# Patient Record
Sex: Male | Born: 1995
Health system: Southern US, Community
[De-identification: ages and names within clinical notes are randomized; demographics above are authoritative.]

## PROBLEM LIST (undated history)

## (undated) DIAGNOSIS — N049 Nephrotic syndrome with unspecified morphologic changes: Secondary | ICD-10-CM

## (undated) DIAGNOSIS — N189 Chronic kidney disease, unspecified: Secondary | ICD-10-CM

## (undated) HISTORY — PX: TONSILLECTOMY: SUR1361

---
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
## 2016-11-19 ENCOUNTER — Emergency Department (HOSPITAL_COMMUNITY): Payer: Self-pay

## 2016-11-19 ENCOUNTER — Emergency Department (HOSPITAL_COMMUNITY)
Admission: EM | Admit: 2016-11-19 | Discharge: 2016-11-19 | Disposition: A | Discharge status: Home / Self Care | Payer: Self-pay | Attending: Physician Assistant | Admitting: Physician Assistant

## 2016-11-19 DIAGNOSIS — S62337A Displaced fracture of neck of fifth metacarpal bone, left hand, initial encounter for closed fracture: Secondary | ICD-10-CM | POA: Insufficient documentation

## 2016-11-19 DIAGNOSIS — Y999 Unspecified external cause status: Secondary | ICD-10-CM | POA: Insufficient documentation

## 2016-11-19 DIAGNOSIS — F17200 Nicotine dependence, unspecified, uncomplicated: Secondary | ICD-10-CM | POA: Insufficient documentation

## 2016-11-19 DIAGNOSIS — W2201XA Walked into wall, initial encounter: Secondary | ICD-10-CM | POA: Insufficient documentation

## 2016-11-19 DIAGNOSIS — Y929 Unspecified place or not applicable: Secondary | ICD-10-CM | POA: Insufficient documentation

## 2016-11-19 DIAGNOSIS — Y939 Activity, unspecified: Secondary | ICD-10-CM | POA: Insufficient documentation

## 2016-11-19 MED ORDER — OXYCODONE-ACETAMINOPHEN 5-325 MG PO TABS
1.0000 | ORAL_TABLET | Freq: Once | ORAL | Status: AC
  Administered 2016-11-19: 1 via ORAL
  Filled 2016-11-19: qty 1

## 2016-11-19 MED ORDER — TETANUS-DIPHTH-ACELL PERTUSSIS 5-2.5-18.5 LF-MCG/0.5 IM SUSP
0.5000 mL | Freq: Once | INTRAMUSCULAR | Status: DC
  Filled 2016-11-19: qty 0.5

## 2016-11-19 NOTE — ED Triage Notes (Signed)
Pt states he punched a wall a few days ago and now has swelling to his right hand. Pulse and sensation intact.

## 2016-11-19 NOTE — ED Notes (Signed)
Patient returned from xray.

## 2016-11-19 NOTE — Discharge Instructions (Signed)
Call for hand surgery follow up. Return with any concerns.

## 2016-11-19 NOTE — Progress Notes (Signed)
Orthopedic Tech Progress Note Patient Details:  Philip Trevino 16-Oct-1995 [id number]  Ortho Devices Type of Ortho Device: Ace wrap, Ulna gutter splint Ortho Device/Splint Location: RUE Ortho Device/Splint Interventions: Ordered, Application   Braulio Bosch 11/19/2016, 3:22 PM

## 2016-11-19 NOTE — ED Provider Notes (Signed)
[facility] DEPT Provider Note   CSN: [id number] Arrival date & time: 11/19/16  1310  By signing my name below, I, Levester Fresh, attest that this documentation has been prepared under the direction and in the presence of No att. providers found.  Electronically Signed: Levester Fresh, Scribe. 11/19/2016. 4:59 PM.  History   Chief Complaint Chief Complaint  Patient presents with  . Hand Pain   Philip Trevino is a 21 y.o. male who presents to the Emergency Department with complaints of right hand pain after punching a wall x3 days ago. Pt denies experiencing any other acute sx, including numbness or tingling. Pt is able to move fingers.   The history is provided by the patient. No language interpreter was used.   No past medical history on file.  There are no active problems to display for this patient.  No past surgical history on file.   Home Medications    Prior to Admission medications   Medication Sig Start Date End Date Taking? Authorizing Provider  ibuprofen (ADVIL,MOTRIN) 600 MG tablet Take 1 tab PO Q6h x 1-2 days then Q6h prn 10/16/13   Kristen Cardinal, NP    Family History No family history on file.  Social History Social History  Substance Use Topics  . Smoking status: Light Tobacco Smoker  . Smokeless tobacco: Not on file  . Alcohol use No     Allergies   Patient has no known allergies.   Review of Systems Review of Systems  Musculoskeletal: Positive for arthralgias and myalgias.  Neurological: Negative for numbness.     Physical Exam Updated Vital Signs BP (!) 106/58 (BP Location: Left Arm)   Pulse 62   Temp 98.1 F (36.7 C) (Oral)   Resp 16   SpO2 100%   Physical Exam  Constitutional: He is oriented to person, place, and time. He appears well-developed and well-nourished. No distress.  HENT:  Head: Normocephalic and atraumatic.  Cardiovascular: Normal rate.   Pulmonary/Chest: Effort normal.  Musculoskeletal:  Swelling to the right  4th and 5th digits. Fingers are neurovascularly intact.  Neurological: He is alert and oriented to person, place, and time.  Skin: Skin is warm and dry.  Psychiatric: He has a normal mood and affect.  Nursing note and vitals reviewed.  ED Treatments / Results  DIAGNOSTIC STUDIES: Oxygen Saturation is 100% on RA, nl by my interpretation.    COORDINATION OF CARE: 2:12 PM Discussed treatment plan with pt at bedside and pt agreed to plan. 2:15 PM Called for hand consult. 3:01 PM Called to follow-up with hand consult.  Labs (all labs ordered are listed, but only abnormal results are displayed) Labs Reviewed - No data to display  EKG  EKG Interpretation None      Radiology Dg Hand Complete Right  Result Date: 11/19/2016 CLINICAL DATA:  Pain after trauma. EXAM: RIGHT HAND - COMPLETE 3+ VIEW COMPARISON:  None. FINDINGS: There is an angulated mildly displaced fracture of the distal fifth metacarpal. IMPRESSION: Angulated mildly displaced fracture of the distal fifth metacarpal. Electronically Signed   By: Dorise Bullion III M.D   On: 11/19/2016 14:08    Procedures Procedures (including critical care time)  Medications Ordered in ED Medications  Tdap (BOOSTRIX) injection 0.5 mL (0.5 mLs Intramuscular Refused 11/19/16 1514)  oxyCODONE-acetaminophen (PERCOCET/ROXICET) 5-325 MG per tablet 1 tablet (1 tablet Oral Given 11/19/16 1514)    Initial Impression / Assessment and Plan / ED Course  I have reviewed the triage vital  signs and the nursing notes.  Pertinent labs & imaging results that were available during my care of the patient were reviewed by me and considered in my medical decision making (see chart for details).    I personally performed the services described in this documentation, which was scribed in my presence. The recorded information has been reviewed and is accurate.   Patient is well-appearing 21 year old male who patent punched a wall recently. Patient has swelling to  the fourth and fifth MCP. Suspect fracture. We'll imagine. Denies punching mouth.  Patient shows right fifth metacarpal fracture. Discussed with Dr. Lenon Curt was on hand. He recommends splint and follow-up. Patient has no abrasions or skin tears and denies fight bite,.      Final Clinical Impressions(s) / ED Diagnoses   Final diagnoses:  Closed displaced fracture of neck of fifth metacarpal bone of left hand, initial encounter    New Prescriptions Discharge Medication List as of 11/19/2016  3:56 PM       Jeffie Widdowson Julio Alm, MD 11/19/16 1659

## 2016-11-19 NOTE — ED Notes (Signed)
Patient states he punched a wall Sat . c/o pain and swelling in right hand. States he had been using ice without relief.

## 2016-11-19 NOTE — ED Notes (Signed)
Ortho paged. 

## 2017-05-01 ENCOUNTER — Encounter (HOSPITAL_COMMUNITY): Payer: Self-pay

## 2017-05-01 ENCOUNTER — Emergency Department (HOSPITAL_COMMUNITY)
Admission: EM | Admit: 2017-05-01 | Discharge: 2017-05-01 | Disposition: A | Discharge status: Home / Self Care | Payer: Self-pay | Attending: Emergency Medicine | Admitting: Emergency Medicine

## 2017-05-01 DIAGNOSIS — F17200 Nicotine dependence, unspecified, uncomplicated: Secondary | ICD-10-CM | POA: Insufficient documentation

## 2017-05-01 DIAGNOSIS — J069 Acute upper respiratory infection, unspecified: Secondary | ICD-10-CM | POA: Insufficient documentation

## 2017-05-01 NOTE — ED Provider Notes (Signed)
[facility] DEPT Provider Note   CSN: [id number] Arrival date & time: 05/01/17  1523     History   Chief Complaint Chief Complaint  Patient presents with  . URI    HPI Philip Trevino is a 21 y.o. male.  HPI   21 year old male presents today with complaints of nasal congestion.  Patient reports that Tuesday he developed nasal congestion, headache described as posterior that has remained persistent.  Patient notes fevers Tuesday, now having fevers in the evenings, none presently.  He denies any cough, sore throat, chest pain or shortness of breath.  Denies any abdominal pain nausea or vomiting.  Denies any rash.  No neck stiffness.  No other concerning signs or symptoms reported today.  She denies a history of asthma or use.  History reviewed. No pertinent past medical history.  There are no active problems to display for this patient.   History reviewed. No pertinent surgical history.     Home Medications    Prior to Admission medications   Medication Sig Start Date End Date Taking? Authorizing Provider  ibuprofen (ADVIL,MOTRIN) 600 MG tablet Take 1 tab PO Q6h x 1-2 days then Q6h prn 10/16/13   Kristen Cardinal, NP    Family History History reviewed. No pertinent family history.  Social History Social History  Substance Use Topics  . Smoking status: Light Tobacco Smoker  . Smokeless tobacco: Never Used  . Alcohol use No     Allergies   Patient has no known allergies.   Review of Systems Review of Systems  All other systems reviewed and are negative.    Physical Exam Updated Vital Signs BP 120/81 (BP Location: Left Arm)   Pulse 74   Temp 98.1 F (36.7 C) (Oral)   Resp 17   SpO2 100%   Physical Exam  Constitutional: He is oriented to person, place, and time. He appears well-developed and well-nourished.  HENT:  Head: Normocephalic and atraumatic.  Right Ear: Hearing, tympanic membrane, external ear and ear canal normal.  Left Ear: Hearing,  tympanic membrane, external ear and ear canal normal.  Mouth/Throat: Uvula is midline and oropharynx is clear and moist. No oral lesions. No trismus in the jaw. Normal dentition. No uvula swelling. No oropharyngeal exudate, posterior oropharyngeal edema, posterior oropharyngeal erythema or tonsillar abscesses.  Eyes: Pupils are equal, round, and reactive to light. Conjunctivae are normal. Right eye exhibits no discharge. Left eye exhibits no discharge. No scleral icterus.  Neck: Normal range of motion. No JVD present. No tracheal deviation present.  Cardiovascular: Normal rate, normal heart sounds and intact distal pulses.  Exam reveals no gallop and no friction rub.   No murmur heard. Pulmonary/Chest: Effort normal and breath sounds normal. No stridor. No respiratory distress. He has no wheezes. He has no rales. He exhibits no tenderness.  Neurological: He is alert and oriented to person, place, and time. Coordination normal.  Psychiatric: He has a normal mood and affect. His behavior is normal. Judgment and thought content normal.  Nursing note and vitals reviewed.    ED Treatments / Results  Labs (all labs ordered are listed, but only abnormal results are displayed) Labs Reviewed - No data to display  EKG  EKG Interpretation None       Radiology No results found.  Procedures Procedures (including critical care time)  Medications Ordered in ED Medications - No data to display   Initial Impression / Assessment and Plan / ED Course  I have reviewed the triage vital  signs and the nursing notes.  Pertinent labs & imaging results that were available during my care of the patient were reviewed by me and considered in my medical decision making (see chart for details).      Final Clinical Impressions(s) / ED Diagnoses   Final diagnoses:  Viral upper respiratory tract infection    Labs:   Imaging:  Consults:  Therapeutics:  Discharge Meds:   Assessment/Plan:    21 year old male presents today with likely viral URI.  He is well-appearing in no acute distress, afebrile.  Symptomatic care instructions given strict return precautions given.  He verbalized understanding and agreement to today's plan had no further questions or concerns at time discharge.      New Prescriptions New Prescriptions   No medications on file     Okey Regal, Hershal Coria 05/01/17 1810    Okey Regal, PA-C 05/01/17 1811    Sherwood Gambler, MD 05/02/17 [phone]

## 2017-05-01 NOTE — ED Triage Notes (Signed)
Pt reports URI symptoms, generalized body aches, headache, chills and possible fever X2 days. Denies sore throat. Reports some nasal congestion. Resp e/u, no distress.

## 2017-05-01 NOTE — Discharge Instructions (Signed)
Please read attached information. If you experience any new or worsening signs or symptoms please return to the emergency room for evaluation. Please follow-up with your primary care provider or specialist as discussed.  Please use the Nettie pot as needed for nasal congestion.

## 2017-05-01 NOTE — ED Notes (Signed)
Patient complaining of nasal congestion, headache, chills, and body aches.  Denies cough, sore throat.  No respiratory distress. Afebrile at the time.

## 2017-05-19 ENCOUNTER — Emergency Department (HOSPITAL_COMMUNITY): Payer: Self-pay

## 2017-05-19 ENCOUNTER — Emergency Department (HOSPITAL_COMMUNITY)
Admission: EM | Admit: 2017-05-19 | Discharge: 2017-05-19 | Disposition: A | Discharge status: Home / Self Care | Payer: Self-pay | Attending: Emergency Medicine | Admitting: Emergency Medicine

## 2017-05-19 ENCOUNTER — Encounter (HOSPITAL_COMMUNITY): Payer: Self-pay | Admitting: Emergency Medicine

## 2017-05-19 DIAGNOSIS — Z79899 Other long term (current) drug therapy: Secondary | ICD-10-CM | POA: Insufficient documentation

## 2017-05-19 DIAGNOSIS — F17210 Nicotine dependence, cigarettes, uncomplicated: Secondary | ICD-10-CM | POA: Insufficient documentation

## 2017-05-19 DIAGNOSIS — R222 Localized swelling, mass and lump, trunk: Secondary | ICD-10-CM | POA: Insufficient documentation

## 2017-05-19 NOTE — ED Triage Notes (Signed)
Pt to ER for evaluation of right chest pain, states "there is a lump in my muscle," states has been bothering him for some time but has gotten worse. States pain with palpation and movement. All VSS. A/o x4. NAD.

## 2017-05-19 NOTE — Discharge Instructions (Signed)
An order has been placed in the computer for an ultrasound of the right side of the chest.  This will be done at [facility].  Call the number provided to set up an appointment. Please go to the primary care appointment set up for you at the [facility] family medicine center on November 2 at 10 AM. You will not get your results from the ultrasound until at least this visit. Please call the family medicine center after you get the ultrasound to make sure they know to look for that imaging result.   May take ibuprofen, naproxen, or Tylenol for pain.

## 2017-05-19 NOTE — Discharge Planning (Signed)
Murrell Dome J. Clydene Laming, RN, BSN, [place]  [facility] set up appointment with Philip Fail, PA-C at [facility] on 11/2 @10 :00.  Spoke with pt at bedside and advised to please arrive 15 min early and take a picture ID and your current medications.  Pt verbalizes understanding of keeping appointment.

## 2017-05-19 NOTE — ED Notes (Signed)
Case mx  Finding pt a PCP so  They can f/u with the [place]

## 2017-05-19 NOTE — ED Provider Notes (Signed)
[facility] EMERGENCY DEPARTMENT Provider Note   CSN: [id number] Arrival date & time: 05/19/17  7628     History   Chief Complaint Chief Complaint  Patient presents with  . Chest Pain    Pectoral lump?    HPI Philip Trevino is a 21 y.o. male.  HPI   Philip Trevino is a 21 y.o. male, patient with no pertinent past medical history, presenting to the ED with a painful area in the right pectoral region. States he first noticed it around January 2018. States it has been slowly growing since then. Became tender to the touch last night.  Denies fever/chills, N/V, night sweats, shortness of breath, cough, nipple discharge, changes in skin texture, or any other complaints. Denies anabolic steroid use, hormonal therapy use, IV drug use, or HIV.  No personal history of cancer.  History reviewed. No pertinent past medical history.  There are no active problems to display for this patient.   History reviewed. No pertinent surgical history.     Home Medications    Prior to Admission medications   Medication Sig Start Date End Date Taking? Authorizing Provider  ibuprofen (ADVIL,MOTRIN) 600 MG tablet Take 1 tab PO Q6h x 1-2 days then Q6h prn 10/16/13   Kristen Cardinal, NP    Family History History reviewed. No pertinent family history.  Social History Social History  Substance Use Topics  . Smoking status: Light Tobacco Smoker  . Smokeless tobacco: Never Used  . Alcohol use No     Allergies   Patient has no known allergies.   Review of Systems Review of Systems  Constitutional: Negative for chills, diaphoresis and fever.  Respiratory: Negative for cough and shortness of breath.   Gastrointestinal: Negative for nausea and vomiting.  Skin:       Right pectoral mass     Physical Exam Updated Vital Signs BP 107/70 (BP Location: Right Arm)   Pulse (!) 50   Temp 98.3 F (36.8 C) (Oral)   Resp 18   SpO2 100%   Physical Exam  Constitutional: He  appears well-developed and well-nourished. No distress.  HENT:  Head: Normocephalic and atraumatic.  Eyes: Conjunctivae are normal.  Neck: Neck supple.  Cardiovascular: Normal rate and regular rhythm.   Pulmonary/Chest: Effort normal and breath sounds normal. No respiratory distress.    Musculoskeletal: He exhibits tenderness.  Full range of motion through the cardinal directions of the right shoulder.  Lymphadenopathy:    He has no axillary adenopathy.       Right axillary: No pectoral and no lateral adenopathy present.  Neurological: He is alert.  Skin: Skin is warm and dry. He is not diaphoretic. No pallor.  Psychiatric: He has a normal mood and affect. His behavior is normal.  Nursing note and vitals reviewed.    ED Treatments / Results  Labs (all labs ordered are listed, but only abnormal results are displayed) Labs Reviewed - No data to display  EKG  EKG Interpretation None       Radiology Dg Chest 2 View  Result Date: 05/19/2017 CLINICAL DATA:  Chest pain EXAM: CHEST  2 VIEW COMPARISON:  None FINDINGS: The heart size and mediastinal contours are within normal limits. Both lungs are clear. The visualized skeletal structures are unremarkable. IMPRESSION: No active cardiopulmonary disease. Electronically Signed   By: Kerby Moors M.D.   On: 05/19/2017 10:45    Procedures [place] bedside Date/Time: 05/19/2017 11:02 AM Performed by: Lorayne Bender Authorized by: Caryl Asp  Brysyn Brandenberger C  Consent: Verbal consent obtained. Risks and benefits: risks, benefits and alternatives were discussed Consent given by: patient Patient identity confirmed: verbally with patient and provided demographic data Local anesthesia used: no  Anesthesia: Local anesthesia used: no  Sedation: Patient sedated: no Patient tolerance: Patient tolerated the procedure well with no immediate complications Comments: Superficial ultrasound of the right chest.  No fluid collection or evidence of cellulitis  noted.  There is an abnormal, flat mass noted on the ultrasound measuring approximately 2.5 cm x 2.5 cm.    (including critical care time)  Medications Ordered in ED Medications - No data to display   Initial Impression / Assessment and Plan / ED Course  I have reviewed the triage vital signs and the nursing notes.  Pertinent labs & imaging results that were available during my care of the patient were reviewed by me and considered in my medical decision making (see chart for details).     Patient presents with a painful right superficial chest mass.  Doubt abscess or cellulitis.  Mass is suspicious enough to warrant further imaging.  Order placed for superficial chest ultrasound.  Case management was utilized to set up an appointment for the patient with primary care for further management of this issue and review of imaging.  Instructions for imaging as well as primary care follow-up were explicitly discussed with the patient by myself as well as the case manager.  Patient voiced understanding.  Final Clinical Impressions(s) / ED Diagnoses   Final diagnoses:  Mass of chest wall, right    New Prescriptions New Prescriptions   No medications on file     Layla Maw 05/19/17 1134    Mesner, Corene Cornea, MD 05/20/17 [phone]

## 2017-05-19 NOTE — ED Notes (Signed)
Pt states he has rt breast pain and swelling, , states lifts heavy things at work, states swelling has been going on for a while/ months

## 2017-05-23 ENCOUNTER — Encounter (INDEPENDENT_AMBULATORY_CARE_PROVIDER_SITE_OTHER): Payer: Self-pay | Admitting: Physician Assistant

## 2017-05-23 ENCOUNTER — Ambulatory Visit (INDEPENDENT_AMBULATORY_CARE_PROVIDER_SITE_OTHER): Payer: Self-pay | Admitting: Physician Assistant

## 2017-05-23 VITALS — BP 104/67 | HR 65 | Temp 98.0°F | Resp 18 | Ht 69.0 in | Wt 145.0 lb

## 2017-05-23 DIAGNOSIS — N62 Hypertrophy of breast: Secondary | ICD-10-CM

## 2017-05-23 DIAGNOSIS — Z23 Encounter for immunization: Secondary | ICD-10-CM

## 2017-05-23 NOTE — Progress Notes (Signed)
   Subjective:  Patient ID: Philip Trevino, male    DOB: 1995/09/18  Age: 21 y.o. MRN: [id number]  CC: hospital f/u  HPI Philip Trevino is a 21 y.o. male with a medical history of breast mass presents on ED visit follow up. Went to ED on 04/2917 with complaint of tender breast mass on right nipple. Had been to the ED previously in 2016 with the same complaint. Believes the mass may be growing. Mass is tender to palpation. Does not endorse nipple discharge, malaise, fever, weight loss, or night sweats. Had an [place] ordered but has not been able to have it done due to financial issues.     Outpatient Medications Prior to Visit  Medication Sig Dispense Refill  . ibuprofen (ADVIL,MOTRIN) 600 MG tablet Take 1 tab PO Q6h x 1-2 days then Q6h prn 30 tablet 0   No facility-administered medications prior to visit.      ROS Review of Systems  Constitutional: Negative for chills, fever and malaise/fatigue.  Eyes: Negative for blurred vision.  Respiratory: Negative for shortness of breath.   Cardiovascular: Negative for chest pain and palpitations.  Gastrointestinal: Negative for abdominal pain and nausea.  Genitourinary: Negative for dysuria and hematuria.  Musculoskeletal: Negative for joint pain and myalgias.  Skin: Negative for rash.  Neurological: Negative for tingling and headaches.  Psychiatric/Behavioral: Negative for depression. The patient is not nervous/anxious.     Objective:  BP 104/67 (BP Location: Right Arm, Patient Position: Sitting, Cuff Size: Normal)   Pulse 65   Temp 98 F (36.7 C) (Oral)   Resp 18   Ht 5\' 9"  (1.753 m)   Wt 145 lb (65.8 kg)   SpO2 97%   BMI 21.41 kg/m   BP/Weight 05/23/2017 05/19/2017 25/63/8937  Systolic BP 342 876 811  Diastolic BP 67 80 80  Wt. (Lbs) 145 - -  BMI 21.41 - -      Physical Exam  Constitutional: He is oriented to person, place, and time.  Well developed, well nourished, NAD, polite  HENT:  Head: Normocephalic and atraumatic.   Eyes: No scleral icterus.  Neck: Normal range of motion. Neck supple. No thyromegaly present.  Pulmonary/Chest: Effort normal.  Musculoskeletal: He exhibits no edema.  Neurological: He is alert and oriented to person, place, and time.  Skin: Skin is warm and dry. No rash noted. No erythema. No pallor.  Firm, rubbery, freely movable nodule under right nipple, approximately 2 cm in size  Psychiatric: He has a normal mood and affect. His behavior is normal. Thought content normal.  Vitals reviewed.    Assessment & Plan:     1. Gynecomastia - Testosterone; Future - FSH/LH; Future - HCG, Tumor Marker; Future - [place] CHEST SOFT TISSUE; Future  2. Need for prophylactic vaccination and inoculation against influenza - Flu Vaccine QUAD 6+ mos PF IM (Fluarix Quad PF)  3. Need for Tdap vaccination - Tdap vaccine greater than or equal to 7yo IM    Follow-up: Return if symptoms worsen or fail to improve.   Clent Demark PA

## 2017-05-23 NOTE — Patient Instructions (Signed)
Gynecomastia, Adult Gynecomastia is an overgrowth of gland tissue in a man's breasts. This may cause one or both breasts to become enlarged. This often develops in men who have an imbalance of the male sex hormone (testosterone) and the male sex hormone (estrogen). This means that a man may have too much estrogen, too little testosterone, or both. Gynecomastia may be a normal part of aging for some men. It can also happen to adolescent boys during puberty. What are the causes? Gynecomastia may be caused by:  Certain medicines, such as: ? Estrogen supplements and medicines that act like estrogen in the body. ? Medicines that keep testosterone from functioning normally in the body (testosterone-inhibiting drugs). ? Anabolic steroids. ? Medicines to treat heartburn, cancer, heart disease, mental health problems, HIV (human immunodeficiency virus) or AIDS (acquired immunodeficiency syndrome). ? Antibiotic medicine. ? Chemotherapy medicine.  Recreational drugs, including alcohol, marijuana, and opioids.  Herbal products, including lavender and tea tree oil.  A gene that is passed along from parent to child (inherited).  Tumors in the pituitary or adrenal gland.  An overactive thyroid gland.  Certain inherited disorders, including a genetic disease that causes low testosterone in males (Klinefelter syndrome).  Cancer of the lung, kidney, liver, testicle, or gastrointestinal tract.  Conditions that cause liver or kidney failure.  Poor nutrition and starvation.  Testicle shrinking or failure (testicularatrophy).  In some cases, the cause may not be known. What increases the risk? You may have a higher risk for gynecomastia if you:  Are 26 years old or older.  Are overweight.  Abuse alcohol or other drugs.  Have a family history of gynecomastia.  What are the signs or symptoms?  Most of the time, breast enlargement is the only symptom. The enlargement may start near the  nipple, and the breast tissue may feel firm and rubbery. The breast may feel itchy, painful or tender. How is this diagnosed? This condition may be diagnosed based on:  Your symptoms.  Your medical history.  A physical exam.  Imaging tests, such as: ? An ultrasound. ? A mammogram. ? An MRI.  Blood tests.  Removal of a sample of breast tissue to be tested in a lab (biopsy).  How is this treated? Gynecomastia may go away on its own, without treatment. If gynecomastia is caused by a medical problem or drug abuse, treatment may include:  Getting treatment for the underlying medical problem or for drug abuse.  Changing or stopping medicines.  Medicines to block the effects of estrogen.  Taking a testosterone replacement.  Surgery to remove breast tissue or any lumps in your breasts.  Breast reduction surgery. This may be a possibility if you have severe or painful gynecomastia.  Follow these instructions at home:  Take over-the-counter and prescription medicines only as told by your health care provider.  Talk to your health care provider before taking any herbal medicines or diet supplements.  Do not abuse drugs or alcohol.  Keep all follow-up visits as told by your health care provider. This is important. Contact a health care provider if:  Your breast tissue grows larger or gets more swollen or painful.  You have a lump in your testicle.  You have blood or discharge coming from your nipples.  Your nipple changes shape.  You develop a hard or painful lump in your breast. This information is not intended to replace advice given to you by your health care provider. Make sure you discuss any questions you have with your  health care provider. Document Released: 09/01/2015 Document Revised: 12/15/2015 Document Reviewed: 09/01/2015 Elsevier Interactive Patient Education  [employer].

## 2017-05-28 ENCOUNTER — Ambulatory Visit (HOSPITAL_COMMUNITY): Payer: Self-pay

## 2017-08-06 ENCOUNTER — Emergency Department (HOSPITAL_COMMUNITY)
Admission: EM | Admit: 2017-08-06 | Discharge: 2017-08-06 | Disposition: A | Discharge status: Home / Self Care | Payer: Self-pay | Attending: Emergency Medicine | Admitting: Emergency Medicine

## 2017-08-06 ENCOUNTER — Encounter (HOSPITAL_COMMUNITY): Payer: Self-pay | Admitting: Emergency Medicine

## 2017-08-06 DIAGNOSIS — Z79899 Other long term (current) drug therapy: Secondary | ICD-10-CM | POA: Insufficient documentation

## 2017-08-06 DIAGNOSIS — K30 Functional dyspepsia: Secondary | ICD-10-CM

## 2017-08-06 DIAGNOSIS — F17200 Nicotine dependence, unspecified, uncomplicated: Secondary | ICD-10-CM | POA: Insufficient documentation

## 2017-08-06 DIAGNOSIS — R1013 Epigastric pain: Secondary | ICD-10-CM | POA: Insufficient documentation

## 2017-08-06 LAB — COMPREHENSIVE METABOLIC PANEL
ALK PHOS: 55 U/L (ref 38–126)
ALT: 21 U/L (ref 17–63)
ANION GAP: 8 (ref 5–15)
AST: 24 U/L (ref 15–41)
Albumin: 3.9 g/dL (ref 3.5–5.0)
BUN: 8 mg/dL (ref 6–20)
CALCIUM: 8.9 mg/dL (ref 8.9–10.3)
CHLORIDE: 107 mmol/L (ref 101–111)
CO2: 25 mmol/L (ref 22–32)
Creatinine, Ser: 1.06 mg/dL (ref 0.61–1.24)
GFR calc Af Amer: >60 mL/min (ref >60)
GFR calc non Af Amer: >60 mL/min (ref >60)
Glucose, Bld: 88 mg/dL (ref 65–99)
Potassium: 4.3 mmol/L (ref 3.5–5.1)
SODIUM: 140 mmol/L (ref 135–145)
Total Bilirubin: 0.6 mg/dL (ref 0.3–1.2)
Total Protein: 6.1 g/dL — ABNORMAL LOW (ref 6.5–8.1)

## 2017-08-06 LAB — CBC
HCT: 43.3 % (ref 39.0–52.0)
Hemoglobin: 15.5 g/dL (ref 13.0–17.0)
MCH: 30.6 pg (ref 26.0–34.0)
MCHC: 35.8 g/dL (ref 30.0–36.0)
MCV: 85.6 fL (ref 78.0–100.0)
Platelets: 290 10*3/uL (ref 150–400)
RBC: 5.06 MIL/uL (ref 4.22–5.81)
RDW: 12.1 % (ref 11.5–15.5)
WBC: 8.5 10*3/uL (ref 4.0–10.5)

## 2017-08-06 LAB — LIPASE, BLOOD: LIPASE: 26 U/L (ref 11–51)

## 2017-08-06 MED ORDER — GI COCKTAIL ~~LOC~~
30.0000 mL | Freq: Once | ORAL | Status: AC
  Administered 2017-08-06: 30 mL via ORAL
  Filled 2017-08-06: qty 30

## 2017-08-06 MED ORDER — FAMOTIDINE 20 MG PO TABS: 20.0000 mg | ORAL_TABLET | Freq: Two times a day (BID) | ORAL | 0 refills | Status: DC

## 2017-08-06 NOTE — ED Provider Notes (Signed)
[facility] EMERGENCY DEPARTMENT Provider Note   CSN: [id number] Arrival date & time: 08/06/17  2103     History   Chief Complaint Chief Complaint  Patient presents with  . Abdominal Pain    HPI Philip Trevino is a 22 y.o. male without significant past medical history, presenting to the ED with acute onset of intermittent epigastric abdominal pain, described as a bloating and gas feeling that began this morning.  Patient reports associated belching, decreased appetite, and an episode of nonbloody, nonbilious emesis.  Patient states he has been eating more spicy and greasy/fatty foods lately.  No medications tried for symptoms.  No history of PUD.  No history of abdominal surgeries.  Reports only occasional alcohol use, but does not take NSAIDs, and smokes black and milds daily.   The history is provided by the patient.    History reviewed. No pertinent past medical history.  There are no active problems to display for this patient.   History reviewed. No pertinent surgical history.     Home Medications    Prior to Admission medications   Medication Sig Start Date End Date Taking? Authorizing Provider  famotidine (PEPCID) 20 MG tablet Take 1 tablet (20 mg total) by mouth 2 (two) times daily. 08/06/17   Uchechukwu Dhawan, [place] N, PA-C  ibuprofen (ADVIL,MOTRIN) 600 MG tablet Take 1 tab PO Q6h x 1-2 days then Q6h prn 10/16/13   Kristen Cardinal, NP    Family History No family history on file.  Social History Social History   Tobacco Use  . Smoking status: Light Tobacco Smoker  . Smokeless tobacco: Never Used  Substance Use Topics  . Alcohol use: No  . Drug use: No     Allergies   Patient has no known allergies.   Review of Systems Review of Systems  Constitutional: Positive for appetite change. Negative for fever.  Gastrointestinal: Positive for abdominal pain and vomiting. Negative for blood in stool, constipation and nausea.  Genitourinary: Negative  for dysuria.  All other systems reviewed and are negative.    Physical Exam Updated Vital Signs BP 108/72 (BP Location: Right Arm)   Pulse 60   Temp 97.7 F (36.5 C) (Oral)   Resp 16   SpO2 98%   Physical Exam  Constitutional: He appears well-developed and well-nourished.  Non-toxic appearance. He does not appear ill. No distress.  HENT:  Head: Normocephalic and atraumatic.  Mouth/Throat: Oropharynx is clear and moist.  Eyes: Conjunctivae are normal.  Cardiovascular: Normal rate, regular rhythm, normal heart sounds and intact distal pulses.  Pulmonary/Chest: Effort normal and breath sounds normal.  Abdominal: Soft. Normal appearance and bowel sounds are normal. He exhibits no distension and no mass. There is tenderness (mild) in the epigastric area. There is no rigidity, no rebound, no guarding and negative Murphy's sign.  Neurological: He is alert.  Skin: Skin is warm.  Psychiatric: He has a normal mood and affect. His behavior is normal.  Nursing note and vitals reviewed.    ED Treatments / Results  Labs (all labs ordered are listed, but only abnormal results are displayed) Labs Reviewed  COMPREHENSIVE METABOLIC PANEL - Abnormal; Notable for the following components:      Result Value   Total Protein 6.1 (*)    All other components within normal limits  LIPASE, BLOOD  CBC  URINALYSIS, ROUTINE W REFLEX MICROSCOPIC    EKG  EKG Interpretation None       Radiology No results found.  Procedures  Procedures (including critical care time)  Medications Ordered in ED Medications  gi cocktail (Maalox,Lidocaine,Donnatal) (30 mLs Oral Given 08/06/17 2333)     Initial Impression / Assessment and Plan / ED Course  I have reviewed the triage vital signs and the nursing notes.  Pertinent labs & imaging results that were available during my care of the patient were reviewed by me and considered in my medical decision making (see chart for details).    Patient with  symptoms consistent with GERD vs gastritis.  Likely related to diet.  Vitals are stable, no fever or tachycardia.  Patient is nontoxic, nonseptic appearing, in no apparent distress.  Patient does not meet the SIRS or Sepsis criteria.  Pt's symptoms have been managed in the department..  No signs of dehydration, tolerating PO fluids > 6 oz.  Lungs are clear.  No focal abdominal pain, no peritoneal signs, no concern for appendicitis, cholecystitis, pancreatitis, ruptured viscus, UTI, kidney stone. Labs wnl. GI cocktail given in ED. Supportive therapy indicated, rx for pepcid given.  Patient counseled, expresses understanding and agrees with plan.  Discussed results, findings, treatment and follow up. Patient advised of return precautions. Patient verbalized understanding and agreed with plan.   Final Clinical Impressions(s) / ED Diagnoses   Final diagnoses:  Stomach upset    ED Discharge Orders        Ordered    famotidine (PEPCID) 20 MG tablet  2 times daily     08/06/17 2334       Anuar Walgren, [place] N, PA-C 08/06/17 2338    Margette Fast, MD 08/07/17 [phone]

## 2017-08-06 NOTE — Discharge Instructions (Signed)
Please read instructions below. Avoid acidic foods, spicy foods, greasy foods, and medicines such as Advil, ibuprofen, Aleve, aspirin, Goody's Powder.  All these things can irritate your stomach lining. Begin taking Pepcid every 12 hours, for stomach upset. Follow-up with your primary care provider symptoms persist. Return to the ER for severely worsening abdominal pain, fever, if you are unable to keep liquids down, or new or concerning symptoms.

## 2017-08-06 NOTE — ED Triage Notes (Signed)
Patient reports intermittent mid abdominal pain with emesis x1 this morning , denies fever or diarrhea .

## 2017-08-26 ENCOUNTER — Other Ambulatory Visit: Payer: Self-pay

## 2017-08-26 DIAGNOSIS — F17210 Nicotine dependence, cigarettes, uncomplicated: Secondary | ICD-10-CM | POA: Insufficient documentation

## 2017-08-26 DIAGNOSIS — R1013 Epigastric pain: Secondary | ICD-10-CM | POA: Insufficient documentation

## 2017-08-26 DIAGNOSIS — Z79899 Other long term (current) drug therapy: Secondary | ICD-10-CM | POA: Insufficient documentation

## 2017-08-26 NOTE — ED Triage Notes (Signed)
Pt reports upper abdominal pain for about 12 hours, states diarrhea earlier but is feeling better. Denies nausea/vomiting and urinary s/s.

## 2017-08-27 ENCOUNTER — Emergency Department (HOSPITAL_COMMUNITY): Payer: Self-pay

## 2017-08-27 ENCOUNTER — Encounter (HOSPITAL_COMMUNITY): Payer: Self-pay | Admitting: *Deleted

## 2017-08-27 ENCOUNTER — Emergency Department (HOSPITAL_COMMUNITY)
Admission: EM | Admit: 2017-08-27 | Discharge: 2017-08-27 | Disposition: A | Discharge status: Home / Self Care | Payer: Self-pay | Attending: Emergency Medicine | Admitting: Emergency Medicine

## 2017-08-27 DIAGNOSIS — R1013 Epigastric pain: Secondary | ICD-10-CM

## 2017-08-27 DIAGNOSIS — R109 Unspecified abdominal pain: Secondary | ICD-10-CM

## 2017-08-27 LAB — URINALYSIS, ROUTINE W REFLEX MICROSCOPIC
BILIRUBIN URINE: NEGATIVE
GLUCOSE, UA: NEGATIVE mg/dL
Hgb urine dipstick: NEGATIVE
KETONES UR: NEGATIVE mg/dL
LEUKOCYTES UA: NEGATIVE
NITRITE: NEGATIVE
PROTEIN: NEGATIVE mg/dL
Specific Gravity, Urine: 1.021 (ref 1.005–1.030)
pH: 6 (ref 5.0–8.0)

## 2017-08-27 LAB — COMPREHENSIVE METABOLIC PANEL
ALT: 11 U/L — AB (ref 17–63)
AST: 19 U/L (ref 15–41)
Albumin: 3.8 g/dL (ref 3.5–5.0)
Alkaline Phosphatase: 60 U/L (ref 38–126)
Anion gap: 9 (ref 5–15)
BILIRUBIN TOTAL: 0.5 mg/dL (ref 0.3–1.2)
BUN: 10 mg/dL (ref 6–20)
CO2: 25 mmol/L (ref 22–32)
CREATININE: 1.03 mg/dL (ref 0.61–1.24)
Calcium: 9 mg/dL (ref 8.9–10.3)
Chloride: 105 mmol/L (ref 101–111)
GFR calc Af Amer: >60 mL/min (ref >60)
GLUCOSE: 95 mg/dL (ref 65–99)
Potassium: 3.8 mmol/L (ref 3.5–5.1)
Sodium: 139 mmol/L (ref 135–145)
TOTAL PROTEIN: 6.1 g/dL — AB (ref 6.5–8.1)

## 2017-08-27 LAB — CBC
HCT: 39.1 % (ref 39.0–52.0)
Hemoglobin: 13.8 g/dL (ref 13.0–17.0)
MCH: 30.4 pg (ref 26.0–34.0)
MCHC: 35.3 g/dL (ref 30.0–36.0)
MCV: 86.1 fL (ref 78.0–100.0)
PLATELETS: 248 10*3/uL (ref 150–400)
RBC: 4.54 MIL/uL (ref 4.22–5.81)
RDW: 12.3 % (ref 11.5–15.5)
WBC: 7.4 10*3/uL (ref 4.0–10.5)

## 2017-08-27 LAB — LIPASE, BLOOD: Lipase: 26 U/L (ref 11–51)

## 2017-08-27 MED ORDER — GI COCKTAIL ~~LOC~~
30.0000 mL | Freq: Once | ORAL | Status: AC
  Administered 2017-08-27: 30 mL via ORAL
  Filled 2017-08-27: qty 30

## 2017-08-27 MED ORDER — SUCRALFATE 1 G PO TABS: 1.0000 g | ORAL_TABLET | Freq: Three times a day (TID) | ORAL | 0 refills | Status: DC

## 2017-08-27 MED ORDER — IOPAMIDOL (ISOVUE-300) INJECTION 61%
INTRAVENOUS | Status: AC
  Filled 2017-08-27: qty 100

## 2017-08-27 MED ORDER — DICYCLOMINE HCL 10 MG PO CAPS
10.0000 mg | ORAL_CAPSULE | Freq: Once | ORAL | Status: AC
  Administered 2017-08-27: 10 mg via ORAL
  Filled 2017-08-27: qty 1

## 2017-08-27 MED ORDER — FAMOTIDINE 20 MG PO TABS: 20.0000 mg | ORAL_TABLET | Freq: Two times a day (BID) | ORAL | 0 refills | Status: DC

## 2017-08-27 NOTE — ED Provider Notes (Signed)
[facility] EMERGENCY DEPARTMENT Provider Note   CSN: [id number] Arrival date & time: 08/26/17  2345     History   Chief Complaint Chief Complaint  Patient presents with  . Abdominal Pain    HPI Philip Trevino is a 22 y.o. male.  HPI Philip Trevino is a 22 y.o. male with no medical problems, presents to emergency department complaining of abdominal pain diarrhea.  Patient states that he developed epigastric abdominal pain radiating all over yesterday afternoon.  He states he thought maybe he was hungry and ate a hamburger and a hot dog.  He states symptoms got worse so he came to emergency department.  He reports associated nausea, no vomiting.  He states he had a few episodes of diarrhea while in the waiting room.  Denies any blood in his stool.  Reports history of similar pain in the past, with no diagnosis.  Denies any urinary symptoms.  Did not take any medications prior to coming in.  Denies alcohol consumption.  Denies excessive use of NSAIDs.  No prior abdominal issues or surgeries.  He states nothing is making his symptoms better or worse.  Pain is sharp, constant.  No past medical history on file.  There are no active problems to display for this patient.   No past surgical history on file.     Home Medications    Prior to Admission medications   Medication Sig Start Date End Date Taking? Authorizing Provider  famotidine (PEPCID) 20 MG tablet Take 1 tablet (20 mg total) by mouth 2 (two) times daily. 08/06/17   Robinson, [place] N, PA-C  ibuprofen (ADVIL,MOTRIN) 600 MG tablet Take 1 tab PO Q6h x 1-2 days then Q6h prn 10/16/13   Kristen Cardinal, NP    Family History No family history on file.  Social History Social History   Tobacco Use  . Smoking status: Light Tobacco Smoker  . Smokeless tobacco: Never Used  Substance Use Topics  . Alcohol use: No  . Drug use: No     Allergies   Patient has no known allergies.   Review of Systems Review of  Systems  Constitutional: Negative for chills and fever.  Respiratory: Negative for cough, chest tightness and shortness of breath.   Cardiovascular: Negative for chest pain, palpitations and leg swelling.  Gastrointestinal: Positive for abdominal pain, diarrhea and nausea. Negative for abdominal distention, blood in stool and vomiting.  Genitourinary: Negative for dysuria, frequency, hematuria and urgency.  Musculoskeletal: Negative for arthralgias, myalgias, neck pain and neck stiffness.  Skin: Negative for rash.  Allergic/Immunologic: Negative for immunocompromised state.  Neurological: Negative for dizziness, weakness, light-headedness, numbness and headaches.  All other systems reviewed and are negative.    Physical Exam Updated Vital Signs BP 125/87 (BP Location: Right Arm)   Pulse 64   Temp 98.4 F (36.9 C) (Oral)   Resp 14   Ht 5\' 9"  (1.753 m)   Wt 65.8 kg (145 lb)   SpO2 100%   BMI 21.41 kg/m   Physical Exam  Constitutional: He appears well-developed and well-nourished. No distress.  HENT:  Head: Normocephalic and atraumatic.  Eyes: Conjunctivae are normal.  Neck: Neck supple.  Cardiovascular: Normal rate, regular rhythm and normal heart sounds.  Pulmonary/Chest: Effort normal. No respiratory distress. He has no wheezes. He has no rales.  Abdominal: Soft. Bowel sounds are normal. He exhibits no distension. There is tenderness. There is no rebound.  ttp over epigastric area mainly with mild tenderness diffusely  Musculoskeletal: He exhibits no edema.  Neurological: He is alert.  Skin: Skin is warm and dry.  Nursing note and vitals reviewed.    ED Treatments / Results  Labs (all labs ordered are listed, but only abnormal results are displayed) Labs Reviewed  COMPREHENSIVE METABOLIC PANEL - Abnormal; Notable for the following components:      Result Value   Total Protein 6.1 (*)    ALT 11 (*)    All other components within normal limits  LIPASE, BLOOD  CBC    URINALYSIS, ROUTINE W REFLEX MICROSCOPIC    EKG  EKG Interpretation None       Radiology Ct Abdomen Pelvis W Contrast  Result Date: 08/27/2017 CLINICAL DATA:  Abdominal pain EXAM: CT ABDOMEN AND PELVIS WITH CONTRAST TECHNIQUE: Multidetector CT imaging of the abdomen and pelvis was performed using the standard protocol following bolus administration of intravenous contrast. CONTRAST:  100 mL Isovue 370 nonionic COMPARISON:  None. FINDINGS: Lower chest: Lung bases are clear. Hepatobiliary: No focal liver lesions are apparent. Gallbladder wall is not appreciably thickened. There is no biliary duct dilatation. Pancreas: No pancreatic mass or inflammatory focus. Spleen: No splenic lesions are evident. There is a small accessory spleen anterior to the spleen. Adrenals/Urinary Tract: Adrenals bilaterally appear normal. Kidneys bilaterally show no evident mass or hydronephrosis on either side. There is no renal or ureteral calculus on either side. Urinary bladder is midline with wall thickness within normal limits. Stomach/Bowel: There is no appreciable bowel wall or mesenteric thickening. No evident bowel obstruction. No free air or portal venous air. Vascular/Lymphatic: No abdominal aortic aneurysm. No vascular lesions are evident. No adenopathy is appreciable in the abdomen or pelvis. Reproductive: Prostate and seminal vesicles are normal in size and contour. No pelvic mass evident. Other: There is a small amount of free fluid in the dependent portion of the pelvis. No abscess evident in the abdomen or pelvis. Appendix appears unremarkable. Musculoskeletal: There are no blastic or lytic bone lesions. There is no intramuscular or abdominal wall lesion. IMPRESSION: 1. Small amount of free fluid in the dependent portion of the pelvis posteriorly of uncertain etiology. No ascites elsewhere. 2. No bowel obstruction. Appendix appears normal. No abscess evident in the abdomen or pelvis. 3.  No renal or ureteral  calculus.  No hydronephrosis. Electronically Signed   By: Lowella Grip III M.D.   On: 08/27/2017 13:19   Dg Abd 2 Views  Result Date: 08/27/2017 CLINICAL DATA:  Epigastric pain for the last 12 hours, some diarrhea EXAM: ABDOMEN - 2 VIEW COMPARISON:  None. FINDINGS: Supine and erect views the abdomen show no bowel obstruction. No free air is seen. There is some small bowel gas in the right lower quadrant which could reflect a local inflammatory process. Clinical correlation is recommended. No opaque calculi are noted. Bones are unremarkable. IMPRESSION: 1. No bowel obstruction.  No free air. 2. Small bowel gas in the right lower quadrant could indicate a local inflammatory process. Correlate clinically. Consider CT the abdomen pelvis if warranted. Electronically Signed   By: Ivar Drape M.D.   On: 08/27/2017 09:45    Procedures Procedures (including critical care time)  Medications Ordered in ED Medications  gi cocktail (Maalox,Lidocaine,Donnatal) (not administered)  dicyclomine (BENTYL) capsule 10 mg (not administered)     Initial Impression / Assessment and Plan / ED Course  I have reviewed the triage vital signs and the nursing notes.  Pertinent labs & imaging results that were available during my care  of the patient were reviewed by me and considered in my medical decision making (see chart for details).     Patient in emergency department with epigastric abdominal pain and tenderness all over the abdomen.  There is no guarding or rebound tenderness.  I will try GI cocktail, Bentyl, will get plain x-ray of the abdomen to evaluate bowel gas patterns.  Urinalysis, labs all unremarkable.  No acute abdomen.  X-ray showing some patient CT scan is negative bowel gas in the right upper quadrant which could indicate local inflammatory process.  CT scan recommended, will obtain.  Other than some nonspecific small amount of free fluid.  Patient's exam repeated, no acute abdomen, diffusely  tender but mainly in epigastric area.  Will treat for gastritis.  Will start on Pepcid and Carafate.  Discussed bland diet.  Follow-up with family doctor as needed.  Stable for discharge home.  Vitals:   08/26/17 2351 08/26/17 2355 08/27/17 0957 08/27/17 1347  BP: 125/87  118/64 122/74  Pulse: 64  (!) 52 60  Resp: 14  16 16   Temp: 98.4 F (36.9 C)     TempSrc: Oral     SpO2: 100%  100% 100%  Weight:  65.8 kg (145 lb)    Height:  5\' 9"  (1.753 m)      Final Clinical Impressions(s) / ED Diagnoses   Final diagnoses:  Abdominal pain  Epigastric pain    ED Discharge Orders        Ordered    famotidine (PEPCID) 20 MG tablet  2 times daily     08/27/17 1335    sucralfate (CARAFATE) 1 g tablet  3 times daily with meals & bedtime     08/27/17 1335       Jeannett Senior, PA-C 08/27/17 1529    Blanchie Dessert, MD 08/27/17 1600

## 2017-08-27 NOTE — Discharge Instructions (Signed)
Take Pepcid and Carafate as prescribed.  Avoid any spicy or tomato based foods. Follow up with primary care doctor as needed. Return if worsening. Your lab work and CT normal today

## 2017-08-27 NOTE — ED Notes (Signed)
Pt taken to xray 

## 2017-08-27 NOTE — ED Notes (Signed)
Pt verbalized understanding of discharge instructions and prescriptions

## 2017-09-15 ENCOUNTER — Encounter (HOSPITAL_COMMUNITY): Payer: Self-pay | Admitting: Emergency Medicine

## 2017-09-15 ENCOUNTER — Ambulatory Visit (HOSPITAL_COMMUNITY)
Admission: EM | Admit: 2017-09-15 | Discharge: 2017-09-15 | Disposition: A | Discharge status: Home / Self Care | Payer: Self-pay | Attending: Urgent Care | Admitting: Urgent Care

## 2017-09-15 DIAGNOSIS — R1013 Epigastric pain: Secondary | ICD-10-CM

## 2017-09-15 DIAGNOSIS — K219 Gastro-esophageal reflux disease without esophagitis: Secondary | ICD-10-CM

## 2017-09-15 DIAGNOSIS — R112 Nausea with vomiting, unspecified: Secondary | ICD-10-CM

## 2017-09-15 LAB — POCT H PYLORI SCREEN: H. PYLORI SCREEN, POC: NEGATIVE

## 2017-09-15 MED ORDER — OMEPRAZOLE 20 MG PO CPDR: 20.0000 mg | DELAYED_RELEASE_CAPSULE | Freq: Every day | ORAL | 1 refills | Status: DC

## 2017-09-15 MED ORDER — FAMOTIDINE 20 MG PO TABS: 20.0000 mg | ORAL_TABLET | Freq: Two times a day (BID) | ORAL | 0 refills | Status: DC

## 2017-09-15 MED ORDER — ONDANSETRON 8 MG PO TBDP: 8.0000 mg | ORAL_TABLET | Freq: Three times a day (TID) | ORAL | 0 refills | Status: DC | PRN

## 2017-09-15 NOTE — ED Triage Notes (Signed)
PT reports abdominal pain, nausea that started yesterday.

## 2017-09-15 NOTE — Discharge Instructions (Signed)
Hydrate well with at least 2 liters (1 gallon) of water daily. Avoid eating acidic foods as described in the notes. If you get fever, bloody vomiting, bloody diarrhea, come back to our clinic for a recheck.

## 2017-09-15 NOTE — ED Provider Notes (Signed)
  MRN: [id number] DOB: 07-Dec-1995  Subjective:   Philip Trevino is a 22 y.o. male presenting for 1 day history of nausea with vomiting (2 episodes), epigastric pain. Has not had diarrhea, bowel movement (since yesterday). Denies fever, constipation. Has been eating and drinking normal foods. Has been told that he may have reflux in the past, is trying to avoid acidic foods but was eating heavy pizza this past episode of epigastric pain. Drinks alcohol occasionally. Smokes Black & Milds. Smokes marijuana once weekly.  Zelma is allergic to other.  Mang denies past medical and surgical history.  Objective:   Vitals: BP 128/77   Pulse 72   Temp 98.6 F (37 C) (Oral)   Resp 16   Wt 150 lb (68 kg)   SpO2 96%   BMI 22.15 kg/m   Physical Exam  Constitutional: He is oriented to person, place, and time. He appears well-developed and well-nourished.  HENT:  Mouth/Throat: Oropharynx is clear and moist.  Cardiovascular: Normal rate, regular rhythm and intact distal pulses. Exam reveals no gallop and no friction rub.  No murmur heard. Pulmonary/Chest: No respiratory distress. He has no wheezes. He has no rales.  Abdominal: There is no hepatosplenomegaly. There is tenderness in the epigastric area. There is no rebound and no guarding. No hernia.  Neurological: He is alert and oriented to person, place, and time.  Skin: Skin is warm and dry.  Psychiatric: He has a normal mood and affect.   Results for orders placed or performed during the hospital encounter of 09/15/17 (from the past 24 hour(s))  H.pylori screen, POC     Status: None   Collection Time: 09/15/17  8:07 PM  Result Value Ref Range   H. PYLORI SCREEN, POC NEGATIVE NEGATIVE   Assessment and Plan :   Epigastric pain  Gastroesophageal reflux disease, esophagitis presence not specified  Will manage as GERD. Start Prilosec, use famotidine for short term relief. Dietary modifications recommended. Return-to-clinic precautions  discussed, patient verbalized understanding.    Jaynee Eagles, [place] 09/16/17 [phone]

## 2019-03-02 ENCOUNTER — Ambulatory Visit (HOSPITAL_COMMUNITY)
Admission: EM | Admit: 2019-03-02 | Discharge: 2019-03-02 | Disposition: A | Discharge status: Home / Self Care | Payer: Self-pay | Attending: Family Medicine | Admitting: Family Medicine

## 2019-03-02 ENCOUNTER — Other Ambulatory Visit: Payer: Self-pay

## 2019-03-02 ENCOUNTER — Encounter (HOSPITAL_COMMUNITY): Payer: Self-pay

## 2019-03-02 DIAGNOSIS — R609 Edema, unspecified: Secondary | ICD-10-CM | POA: Insufficient documentation

## 2019-03-02 DIAGNOSIS — R809 Proteinuria, unspecified: Secondary | ICD-10-CM | POA: Insufficient documentation

## 2019-03-02 LAB — COMPREHENSIVE METABOLIC PANEL
ALT: 16 U/L (ref 0–44)
AST: 20 U/L (ref 15–41)
Albumin: 1.8 g/dL — ABNORMAL LOW (ref 3.5–5.0)
Alkaline Phosphatase: 76 U/L (ref 38–126)
Anion gap: 5 (ref 5–15)
BUN: 9 mg/dL (ref 6–20)
CO2: 27 mmol/L (ref 22–32)
Calcium: 7.9 mg/dL — ABNORMAL LOW (ref 8.9–10.3)
Chloride: 109 mmol/L (ref 98–111)
Creatinine, Ser: 0.94 mg/dL (ref 0.61–1.24)
GFR calc Af Amer: >60 mL/min (ref >60)
GFR calc non Af Amer: >60 mL/min (ref >60)
Glucose, Bld: 101 mg/dL — ABNORMAL HIGH (ref 70–99)
Potassium: 3.4 mmol/L — ABNORMAL LOW (ref 3.5–5.1)
Sodium: 141 mmol/L (ref 135–145)
Total Bilirubin: 0.2 mg/dL — ABNORMAL LOW (ref 0.3–1.2)
Total Protein: 4.2 g/dL — ABNORMAL LOW (ref 6.5–8.1)

## 2019-03-02 LAB — POCT URINALYSIS DIP (DEVICE)
Bilirubin Urine: NEGATIVE
Glucose, UA: NEGATIVE mg/dL
Leukocytes,Ua: NEGATIVE
Nitrite: NEGATIVE
Protein, ur: >=300 mg/dL — AB
Specific Gravity, Urine: 1.025 (ref 1.005–1.030)
Urobilinogen, UA: 1 mg/dL (ref 0.0–1.0)
pH: 7 (ref 5.0–8.0)

## 2019-03-02 NOTE — ED Notes (Signed)
Patient discharged home by provider with instructions to go to the ED after the patient takes care of his children at home. Patient verbalizes understanding and agrees to go to the hospital tonight.

## 2019-03-02 NOTE — ED Triage Notes (Signed)
Patient presents to Urgent Care with complaints of bilateral lower leg and feet swelling since yesterday. Patient reports this has not happened in the past, pt denies alcohol or drug use. Edema is pitting +4, skin is shiny.

## 2019-03-03 ENCOUNTER — Telehealth (HOSPITAL_COMMUNITY): Payer: Self-pay | Admitting: Emergency Medicine

## 2019-03-03 NOTE — ED Provider Notes (Signed)
[name]   LT:[id number] 03/02/19 Arrival Time: 1910  ASSESSMENT & PLAN:  1. Peripheral edema   2. Proteinuria, unspecified type     Concern for acute nephrotic syndrome. CMP pending. After discussion of possible f/u and with no insurance, recommend:  Follow-up Information    Go to  [facility].   Specialty: Emergency Medicine Contact information: [address] [id number] [name] [phone]         Will need to r/o AKI. He does think he has family member with kidney problems. He agrees.  Reviewed expectations re: course of current medical issues. Questions answered. Outlined signs and symptoms indicating need for more acute intervention. Patient verbalized understanding. After Visit Summary given.   SUBJECTIVE: History from: patient. Philip Trevino is a 23 y.o. male who presents with complaint of persistent bilateral LE edema. First noticed approx 3 days ago. Would decrease overnight "but now just kind of staying there". Is painful when LE are swollen. No specific aggravating or alleviating factors reported. No skin changes/wounds/erythema. Afebrile. Occasional swelling of scrotum reported but none currently. No abdominal discomfort or swelling. No new medications. Normal PO intake without n/v. No recent illnesses. No dysuria or urinary frequency. No penile discharge. No associated CP or SOB. Normal sleep habits. Normal bowel habits. No h/o similar.  History reviewed. No pertinent surgical history.  ROS: As per HPI. All other systems negative.  OBJECTIVE:  Vitals:   03/02/19 1925  BP: 129/85  Pulse: 84  Resp: 17  Temp: 98.8 F (37.1 C)  TempSrc: Oral  SpO2: 100%    General appearance: alert, oriented, no acute distress  HEENT: [place]; AT Neck: supple without LAD; no JVD Lungs: clear to auscultation bilaterally; unlabored respirations Heart: regular rate and rhythm Abdomen:  soft; without distention; no tenderness; normal bowel sounds; without masses or organomegaly; without guarding or rebound tenderness Back: without CVA tenderness; FROM at waist Extremities: with 2+ bilateral LE edema; symmetrical; no calf pain or swelling Skin: warm and dry Neurologic: normal gait Psychological: alert and cooperative; normal mood and affect  Labs: Labs Reviewed  COMPREHENSIVE METABOLIC PANEL - Pending  POCT URINALYSIS DIP (DEVICE) - Abnormal; Notable for the following components:   Ketones, ur TRACE (*)    Hgb urine dipstick TRACE (*)    Protein, ur >=300 (*)    All other components within normal limits    Allergies  Allergen Reactions  . Other Hives    broccoli   PMH: Occasional GERD.                   Social History   Socioeconomic History  . Marital status: Significant Other    Spouse name: Not on file  . Number of children: Not on file  . Years of education: Not on file  . Highest education level: Not on file  Occupational History  . Not on file  Social Needs  . Financial resource strain: Not on file  . Food insecurity    Worry: Not on file    Inability: Not on file  . Transportation needs    Medical: Not on file    Non-medical: Not on file  Tobacco Use  . Smoking status: Light Tobacco Smoker  . Smokeless tobacco: Never Used  Substance and Sexual Activity  . Alcohol use: No  . Drug use: No  . Sexual activity: Not on file  Lifestyle  . Physical activity    Days per week:  Not on file    Minutes per session: Not on file  . Stress: Not on file  Relationships  . Social Herbalist on phone: Not on file    Gets together: Not on file    Attends religious service: Not on file    Active member of club or organization: Not on file    Attends meetings of clubs or organizations: Not on file    Relationship status: Not on file  . Intimate partner violence    Fear of current or ex partner: Not on file    Emotionally abused: Not on file     Physically abused: Not on file    Forced sexual activity: Not on file  Other Topics Concern  . Not on file  Social History Narrative  . Not on file   Family History  Problem Relation Age of Onset  . Healthy Mother   . Healthy Father      Vanessa Kick, MD 03/03/19 [phone]

## 2019-03-03 NOTE — Telephone Encounter (Signed)
Per doctor hagler, pt needs follow up with PCP due to significant symptoms, lab results. Possible kidney disease. Attempted to reach patient. No answer at this time. Voicemail left.

## 2019-03-04 ENCOUNTER — Other Ambulatory Visit: Payer: Self-pay

## 2019-03-04 ENCOUNTER — Encounter (HOSPITAL_COMMUNITY): Payer: Self-pay | Admitting: Emergency Medicine

## 2019-03-04 ENCOUNTER — Emergency Department (HOSPITAL_COMMUNITY): Payer: Self-pay

## 2019-03-04 ENCOUNTER — Emergency Department (HOSPITAL_COMMUNITY)
Admission: EM | Admit: 2019-03-04 | Discharge: 2019-03-04 | Disposition: A | Discharge status: Home / Self Care | Payer: Self-pay | Attending: Emergency Medicine | Admitting: Emergency Medicine

## 2019-03-04 DIAGNOSIS — F17210 Nicotine dependence, cigarettes, uncomplicated: Secondary | ICD-10-CM | POA: Insufficient documentation

## 2019-03-04 DIAGNOSIS — N049 Nephrotic syndrome with unspecified morphologic changes: Secondary | ICD-10-CM | POA: Insufficient documentation

## 2019-03-04 DIAGNOSIS — R600 Localized edema: Secondary | ICD-10-CM | POA: Insufficient documentation

## 2019-03-04 LAB — CBC
HCT: 42.9 % (ref 39.0–52.0)
Hemoglobin: 15.1 g/dL (ref 13.0–17.0)
MCH: 30.5 pg (ref 26.0–34.0)
MCHC: 35.2 g/dL (ref 30.0–36.0)
MCV: 86.7 fL (ref 80.0–100.0)
Platelets: 311 10*3/uL (ref 150–400)
RBC: 4.95 MIL/uL (ref 4.22–5.81)
RDW: 11.7 % (ref 11.5–15.5)
WBC: 7.3 10*3/uL (ref 4.0–10.5)
nRBC: 0 % (ref 0.0–0.2)

## 2019-03-04 LAB — URINALYSIS, ROUTINE W REFLEX MICROSCOPIC
Bilirubin Urine: NEGATIVE
Glucose, UA: NEGATIVE mg/dL
Ketones, ur: 20 mg/dL — AB
Leukocytes,Ua: NEGATIVE
Nitrite: NEGATIVE
Protein, ur: >=300 mg/dL — AB
Specific Gravity, Urine: 1.033 — ABNORMAL HIGH (ref 1.005–1.030)
pH: 6 (ref 5.0–8.0)

## 2019-03-04 LAB — BASIC METABOLIC PANEL
Anion gap: 6 (ref 5–15)
BUN: 11 mg/dL (ref 6–20)
CO2: 26 mmol/L (ref 22–32)
Calcium: 8 mg/dL — ABNORMAL LOW (ref 8.9–10.3)
Chloride: 109 mmol/L (ref 98–111)
Creatinine, Ser: 0.95 mg/dL (ref 0.61–1.24)
GFR calc Af Amer: >60 mL/min (ref >60)
GFR calc non Af Amer: >60 mL/min (ref >60)
Glucose, Bld: 85 mg/dL (ref 70–99)
Potassium: 3.6 mmol/L (ref 3.5–5.1)
Sodium: 141 mmol/L (ref 135–145)

## 2019-03-04 LAB — PROTEIN / CREATININE RATIO, URINE
Creatinine, Urine: 375.65 mg/dL
Protein Creatinine Ratio: 4.39 mg/mg{Cre} — ABNORMAL HIGH (ref 0.00–0.15)
Total Protein, Urine: 1648 mg/dL

## 2019-03-04 MED ORDER — FUROSEMIDE 40 MG PO TABS: 40.0000 mg | ORAL_TABLET | Freq: Every day | ORAL | 0 refills | Status: DC

## 2019-03-04 MED ORDER — POTASSIUM CHLORIDE CRYS ER 20 MEQ PO TBCR: 40.0000 meq | EXTENDED_RELEASE_TABLET | Freq: Once | ORAL | Status: DC

## 2019-03-04 MED ORDER — FUROSEMIDE 20 MG PO TABS
40.0000 mg | ORAL_TABLET | Freq: Every day | ORAL | Status: DC
  Administered 2019-03-04: 23:00:00 40 mg via ORAL
  Filled 2019-03-04: qty 2

## 2019-03-04 MED ORDER — POTASSIUM CHLORIDE CRYS ER 20 MEQ PO TBCR
20.0000 meq | EXTENDED_RELEASE_TABLET | Freq: Once | ORAL | Status: AC
  Administered 2019-03-04: 20 meq via ORAL
  Filled 2019-03-04: qty 1

## 2019-03-04 MED ORDER — POTASSIUM CHLORIDE ER 10 MEQ PO TBCR: 20.0000 meq | EXTENDED_RELEASE_TABLET | Freq: Every day | ORAL | 0 refills | Status: DC

## 2019-03-04 MED ORDER — SODIUM CHLORIDE 0.9% FLUSH: 3.0000 mL | Freq: Once | INTRAVENOUS | Status: DC

## 2019-03-04 NOTE — ED Provider Notes (Signed)
[facility] EMERGENCY DEPARTMENT Provider Note   CSN: [id number] Arrival date & time: 03/04/19  1642    History   Chief Complaint Chief Complaint  Patient presents with  . Acute Renal Failure    HPI Philip Trevino is a 23 y.o. male.  History provided by patient.  Patient reports that on 8/8, he had sudden onset of swelling in his bilateral lower extremities.  He stated that the following morning, he had improvement in the swelling, but then on the night of 8/9 he had worsening and swelling, and states "it is been there since then."  He reports that swelling has gotten progressively worse, and notes tightness in his bilateral lower extremities, but no other pain or discomfort.  States that he has been eating and drinking normally, urinating normally.  He denies any dizziness or weakness.  He states that aside from the swelling in his legs, he has otherwise been feeling fine.  Did have swelling in his scrotum a few days ago that last a few hours, went away, and never came back.  No recent illness, no fevers, chills, shortness of breath, chest pain.  Patient seen by urgent care on 8/11 for BLE edema.  Patient had proteinuria, hypoalbuminemia, and edema, therefore was advised to come to ED for possible nephrotic syndrome.  Reports occasional minimal alcohol use.  Smokes "2 or 3 times a week" black and milds, and occasionally smokes marijuana.  History reviewed. No pertinent past medical history.  There are no active problems to display for this patient.   Past Surgical History:  Procedure Laterality Date  . TONSILLECTOMY          Home Medications    Prior to Admission medications   Medication Sig Start Date End Date Taking? Authorizing Provider  ibuprofen (ADVIL) 200 MG tablet Take 200-400 mg by mouth every 8 (eight) hours as needed for headache or mild pain.   Yes [provider]  famotidine (PEPCID) 20 MG tablet Take 1 tablet (20 mg total) by mouth 2  (two) times daily. Patient not taking: Reported on 03/04/2019 09/15/17   Jaynee Eagles, PA-C  ibuprofen (ADVIL,MOTRIN) 600 MG tablet Take 1 tab PO Q6h x 1-2 days then Q6h prn Patient not taking: Reported on 03/04/2019 10/16/13   Kristen Cardinal, NP  omeprazole (PRILOSEC) 20 MG capsule Take 1 capsule (20 mg total) by mouth daily. Patient not taking: Reported on 03/04/2019 09/15/17   Jaynee Eagles, PA-C  ondansetron (ZOFRAN-ODT) 8 MG disintegrating tablet Take 1 tablet (8 mg total) by mouth every 8 (eight) hours as needed for nausea. Patient not taking: Reported on 03/04/2019 09/15/17   Jaynee Eagles, PA-C  sucralfate (CARAFATE) 1 g tablet Take 1 tablet (1 g total) by mouth 4 (four) times daily -  with meals and at bedtime. Patient not taking: Reported on 03/04/2019 08/27/17   Jeannett Senior, PA-C    Family History Family History  Problem Relation Age of Onset  . Healthy Mother   . Healthy Father     Social History Social History   Tobacco Use  . Smoking status: Light Tobacco Smoker  . Smokeless tobacco: Never Used  Substance Use Topics  . Alcohol use: No  . Drug use: No     Allergies   Broccoli [brassica oleracea italica]   Review of Systems Review of Systems  Constitutional: Negative for activity change, appetite change, chills, fatigue and fever.  HENT: Negative for congestion and sore throat.   Eyes: Negative for visual  disturbance.  Respiratory: Negative for cough and shortness of breath.   Cardiovascular: Positive for leg swelling. Negative for chest pain and palpitations.  Gastrointestinal: Negative for abdominal pain, blood in stool, constipation, diarrhea, nausea and vomiting.  Endocrine: Negative for polydipsia and polyuria.  Genitourinary: Positive for scrotal swelling (transient). Negative for difficulty urinating, dysuria, frequency and hematuria.  Musculoskeletal: Negative for back pain, gait problem and myalgias.  Skin: Negative for rash.  Neurological: Positive for  headaches (chronic, intermittent migraines). Negative for dizziness, syncope and weakness.     Physical Exam Updated Vital Signs BP 111/71   Pulse 60   Temp 98.9 F (37.2 C) (Oral)   Resp 15   SpO2 100%   Physical Exam Constitutional:      General: He is not in acute distress.    Appearance: Normal appearance. He is not ill-appearing.  HENT:     Head: Normocephalic and atraumatic.     Mouth/Throat:     Mouth: Mucous membranes are moist.     Pharynx: No oropharyngeal exudate or posterior oropharyngeal erythema.  Eyes:     Extraocular Movements: Extraocular movements intact.     Pupils: Pupils are equal, round, and reactive to light.  Neck:     Musculoskeletal: Normal range of motion and neck supple.  Cardiovascular:     Rate and Rhythm: Normal rate and regular rhythm.     Heart sounds: No murmur. No friction rub. No gallop.   Pulmonary:     Effort: Pulmonary effort is normal.     Breath sounds: Normal breath sounds. No wheezing, rhonchi or rales.  Abdominal:     General: Abdomen is flat. Bowel sounds are normal.     Palpations: Abdomen is soft.     Tenderness: There is no abdominal tenderness.  Musculoskeletal:     Right lower leg: Edema (2+ pitting) present.     Left lower leg: Edema (2+ pitting) present.  Skin:    General: Skin is warm and dry.  Neurological:     General: No focal deficit present.     Mental Status: He is alert and oriented to person, place, and time. Mental status is at baseline.     Sensory: No sensory deficit.     Motor: No weakness.  Psychiatric:        Mood and Affect: Mood normal.        Behavior: Behavior normal.      ED Treatments / Results  Labs (all labs ordered are listed, but only abnormal results are displayed) Labs Reviewed  BASIC METABOLIC PANEL - Abnormal; Notable for the following components:      Result Value   Calcium 8.0 (*)    All other components within normal limits  URINALYSIS, ROUTINE W REFLEX MICROSCOPIC -  Abnormal; Notable for the following components:   APPearance HAZY (*)    Specific Gravity, Urine 1.033 (*)    Hgb urine dipstick SMALL (*)    Ketones, ur 20 (*)    Protein, ur >=300 (*)    Bacteria, UA FEW (*)    All other components within normal limits  CBC  PROTEIN / CREATININE RATIO, URINE    EKG EKG Interpretation  Date/Time:  Thursday March 04 2019 16:54:03 EDT Ventricular Rate:  81 PR Interval:  132 QRS Duration: 74 QT Interval:  354 QTC Calculation: 411 R Axis:   32 Text Interpretation:  Normal sinus rhythm Normal ECG No significant change since last tracing Confirmed by Wandra Arthurs [phone]) on  03/04/2019 9:47:59 PM   Radiology Dg Chest 2 View  Result Date: 03/04/2019 CLINICAL DATA:  4 day history of bilateral lower extremity swelling. EXAM: CHEST - 2 VIEW COMPARISON:  05/19/2017 FINDINGS: The heart size and mediastinal contours are within normal limits. Both lungs are clear. The visualized skeletal structures are unremarkable. IMPRESSION: No active cardiopulmonary disease. Electronically Signed   By: Misty Stanley M.D.   On: 03/04/2019 17:39    Procedures Procedures (including critical care time)  Medications Ordered in ED Medications  sodium chloride flush (NS) 0.9 % injection 3 mL (has no administration in time range)  furosemide (LASIX) tablet 40 mg (40 mg Oral Given 03/04/19 2243)  potassium chloride SA (K-DUR) CR tablet 20 mEq (20 mEq Oral Given 03/04/19 2246)     Initial Impression / Assessment and Plan / ED Course  I have reviewed the triage vital signs and the nursing notes.  Pertinent labs & imaging results that were available during my care of the patient were reviewed by me and considered in my medical decision making (see chart for details).  Philip Trevino is a 23 yo male with no significant PMH, who presents with BLE edema, proteinuria, and hypoalbuminemia at urgent care.  Given patient's significant edema, and these known labs, high suspicion for  nephrotic syndrome.  Cause of this is unclear, as patient does not have diabetes, liver dysfunction, or strong family history of this.  Will order urine protein/creatinie ratio and call nephrology to discuss case.  Reassured that patient is well-appearing, without complaint, and with VSS.  Spoke with Nephrologist on call, Dr. Hollie Salk, who noted that patient should be given Lasix 40mg  PO QD and potassium supplementation over the weekend and she will set him up from an appointment in her office on Monday, 8/17.  Given patient's stable vital signs, and appropriate follow up, he is stable for discharge home.  Discussed results and plan with patient, who agreed.  Return precautions discussed with patient, he voiced understanding.  Patient will be discharged.   Final Clinical Impressions(s) / ED Diagnoses   Final diagnoses:  Nephrotic syndrome    ED Discharge Orders    None       Cleophas Dunker, DO 03/04/19 2250    Drenda Freeze, MD 03/06/19 1743

## 2019-03-04 NOTE — Discharge Instructions (Addendum)
You were seen in the ED for possible nephrotic syndrome.  You should take Lasix 40mg  once a day with Potassium chloride 20 mEq once a day until you are seen by the kidney doctor on Monday.  If you do not receive a call from their office tomorrow, please call them at the number provided.  If you have worsening of your symptoms, increased pain, difficulty breathing, dizziness, walking, or chest pain, you should be see right away.

## 2019-03-04 NOTE — ED Notes (Signed)
Pt only c/o being hungry.  Pt alert and oriented x's 3.  Pt ambulated to bathroom without any problems

## 2019-03-04 NOTE — ED Triage Notes (Signed)
The patient comes to our ED after being called from UC due to his lab results and to rule out AKI. Sudden onset of bilateral pitting edema +4 since 03/01/19. Denies shortness or breath or chest pain. Pt is a/o x4

## 2019-03-11 ENCOUNTER — Other Ambulatory Visit (HOSPITAL_COMMUNITY): Payer: Self-pay | Admitting: Nephrology

## 2019-03-11 DIAGNOSIS — N049 Nephrotic syndrome with unspecified morphologic changes: Secondary | ICD-10-CM

## 2019-03-12 ENCOUNTER — Other Ambulatory Visit: Payer: Self-pay | Admitting: Nephrology

## 2019-03-12 DIAGNOSIS — R600 Localized edema: Secondary | ICD-10-CM

## 2019-03-22 ENCOUNTER — Other Ambulatory Visit: Payer: Self-pay | Admitting: Physician Assistant

## 2019-03-22 ENCOUNTER — Other Ambulatory Visit: Payer: Self-pay | Admitting: Radiology

## 2019-03-23 ENCOUNTER — Ambulatory Visit (HOSPITAL_COMMUNITY)
Admission: RE | Admit: 2019-03-23 | Discharge: 2019-03-23 | Disposition: A | Discharge status: Home / Self Care | Payer: Self-pay | Source: Ambulatory Visit | Attending: Nephrology | Admitting: Nephrology

## 2019-03-23 ENCOUNTER — Other Ambulatory Visit: Payer: Self-pay

## 2019-03-23 ENCOUNTER — Encounter (HOSPITAL_COMMUNITY): Payer: Self-pay

## 2019-03-23 DIAGNOSIS — R809 Proteinuria, unspecified: Secondary | ICD-10-CM | POA: Insufficient documentation

## 2019-03-23 DIAGNOSIS — R600 Localized edema: Secondary | ICD-10-CM | POA: Insufficient documentation

## 2019-03-23 DIAGNOSIS — N049 Nephrotic syndrome with unspecified morphologic changes: Secondary | ICD-10-CM | POA: Insufficient documentation

## 2019-03-23 DIAGNOSIS — E8809 Other disorders of plasma-protein metabolism, not elsewhere classified: Secondary | ICD-10-CM | POA: Insufficient documentation

## 2019-03-23 DIAGNOSIS — F17210 Nicotine dependence, cigarettes, uncomplicated: Secondary | ICD-10-CM | POA: Insufficient documentation

## 2019-03-23 DIAGNOSIS — Z79899 Other long term (current) drug therapy: Secondary | ICD-10-CM | POA: Insufficient documentation

## 2019-03-23 LAB — CBC
HCT: 40.2 % (ref 39.0–52.0)
Hemoglobin: 13.7 g/dL (ref 13.0–17.0)
MCH: 29.8 pg (ref 26.0–34.0)
MCHC: 34.1 g/dL (ref 30.0–36.0)
MCV: 87.6 fL (ref 80.0–100.0)
Platelets: 261 10*3/uL (ref 150–400)
RBC: 4.59 MIL/uL (ref 4.22–5.81)
RDW: 11.5 % (ref 11.5–15.5)
WBC: 6.3 10*3/uL (ref 4.0–10.5)
nRBC: 0 % (ref 0.0–0.2)

## 2019-03-23 LAB — PROTIME-INR
INR: 1.1 (ref 0.8–1.2)
Prothrombin Time: 13.8 seconds (ref 11.4–15.2)

## 2019-03-23 MED ORDER — FENTANYL CITRATE (PF) 100 MCG/2ML IJ SOLN
INTRAMUSCULAR | Status: AC | PRN
  Administered 2019-03-23: 25 ug via INTRAVENOUS
  Administered 2019-03-23: 50 ug via INTRAVENOUS

## 2019-03-23 MED ORDER — FENTANYL CITRATE (PF) 100 MCG/2ML IJ SOLN
INTRAMUSCULAR | Status: AC
  Filled 2019-03-23: qty 2

## 2019-03-23 MED ORDER — MIDAZOLAM HCL 2 MG/2ML IJ SOLN
INTRAMUSCULAR | Status: AC
  Filled 2019-03-23: qty 2

## 2019-03-23 MED ORDER — GELATIN ABSORBABLE 12-7 MM EX MISC
CUTANEOUS | Status: AC
  Filled 2019-03-23: qty 1

## 2019-03-23 MED ORDER — SODIUM CHLORIDE 0.9 % IV SOLN: INTRAVENOUS | Status: DC

## 2019-03-23 MED ORDER — MIDAZOLAM HCL 2 MG/2ML IJ SOLN
INTRAMUSCULAR | Status: AC | PRN
  Administered 2019-03-23: 1 mg via INTRAVENOUS

## 2019-03-23 MED ORDER — LIDOCAINE-EPINEPHRINE 1 %-1:100000 IJ SOLN
INTRAMUSCULAR | Status: AC
  Filled 2019-03-23: qty 1

## 2019-03-23 NOTE — H&P (Signed)
Chief Complaint: Patient was seen in consultation today for random renal biopsy at the request of [facility]  Referring Physician(s): [place]  Supervising Physician: Jacqulynn Cadet  Patient Status: [facility] - Out-pt  History of Present Illness: Philip Trevino is a 23 y.o. male   New onset B leg swelling Proteinuria; Urine Prot Creatinine elevated Hypoalbuminemia Urgent Care sent pt to Nephrology  Dr Hollie Salk diagnosis: Nephrotic syndrome Now scheduled for random renal biopsy    History reviewed. No pertinent past medical history.  Past Surgical History:  Procedure Laterality Date  . TONSILLECTOMY      Allergies: Broccoli [brassica oleracea italica]  Medications: Prior to Admission medications   Medication Sig Start Date End Date Taking? Authorizing Provider  furosemide (LASIX) 40 MG tablet Take 1 tablet (40 mg total) by mouth daily. 03/04/19 03/03/20 Yes Meccariello, Bernita Raisin, DO  potassium chloride (K-DUR) 10 MEQ tablet Take 2 tablets (20 mEq total) by mouth daily. 03/04/19  Yes Meccariello, Bernita Raisin, DO  ibuprofen (ADVIL) 200 MG tablet Take 200-400 mg by mouth every 8 (eight) hours as needed for headache or mild pain.    [provider]     Family History  Problem Relation Age of Onset  . Healthy Mother   . Healthy Father     Social History   Socioeconomic History  . Marital status: Significant Other    Spouse name: Not on file  . Number of children: Not on file  . Years of education: Not on file  . Highest education level: Not on file  Occupational History  . Not on file  Social Needs  . Financial resource strain: Not on file  . Food insecurity    Worry: Not on file    Inability: Not on file  . Transportation needs    Medical: Not on file    Non-medical: Not on file  Tobacco Use  . Smoking status: Light Tobacco Smoker  . Smokeless tobacco: Never Used  Substance and Sexual Activity  . Alcohol use: No  . Drug use: No  .  Sexual activity: Not on file  Lifestyle  . Physical activity    Days per week: Not on file    Minutes per session: Not on file  . Stress: Not on file  Relationships  . Social Herbalist on phone: Not on file    Gets together: Not on file    Attends religious service: Not on file    Active member of club or organization: Not on file    Attends meetings of clubs or organizations: Not on file    Relationship status: Not on file  Other Topics Concern  . Not on file  Social History Narrative  . Not on file    Review of Systems: A 12 point ROS discussed and pertinent positives are indicated in the HPI above.  All other systems are negative.  Review of Systems  Constitutional: Negative for activity change, fatigue and fever.  Respiratory: Negative for cough and shortness of breath.   Cardiovascular: Positive for leg swelling. Negative for chest pain.  Gastrointestinal: Negative for abdominal pain.  Musculoskeletal: Negative for back pain and gait problem.  Neurological: Negative for weakness.  Psychiatric/Behavioral: Negative for behavioral problems and confusion.    Vital Signs: BP (!) 129/94   Pulse 69   Temp (!) 97.5 F (36.4 C) (Oral)   Resp 18   Ht 5\' 10"  (1.778 m)   Wt 160 lb (72.6 kg) Comment: Stated  by patient. Weight gain.  SpO2 100%   BMI 22.96 kg/m   Physical Exam Vitals signs reviewed.  HENT:     Mouth/Throat:     Mouth: Mucous membranes are moist.  Cardiovascular:     Rate and Rhythm: Normal rate and regular rhythm.     Heart sounds: Normal heart sounds.  Pulmonary:     Effort: Pulmonary effort is normal.     Breath sounds: Normal breath sounds.  Abdominal:     Palpations: Abdomen is soft.  Musculoskeletal: Normal range of motion.        General: Swelling present.     Right lower leg: Edema present.     Left lower leg: Edema present.  Skin:    General: Skin is warm and dry.  Neurological:     Mental Status: He is alert and oriented to  person, place, and time.  Psychiatric:        Mood and Affect: Mood normal.        Behavior: Behavior normal.        Thought Content: Thought content normal.        Judgment: Judgment normal.     Imaging: Dg Chest 2 View  Result Date: 03/04/2019 CLINICAL DATA:  4 day history of bilateral lower extremity swelling. EXAM: CHEST - 2 VIEW COMPARISON:  05/19/2017 FINDINGS: The heart size and mediastinal contours are within normal limits. Both lungs are clear. The visualized skeletal structures are unremarkable. IMPRESSION: No active cardiopulmonary disease. Electronically Signed   By: Misty Stanley M.D.   On: 03/04/2019 17:39    Labs:  CBC: Recent Labs    03/04/19 1701 03/23/19 0620  WBC 7.3 6.3  HGB 15.1 13.7  HCT 42.9 40.2  PLT 311 261    COAGS: Recent Labs    03/23/19 0620  INR 1.1    BMP: Recent Labs    03/02/19 1935 03/04/19 1701  NA 141 141  K 3.4* 3.6  CL 109 109  CO2 27 26  GLUCOSE 101* 85  BUN 9 11  CALCIUM 7.9* 8.0*  CREATININE 0.94 0.95  GFRNONAA >60 >60  GFRAA >60 >60    LIVER FUNCTION TESTS: Recent Labs    03/02/19 1935  BILITOT 0.2*  AST 20  ALT 16  ALKPHOS 76  PROT 4.2*  ALBUMIN 1.8*    TUMOR MARKERS: No results for input(s): AFPTM, CEA, CA199, CHROMGRNA in the last 8760 hours.  Assessment and Plan:  Proteinuria B leg edema Nephrotic syndrome Scheduled for random renal biopsy Risks and benefits of random renal bx was discussed with the patient and/or patient's family including, but not limited to bleeding, infection, damage to adjacent structures or low yield requiring additional tests.  All of the questions were answered and there is agreement to proceed. Consent signed and in chart.   Thank you for this interesting consult.  I greatly enjoyed meeting Addy Betancourt and look forward to participating in their care.  A copy of this report was sent to the requesting provider on this date.  Electronically Signed: Lavonia Drafts,  PA-C 03/23/2019, 7:08 AM   I spent a total of  30 Minutes   in face to face in clinical consultation, greater than 50% of which was counseling/coordinating care for random renal bx

## 2019-03-23 NOTE — Discharge Instructions (Signed)
Needle Biopsy, Care After °These instructions tell you how to care for yourself after your procedure. Your doctor may also give you more specific instructions. Call your doctor if you have any problems or questions. °What can I expect after the procedure? °After the procedure, it is common to have: °· Soreness. °· Bruising. °· Mild pain. °Follow these instructions at home: ° °· Return to your normal activities as told by your doctor. Ask your doctor what activities are safe for you. °· Take over-the-counter and prescription medicines only as told by your doctor. °· Wash your hands with soap and water before you change your bandage (dressing). If you cannot use soap and water, use hand sanitizer. °· Follow instructions from your doctor about: °? How to take care of your puncture site. °? When and how to change your bandage. °? When to remove your bandage. °· Check your puncture site every day for signs of infection. Watch for: °? Redness, swelling, or pain. °? Fluid or blood.  °? Pus or a bad smell. °? Warmth. °· Do not take baths, swim, or use a hot tub until your doctor approves. Ask your doctor if you may take showers. You may only be allowed to take sponge baths. °· Keep all follow-up visits as told by your doctor. This is important. °Contact a doctor if you have: °· A fever. °· Redness, swelling, or pain at the puncture site, and it lasts longer than a few days. °· Fluid, blood, or pus coming from the puncture site. °· Warmth coming from the puncture site. °Get help right away if: °· You have a lot of bleeding from the puncture site. °Summary °· After the procedure, it is common to have soreness, bruising, or mild pain at the puncture site. °· Check your puncture site every day for signs of infection, such as redness, swelling, or pain. °· Get help right away if you have severe bleeding from your puncture site. °This information is not intended to replace advice given to you by your health care provider. Make  sure you discuss any questions you have with your health care provider. °Document Released: 06/20/2008 Document Revised: 07/21/2017 Document Reviewed: 07/21/2017 °Elsevier Patient Education © 2020 [employer] ° °

## 2019-03-23 NOTE — Procedures (Signed)
Pre Procedure Dx: Proteinuria Post Procedural Dx: Same  Technically successful US guided biopsy of inferior pole of the right kidney.  EBL: None  No immediate complications.   Jay Treazure Nery, MD Pager #: [phone]    

## 2019-03-23 NOTE — Progress Notes (Signed)
Discharge instructions reviewed with pt and with sig other.Voices understanding.

## 2019-03-28 ENCOUNTER — Other Ambulatory Visit: Payer: Self-pay | Admitting: Family Medicine

## 2019-04-08 ENCOUNTER — Encounter (HOSPITAL_COMMUNITY): Payer: Self-pay

## 2020-01-27 ENCOUNTER — Encounter (HOSPITAL_COMMUNITY): Payer: Self-pay | Admitting: Emergency Medicine

## 2020-01-27 ENCOUNTER — Emergency Department (HOSPITAL_COMMUNITY): Payer: Self-pay

## 2020-01-27 ENCOUNTER — Inpatient Hospital Stay (HOSPITAL_COMMUNITY)
Admission: EM | Admit: 2020-01-27 | Discharge: 2020-02-02 | DRG: 699 | Disposition: A | Discharge status: Home / Self Care | Payer: Self-pay | Attending: Internal Medicine | Admitting: Internal Medicine

## 2020-01-27 ENCOUNTER — Other Ambulatory Visit: Payer: Self-pay

## 2020-01-27 DIAGNOSIS — Z91120 Patient's intentional underdosing of medication regimen due to financial hardship: Secondary | ICD-10-CM

## 2020-01-27 DIAGNOSIS — L299 Pruritus, unspecified: Secondary | ICD-10-CM | POA: Diagnosis not present

## 2020-01-27 DIAGNOSIS — Z590 Homelessness: Secondary | ICD-10-CM

## 2020-01-27 DIAGNOSIS — T380X6A Underdosing of glucocorticoids and synthetic analogues, initial encounter: Secondary | ICD-10-CM | POA: Diagnosis present

## 2020-01-27 DIAGNOSIS — D649 Anemia, unspecified: Secondary | ICD-10-CM | POA: Diagnosis present

## 2020-01-27 DIAGNOSIS — R609 Edema, unspecified: Secondary | ICD-10-CM

## 2020-01-27 DIAGNOSIS — N179 Acute kidney failure, unspecified: Secondary | ICD-10-CM | POA: Diagnosis present

## 2020-01-27 DIAGNOSIS — E8770 Fluid overload, unspecified: Secondary | ICD-10-CM | POA: Diagnosis present

## 2020-01-27 DIAGNOSIS — R319 Hematuria, unspecified: Secondary | ICD-10-CM | POA: Diagnosis not present

## 2020-01-27 DIAGNOSIS — J90 Pleural effusion, not elsewhere classified: Secondary | ICD-10-CM | POA: Diagnosis present

## 2020-01-27 DIAGNOSIS — R601 Generalized edema: Secondary | ICD-10-CM | POA: Diagnosis present

## 2020-01-27 DIAGNOSIS — Z789 Other specified health status: Secondary | ICD-10-CM

## 2020-01-27 DIAGNOSIS — N050 Unspecified nephritic syndrome with minor glomerular abnormality: Secondary | ICD-10-CM | POA: Diagnosis present

## 2020-01-27 DIAGNOSIS — Z20822 Contact with and (suspected) exposure to covid-19: Secondary | ICD-10-CM | POA: Diagnosis present

## 2020-01-27 DIAGNOSIS — R030 Elevated blood-pressure reading, without diagnosis of hypertension: Secondary | ICD-10-CM | POA: Diagnosis present

## 2020-01-27 DIAGNOSIS — D72829 Elevated white blood cell count, unspecified: Secondary | ICD-10-CM | POA: Diagnosis not present

## 2020-01-27 DIAGNOSIS — Z79899 Other long term (current) drug therapy: Secondary | ICD-10-CM

## 2020-01-27 DIAGNOSIS — N049 Nephrotic syndrome with unspecified morphologic changes: Principal | ICD-10-CM | POA: Diagnosis present

## 2020-01-27 DIAGNOSIS — Z9114 Patient's other noncompliance with medication regimen: Secondary | ICD-10-CM

## 2020-01-27 DIAGNOSIS — F17210 Nicotine dependence, cigarettes, uncomplicated: Secondary | ICD-10-CM | POA: Diagnosis present

## 2020-01-27 DIAGNOSIS — N390 Urinary tract infection, site not specified: Secondary | ICD-10-CM | POA: Diagnosis present

## 2020-01-27 DIAGNOSIS — E8809 Other disorders of plasma-protein metabolism, not elsewhere classified: Secondary | ICD-10-CM | POA: Diagnosis present

## 2020-01-27 DIAGNOSIS — Z87441 Personal history of nephrotic syndrome: Secondary | ICD-10-CM

## 2020-01-27 DIAGNOSIS — N19 Unspecified kidney failure: Secondary | ICD-10-CM

## 2020-01-27 DIAGNOSIS — I10 Essential (primary) hypertension: Secondary | ICD-10-CM | POA: Diagnosis present

## 2020-01-27 DIAGNOSIS — T501X6A Underdosing of loop [high-ceiling] diuretics, initial encounter: Secondary | ICD-10-CM | POA: Diagnosis present

## 2020-01-27 DIAGNOSIS — Z91018 Allergy to other foods: Secondary | ICD-10-CM

## 2020-01-27 HISTORY — DX: Nephrotic syndrome with unspecified morphologic changes: N04.9

## 2020-01-27 LAB — CBC WITH DIFFERENTIAL/PLATELET
Abs Immature Granulocytes: 0.04 10*3/uL (ref 0.00–0.07)
Basophils Absolute: 0.1 10*3/uL (ref 0.0–0.1)
Basophils Relative: 1 %
Eosinophils Absolute: 0.6 10*3/uL — ABNORMAL HIGH (ref 0.0–0.5)
Eosinophils Relative: 8 %
HCT: 34.4 % — ABNORMAL LOW (ref 39.0–52.0)
Hemoglobin: 11.8 g/dL — ABNORMAL LOW (ref 13.0–17.0)
Immature Granulocytes: 1 %
Lymphocytes Relative: 33 %
Lymphs Abs: 2.5 10*3/uL (ref 0.7–4.0)
MCH: 29.4 pg (ref 26.0–34.0)
MCHC: 34.3 g/dL (ref 30.0–36.0)
MCV: 85.8 fL (ref 80.0–100.0)
Monocytes Absolute: 0.4 10*3/uL (ref 0.1–1.0)
Monocytes Relative: 5 %
Neutro Abs: 3.9 10*3/uL (ref 1.7–7.7)
Neutrophils Relative %: 52 %
Platelets: 373 10*3/uL (ref 150–400)
RBC: 4.01 MIL/uL — ABNORMAL LOW (ref 4.22–5.81)
RDW: 11.7 % (ref 11.5–15.5)
WBC: 7.4 10*3/uL (ref 4.0–10.5)
nRBC: 0 % (ref 0.0–0.2)

## 2020-01-27 LAB — COMPREHENSIVE METABOLIC PANEL
ALT: 9 U/L (ref 0–44)
AST: 16 U/L (ref 15–41)
Albumin: <1 g/dL — ABNORMAL LOW (ref 3.5–5.0)
Alkaline Phosphatase: 53 U/L (ref 38–126)
Anion gap: 4 — ABNORMAL LOW (ref 5–15)
BUN: 45 mg/dL — ABNORMAL HIGH (ref 6–20)
CO2: 27 mmol/L (ref 22–32)
Calcium: 7.2 mg/dL — ABNORMAL LOW (ref 8.9–10.3)
Chloride: 109 mmol/L (ref 98–111)
Creatinine, Ser: 2.34 mg/dL — ABNORMAL HIGH (ref 0.61–1.24)
GFR calc Af Amer: 44 mL/min — ABNORMAL LOW (ref >60)
GFR calc non Af Amer: 38 mL/min — ABNORMAL LOW (ref >60)
Glucose, Bld: 103 mg/dL — ABNORMAL HIGH (ref 70–99)
Potassium: 4.8 mmol/L (ref 3.5–5.1)
Sodium: 140 mmol/L (ref 135–145)
Total Bilirubin: 0.5 mg/dL (ref 0.3–1.2)
Total Protein: 3.3 g/dL — ABNORMAL LOW (ref 6.5–8.1)

## 2020-01-27 LAB — TROPONIN I (HIGH SENSITIVITY): Troponin I (High Sensitivity): 5 ng/L (ref <18)

## 2020-01-27 NOTE — ED Triage Notes (Signed)
Patient reports worsening edema at legs , arms and face with fatigue and nausea this week , patient added intermittent chest pain , no SOB , denies fever or chills .

## 2020-01-28 ENCOUNTER — Encounter (HOSPITAL_COMMUNITY): Payer: Self-pay | Admitting: Internal Medicine

## 2020-01-28 ENCOUNTER — Inpatient Hospital Stay (HOSPITAL_COMMUNITY): Payer: Self-pay

## 2020-01-28 DIAGNOSIS — N179 Acute kidney failure, unspecified: Secondary | ICD-10-CM | POA: Diagnosis present

## 2020-01-28 DIAGNOSIS — I10 Essential (primary) hypertension: Secondary | ICD-10-CM | POA: Diagnosis present

## 2020-01-28 DIAGNOSIS — R030 Elevated blood-pressure reading, without diagnosis of hypertension: Secondary | ICD-10-CM | POA: Diagnosis present

## 2020-01-28 DIAGNOSIS — R601 Generalized edema: Secondary | ICD-10-CM | POA: Diagnosis present

## 2020-01-28 DIAGNOSIS — N050 Unspecified nephritic syndrome with minor glomerular abnormality: Secondary | ICD-10-CM | POA: Diagnosis present

## 2020-01-28 DIAGNOSIS — N049 Nephrotic syndrome with unspecified morphologic changes: Secondary | ICD-10-CM | POA: Insufficient documentation

## 2020-01-28 DIAGNOSIS — D649 Anemia, unspecified: Secondary | ICD-10-CM | POA: Diagnosis present

## 2020-01-28 LAB — HIV ANTIBODY (ROUTINE TESTING W REFLEX): HIV Screen 4th Generation wRfx: NONREACTIVE

## 2020-01-28 LAB — CBC
HCT: 33.8 % — ABNORMAL LOW (ref 39.0–52.0)
Hemoglobin: 11.5 g/dL — ABNORMAL LOW (ref 13.0–17.0)
MCH: 29.3 pg (ref 26.0–34.0)
MCHC: 34 g/dL (ref 30.0–36.0)
MCV: 86 fL (ref 80.0–100.0)
Platelets: 347 10*3/uL (ref 150–400)
RBC: 3.93 MIL/uL — ABNORMAL LOW (ref 4.22–5.81)
RDW: 11.9 % (ref 11.5–15.5)
WBC: 7.8 10*3/uL (ref 4.0–10.5)
nRBC: 0 % (ref 0.0–0.2)

## 2020-01-28 LAB — CREATININE, SERUM
Creatinine, Ser: 2.25 mg/dL — ABNORMAL HIGH (ref 0.61–1.24)
GFR calc Af Amer: 46 mL/min — ABNORMAL LOW (ref >60)
GFR calc non Af Amer: 40 mL/min — ABNORMAL LOW (ref >60)

## 2020-01-28 LAB — URINALYSIS, COMPLETE (UACMP) WITH MICROSCOPIC
Bilirubin Urine: NEGATIVE
Glucose, UA: NEGATIVE mg/dL
Ketones, ur: NEGATIVE mg/dL
Leukocytes,Ua: NEGATIVE
Nitrite: NEGATIVE
Protein, ur: >=300 mg/dL — AB
Specific Gravity, Urine: 1.01 (ref 1.005–1.030)
pH: 5 (ref 5.0–8.0)

## 2020-01-28 LAB — URINALYSIS, ROUTINE W REFLEX MICROSCOPIC
Bilirubin Urine: NEGATIVE
Glucose, UA: NEGATIVE mg/dL
Ketones, ur: NEGATIVE mg/dL
Leukocytes,Ua: NEGATIVE
Nitrite: NEGATIVE
Protein, ur: >=300 mg/dL — AB
Specific Gravity, Urine: 1.018 (ref 1.005–1.030)
pH: 5 (ref 5.0–8.0)

## 2020-01-28 LAB — SARS CORONAVIRUS 2 BY RT PCR (HOSPITAL ORDER, PERFORMED IN ~~LOC~~ HOSPITAL LAB): SARS Coronavirus 2: NEGATIVE

## 2020-01-28 LAB — CREATININE, URINE, RANDOM: Creatinine, Urine: 71.37 mg/dL

## 2020-01-28 LAB — SODIUM, URINE, RANDOM: Sodium, Ur: 90 mmol/L

## 2020-01-28 LAB — TSH: TSH: 3.916 u[IU]/mL (ref 0.350–4.500)

## 2020-01-28 LAB — TROPONIN I (HIGH SENSITIVITY): Troponin I (High Sensitivity): 5 ng/L (ref <18)

## 2020-01-28 LAB — D-DIMER, QUANTITATIVE: D-Dimer, Quant: 9.48 ug/mL-FEU — ABNORMAL HIGH (ref 0.00–0.50)

## 2020-01-28 LAB — PROTEIN / CREATININE RATIO, URINE
Creatinine, Urine: 71.04 mg/dL
Protein Creatinine Ratio: 10.63 mg/mg{Cre} — ABNORMAL HIGH (ref 0.00–0.15)
Total Protein, Urine: 755 mg/dL

## 2020-01-28 MED ORDER — ONDANSETRON HCL 4 MG PO TABS: 4.0000 mg | ORAL_TABLET | Freq: Four times a day (QID) | ORAL | Status: DC | PRN

## 2020-01-28 MED ORDER — FUROSEMIDE 10 MG/ML IJ SOLN
80.0000 mg | Freq: Three times a day (TID) | INTRAMUSCULAR | Status: DC
  Administered 2020-01-28 – 2020-01-29 (×2): 80 mg via INTRAVENOUS
  Filled 2020-01-28 (×2): qty 8

## 2020-01-28 MED ORDER — FUROSEMIDE 10 MG/ML IJ SOLN: 40.0000 mg | Freq: Two times a day (BID) | INTRAMUSCULAR | Status: DC

## 2020-01-28 MED ORDER — HEPARIN SODIUM (PORCINE) 5000 UNIT/ML IJ SOLN
5000.0000 [IU] | Freq: Three times a day (TID) | INTRAMUSCULAR | Status: AC
  Administered 2020-01-28 – 2020-01-30 (×9): 5000 [IU] via SUBCUTANEOUS
  Filled 2020-01-28 (×9): qty 1

## 2020-01-28 MED ORDER — PREDNISONE 50 MG PO TABS
60.0000 mg | ORAL_TABLET | Freq: Every day | ORAL | Status: DC
  Administered 2020-01-28 – 2020-02-02 (×6): 60 mg via ORAL
  Filled 2020-01-28 (×6): qty 1

## 2020-01-28 MED ORDER — HYDRALAZINE HCL 20 MG/ML IJ SOLN: 10.0000 mg | INTRAMUSCULAR | Status: DC | PRN

## 2020-01-28 MED ORDER — FUROSEMIDE 20 MG PO TABS
80.0000 mg | ORAL_TABLET | Freq: Once | ORAL | Status: AC
  Administered 2020-01-28: 80 mg via ORAL
  Filled 2020-01-28: qty 4

## 2020-01-28 MED ORDER — TECHNETIUM TO 99M ALBUMIN AGGREGATED
3.8000 | Freq: Once | INTRAVENOUS | Status: AC | PRN
  Administered 2020-01-28: 3.8 via INTRAVENOUS

## 2020-01-28 MED ORDER — FUROSEMIDE 10 MG/ML IJ SOLN
40.0000 mg | Freq: Three times a day (TID) | INTRAMUSCULAR | Status: DC
  Administered 2020-01-28 (×2): 40 mg via INTRAVENOUS
  Filled 2020-01-28 (×2): qty 4

## 2020-01-28 MED ORDER — ALBUMIN HUMAN 25 % IV SOLN
50.0000 g | Freq: Once | INTRAVENOUS | Status: AC
  Administered 2020-01-28: 50 g via INTRAVENOUS
  Filled 2020-01-28: qty 200

## 2020-01-28 MED ORDER — ONDANSETRON HCL 4 MG/2ML IJ SOLN: 4.0000 mg | Freq: Four times a day (QID) | INTRAMUSCULAR | Status: DC | PRN

## 2020-01-28 MED ORDER — PANTOPRAZOLE SODIUM 40 MG PO TBEC
40.0000 mg | DELAYED_RELEASE_TABLET | Freq: Every day | ORAL | Status: DC
  Administered 2020-01-28 – 2020-02-02 (×6): 40 mg via ORAL
  Filled 2020-01-28 (×6): qty 1

## 2020-01-28 NOTE — Plan of Care (Signed)

## 2020-01-28 NOTE — ED Provider Notes (Signed)
[facility] EMERGENCY DEPARTMENT Provider Note   CSN: [id number] Arrival date & time: 01/27/20  2005     History Chief Complaint  Patient presents with  . Leg Swelling    Edema    Mcihael Hinderman is a 24 y.o. male.  Patient has a history of renal disorder of unclear etiology.  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  Some mild shortness of breath with exertion had worsened as well.        Past Medical History:  Diagnosis Date  . Nephrotic syndrome     There are no problems to display for this patient.   Past Surgical History:  Procedure Laterality Date  . TONSILLECTOMY         Family History  Problem Relation Age of Onset  . Healthy Mother   . Healthy Father     Social History   Tobacco Use  . Smoking status: Light Tobacco Smoker  . Smokeless tobacco: Never Used  Vaping Use  . Vaping Use: Never used  Substance Use Topics  . Alcohol use: No  . Drug use: No    Home Medications Prior to Admission medications   Medication Sig Start Date End Date Taking? Authorizing Provider  furosemide (LASIX) 40 MG tablet Take 1 tablet (40 mg total) by mouth daily. 03/04/19 03/03/20  Meccariello, Bernita Raisin, DO  ibuprofen (ADVIL) 200 MG tablet Take 200-400 mg by mouth every 8 (eight) hours as needed for headache or mild pain.    [provider]  potassium chloride (K-DUR) 10 MEQ tablet Take 2 tablets (20 mEq total) by mouth daily. 03/04/19   Meccariello, Bernita Raisin, DO    Allergies    Eual Fines Marta Lamas oleracea]  Review of Systems   Review of Systems  All other systems reviewed and are negative.   Physical Exam Updated Vital Signs BP 134/90 (BP Location: Left Arm)   Pulse 78    Temp 98.3 F (36.8 C) (Oral)   Resp 16   Ht 5\' 10"  (1.778 m)   Wt 80 kg   SpO2 100%   BMI 25.31 kg/m   Physical Exam Vitals and nursing note reviewed.  Constitutional:      Appearance: He is well-developed.  HENT:     Head: Normocephalic and atraumatic.     Comments: Facial edema    Nose: No congestion or rhinorrhea.  Cardiovascular:     Rate and Rhythm: Normal rate.  Pulmonary:     Effort: Pulmonary effort is normal. No respiratory distress.  Abdominal:     General: There is no distension.  Musculoskeletal:        General: Normal range of motion.     Cervical back: Normal range of motion.     Right lower leg: Edema present.     Left lower leg: Edema present.  Skin:    Coloration: Skin is not jaundiced.  Neurological:     General: No focal deficit present.     Mental Status: He is alert.     ED Results / Procedures / Treatments   Labs (all labs ordered are listed, but only abnormal results are displayed) Labs Reviewed  CBC WITH DIFFERENTIAL/PLATELET - Abnormal; Notable for the following components:      Result  Value   RBC 4.01 (*)    Hemoglobin 11.8 (*)    HCT 34.4 (*)    Eosinophils Absolute 0.6 (*)    All other components within normal limits  COMPREHENSIVE METABOLIC PANEL - Abnormal; Notable for the following components:   Glucose, Bld 103 (*)    BUN 45 (*)    Creatinine, Ser 2.34 (*)    Calcium 7.2 (*)    Total Protein 3.3 (*)    Albumin <1.0 (*)    GFR calc non Af Amer 38 (*)    GFR calc Af Amer 44 (*)    Anion gap 4 (*)    All other components within normal limits  URINALYSIS, ROUTINE W REFLEX MICROSCOPIC - Abnormal; Notable for the following components:   APPearance CLOUDY (*)    Hgb urine dipstick MODERATE (*)    Protein, ur >=300 (*)    Bacteria, UA FEW (*)    All other components within normal limits  TROPONIN I (HIGH SENSITIVITY)  TROPONIN I (HIGH SENSITIVITY)    EKG EKG Interpretation  Date/Time:  Thursday January 27 2020 20:18:53  EDT Ventricular Rate:  79 PR Interval:  120 QRS Duration: 70 QT Interval:  368 QTC Calculation: 421 R Axis:   34 Text Interpretation: Normal sinus rhythm Normal ECG No acute changes Confirmed by Merrily Pew [id number]) on 01/28/2020 3:35:06 AM   Radiology DG Chest 2 View  Result Date: 01/27/2020 CLINICAL DATA:  Chest pain. Patient reports worsening peripheral edema. Shortness of breath. EXAM: CHEST - 2 VIEW COMPARISON:  Chest radiograph 03/04/2019 FINDINGS: Heart is normal in size. Small to moderate bilateral pleural effusions with compressive atelectasis. No pulmonary edema. No confluent airspace disease. No pneumothorax. No acute osseous abnormalities are seen. IMPRESSION: Small to moderate bilateral pleural effusions with compressive atelectasis. Electronically Signed   By: Keith Rake M.D.   On: 01/27/2020 21:03    Procedures Procedures (including critical care time)  Medications Ordered in ED Medications  furosemide (LASIX) tablet 80 mg (80 mg Oral Given 01/28/20 0409)    ED Course  I have reviewed the triage vital signs and the nursing notes.  Pertinent labs & imaging results that were available during my care of the patient were reviewed by me and considered in my medical decision making (see chart for details).    MDM Rules/Calculators/A&P                          Nephrotic syndrome. Significant bump in creatinine. Lasix given. Discussed with Dr. Posey Pronto, recommends hospitalization for diuresis.   Final Clinical Impression(s) / ED Diagnoses Final diagnoses:  Peripheral edema  Nephrotic syndrome    Rx / DC Orders ED Discharge Orders    None       Kelcy Laible, Corene Cornea, MD 01/28/20 0430

## 2020-01-28 NOTE — Consult Note (Signed)
Reason for Consult: AKI and recurrent Nephrotic Syndrome Referring Physician:  Sherral Hammers, MD  Philip Trevino is an 24 y.o. male without a significant PMH prior to August 2020 when he presented to our clinic to be evaluated for acute onset lower extremity edema.  Workup revealed nephrotic syndrome and he underwent renal biopsy on 03/24/19 which did not show any abnormalities on light microscopy but EM revealed extensive podocyte activation and effacement.  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  He was started on oral prednisone and had initial excellent response but had relapse during prednisone taper.  He admits to not taking his medications as prescribed, nor keeping follow up appointments due to severe financial trouble brought on by the pandemic and his kidney disease.  He has not taken prednisone in over a month and presented to [facility] ED 01/27/20 with increasing lower extremity edema and right-sided chest pain.  In the ED, labs were notable for Scr of 2.34 (had been 1.06 at his last office visit on 09/29/19), UA with >300 mg/dl protein, moderate blood, Hgb 11.8, albumin <1, K 4.8, Hgb 11.8, troponin I 5, and CXR showed bilateral infiltrates.  He was admitted and started on IV lasix and we were consulted to further evaluate and manage his AKI as well as recurrent nephrotic syndrome.    Trend in Creatinine: Creatinine, Ser  Date/Time Value Ref Range Status  01/28/2020 05:13 AM 2.25 (H) 0.61 - 1.24 mg/dL Final  01/27/2020 08:28 PM 2.34 (H) 0.61 - 1.24 mg/dL Final  03/04/2019 05:01 PM 0.95 0.61 - 1.24 mg/dL Final  03/02/2019 07:35 PM 0.94 0.61 - 1.24 mg/dL Final  08/26/2017 11:53 PM 1.03 0.61 - 1.24 mg/dL Final  08/06/2017 09:08 PM 1.06 0.61 - 1.24 mg/dL Final    PMH:   Past Medical History:  Diagnosis Date   Nephrotic syndrome     PSH:   Past Surgical History:  Procedure Laterality Date   TONSILLECTOMY      Allergies:  Allergies  Allergen Reactions   Broccoli [Brassica  Oleracea] Hives    CAULIFLOWER (allergic, also)    Medications:   Prior to Admission medications   Medication Sig Start Date End Date Taking? Authorizing Provider  furosemide (LASIX) 40 MG tablet Take 1 tablet (40 mg total) by mouth daily. Patient not taking: Reported on 01/28/2020 03/04/19 03/03/20  Meccariello, Bernita Raisin, DO  potassium chloride (K-DUR) 10 MEQ tablet Take 2 tablets (20 mEq total) by mouth daily. Patient not taking: Reported on 01/28/2020 03/04/19   Meccariello, Bernita Raisin, DO    Inpatient medications:  furosemide  40 mg Intravenous TID   heparin  5,000 Units Subcutaneous Q8H    Discontinued Meds:   Medications Discontinued During This Encounter  Medication Reason   ibuprofen (ADVIL) 200 MG tablet No longer needed (for PRN medications)   furosemide (LASIX) injection 40 mg     Social History:  reports that he has been smoking. He has never used smokeless tobacco. He reports that he does not drink alcohol and does not use drugs.  Family History:   Family History  Problem Relation Age of Onset   Healthy Mother    Healthy Father     Pertinent items are noted in HPI. Weight change:   Intake/Output Summary (Last 24 hours) at 01/28/2020 1551 Last data filed at 01/28/2020 1500 Gross per 24 hour  Intake 520 ml  Output 200 ml  Net 320 ml   BP (!) 130/99 (BP Location: Right Arm) Comment:  RN notified   Pulse 65    Temp 97.9 F (36.6 C) (Oral)    Resp 16    Ht 5\' 10"  (1.778 m)    Wt 89.8 kg    SpO2 100%    BMI 28.41 kg/m  Vitals:   01/28/20 0035 01/28/20 0737 01/28/20 1348 01/28/20 1400  BP: 134/90 122/88 (!) 130/99   Pulse: 78 66 65   Resp: 16 18 16    Temp: 98.3 F (36.8 C) 97.8 F (36.6 C) 97.9 F (36.6 C)   TempSrc: Oral Oral Oral   SpO2: 100% 100% 100%   Weight:    89.8 kg  Height:         General appearance: alert, cooperative, no distress and generalized anasarca with facial swelling as well Head: atraumatic, periorbital edema, as well swelling of  face/cheeks Resp: diminished breath sounds bibasilar Cardio: regular rate and rhythm, S1, S2 normal, no murmur, click, rub or gallop GI: soft, non-tender; bowel sounds normal; no masses,  no organomegaly Extremities: edema diffuse anasarca 3+  Labs: Basic Metabolic Panel: Recent Labs  Lab 01/27/20 2028 01/28/20 0513  NA 140  --   K 4.8  --   CL 109  --   CO2 27  --   GLUCOSE 103*  --   BUN 45*  --   CREATININE 2.34* 2.25*  ALBUMIN <1.0*  --   CALCIUM 7.2*  --    Liver Function Tests: Recent Labs  Lab 01/27/20 2028  AST 16  ALT 9  ALKPHOS 53  BILITOT 0.5  PROT 3.3*  ALBUMIN <1.0*   No results for input(s): LIPASE, AMYLASE in the last 168 hours. No results for input(s): AMMONIA in the last 168 hours. CBC: Recent Labs  Lab 01/27/20 2028 01/28/20 0513  WBC 7.4 7.8  NEUTROABS 3.9  --   HGB 11.8* 11.5*  HCT 34.4* 33.8*  MCV 85.8 86.0  PLT 373 347   PT/INR: @LABRCNTIP (inr:5) Cardiac Enzymes: )No results for input(s): CKTOTAL, CKMB, CKMBINDEX, TROPONINI in the last 168 hours. CBG: No results for input(s): GLUCAP in the last 168 hours.  Iron Studies: No results for input(s): IRON, TIBC, TRANSFERRIN, FERRITIN in the last 168 hours.  Xrays/Other Studies: DG Chest 2 View  Result Date: 01/27/2020 CLINICAL DATA:  Chest pain. Patient reports worsening peripheral edema. Shortness of breath. EXAM: CHEST - 2 VIEW COMPARISON:  Chest radiograph 03/04/2019 FINDINGS: Heart is normal in size. Small to moderate bilateral pleural effusions with compressive atelectasis. No pulmonary edema. No confluent airspace disease. No pneumothorax. No acute osseous abnormalities are seen. IMPRESSION: Small to moderate bilateral pleural effusions with compressive atelectasis. Electronically Signed   By: Keith Rake M.D.   On: 01/27/2020 21:03     Assessment/Plan: 1.  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   1. Will increase lasix to 80 mg iv tid and follow UOP and Scr 2. Resume prednisone 60 mg daily 3. Discussed need for repeat renal biopsy to ascertain etiology and possibly change medications accordingly.  He is amenable and have consulted IR 4. Will add PPI for GI prophylaxis 5. Hold off on aspirin due to pending renal biopsy. 2. AKI- presumably due to ischemic ATN in setting of nephrotic syndrome and anasarca.  Await renal biopsy on 01/31/20 3. Anemia - new since March.  Will check iron stores  and replete if needed.  No indication for ESA 4. HTN- improving with IV diuresis.   Governor Rooks Melodi Happel 01/28/2020, 3:51 PM

## 2020-01-28 NOTE — Progress Notes (Signed)
PROGRESS NOTE    Philip Trevino  DEY:[id number] DOB: 1995-11-22 DOA: 01/27/2020 PCP: Clent Demark, PA-C     Brief Narrative:  Philip Trevino is a 24 y.o. BM PMHx  nephrotic syndrome used to be on Lasix and prednisone which patient states has not been taking for 2 to 3 months because he was not able to afford and he was homeless.  Presents to the ER because of increasing peripheral edema with right-sided chest pain starting happening from yesterday.  Pain is pleuritic in nature denies any fever chills.  Denies any nausea vomiting or diarrhea.  ED Course: In the ER patient was afebrile and on exam patient has anasarca with significant fluid in the abdomen lower extremity and facial puffiness.  Labs are remarkable for creatinine increasing from 0.95 and it is around 2.34 hemoglobin 11.8.  Chest x-ray shows bilateral infiltrates abdomen is not recordable.  UA is consistent with proteinuria.  High sensitive troponin was 5.5.  Covid test was negative.  EKG shows normal sinus rhythm.  ER physician discussed with on-call nephrologist Dr. Posey Pronto who advised giving Lasix and admitting for further management.    Subjective: A/O x4, negative CP, negative S OB, negative abdominal pain.   Assessment & Plan: Covid vaccination; negative vaccination   Principal Problem:   Anasarca Active Problems:   ARF (acute renal failure) (HCC)   Normochromic normocytic anemia   Elevated blood pressure reading   Minimal change disease   Anemia, unspecified   Benign essential HTN  Anasarca -See acute renal failure -Echocardiogram pending  Acute Kidney Injury/Hx Minimal-Change Disease -Patient had been on Lasix and prednisone.  Discussed case with pharmacy and they were unable to see any steroids on patient's inpatient or outpatient MAR. -7/8 albumin 50 g will administer prior to next dose of Lasix; patient's current albumin<1.0 -7/8 increase Lasix 40 mg IV TID -Discussed case with Dr Marval Regal Nephrology  and he has agreed to evaluate patient. -7/9 per nephrology increase Lasix IV 80 mg TID -7/9 per nephrology restart prednisone 60 mg daily -Strict in and out -Daily weight -Echocardiogram pending -Nephrology has ordered biopsy for 7/12  Hematuria   Normocytic anemia? -Appears to be new -Occult blood pending Recent Labs  Lab 01/27/20 2028 01/28/20 0513  HGB 11.8* 11.5*  -Anemia panel pending  RIGHT pleuritic chest pain/Elevated D-dimer -Multifactorial to include fluid overload, elevated BP.  PE? -Treat underlying problem -D-dimer= 9.48 -VQ scan negative for PE, -Bilateral lower extremity Doppler pending  Essential HTN -Hydralazine PRN -See AKI    DVT prophylaxis: Subcu heparin Code Status: Full Family Communication:  Status is: Inpatient    Dispo: The patient is from: Home              Anticipated d/c is to: Home              Anticipated d/c date is: 7/16              Patient currently       Consultants:  Dr. Donato Heinz nephrology    Procedures/Significant Events:  7/8 CXR;Small to moderate bilateral pleural effusions with compressive atelectasis. 7/9 VQ scan; low probability PE 7/9 echocardiogram;   I have personally reviewed and interpreted all radiology studies and my findings are as above.  VENTILATOR SETTINGS:    Cultures 7/9 SARS coronavirus negative  Antimicrobials:    Devices    LINES / TUBES:      Continuous Infusions:   Objective: Vitals:   01/28/20 0737 01/28/20 1348  01/28/20 1400 01/28/20 1809  BP: 122/88 (!) 130/99  130/86  Pulse: 66 65  87  Resp: 18 16  17   Temp: 97.8 F (36.6 C) 97.9 F (36.6 C)  98.1 F (36.7 C)  TempSrc: Oral Oral  Oral  SpO2: 100% 100%  98%  Weight:   89.8 kg   Height:        Intake/Output Summary (Last 24 hours) at 01/28/2020 1910 Last data filed at 01/28/2020 1700 Gross per 24 hour  Intake 520 ml  Output 500 ml  Net 20 ml   Filed Weights   01/27/20 2014 01/28/20 1400   Weight: 80 kg 89.8 kg    Examination:  General: A/O x4, no acute respiratory distress Eyes: negative scleral hemorrhage, negative anisocoria, negative icterus ENT: Negative Runny nose, negative gingival bleeding, Neck:  Negative scars, masses, torticollis, lymphadenopathy, JVD Lungs: Clear to auscultation bilaterally without wheezes or crackles Cardiovascular: Regular rate and rhythm without murmur gallop or rub normal S1 and S2 Abdomen: negative abdominal pain, positive distention,  positive soft, bowel sounds, no rebound, positive ascites, no appreciable mass Extremities: positive anasarca skin: Negative rashes, lesions, ulcers Psychiatric:  Negative depression, negative anxiety, negative fatigue, negative mania  Central nervous system:  Cranial nerves II through XII intact, tongue/uvula midline, all extremities muscle strength 5/5, sensation intact throughout, negative dysarthria, negative expressive aphasia, negative receptive aphasia.  .     Data Reviewed: Care during the described time interval was provided by me .  I have reviewed this patient's available data, including medical history, events of note, physical examination, and all test results as part of my evaluation.  CBC: Recent Labs  Lab 01/27/20 2028 01/28/20 0513  WBC 7.4 7.8  NEUTROABS 3.9  --   HGB 11.8* 11.5*  HCT 34.4* 33.8*  MCV 85.8 86.0  PLT 373 834   Basic Metabolic Panel: Recent Labs  Lab 01/27/20 2028 01/28/20 0513  NA 140  --   K 4.8  --   CL 109  --   CO2 27  --   GLUCOSE 103*  --   BUN 45*  --   CREATININE 2.34* 2.25*  CALCIUM 7.2*  --    GFR: Estimated Creatinine Clearance: 57.6 mL/min (A) (by C-G formula based on SCr of 2.25 mg/dL (H)). Liver Function Tests: Recent Labs  Lab 01/27/20 2028  AST 16  ALT 9  ALKPHOS 53  BILITOT 0.5  PROT 3.3*  ALBUMIN <1.0*   No results for input(s): LIPASE, AMYLASE in the last 168 hours. No results for input(s): AMMONIA in the last 168  hours. Coagulation Profile: No results for input(s): INR, PROTIME in the last 168 hours. Cardiac Enzymes: No results for input(s): CKTOTAL, CKMB, CKMBINDEX, TROPONINI in the last 168 hours. BNP (last 3 results) No results for input(s): PROBNP in the last 8760 hours. HbA1C: No results for input(s): HGBA1C in the last 72 hours. CBG: No results for input(s): GLUCAP in the last 168 hours. Lipid Profile: No results for input(s): CHOL, HDL, LDLCALC, TRIG, CHOLHDL, LDLDIRECT in the last 72 hours. Thyroid Function Tests: Recent Labs    01/28/20 0514  TSH 3.916   Anemia Panel: No results for input(s): VITAMINB12, FOLATE, FERRITIN, TIBC, IRON, RETICCTPCT in the last 72 hours. Sepsis Labs: No results for input(s): PROCALCITON, LATICACIDVEN in the last 168 hours.  Recent Results (from the past 240 hour(s))  SARS Coronavirus 2 by RT PCR (hospital order, performed in [facility] hospital lab) Nasopharyngeal Nasopharyngeal Swab  Status: None   Collection Time: 01/28/20  4:49 AM   Specimen: Nasopharyngeal Swab  Result Value Ref Range Status   SARS Coronavirus 2 NEGATIVE NEGATIVE Final    Comment: (NOTE) SARS-CoV-2 target nucleic acids are NOT DETECTED.  The SARS-CoV-2 RNA is generally detectable in upper and lower respiratory specimens during the acute phase of infection. The lowest concentration of SARS-CoV-2 viral copies this assay can detect is 250 copies / mL. A negative result does not preclude SARS-CoV-2 infection and should not be used as the sole basis for treatment or other patient management decisions.  A negative result may occur with improper specimen collection / handling, submission of specimen other than nasopharyngeal swab, presence of viral mutation(s) within the areas targeted by this assay, and inadequate number of viral copies (<250 copies / mL). A negative result must be combined with clinical observations, patient history, and epidemiological information.  Fact  Sheet for Patients:   StrictlyIdeas.no  Fact Sheet for Healthcare Providers: BankingDealers.co.za  This test is not yet approved or  cleared by the [place] FDA and has been authorized for detection and/or diagnosis of SARS-CoV-2 by FDA under an Emergency Use Authorization (EUA).  This EUA will remain in effect (meaning this test can be used) for the duration of the COVID-19 declaration under Section 564(b)(1) of the Act, 21 U.S.C. section 360bbb-3(b)(1), unless the authorization is terminated or revoked sooner.  Performed at [facility], [place] [address], [place], [place] [address]          Radiology Studies: DG Chest 2 View  Result Date: 01/27/2020 CLINICAL DATA:  Chest pain. Patient reports worsening peripheral edema. Shortness of breath. EXAM: CHEST - 2 VIEW COMPARISON:  Chest radiograph 03/04/2019 FINDINGS: Heart is normal in size. Small to moderate bilateral pleural effusions with compressive atelectasis. No pulmonary edema. No confluent airspace disease. No pneumothorax. No acute osseous abnormalities are seen. IMPRESSION: Small to moderate bilateral pleural effusions with compressive atelectasis. Electronically Signed   By: Keith Rake M.D.   On: 01/27/2020 21:03   NM Pulmonary Perf and Vent  Result Date: 01/28/2020 CLINICAL DATA:  Nephrotic syndrome, anasarca, elevated D-dimer, lower extremity edema, right-sided chest pain EXAM: NUCLEAR MEDICINE PERFUSION LUNG SCAN TECHNIQUE: Perfusion images were obtained in multiple projections after intravenous injection of radiopharmaceutical. Ventilation scans intentionally deferred if perfusion scan and chest x-ray adequate for interpretation during COVID 19 epidemic. RADIOPHARMACEUTICALS:  3.8 mCi Tc-36m MAA IV COMPARISON:  01/27/2020 FINDINGS: Planar images of the chest are obtained in multiple projections during the perfusion phase of the examination. There are no perfusion  defects identified. No evidence of pulmonary embolus. Photopenia at the costophrenic angles compatible with a pleural effusion seen on chest x-ray. IMPRESSION: 1. No evidence of pulmonary embolism, very low probability perfusion scan. Electronically Signed   By: Randa Ngo M.D.   On: 01/28/2020 15:52        Scheduled Meds: . furosemide  80 mg Intravenous TID  . heparin  5,000 Units Subcutaneous Q8H  . pantoprazole  40 mg Oral Daily  . predniSONE  60 mg Oral Q breakfast   Continuous Infusions:   LOS: 0 days    Time spent:40 min    Sriman Tally, Geraldo Docker, MD [facility] Pager [phone]  If 7PM-7AM, please contact night-coverage [email or web address] Password [email or web address] 01/28/2020, 7:10 PM

## 2020-01-28 NOTE — Plan of Care (Signed)
  Problem: Clinical Measurements: Goal: Ability to maintain clinical measurements within normal limits will improve 01/28/2020 0857 by Everlean Patterson, RN Outcome: Not Progressing 01/28/2020 0856 by Everlean Patterson, RN Outcome: Progressing   Problem: Education: Goal: Knowledge of General Education information will improve Description: Including pain rating scale, medication(s)/side effects and non-pharmacologic comfort measures 01/28/2020 0857 by Everlean Patterson, RN Outcome: Progressing 01/28/2020 0856 by Everlean Patterson, RN Outcome: Progressing

## 2020-01-28 NOTE — Consult Note (Signed)
Chief Complaint: Patient was seen in consultation today for nephrotic syndrome and AKI/random renal biopsy.  Referring Physician(s): Donato Heinz  Supervising Physician: Arne Cleveland  Patient Status: [facility] - In-pt  History of Present Illness: Philip Trevino is a 24 y.o. male with a past medical history of nephrotic syndrome. Of note, patient was diagnosed with nephrotic syndrome in 2020 and underwent an image-guided random renal biopsy in IR 03/23/2019, results non-diagnostic. He was on Lasix/Prednisone for management of this. He presented to [facility] ED 01/27/2020 due to progressive peripheral edema and right-sided chest pain x 1 day. Per chart review, patient has not taken Lasix/Prednisone for approximately 1 month due to inability to afford medications. In ED, Creatinine up to 2.34 (from 0.95), indicating AKI; and UA revealing proteinuria. He was admitted for further management. Nephrology was consulted who recommended initiation of Lasix along with IR consultation for possible repeat random renal biopsy for further information.  IR requested by Dr. Marval Regal for possible image-guided random renal biopsy. Patient awake and alert sitting in bed. Complains of right-sided chest pain with associated dyspnea, stable since admission. Denies fever, chills, abdominal pain, or headache.   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 Authorizing Provider  furosemide (LASIX) 40 MG tablet Take 1 tablet (40 mg total) by mouth daily. Patient not taking: Reported on 01/28/2020 03/04/19 03/03/20  Meccariello, Bernita Raisin, DO  potassium chloride (K-DUR) 10 MEQ tablet Take 2 tablets (20 mEq total) by mouth daily. Patient not taking: Reported on 01/28/2020 03/04/19   Meccariello,  Bernita Raisin, DO     Family History  Problem Relation Age of Onset   Healthy Mother    Healthy Father     Social History   Socioeconomic History   Marital status: Significant Other    Spouse name: Not on file   Number of children: Not on file   Years of education: Not on file   Highest education level: Not on file  Occupational History   Not on file  Tobacco Use   Smoking status: Light Tobacco Smoker   Smokeless tobacco: Never Used  Vaping Use   Vaping Use: Never used  Substance and Sexual Activity   Alcohol use: No   Drug use: No   Sexual activity: Not on file  Other Topics Concern   Not on file  Social History Narrative   Not on file   Social Determinants of Health   Financial Resource Strain:    Difficulty of Paying Living Expenses:   Food Insecurity:    Worried About Charity fundraiser in the Last Year:    Arboriculturist in the Last Year:   Transportation Needs:    Film/video editor (Medical):    Lack of Transportation (Non-Medical):   Physical Activity:    Days of Exercise per Week:    Minutes of Exercise per Session:   Stress:    Feeling of Stress :   Social Connections:    Frequency of Communication with Friends and Family:    Frequency of Social Gatherings with Friends and Family:    Attends Religious Services:    Active Member of Clubs or Organizations:    Attends Archivist Meetings:    Marital Status:  Review of Systems: A 12 point ROS discussed and pertinent positives are indicated in the HPI above.  All other systems are negative.  Review of Systems  Constitutional: Negative for chills and fever.  Respiratory: Positive for shortness of breath. Negative for wheezing.   Cardiovascular: Positive for chest pain. Negative for palpitations.  Gastrointestinal: Negative for abdominal pain.  Neurological: Negative for headaches.  Psychiatric/Behavioral: Negative for behavioral problems and confusion.      Vital Signs: BP (!) 130/99 (BP Location: Right Arm) Comment: RN notified   Pulse 65    Temp 97.9 F (36.6 C) (Oral)    Resp 16    Ht 5\' 10"  (1.778 m)    Wt 198 lb (89.8 kg)    SpO2 100%    BMI 28.41 kg/m   Physical Exam Vitals and nursing note reviewed.  Constitutional:      General: He is not in acute distress.    Appearance: Normal appearance.  Cardiovascular:     Rate and Rhythm: Normal rate and regular rhythm.     Heart sounds: Normal heart sounds. No murmur heard.   Pulmonary:     Effort: Pulmonary effort is normal. No respiratory distress.     Breath sounds: Normal breath sounds. No wheezing.  Skin:    General: Skin is warm and dry.  Neurological:     Mental Status: He is alert and oriented to person, place, and time.      MD Evaluation Airway: WNL Heart: WNL Abdomen: WNL Chest/ Lungs: WNL ASA  Classification: 2 Mallampati/Airway Score: One   Imaging: DG Chest 2 View  Result Date: 01/27/2020 CLINICAL DATA:  Chest pain. Patient reports worsening peripheral edema. Shortness of breath. EXAM: CHEST - 2 VIEW COMPARISON:  Chest radiograph 03/04/2019 FINDINGS: Heart is normal in size. Small to moderate bilateral pleural effusions with compressive atelectasis. No pulmonary edema. No confluent airspace disease. No pneumothorax. No acute osseous abnormalities are seen. IMPRESSION: Small to moderate bilateral pleural effusions with compressive atelectasis. Electronically Signed   By: Keith Rake M.D.   On: 01/27/2020 21:03    Labs:  CBC: Recent Labs    03/04/19 1701 03/23/19 0620 01/27/20 2028 01/28/20 0513  WBC 7.3 6.3 7.4 7.8  HGB 15.1 13.7 11.8* 11.5*  HCT 42.9 40.2 34.4* 33.8*  PLT 311 261 373 347    COAGS: Recent Labs    03/23/19 0620  INR 1.1    BMP: Recent Labs    03/02/19 1935 03/04/19 1701 01/27/20 2028 01/28/20 0513  NA 141 141 140  --   K 3.4* 3.6 4.8  --   CL 109 109 109  --   CO2 27 26 27   --   GLUCOSE 101* 85 103*  --   BUN  9 11 45*  --   CALCIUM 7.9* 8.0* 7.2*  --   CREATININE 0.94 0.95 2.34* 2.25*  GFRNONAA >60 >60 38* 40*  GFRAA >60 >60 44* 46*    LIVER FUNCTION TESTS: Recent Labs    03/02/19 1935 01/27/20 2028  BILITOT 0.2* 0.5  AST 20 16  ALT 16 9  ALKPHOS 76 53  PROT 4.2* 3.3*  ALBUMIN 1.8* <1.0*     Assessment and Plan:  Nephrotic syndrome (recurrent) and AKI without known cause; history of random renal biopsy in IR 03/23/2019, results non-diagnostic. Plan for image-guided random renal biopsy tentatively for Monday 01/31/2020 in IR pending IR scheduling. Case has been reviewed by Dr. Vernard Gambles who approves procedure. Patient will be NPO at  midnight prior to procedure. Afebrile and WBCs WNL. Will hold Heparin per IR protocol. INR pending.  Risks and benefits discussed with the patient including, but not limited to bleeding, infection, damage to adjacent structures or low yield requiring additional tests. All of the patient's questions were answered, patient is agreeable to proceed. Consent signed and in chart.   Thank you for this interesting consult.  I greatly enjoyed meeting Philip Trevino and look forward to participating in their care.  A copy of this report was sent to the requesting provider on this date.  Electronically Signed: Earley Abide, PA-C 01/28/2020, 2:14 PM   I spent a total of 40 Minutes in face to face in clinical consultation, greater than 50% of which was counseling/coordinating care for nephrotic syndrome and AKI/random renal biopsy.

## 2020-01-28 NOTE — H&P (Signed)
History and Physical    Philip Trevino OQH:[id number] DOB: [birth year] DOA: 01/27/2020  PCP: Clent Demark, PA-C  Patient coming from: Patient is homeless.  Chief Complaint: Increased edema and right-sided chest pain.  HPI: Philip Trevino is a 24 y.o. male with history of nephrotic syndrome used to be on Lasix and prednisone which patient states has not been taking for the last 1 month because he was not able to afford and he was homeless.  Presents to the ER because of increasing peripheral edema with right-sided chest pain starting happening from yesterday.  Pain is pleuritic in nature denies any fever chills.  Denies any nausea vomiting or diarrhea.  ED Course: In the ER patient was afebrile and on exam patient has anasarca with significant fluid in the abdomen lower extremity and facial puffiness.  Labs are remarkable for creatinine increasing from 0.95 and it is around 2.34 hemoglobin 11.8.  Chest x-ray shows bilateral infiltrates abdomen is not recordable.  UA is consistent with proteinuria.  High sensitive troponin was 5.5.  Covid test was negative.  EKG shows normal sinus rhythm.  ER physician discussed with on-call nephrologist Dr. Posey Pronto who advised giving Lasix and admitting for further management.  Review of Systems: As per HPI, rest all negative.   Past Medical History:  Diagnosis Date  . Nephrotic syndrome     Past Surgical History:  Procedure Laterality Date  . TONSILLECTOMY       reports that he has been smoking. He has never used smokeless tobacco. He reports that he does not drink alcohol and does not use drugs.  Allergies  Allergen Reactions  . Broccoli [Brassica Oleracea] Hives    CAULIFLOWER (allergic, also)    Family History  Problem Relation Age of Onset  . Healthy Mother   . Healthy Father     Prior to Admission medications   Medication Sig Start Date End Date Taking? Authorizing Provider  furosemide (LASIX) 40 MG tablet Take 1 tablet (40 mg total)  by mouth daily. Patient not taking: Reported on 01/28/2020 03/04/19 03/03/20  Meccariello, Bernita Raisin, DO  potassium chloride (K-DUR) 10 MEQ tablet Take 2 tablets (20 mEq total) by mouth daily. Patient not taking: Reported on 01/28/2020 03/04/19   Meccariello, Bernita Raisin, DO    Physical Exam: Constitutional: Moderately built and nourished. Vitals:   01/27/20 2012 01/27/20 2014 01/28/20 0035  BP: (!) 130/100  134/90  Pulse: 77  78  Resp: 16  16  Temp: 98.7 F (37.1 C)  98.3 F (36.8 C)  TempSrc: Oral  Oral  SpO2: 100%  100%  Weight:  80 kg   Height:  5\' 10"  (1.778 m)    Eyes: Anicteric no pallor. ENMT: No discharge from the ears eyes nose or mouth. Neck: No mass felt.  No neck rigidity. Respiratory: No rhonchi or crepitations. Cardiovascular: S1-S2 heard. Abdomen: Distended nontender bowel sounds present. Musculoskeletal: Bilateral lower extremity edema extending up to the thighs. Skin: No rash. Neurologic: Alert awake oriented time place and person.  Moves all extremities. Psychiatric: Appears normal per normal affect.   Labs on Admission: I have personally reviewed following labs and imaging studies  CBC: Recent Labs  Lab 01/27/20 2028  WBC 7.4  NEUTROABS 3.9  HGB 11.8*  HCT 34.4*  MCV 85.8  PLT 546   Basic Metabolic Panel: Recent Labs  Lab 01/27/20 2028  NA 140  K 4.8  CL 109  CO2 27  GLUCOSE 103*  BUN 45*  CREATININE 2.34*  CALCIUM 7.2*   GFR: Estimated Creatinine Clearance: 50.7 mL/min (A) (by C-G formula based on SCr of 2.34 mg/dL (H)). Liver Function Tests: Recent Labs  Lab 01/27/20 2028  AST 16  ALT 9  ALKPHOS 53  BILITOT 0.5  PROT 3.3*  ALBUMIN <1.0*   No results for input(s): LIPASE, AMYLASE in the last 168 hours. No results for input(s): AMMONIA in the last 168 hours. Coagulation Profile: No results for input(s): INR, PROTIME in the last 168 hours. Cardiac Enzymes: No results for input(s): CKTOTAL, CKMB, CKMBINDEX, TROPONINI in the last 168  hours. BNP (last 3 results) No results for input(s): PROBNP in the last 8760 hours. HbA1C: No results for input(s): HGBA1C in the last 72 hours. CBG: No results for input(s): GLUCAP in the last 168 hours. Lipid Profile: No results for input(s): CHOL, HDL, LDLCALC, TRIG, CHOLHDL, LDLDIRECT in the last 72 hours. Thyroid Function Tests: No results for input(s): TSH, T4TOTAL, FREET4, T3FREE, THYROIDAB in the last 72 hours. Anemia Panel: No results for input(s): VITAMINB12, FOLATE, FERRITIN, TIBC, IRON, RETICCTPCT in the last 72 hours. Urine analysis:    Component Value Date/Time   COLORURINE YELLOW 01/28/2020 0348   APPEARANCEUR CLOUDY (A) 01/28/2020 0348   LABSPEC 1.018 01/28/2020 0348   PHURINE 5.0 01/28/2020 0348   GLUCOSEU NEGATIVE 01/28/2020 0348   HGBUR MODERATE (A) 01/28/2020 0348   BILIRUBINUR NEGATIVE 01/28/2020 0348   KETONESUR NEGATIVE 01/28/2020 0348   PROTEINUR >=300 (A) 01/28/2020 0348   UROBILINOGEN 1.0 03/02/2019 1944   NITRITE NEGATIVE 01/28/2020 0348   LEUKOCYTESUR NEGATIVE 01/28/2020 0348   Sepsis Labs: @LABRCNTIP (procalcitonin:4,lacticidven:4) )No results found for this or any previous visit (from the past 240 hour(s)).   Radiological Exams on Admission: DG Chest 2 View  Result Date: 01/27/2020 CLINICAL DATA:  Chest pain. Patient reports worsening peripheral edema. Shortness of breath. EXAM: CHEST - 2 VIEW COMPARISON:  Chest radiograph 03/04/2019 FINDINGS: Heart is normal in size. Small to moderate bilateral pleural effusions with compressive atelectasis. No pulmonary edema. No confluent airspace disease. No pneumothorax. No acute osseous abnormalities are seen. IMPRESSION: Small to moderate bilateral pleural effusions with compressive atelectasis. Electronically Signed   By: Keith Rake M.D.   On: 01/27/2020 21:03    EKG: Independently reviewed.  Normal sinus rhythm.  Assessment/Plan Principal Problem:   Anasarca Active Problems:   ARF (acute renal  failure) (HCC)   Normochromic normocytic anemia   Elevated blood pressure reading    1. Anasarca with history of nephrotic syndrome presently not taking his medications because he was not able to afford.  ER physician had discussed with on-call nephrologist Dr. Posey Pronto who advised to give Lasix and the ER physician ordered Lasix 80 mg p.o. 1 dose.  I have placed patient on Lasix 40 mg IV every 12.  Consult nephrology in the morning.  Follow intake output and metabolic panel. 2. Acute renal failure with history of nephrotic syndrome presently on Lasix IV.  Will need nephrology input.  ER physician did discuss with on-call nephrologist.  Follow metabolic panel intake output closely. 3. Normocytic normochromic anemia appears to be new.  Likely from renal disease follow CBC. 4. Right-sided pleuritic chest pain likely from fluid overload.  Will check D-dimer given history of nephrotic syndrome. 5. Elevated blood pressure I have placed patient on as needed IV hydralazine for now follow blood pressure trends.  Since patient is acute renal failure with anasarca will need close monitoring for any further titration and inpatient status.   DVT prophylaxis:  Heparin. Code Status: Full code. Family Communication: Discussed with patient. Disposition Plan: Home. Consults called: ER physician discussed with nephrologist. Admission status: Inpatient.   Rise Patience MD [facility] Pager [phone].  If 7PM-7AM, please contact night-coverage [email or web address] Password [email or web address]  01/28/2020, 5:18 AM

## 2020-01-29 ENCOUNTER — Inpatient Hospital Stay (HOSPITAL_COMMUNITY): Payer: Self-pay

## 2020-01-29 DIAGNOSIS — I82409 Acute embolism and thrombosis of unspecified deep veins of unspecified lower extremity: Secondary | ICD-10-CM

## 2020-01-29 DIAGNOSIS — N050 Unspecified nephritic syndrome with minor glomerular abnormality: Secondary | ICD-10-CM

## 2020-01-29 DIAGNOSIS — I361 Nonrheumatic tricuspid (valve) insufficiency: Secondary | ICD-10-CM

## 2020-01-29 DIAGNOSIS — R609 Edema, unspecified: Secondary | ICD-10-CM

## 2020-01-29 LAB — COMPREHENSIVE METABOLIC PANEL
ALT: 7 U/L (ref 0–44)
AST: 13 U/L — ABNORMAL LOW (ref 15–41)
Albumin: 1.3 g/dL — ABNORMAL LOW (ref 3.5–5.0)
Alkaline Phosphatase: 43 U/L (ref 38–126)
Anion gap: 6 (ref 5–15)
BUN: 43 mg/dL — ABNORMAL HIGH (ref 6–20)
CO2: 25 mmol/L (ref 22–32)
Calcium: 7.7 mg/dL — ABNORMAL LOW (ref 8.9–10.3)
Chloride: 109 mmol/L (ref 98–111)
Creatinine, Ser: 2.38 mg/dL — ABNORMAL HIGH (ref 0.61–1.24)
GFR calc Af Amer: 43 mL/min — ABNORMAL LOW (ref >60)
GFR calc non Af Amer: 37 mL/min — ABNORMAL LOW (ref >60)
Glucose, Bld: 140 mg/dL — ABNORMAL HIGH (ref 70–99)
Potassium: 5.2 mmol/L — ABNORMAL HIGH (ref 3.5–5.1)
Sodium: 140 mmol/L (ref 135–145)
Total Bilirubin: 0.2 mg/dL — ABNORMAL LOW (ref 0.3–1.2)
Total Protein: 3.9 g/dL — ABNORMAL LOW (ref 6.5–8.1)

## 2020-01-29 LAB — IRON AND TIBC: Iron: 53 ug/dL (ref 45–182)

## 2020-01-29 LAB — CBC WITH DIFFERENTIAL/PLATELET
Abs Immature Granulocytes: 0 10*3/uL (ref 0.00–0.07)
Basophils Absolute: 0 10*3/uL (ref 0.0–0.1)
Basophils Relative: 0 %
Eosinophils Absolute: 0 10*3/uL (ref 0.0–0.5)
Eosinophils Relative: 0 %
HCT: 31.6 % — ABNORMAL LOW (ref 39.0–52.0)
Hemoglobin: 11.1 g/dL — ABNORMAL LOW (ref 13.0–17.0)
Lymphocytes Relative: 13 %
Lymphs Abs: 0.8 10*3/uL (ref 0.7–4.0)
MCH: 29.6 pg (ref 26.0–34.0)
MCHC: 35.1 g/dL (ref 30.0–36.0)
MCV: 84.3 fL (ref 80.0–100.0)
Monocytes Absolute: 0 10*3/uL — ABNORMAL LOW (ref 0.1–1.0)
Monocytes Relative: 0 %
Neutro Abs: 5.2 10*3/uL (ref 1.7–7.7)
Neutrophils Relative %: 87 %
Platelets: 371 10*3/uL (ref 150–400)
RBC: 3.75 MIL/uL — ABNORMAL LOW (ref 4.22–5.81)
RDW: 11.3 % — ABNORMAL LOW (ref 11.5–15.5)
WBC: 6 10*3/uL (ref 4.0–10.5)
nRBC: 0 % (ref 0.0–0.2)
nRBC: 0 /100 WBC

## 2020-01-29 LAB — ECHOCARDIOGRAM COMPLETE
Height: 70 in
Weight: 3188.8 oz

## 2020-01-29 LAB — VITAMIN B12: Vitamin B-12: 159 pg/mL — ABNORMAL LOW (ref 180–914)

## 2020-01-29 LAB — RETICULOCYTES
Immature Retic Fract: 3.9 % (ref 2.3–15.9)
RBC.: 3.79 MIL/uL — ABNORMAL LOW (ref 4.22–5.81)
Retic Count, Absolute: 29.6 10*3/uL (ref 19.0–186.0)
Retic Ct Pct: 0.8 % (ref 0.4–3.1)

## 2020-01-29 LAB — PHOSPHORUS: Phosphorus: 4.6 mg/dL (ref 2.5–4.6)

## 2020-01-29 LAB — PROTIME-INR
INR: 1.1 (ref 0.8–1.2)
Prothrombin Time: 13.7 seconds (ref 11.4–15.2)

## 2020-01-29 LAB — FOLATE: Folate: 5.5 ng/mL — ABNORMAL LOW (ref >5.9)

## 2020-01-29 LAB — MAGNESIUM: Magnesium: 1.9 mg/dL (ref 1.7–2.4)

## 2020-01-29 LAB — FERRITIN: Ferritin: 434 ng/mL — ABNORMAL HIGH (ref 24–336)

## 2020-01-29 MED ORDER — SODIUM CHLORIDE 0.9 % IV SOLN
2.0000 g | INTRAVENOUS | Status: DC
  Administered 2020-01-29 – 2020-02-01 (×4): 2 g via INTRAVENOUS
  Filled 2020-01-29 (×3): qty 20
  Filled 2020-01-29: qty 2
  Filled 2020-01-29: qty 20

## 2020-01-29 MED ORDER — FUROSEMIDE 10 MG/ML IJ SOLN
120.0000 mg | Freq: Three times a day (TID) | INTRAVENOUS | Status: DC
  Administered 2020-01-29 – 2020-02-01 (×10): 120 mg via INTRAVENOUS
  Filled 2020-01-29: qty 10
  Filled 2020-01-29: qty 12
  Filled 2020-01-29: qty 10
  Filled 2020-01-29: qty 12
  Filled 2020-01-29: qty 10
  Filled 2020-01-29: qty 12
  Filled 2020-01-29: qty 10
  Filled 2020-01-29: qty 12
  Filled 2020-01-29: qty 10
  Filled 2020-01-29: qty 12
  Filled 2020-01-29: qty 10
  Filled 2020-01-29: qty 12
  Filled 2020-01-29 (×2): qty 10

## 2020-01-29 MED ORDER — METOLAZONE 5 MG PO TABS
10.0000 mg | ORAL_TABLET | Freq: Three times a day (TID) | ORAL | Status: AC
  Administered 2020-01-29 – 2020-01-30 (×3): 10 mg via ORAL
  Filled 2020-01-29: qty 1
  Filled 2020-01-29 (×3): qty 2
  Filled 2020-01-29 (×2): qty 1

## 2020-01-29 NOTE — Progress Notes (Signed)
  Echocardiogram 2D Echocardiogram has been performed.  Marybelle Killings 01/29/2020, 3:11 PM

## 2020-01-29 NOTE — Progress Notes (Signed)
PROGRESS NOTE    Philip Trevino  HQI:[id number] DOB: 24-Jun-1996 DOA: 01/27/2020 PCP: Clent Demark, PA-C     Brief Narrative:  Philip Trevino is a 24 y.o. BM PMHx  nephrotic syndrome used to be on Lasix and prednisone which patient states has not been taking for 2 to 3 months because he was not able to afford and he was homeless.  Presents to the ER because of increasing peripheral edema with right-sided chest pain starting happening from yesterday.  Pain is pleuritic in nature denies any fever chills.  Denies any nausea vomiting or diarrhea.  ED Course: In the ER patient was afebrile and on exam patient has anasarca with significant fluid in the abdomen lower extremity and facial puffiness.  Labs are remarkable for creatinine increasing from 0.95 and it is around 2.34 hemoglobin 11.8.  Chest x-ray shows bilateral infiltrates abdomen is not recordable.  UA is consistent with proteinuria.  High sensitive troponin was 5.5.  Covid test was negative.  EKG shows normal sinus rhythm.  ER physician discussed with on-call nephrologist Dr. Posey Pronto who advised giving Lasix and admitting for further management.    Subjective: 7/10 afebrile overnight A/O x4, negative chest pain, negative S OB, negative abdominal pain.    A/O x4, negative CP, negative S OB, negative abdominal pain.   Assessment & Plan: Covid vaccination; negative vaccination   Principal Problem:   Anasarca Active Problems:   ARF (acute renal failure) (HCC)   Normochromic normocytic anemia   Elevated blood pressure reading   Minimal change disease   Anemia, unspecified   Benign essential HTN  Anasarca -See acute renal failure -Echocardiogram pending  Acute Kidney Injury/Hx Minimal-Change Disease -Patient had been on Lasix and prednisone.  Discussed case with pharmacy and they were unable to see any steroids on patient's inpatient or outpatient MAR. -7/8 albumin 50 g will administer prior to next dose of Lasix; patient's  current albumin<1.0 -7/8 increase Lasix 40 mg IV TID -Discussed case with Dr Marval Regal Nephrology and he has agreed to evaluate patient. -7/9 per nephrology increase Lasix IV 80 mg TID -7/9 per nephrology restart prednisone 60 mg daily -7/10 Zaroxolyn 10 mg x 3 dose -Strict in and out +442ml -Daily weight Filed Weights   01/27/20 2014 01/28/20 1400 01/29/20 0500  Weight: 80 kg 89.8 kg 90.4 kg  -Echocardiogram pending -Nephrology has ordered biopsy for 7/12 Recent Labs  Lab 01/27/20 2028 01/28/20 0513 01/29/20 0333  CREATININE 2.34* 2.25* 2.38*  -7/10 if patient does not start to make significant urine will place patient on Lasix drip  UTI -Urine culture pending -7/10 start empiric antibiotic coverage  Hematuria   Normocytic anemia? -Appears to be new -Occult blood pending Recent Labs  Lab 01/27/20 2028 01/28/20 0513 01/29/20 0333  HGB 11.8* 11.5* 11.1*  -Anemia panel pending  RIGHT pleuritic chest pain/Elevated D-dimer -Multifactorial to include fluid overload, elevated BP.  PE? -Treat underlying problem -D-dimer= 9.48 -VQ scan negative for PE, -Bilateral lower extremity Doppler pending  Essential HTN -Hydralazine PRN -See AKI    DVT prophylaxis: Subcu heparin Code Status: Full Family Communication:  Status is: Inpatient    Dispo: The patient is from: Home              Anticipated d/c is to: Home              Anticipated d/c date is: 7/16              Patient currently  Consultants:  Dr. Donato Heinz nephrology    Procedures/Significant Events:  7/8 CXR;Small to moderate bilateral pleural effusions with compressive atelectasis. 7/9 VQ scan; low probability PE 7/9 echocardiogram; 7/9 VQ scan; for PE   I have personally reviewed and interpreted all radiology studies and my findings are as above.  VENTILATOR SETTINGS:    Cultures 7/9 SARS coronavirus negative  Antimicrobials:    Devices    LINES / TUBES:       Continuous Infusions:   Objective: Vitals:   01/28/20 1809 01/28/20 2051 01/29/20 0424 01/29/20 0500  BP: 130/86 138/87 127/77   Pulse: 87 87 70   Resp: 17 17 15    Temp: 98.1 F (36.7 C) 97.7 F (36.5 C) 98 F (36.7 C)   TempSrc: Oral Oral Oral   SpO2: 98% 100% 99%   Weight:    90.4 kg  Height:        Intake/Output Summary (Last 24 hours) at 01/29/2020 1032 Last data filed at 01/29/2020 7416 Gross per 24 hour  Intake 1587 ml  Output 1130 ml  Net 457 ml   Filed Weights   01/27/20 2014 01/28/20 1400 01/29/20 0500  Weight: 80 kg 89.8 kg 90.4 kg    Examination:  General: A/O x4, no acute respiratory distress Eyes: negative scleral hemorrhage, negative anisocoria, negative icterus ENT: Negative Runny nose, negative gingival bleeding, Neck:  Negative scars, masses, torticollis, lymphadenopathy, JVD Lungs: Clear to auscultation bilaterally without wheezes or crackles Cardiovascular: Regular rate and rhythm without murmur gallop or rub normal S1 and S2 Abdomen: negative abdominal pain, positive distention,  positive soft, bowel sounds, no rebound, positive ascites, no appreciable mass Extremities: positive anasarca skin: Negative rashes, lesions, ulcers Psychiatric:  Negative depression, negative anxiety, negative fatigue, negative mania  Central nervous system:  Cranial nerves II through XII intact, tongue/uvula midline, all extremities muscle strength 5/5, sensation intact throughout, negative dysarthria, negative expressive aphasia, negative receptive aphasia.  .     Data Reviewed: Care during the described time interval was provided by me .  I have reviewed this patient's available data, including medical history, events of note, physical examination, and all test results as part of my evaluation.  CBC: Recent Labs  Lab 01/27/20 2028 01/28/20 0513 01/29/20 0333  WBC 7.4 7.8 6.0  NEUTROABS 3.9  --  5.2  HGB 11.8* 11.5* 11.1*  HCT 34.4* 33.8* 31.6*  MCV  85.8 86.0 84.3  PLT 373 347 384   Basic Metabolic Panel: Recent Labs  Lab 01/27/20 2028 01/28/20 0513 01/29/20 0333  NA 140  --  140  K 4.8  --  5.2*  CL 109  --  109  CO2 27  --  25  GLUCOSE 103*  --  140*  BUN 45*  --  43*  CREATININE 2.34* 2.25* 2.38*  CALCIUM 7.2*  --  7.7*  MG  --   --  1.9  PHOS  --   --  4.6   GFR: Estimated Creatinine Clearance: 54.6 mL/min (A) (by C-G formula based on SCr of 2.38 mg/dL (H)). Liver Function Tests: Recent Labs  Lab 01/27/20 2028 01/29/20 0333  AST 16 13*  ALT 9 7  ALKPHOS 53 43  BILITOT 0.5 0.2*  PROT 3.3* 3.9*  ALBUMIN <1.0* 1.3*   No results for input(s): LIPASE, AMYLASE in the last 168 hours. No results for input(s): AMMONIA in the last 168 hours. Coagulation Profile: Recent Labs  Lab 01/29/20 0333  INR 1.1   Cardiac Enzymes: No  results for input(s): CKTOTAL, CKMB, CKMBINDEX, TROPONINI in the last 168 hours. BNP (last 3 results) No results for input(s): PROBNP in the last 8760 hours. HbA1C: No results for input(s): HGBA1C in the last 72 hours. CBG: No results for input(s): GLUCAP in the last 168 hours. Lipid Profile: No results for input(s): CHOL, HDL, LDLCALC, TRIG, CHOLHDL, LDLDIRECT in the last 72 hours. Thyroid Function Tests: Recent Labs    01/28/20 0514  TSH 3.916   Anemia Panel: Recent Labs    01/29/20 0333  VITAMINB12 159*  FOLATE 5.5*  FERRITIN 434*  TIBC NOT CALCULATED  IRON 53  RETICCTPCT 0.8   Sepsis Labs: No results for input(s): PROCALCITON, LATICACIDVEN in the last 168 hours.  Recent Results (from the past 240 hour(s))  SARS Coronavirus 2 by RT PCR (hospital order, performed in [facility] hospital lab) Nasopharyngeal Nasopharyngeal Swab     Status: None   Collection Time: 01/28/20  4:49 AM   Specimen: Nasopharyngeal Swab  Result Value Ref Range Status   SARS Coronavirus 2 NEGATIVE NEGATIVE Final    Comment: (NOTE) SARS-CoV-2 target nucleic acids are NOT DETECTED.  The  SARS-CoV-2 RNA is generally detectable in upper and lower respiratory specimens during the acute phase of infection. The lowest concentration of SARS-CoV-2 viral copies this assay can detect is 250 copies / mL. A negative result does not preclude SARS-CoV-2 infection and should not be used as the sole basis for treatment or other patient management decisions.  A negative result may occur with improper specimen collection / handling, submission of specimen other than nasopharyngeal swab, presence of viral mutation(s) within the areas targeted by this assay, and inadequate number of viral copies (<250 copies / mL). A negative result must be combined with clinical observations, patient history, and epidemiological information.  Fact Sheet for Patients:   StrictlyIdeas.no  Fact Sheet for Healthcare Providers: BankingDealers.co.za  This test is not yet approved or  cleared by the [place] FDA and has been authorized for detection and/or diagnosis of SARS-CoV-2 by FDA under an Emergency Use Authorization (EUA).  This EUA will remain in effect (meaning this test can be used) for the duration of the COVID-19 declaration under Section 564(b)(1) of the Act, 21 U.S.C. section 360bbb-3(b)(1), unless the authorization is terminated or revoked sooner.  Performed at [facility], [place] [address], [place], [place] [address]          Radiology Studies: DG Chest 2 View  Result Date: 01/27/2020 CLINICAL DATA:  Chest pain. Patient reports worsening peripheral edema. Shortness of breath. EXAM: CHEST - 2 VIEW COMPARISON:  Chest radiograph 03/04/2019 FINDINGS: Heart is normal in size. Small to moderate bilateral pleural effusions with compressive atelectasis. No pulmonary edema. No confluent airspace disease. No pneumothorax. No acute osseous abnormalities are seen. IMPRESSION: Small to moderate bilateral pleural effusions with compressive  atelectasis. Electronically Signed   By: Keith Rake M.D.   On: 01/27/2020 21:03   NM Pulmonary Perf and Vent  Result Date: 01/28/2020 CLINICAL DATA:  Nephrotic syndrome, anasarca, elevated D-dimer, lower extremity edema, right-sided chest pain EXAM: NUCLEAR MEDICINE PERFUSION LUNG SCAN TECHNIQUE: Perfusion images were obtained in multiple projections after intravenous injection of radiopharmaceutical. Ventilation scans intentionally deferred if perfusion scan and chest x-ray adequate for interpretation during COVID 19 epidemic. RADIOPHARMACEUTICALS:  3.8 mCi Tc-68m MAA IV COMPARISON:  01/27/2020 FINDINGS: Planar images of the chest are obtained in multiple projections during the perfusion phase of the examination. There are no perfusion defects identified.  No evidence of pulmonary embolus. Photopenia at the costophrenic angles compatible with a pleural effusion seen on chest x-ray. IMPRESSION: 1. No evidence of pulmonary embolism, very low probability perfusion scan. Electronically Signed   By: Randa Ngo M.D.   On: 01/28/2020 15:52        Scheduled Meds: . furosemide  80 mg Intravenous TID  . heparin  5,000 Units Subcutaneous Q8H  . pantoprazole  40 mg Oral Daily  . predniSONE  60 mg Oral Q breakfast   Continuous Infusions:   LOS: 1 day    Time spent:40 min    Annamay Laymon, Geraldo Docker, MD [facility] Pager [phone]  If 7PM-7AM, please contact night-coverage [email or web address] Password [facility] 01/29/2020, 10:32 AM

## 2020-01-29 NOTE — Progress Notes (Signed)
Patient ID: Philip Trevino, male   DOB: [birth year], 24 y.o.   MRN: [id number] S: Complaining of HA this am.  Some improvement of UOP and edema with IV lasix. O:BP 127/77 (BP Location: Right Arm)   Pulse 70   Temp 98 F (36.7 C) (Oral)   Resp 15   Ht 5\' 10"  (1.778 m)   Wt 90.4 kg   SpO2 99%   BMI 28.60 kg/m   Intake/Output Summary (Last 24 hours) at 01/29/2020 1032 Last data filed at 01/29/2020 0934 Gross per 24 hour  Intake 1587 ml  Output 1130 ml  Net 457 ml   Intake/Output: I/O last 3 completed shifts: In: 1287 [P.O.:1287] Out: 1030 [Urine:1030]  Intake/Output this shift:  Total I/O In: 300 [P.O.:300] Out: 100 [Urine:100] Weight change: 9.812 kg Gen: NAD CVS: no rub Resp: cta Abd: benign Ext: 2+ anasarca  Recent Labs  Lab 01/27/20 2028 01/28/20 0513 01/29/20 0333  NA 140  --  140  K 4.8  --  5.2*  CL 109  --  109  CO2 27  --  25  GLUCOSE 103*  --  140*  BUN 45*  --  43*  CREATININE 2.34* 2.25* 2.38*  ALBUMIN <1.0*  --  1.3*  CALCIUM 7.2*  --  7.7*  PHOS  --   --  4.6  AST 16  --  13*  ALT 9  --  7   Liver Function Tests: Recent Labs  Lab 01/27/20 2028 01/29/20 0333  AST 16 13*  ALT 9 7  ALKPHOS 53 43  BILITOT 0.5 0.2*  PROT 3.3* 3.9*  ALBUMIN <1.0* 1.3*   No results for input(s): LIPASE, AMYLASE in the last 168 hours. No results for input(s): AMMONIA in the last 168 hours. CBC: Recent Labs  Lab 01/27/20 2028 01/28/20 0513 01/29/20 0333  WBC 7.4 7.8 6.0  NEUTROABS 3.9  --  5.2  HGB 11.8* 11.5* 11.1*  HCT 34.4* 33.8* 31.6*  MCV 85.8 86.0 84.3  PLT 373 347 371   Cardiac Enzymes: No results for input(s): CKTOTAL, CKMB, CKMBINDEX, TROPONINI in the last 168 hours. CBG: No results for input(s): GLUCAP in the last 168 hours.  Iron Studies:  Recent Labs    01/29/20 0333  IRON 53  TIBC NOT CALCULATED  FERRITIN 434*   Studies/Results: DG Chest 2 View  Result Date: 01/27/2020 CLINICAL DATA:  Chest pain. Patient reports worsening  peripheral edema. Shortness of breath. EXAM: CHEST - 2 VIEW COMPARISON:  Chest radiograph 03/04/2019 FINDINGS: Heart is normal in size. Small to moderate bilateral pleural effusions with compressive atelectasis. No pulmonary edema. No confluent airspace disease. No pneumothorax. No acute osseous abnormalities are seen. IMPRESSION: Small to moderate bilateral pleural effusions with compressive atelectasis. Electronically Signed   By: Keith Rake M.D.   On: 01/27/2020 21:03   NM Pulmonary Perf and Vent  Result Date: 01/28/2020 CLINICAL DATA:  Nephrotic syndrome, anasarca, elevated D-dimer, lower extremity edema, right-sided chest pain EXAM: NUCLEAR MEDICINE PERFUSION LUNG SCAN TECHNIQUE: Perfusion images were obtained in multiple projections after intravenous injection of radiopharmaceutical. Ventilation scans intentionally deferred if perfusion scan and chest x-ray adequate for interpretation during COVID 19 epidemic. RADIOPHARMACEUTICALS:  3.8 mCi Tc-88m MAA IV COMPARISON:  01/27/2020 FINDINGS: Planar images of the chest are obtained in multiple projections during the perfusion phase of the examination. There are no perfusion defects identified. No evidence of pulmonary embolus. Photopenia at the costophrenic angles compatible with a pleural effusion seen on chest x-ray. IMPRESSION:  1. No evidence of pulmonary embolism, very low probability perfusion scan. Electronically Signed   By: Randa Ngo M.D.   On: 01/28/2020 15:52   . furosemide  80 mg Intravenous TID  . heparin  5,000 Units Subcutaneous Q8H  . pantoprazole  40 mg Oral Daily  . predniSONE  60 mg Oral Q breakfast    BMET    Component Value Date/Time   NA 140 01/29/2020 0333   K 5.2 (H) 01/29/2020 0333   CL 109 01/29/2020 0333   CO2 25 01/29/2020 0333   GLUCOSE 140 (H) 01/29/2020 0333   BUN 43 (H) 01/29/2020 0333   CREATININE 2.38 (H) 01/29/2020 0333   CALCIUM 7.7 (L) 01/29/2020 0333   GFRNONAA 37 (L) 01/29/2020 0333   GFRAA 43  (L) 01/29/2020 0333   CBC    Component Value Date/Time   WBC 6.0 01/29/2020 0333   RBC 3.75 (L) 01/29/2020 0333   RBC 3.79 (L) 01/29/2020 0333   HGB 11.1 (L) 01/29/2020 0333   HCT 31.6 (L) 01/29/2020 0333   PLT 371 01/29/2020 0333   MCV 84.3 01/29/2020 0333   MCH 29.6 01/29/2020 0333   MCHC 35.1 01/29/2020 0333   RDW 11.3 (L) 01/29/2020 0333   LYMPHSABS 0.8 01/29/2020 0333   MONOABS 0.0 (L) 01/29/2020 0333   EOSABS 0.0 01/29/2020 0333   BASOSABS 0.0 01/29/2020 0333     Assessment/Plan: 1.  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   1. Will increase lasix to 120 mg iv tid and follow UOP and Scr 2. Resumed prednisone 60 mg daily 01/28/20 3. Discussed need for repeat renal biopsy to ascertain etiology and possibly change medications accordingly.  Plan for renal biopsy 01/31/20.   4. started PPI for GI prophylaxis 5. Hold off on aspirin due to pending renal biopsy. 2. AKI- presumably due to ischemic ATN in setting of nephrotic syndrome and anasarca.  Await renal biopsy on 01/31/20 3. Anemia - new since March.  Will check iron stores and replete if needed.  No indication for ESA 4. HTN- improving with IV diuresis.  Donetta Potts, MD [employer] [phone]

## 2020-01-30 ENCOUNTER — Inpatient Hospital Stay: Payer: Self-pay

## 2020-01-30 LAB — CBC WITH DIFFERENTIAL/PLATELET
Abs Immature Granulocytes: 0.11 10*3/uL — ABNORMAL HIGH (ref 0.00–0.07)
Basophils Absolute: 0 10*3/uL (ref 0.0–0.1)
Basophils Relative: 0 %
Eosinophils Absolute: 0 10*3/uL (ref 0.0–0.5)
Eosinophils Relative: 0 %
HCT: 29.8 % — ABNORMAL LOW (ref 39.0–52.0)
Hemoglobin: 10.8 g/dL — ABNORMAL LOW (ref 13.0–17.0)
Immature Granulocytes: 1 %
Lymphocytes Relative: 13 %
Lymphs Abs: 2.3 10*3/uL (ref 0.7–4.0)
MCH: 30 pg (ref 26.0–34.0)
MCHC: 36.2 g/dL — ABNORMAL HIGH (ref 30.0–36.0)
MCV: 82.8 fL (ref 80.0–100.0)
Monocytes Absolute: 1.1 10*3/uL — ABNORMAL HIGH (ref 0.1–1.0)
Monocytes Relative: 6 %
Neutro Abs: 14.4 10*3/uL — ABNORMAL HIGH (ref 1.7–7.7)
Neutrophils Relative %: 80 %
Platelets: 423 10*3/uL — ABNORMAL HIGH (ref 150–400)
RBC: 3.6 MIL/uL — ABNORMAL LOW (ref 4.22–5.81)
RDW: 11.5 % (ref 11.5–15.5)
WBC: 17.9 10*3/uL — ABNORMAL HIGH (ref 4.0–10.5)
nRBC: 0 % (ref 0.0–0.2)

## 2020-01-30 LAB — URINE CULTURE: Culture: NO GROWTH

## 2020-01-30 LAB — ANTI-DNA ANTIBODY, DOUBLE-STRANDED: ds DNA Ab: 1 IU/mL (ref 0–9)

## 2020-01-30 LAB — C3 COMPLEMENT: C3 Complement: 109 mg/dL (ref 82–167)

## 2020-01-30 LAB — COMPREHENSIVE METABOLIC PANEL
ALT: 7 U/L (ref 0–44)
AST: 13 U/L — ABNORMAL LOW (ref 15–41)
Albumin: 1 g/dL — ABNORMAL LOW (ref 3.5–5.0)
Alkaline Phosphatase: 38 U/L (ref 38–126)
Anion gap: 7 (ref 5–15)
BUN: 47 mg/dL — ABNORMAL HIGH (ref 6–20)
CO2: 23 mmol/L (ref 22–32)
Calcium: 7.7 mg/dL — ABNORMAL LOW (ref 8.9–10.3)
Chloride: 108 mmol/L (ref 98–111)
Creatinine, Ser: 2.42 mg/dL — ABNORMAL HIGH (ref 0.61–1.24)
GFR calc Af Amer: 42 mL/min — ABNORMAL LOW (ref >60)
GFR calc non Af Amer: 36 mL/min — ABNORMAL LOW (ref >60)
Glucose, Bld: 137 mg/dL — ABNORMAL HIGH (ref 70–99)
Potassium: 4.6 mmol/L (ref 3.5–5.1)
Sodium: 138 mmol/L (ref 135–145)
Total Bilirubin: 0.4 mg/dL (ref 0.3–1.2)
Total Protein: 3.2 g/dL — ABNORMAL LOW (ref 6.5–8.1)

## 2020-01-30 LAB — MAGNESIUM: Magnesium: 1.7 mg/dL (ref 1.7–2.4)

## 2020-01-30 LAB — MPO/PR-3 (ANCA) ANTIBODIES
ANCA Proteinase 3: <3.5 U/mL (ref 0.0–3.5)
Myeloperoxidase Abs: <9 U/mL (ref 0.0–9.0)

## 2020-01-30 LAB — COMPLEMENT, TOTAL: Compl, Total (CH50): 46 U/mL (ref >41)

## 2020-01-30 LAB — PHOSPHORUS: Phosphorus: 4.9 mg/dL — ABNORMAL HIGH (ref 2.5–4.6)

## 2020-01-30 LAB — ANTISTREPTOLYSIN O TITER: ASO: <20 IU/mL (ref 0.0–200.0)

## 2020-01-30 LAB — C4 COMPLEMENT: Complement C4, Body Fluid: 39 mg/dL — ABNORMAL HIGH (ref 12–38)

## 2020-01-30 MED ORDER — SODIUM CHLORIDE 0.9% FLUSH: 10.0000 mL | INTRAVENOUS | Status: DC | PRN

## 2020-01-30 MED ORDER — AMLODIPINE BESYLATE 5 MG PO TABS
5.0000 mg | ORAL_TABLET | Freq: Every day | ORAL | Status: DC
  Administered 2020-01-30 – 2020-01-31 (×2): 5 mg via ORAL
  Filled 2020-01-30 (×2): qty 1

## 2020-01-30 MED ORDER — ALBUMIN HUMAN 25 % IV SOLN
25.0000 g | Freq: Once | INTRAVENOUS | Status: AC
  Administered 2020-01-30: 25 g via INTRAVENOUS
  Filled 2020-01-30: qty 100

## 2020-01-30 MED ORDER — ALBUMIN HUMAN 25 % IV SOLN
25.0000 g | Freq: Once | INTRAVENOUS | Status: AC
  Administered 2020-01-30: 12.5 g via INTRAVENOUS
  Filled 2020-01-30: qty 100

## 2020-01-30 MED ORDER — DIPHENHYDRAMINE HCL 50 MG/ML IJ SOLN
12.5000 mg | Freq: Once | INTRAMUSCULAR | Status: AC
  Administered 2020-02-01: 12.5 mg via INTRAVENOUS
  Filled 2020-01-30: qty 1

## 2020-01-30 NOTE — Progress Notes (Signed)
PROGRESS NOTE    Philip Trevino  BDZ:[id number] DOB: [birth year] DOA: 01/27/2020 PCP: Clent Demark, PA-C     Brief Narrative:  Philip Trevino is a 24 y.o. BM PMHx  nephrotic syndrome used to be on Lasix and prednisone which patient states has not been taking for 2 to 3 months because he was not able to afford and he was homeless.  Presents to the ER because of increasing peripheral edema with right-sided chest pain starting happening from yesterday.  Pain is pleuritic in nature denies any fever chills.  Denies any nausea vomiting or diarrhea.  ED Course: In the ER patient was afebrile and on exam patient has anasarca with significant fluid in the abdomen lower extremity and facial puffiness.  Labs are remarkable for creatinine increasing from 0.95 and it is around 2.34 hemoglobin 11.8.  Chest x-ray shows bilateral infiltrates abdomen is not recordable.  UA is consistent with proteinuria.  High sensitive troponin was 5.5.  Covid test was negative.  EKG shows normal sinus rhythm.  ER physician discussed with on-call nephrologist Dr. Posey Pronto who advised giving Lasix and admitting for further management.    Subjective: 7/11 afebrile last 24 hours, A/O x4, negative CP, negative S OB, negative abdominal pain.  C/O pruritus around IV site.   Assessment & Plan: Covid vaccination; negative vaccination   Principal Problem:   Anasarca Active Problems:   ARF (acute renal failure) (HCC)   Normochromic normocytic anemia   Elevated blood pressure reading   Minimal change disease   Anemia, unspecified   Benign essential HTN  Anasarca -See acute renal failure -Echocardiogram echocardiogram see results below  Acute Kidney Injury/Hx Minimal-Change Disease -Patient had been on Lasix and prednisone.  Discussed case with pharmacy and they were unable to see any steroids on patient's inpatient or outpatient MAR. -7/8 albumin 50 g will administer prior to next dose of Lasix; patient's current  albumin<1.0 -Discussed case with Dr Marval Regal Nephrology and he has agreed to evaluate patient. -7/9 per nephrology restart prednisone 60 mg daily -7/10 Zaroxolyn 10 mg x 3 dose -7/11 albumin 25 g per nephrology, however patient's albumin 1.0 we will add an additional albumin 25 g, -7/11 per nephrology increase Lasix 120 mg TID -Strict in and out +264ml -Daily weight Filed Weights   01/27/20 2014 01/28/20 1400 01/29/20 0500  Weight: 80 kg 89.8 kg 90.4 kg  -Nephrology has ordered biopsy for 7/12 -Prednisone 60 mg daily Recent Labs  Lab 01/27/20 2028 01/28/20 0513 01/29/20 0333 01/30/20 0218  CREATININE 2.34* 2.25* 2.38* 2.42*  -7/10 if patient does not start to make significant urine will place patient on Lasix drip  UTI -Urine culture negative -7/10 start empiric antibiotic coverage  Hematuria   Normocytic anemia -Occult blood pending Recent Labs  Lab 01/27/20 2028 01/28/20 0513 01/29/20 0333 01/30/20 0218  HGB 11.8* 11.5* 11.1* 10.8*  -Anemia panel not consistent with iron deficiency anemia or macrocytic anemia, therefore by process of elimination will classify it as normocytic anemia  RIGHT pleuritic chest pain/Elevated D-dimer -Multifactorial to include fluid overload, elevated BP.  PE? -Treat underlying problem -D-dimer= 9.48 -VQ scan negative for PE, -7/10 Bilateral lower extremity Doppler; negative for DVT  Essential HTN -Hydralazine PRN -7/11 amlodipine 5 mg daily -See AKI  Leukocytosis -Reactive vs infection vs steroids. -Patient afebrile, no bands, negative left shift argues toward reactive anemia at this point  Pruritus around IV site -Benadryl 12.5 mg x 1 -Consult IV team for power glide placement or PICC line  DVT prophylaxis: Subcu heparin Code Status: Full Family Communication:  Status is: Inpatient    Dispo: The patient is from: Home              Anticipated d/c is to: Home              Anticipated d/c date is: 7/16               Patient currently       Consultants:  Dr. Donato Heinz nephrology    Procedures/Significant Events:  7/8 CXR;Small to moderate bilateral pleural effusions with compressive atelectasis. 7/9 VQ scan; low probability PE 7/10 Echocardiogram LVEF= 60 to 65%.        I have personally reviewed and interpreted all radiology studies and my findings are as above.  VENTILATOR SETTINGS:    Cultures 7/9 SARS coronavirus negative 7/10 urine; negative 7/11 blood pending  Antimicrobials: Anti-infectives (From admission, onward)   Start     Ordered Stop   01/29/20 1130  cefTRIAXone (ROCEPHIN) 2 g in sodium chloride 0.9 % 100 mL IVPB     Discontinue     01/29/20 1036         Devices    LINES / TUBES:      Continuous Infusions: . albumin human    . cefTRIAXone (ROCEPHIN)  IV 2 g (01/29/20 1201)  . furosemide Stopped (01/29/20 2336)     Objective: Vitals:   01/29/20 0500 01/29/20 1531 01/29/20 2154 01/30/20 0507  BP:  (!) 144/98 (!) 135/95 (!) 136/92  Pulse:  67 65 60  Resp:  18 18 17   Temp:  98.4 F (36.9 C) 98 F (36.7 C) 98 F (36.7 C)  TempSrc:  Oral Oral Oral  SpO2:  100% 100% 98%  Weight: 90.4 kg     Height:        Intake/Output Summary (Last 24 hours) at 01/30/2020 0929 Last data filed at 01/30/2020 9379 Gross per 24 hour  Intake 1381.96 ml  Output 1350 ml  Net 31.96 ml   Filed Weights   01/27/20 2014 01/28/20 1400 01/29/20 0500  Weight: 80 kg 89.8 kg 90.4 kg   Physical Exam:  General: A/O x4, No acute respiratory distress Eyes: negative scleral hemorrhage, negative anisocoria, negative icterus ENT: Negative Runny nose, negative gingival bleeding, Neck:  Negative scars, masses, torticollis, lymphadenopathy, JVD Lungs: Clear to auscultation bilaterally without wheezes or crackles Cardiovascular: Regular rate and rhythm without murmur gallop or rub normal S1 and S2 Abdomen: negative abdominal pain, positive distention nondistended,  positive soft, bowel sounds, no rebound, positive ascites, no appreciable mass Extremities: positive anasarca, pruritic around IV site.  Negative erythema, negative rubor, negative rash, negative blisters Skin: Negative rashes, lesions, ulcers Psychiatric:  Negative depression, negative anxiety, negative fatigue, negative mania  Central nervous system:  Cranial nerves II through XII intact, tongue/uvula midline, all extremities muscle strength 5/5, sensation intact throughout, negative dysarthria, negative expressive aphasia, negative receptive aphasia.  .     Data Reviewed: Care during the described time interval was provided by me .  I have reviewed this patient's available data, including medical history, events of note, physical examination, and all test results as part of my evaluation.  CBC: Recent Labs  Lab 01/27/20 2028 01/28/20 0513 01/29/20 0333 01/30/20 0218  WBC 7.4 7.8 6.0 17.9*  NEUTROABS 3.9  --  5.2 14.4*  HGB 11.8* 11.5* 11.1* 10.8*  HCT 34.4* 33.8* 31.6* 29.8*  MCV 85.8 86.0 84.3 82.8  PLT 373 347  371 599*   Basic Metabolic Panel: Recent Labs  Lab 01/27/20 2028 01/28/20 0513 01/29/20 0333 01/30/20 0218  NA 140  --  140 138  K 4.8  --  5.2* 4.6  CL 109  --  109 108  CO2 27  --  25 23  GLUCOSE 103*  --  140* 137*  BUN 45*  --  43* 47*  CREATININE 2.34* 2.25* 2.38* 2.42*  CALCIUM 7.2*  --  7.7* 7.7*  MG  --   --  1.9 1.7  PHOS  --   --  4.6 4.9*   GFR: Estimated Creatinine Clearance: 53.7 mL/min (A) (by C-G formula based on SCr of 2.42 mg/dL (H)). Liver Function Tests: Recent Labs  Lab 01/27/20 2028 01/29/20 0333 01/30/20 0218  AST 16 13* 13*  ALT 9 7 7   ALKPHOS 53 43 38  BILITOT 0.5 0.2* 0.4  PROT 3.3* 3.9* 3.2*  ALBUMIN <1.0* 1.3* 1.0*   No results for input(s): LIPASE, AMYLASE in the last 168 hours. No results for input(s): AMMONIA in the last 168 hours. Coagulation Profile: Recent Labs  Lab 01/29/20 0333  INR 1.1   Cardiac  Enzymes: No results for input(s): CKTOTAL, CKMB, CKMBINDEX, TROPONINI in the last 168 hours. BNP (last 3 results) No results for input(s): PROBNP in the last 8760 hours. HbA1C: No results for input(s): HGBA1C in the last 72 hours. CBG: No results for input(s): GLUCAP in the last 168 hours. Lipid Profile: No results for input(s): CHOL, HDL, LDLCALC, TRIG, CHOLHDL, LDLDIRECT in the last 72 hours. Thyroid Function Tests: Recent Labs    01/28/20 0514  TSH 3.916   Anemia Panel: Recent Labs    01/29/20 0333  VITAMINB12 159*  FOLATE 5.5*  FERRITIN 434*  TIBC NOT CALCULATED  IRON 53  RETICCTPCT 0.8   Sepsis Labs: No results for input(s): PROCALCITON, LATICACIDVEN in the last 168 hours.  Recent Results (from the past 240 hour(s))  SARS Coronavirus 2 by RT PCR (hospital order, performed in [facility] hospital lab) Nasopharyngeal Nasopharyngeal Swab     Status: None   Collection Time: 01/28/20  4:49 AM   Specimen: Nasopharyngeal Swab  Result Value Ref Range Status   SARS Coronavirus 2 NEGATIVE NEGATIVE Final    Comment: (NOTE) SARS-CoV-2 target nucleic acids are NOT DETECTED.  The SARS-CoV-2 RNA is generally detectable in upper and lower respiratory specimens during the acute phase of infection. The lowest concentration of SARS-CoV-2 viral copies this assay can detect is 250 copies / mL. A negative result does not preclude SARS-CoV-2 infection and should not be used as the sole basis for treatment or other patient management decisions.  A negative result may occur with improper specimen collection / handling, submission of specimen other than nasopharyngeal swab, presence of viral mutation(s) within the areas targeted by this assay, and inadequate number of viral copies (<250 copies / mL). A negative result must be combined with clinical observations, patient history, and epidemiological information.  Fact Sheet for Patients:    StrictlyIdeas.no  Fact Sheet for Healthcare Providers: BankingDealers.co.za  This test is not yet approved or  cleared by the [place] FDA and has been authorized for detection and/or diagnosis of SARS-CoV-2 by FDA under an Emergency Use Authorization (EUA).  This EUA will remain in effect (meaning this test can be used) for the duration of the COVID-19 declaration under Section 564(b)(1) of the Act, 21 U.S.C. section 360bbb-3(b)(1), unless the authorization is terminated or revoked sooner.  Performed at  [facility], [place] [address], [place], [place] [address]          Radiology Studies: NM Pulmonary Perf and Vent  Result Date: 01/28/2020 CLINICAL DATA:  Nephrotic syndrome, anasarca, elevated D-dimer, lower extremity edema, right-sided chest pain EXAM: NUCLEAR MEDICINE PERFUSION LUNG SCAN TECHNIQUE: Perfusion images were obtained in multiple projections after intravenous injection of radiopharmaceutical. Ventilation scans intentionally deferred if perfusion scan and chest x-ray adequate for interpretation during COVID 19 epidemic. RADIOPHARMACEUTICALS:  3.8 mCi Tc-33m MAA IV COMPARISON:  01/27/2020 FINDINGS: Planar images of the chest are obtained in multiple projections during the perfusion phase of the examination. There are no perfusion defects identified. No evidence of pulmonary embolus. Photopenia at the costophrenic angles compatible with a pleural effusion seen on chest x-ray. IMPRESSION: 1. No evidence of pulmonary embolism, very low probability perfusion scan. Electronically Signed   By: Randa Ngo M.D.   On: 01/28/2020 15:52   ECHOCARDIOGRAM COMPLETE  Result Date: 01/29/2020    ECHOCARDIOGRAM REPORT   Patient Name:   Philip Trevino Date of Exam: 01/29/2020 Medical Rec #:  [id number]     Height:       70.0 in Accession #:    [id number]    Weight:       199.3 lb Date of Birth:  1995-10-08      BSA:          2.084 m  Patient Age:    24 years      BP:           127/77 mmHg Patient Gender: M             HR:           70 bpm. Exam Location:  Inpatient Procedure: 2D Echo, Cardiac Doppler and Color Doppler Indications:    Anasarca associated with disorder of kidney  History:        Patient has no prior history of Echocardiogram examinations.                 Risk Factors:Hypertension. Anasarca. Acute renal failure.  Sonographer:    [name] Lefort RDCS (AE) Referring Phys: [id number] [place]  1. Normal GLS -17. Left ventricular ejection fraction, by estimation, is 60 to 65%. The left ventricle has normal function. The left ventricle has no regional wall motion abnormalities. Left ventricular diastolic parameters were normal.  2. Right ventricular systolic function is normal. The right ventricular size is normal. There is normal pulmonary artery systolic pressure.  3. The mitral valve is normal in structure. Trivial mitral valve regurgitation. No evidence of mitral stenosis.  4. The aortic valve is tricuspid. Aortic valve regurgitation is not visualized. No aortic stenosis is present.  5. The inferior vena cava is normal in size with greater than 50% respiratory variability, suggesting right atrial pressure of 3 mmHg. FINDINGS  Left Ventricle: Normal GLS -17. Left ventricular ejection fraction, by estimation, is 60 to 65%. The left ventricle has normal function. The left ventricle has no regional wall motion abnormalities. The left ventricular internal cavity size was normal in size. There is no left ventricular hypertrophy. Left ventricular diastolic parameters were normal. Right Ventricle: The right ventricular size is normal. No increase in right ventricular wall thickness. Right ventricular systolic function is normal. There is normal pulmonary artery systolic pressure. The tricuspid regurgitant velocity is 1.59 m/s, and  with an assumed right atrial pressure of 10 mmHg, the estimated right ventricular systolic pressure  is 10.2 mmHg.  Left Atrium: Left atrial size was normal in size. Right Atrium: Right atrial size was normal in size. Pericardium: There is no evidence of pericardial effusion. Mitral Valve: The mitral valve is normal in structure. There is mild thickening of the mitral valve leaflet(s). Normal mobility of the mitral valve leaflets. Trivial mitral valve regurgitation. No evidence of mitral valve stenosis. MV peak gradient, 3.7 mmHg. The mean mitral valve gradient is 1.0 mmHg. Tricuspid Valve: The tricuspid valve is normal in structure. Tricuspid valve regurgitation is mild . No evidence of tricuspid stenosis. Aortic Valve: The aortic valve is tricuspid. Aortic valve regurgitation is not visualized. No aortic stenosis is present. Aortic valve mean gradient measures 4.0 mmHg. Aortic valve peak gradient measures 8.4 mmHg. Aortic valve area, by VTI measures 2.48 cm. Pulmonic Valve: The pulmonic valve was normal in structure. Pulmonic valve regurgitation is not visualized. No evidence of pulmonic stenosis. Aorta: The aortic root is normal in size and structure. Venous: The inferior vena cava is normal in size with greater than 50% respiratory variability, suggesting right atrial pressure of 3 mmHg. IAS/Shunts: No atrial level shunt detected by color flow Doppler.  LEFT VENTRICLE PLAX 2D LVIDd:         5.10 cm  Diastology LVIDs:         3.10 cm  LV e' lateral:   15.20 cm/s LV PW:         1.30 cm  LV E/e' lateral: 4.8 LV IVS:        1.10 cm  LV e' medial:    10.70 cm/s LVOT diam:     2.10 cm  LV E/e' medial:  6.9 LV SV:         70 LV SV Index:   34 LVOT Area:     3.46 cm                          3D Volume EF:                         3D EF:        64 %                         LV EDV:       147 ml                         LV ESV:       53 ml                         LV SV:        94 ml RIGHT VENTRICLE             IVC RV Basal diam:  3.10 cm     IVC diam: 1.50 cm RV S prime:     15.40 cm/s TAPSE (M-mode): 2.0 cm LEFT ATRIUM              Index       RIGHT ATRIUM           Index LA diam:        3.20 cm 1.54 cm/m  RA Area:     16.10 cm LA Vol (A2C):   72.1 ml 34.59 ml/m RA Volume:   41.50 ml  19.91 ml/m LA Vol (A4C):   50.4 ml 24.18  ml/m LA Biplane Vol: 64.1 ml 30.75 ml/m  AORTIC VALVE AV Area (Vmax):    2.72 cm AV Area (Vmean):   2.53 cm AV Area (VTI):     2.48 cm AV Vmax:           145.00 cm/s AV Vmean:          98.100 cm/s AV VTI:            0.284 m AV Peak Grad:      8.4 mmHg AV Mean Grad:      4.0 mmHg LVOT Vmax:         114.00 cm/s LVOT Vmean:        71.600 cm/s LVOT VTI:          0.203 m LVOT/AV VTI ratio: 0.71  AORTA Ao Root diam: 3.50 cm Ao Asc diam:  2.80 cm MITRAL VALVE               TRICUSPID VALVE MV Area (PHT): 3.91 cm    TR Peak grad:   10.1 mmHg MV Peak grad:  3.7 mmHg    TR Vmax:        159.00 cm/s MV Mean grad:  1.0 mmHg MV Vmax:       0.97 m/s    SHUNTS MV Vmean:      48.7 cm/s   Systemic VTI:  0.20 m MV Decel Time: 194 msec    Systemic Diam: 2.10 cm MV E velocity: 73.50 cm/s MV A velocity: 36.40 cm/s MV E/A ratio:  2.02 Jenkins Rouge MD Electronically signed by Jenkins Rouge MD Signature Date/Time: 01/29/2020/3:19:48 PM    Final    VAS [place] LOWER EXTREMITY VENOUS (DVT)  Result Date: 01/29/2020  Lower Venous DVTStudy Indications: Anasarca.  Risk Factors: Acute kidney injury. Limitations: Significant pitting edema and pain, Comparison Study: No prior study on file for comparison Performing Technologist: Sharion Dove RVS  Examination Guidelines: A complete evaluation includes B-mode imaging, spectral Doppler, color Doppler, and power Doppler as needed of all accessible portions of each vessel. Bilateral testing is considered an integral part of a complete examination. Limited examinations for reoccurring indications may be performed as noted. The reflux portion of the exam is performed with the patient in reverse Trendelenburg.  +---------+---------------+---------+-----------+----------+-------------------+ RIGHT     CompressibilityPhasicitySpontaneityPropertiesThrombus Aging      +---------+---------------+---------+-----------+----------+-------------------+ CFV                     Yes      Yes                  patent by color and                                                       Doppler             +---------+---------------+---------+-----------+----------+-------------------+ FV Prox  Full                                                             +---------+---------------+---------+-----------+----------+-------------------+ FV Mid   Full                                                             +---------+---------------+---------+-----------+----------+-------------------+  FV Distal                                             patent by color and                                                       Doppler             +---------+---------------+---------+-----------+----------+-------------------+ POP      Full           Yes      Yes                                      +---------+---------------+---------+-----------+----------+-------------------+ PTV                                                   patent by color     +---------+---------------+---------+-----------+----------+-------------------+ PERO                                                  patent by color and                                                       Doppler             +---------+---------------+---------+-----------+----------+-------------------+   +---------+---------------+---------+-----------+----------+-------------------+ LEFT     CompressibilityPhasicitySpontaneityPropertiesThrombus Aging      +---------+---------------+---------+-----------+----------+-------------------+ CFV      Full           Yes      Yes                                      +---------+---------------+---------+-----------+----------+-------------------+ FV Prox   Full           Yes      Yes                                      +---------+---------------+---------+-----------+----------+-------------------+ FV Mid   Full                                                             +---------+---------------+---------+-----------+----------+-------------------+ FV Distal  patent by color and                                                       Doppler             +---------+---------------+---------+-----------+----------+-------------------+ PFV                                                   patent by color     +---------+---------------+---------+-----------+----------+-------------------+ POP      Full           Yes      Yes                                      +---------+---------------+---------+-----------+----------+-------------------+ PTV      Full                                                             +---------+---------------+---------+-----------+----------+-------------------+ PERO     Full                                                             +---------+---------------+---------+-----------+----------+-------------------+     Summary: RIGHT: - There is no evidence of deep vein thrombosis in the lower extremity. However, portions of this examination were limited- see technologist comments above.  interstitial fluid noted throughout  LEFT: - There is no evidence of deep vein thrombosis in the lower extremity. However, portions of this examination were limited- see technologist comments above.  Interstitial fluid noted throughout.  *See table(s) above for measurements and observations. Electronically signed by Harold Barban MD on 01/29/2020 at 6:01:58 PM.    Final         Scheduled Meds: . heparin  5,000 Units Subcutaneous Q8H  . pantoprazole  40 mg Oral Daily  . predniSONE  60 mg Oral Q breakfast   Continuous Infusions: . albumin human    .  cefTRIAXone (ROCEPHIN)  IV 2 g (01/29/20 1201)  . furosemide Stopped (01/29/20 2336)     LOS: 2 days    Time spent:40 min    Betty Daidone, Geraldo Docker, MD [facility] Pager [phone]  If 7PM-7AM, please contact night-coverage [email or web address] Password [facility] 01/30/2020, 9:29 AM

## 2020-01-30 NOTE — Progress Notes (Signed)
Patient ID: Philip Trevino, male   DOB: October 14, 1995, 24 y.o.   MRN: [id number] S: Feeling a little better.  Increased UOP with increased dose of lasix but still with significant edema O:BP (!) 136/92 (BP Location: Right Arm)   Pulse 60   Temp 98 F (36.7 C) (Oral)   Resp 17   Ht 5\' 10"  (1.778 m)   Wt 90.4 kg   SpO2 98%   BMI 28.60 kg/m   Intake/Output Summary (Last 24 hours) at 01/30/2020 0852 Last data filed at 01/30/2020 3220 Gross per 24 hour  Intake 1381.96 ml  Output 1350 ml  Net 31.96 ml   Intake/Output: I/O last 3 completed shifts: In: 2029 [P.O.:1785; IV Piggyback:244] Out: 2542 [Urine:1730]  Intake/Output this shift:  Total I/O In: 120 [P.O.:120] Out: 150 [Urine:150] Weight change:  Gen: NAD CVS: no rub Resp: cta Abd: +BS, soft, NT/ND Ext: 2+ anasarca  Recent Labs  Lab 01/27/20 2028 01/28/20 0513 01/29/20 0333 01/30/20 0218  NA 140  --  140 138  K 4.8  --  5.2* 4.6  CL 109  --  109 108  CO2 27  --  25 23  GLUCOSE 103*  --  140* 137*  BUN 45*  --  43* 47*  CREATININE 2.34* 2.25* 2.38* 2.42*  ALBUMIN <1.0*  --  1.3* 1.0*  CALCIUM 7.2*  --  7.7* 7.7*  PHOS  --   --  4.6 4.9*  AST 16  --  13* 13*  ALT 9  --  7 7   Liver Function Tests: Recent Labs  Lab 01/27/20 2028 01/29/20 0333 01/30/20 0218  AST 16 13* 13*  ALT 9 7 7   ALKPHOS 53 43 38  BILITOT 0.5 0.2* 0.4  PROT 3.3* 3.9* 3.2*  ALBUMIN <1.0* 1.3* 1.0*   No results for input(s): LIPASE, AMYLASE in the last 168 hours. No results for input(s): AMMONIA in the last 168 hours. CBC: Recent Labs  Lab 01/27/20 2028 01/27/20 2028 01/28/20 0513 01/29/20 0333 01/30/20 0218  WBC 7.4   < > 7.8 6.0 17.9*  NEUTROABS 3.9  --   --  5.2 14.4*  HGB 11.8*   < > 11.5* 11.1* 10.8*  HCT 34.4*   < > 33.8* 31.6* 29.8*  MCV 85.8  --  86.0 84.3 82.8  PLT 373   < > 347 371 423*   < > = values in this interval not displayed.   Cardiac Enzymes: No results for input(s): CKTOTAL, CKMB, CKMBINDEX, TROPONINI in the  last 168 hours. CBG: No results for input(s): GLUCAP in the last 168 hours.  Iron Studies:  Recent Labs    01/29/20 0333  IRON 53  TIBC NOT CALCULATED  FERRITIN 434*   Studies/Results: NM Pulmonary Perf and Vent  Result Date: 01/28/2020 CLINICAL DATA:  Nephrotic syndrome, anasarca, elevated D-dimer, lower extremity edema, right-sided chest pain EXAM: NUCLEAR MEDICINE PERFUSION LUNG SCAN TECHNIQUE: Perfusion images were obtained in multiple projections after intravenous injection of radiopharmaceutical. Ventilation scans intentionally deferred if perfusion scan and chest x-ray adequate for interpretation during COVID 19 epidemic. RADIOPHARMACEUTICALS:  3.8 mCi Tc-49m MAA IV COMPARISON:  01/27/2020 FINDINGS: Planar images of the chest are obtained in multiple projections during the perfusion phase of the examination. There are no perfusion defects identified. No evidence of pulmonary embolus. Photopenia at the costophrenic angles compatible with a pleural effusion seen on chest x-ray. IMPRESSION: 1. No evidence of pulmonary embolism, very low probability perfusion scan. Electronically Signed   By:  Randa Ngo M.D.   On: 01/28/2020 15:52   ECHOCARDIOGRAM COMPLETE  Result Date: 01/29/2020    ECHOCARDIOGRAM REPORT   Patient Name:   Philip Trevino Date of Exam: 01/29/2020 Medical Rec #:  [id number]     Height:       70.0 in Accession #:    [id number]    Weight:       199.3 lb Date of Birth:  12-03-1995      BSA:          2.084 m Patient Age:    24 years      BP:           127/77 mmHg Patient Gender: M             HR:           70 bpm. Exam Location:  Inpatient Procedure: 2D Echo, Cardiac Doppler and Color Doppler Indications:    Anasarca associated with disorder of kidney  History:        Patient has no prior history of Echocardiogram examinations.                 Risk Factors:Hypertension. Anasarca. Acute renal failure.  Sonographer:    [name] Lefort RDCS (AE) Referring Phys: [id number] [place]  1. Normal GLS -17. Left ventricular ejection fraction, by estimation, is 60 to 65%. The left ventricle has normal function. The left ventricle has no regional wall motion abnormalities. Left ventricular diastolic parameters were normal.  2. Right ventricular systolic function is normal. The right ventricular size is normal. There is normal pulmonary artery systolic pressure.  3. The mitral valve is normal in structure. Trivial mitral valve regurgitation. No evidence of mitral stenosis.  4. The aortic valve is tricuspid. Aortic valve regurgitation is not visualized. No aortic stenosis is present.  5. The inferior vena cava is normal in size with greater than 50% respiratory variability, suggesting right atrial pressure of 3 mmHg. FINDINGS  Left Ventricle: Normal GLS -17. Left ventricular ejection fraction, by estimation, is 60 to 65%. The left ventricle has normal function. The left ventricle has no regional wall motion abnormalities. The left ventricular internal cavity size was normal in size. There is no left ventricular hypertrophy. Left ventricular diastolic parameters were normal. Right Ventricle: The right ventricular size is normal. No increase in right ventricular wall thickness. Right ventricular systolic function is normal. There is normal pulmonary artery systolic pressure. The tricuspid regurgitant velocity is 1.59 m/s, and  with an assumed right atrial pressure of 10 mmHg, the estimated right ventricular systolic pressure is 28.4 mmHg. Left Atrium: Left atrial size was normal in size. Right Atrium: Right atrial size was normal in size. Pericardium: There is no evidence of pericardial effusion. Mitral Valve: The mitral valve is normal in structure. There is mild thickening of the mitral valve leaflet(s). Normal mobility of the mitral valve leaflets. Trivial mitral valve regurgitation. No evidence of mitral valve stenosis. MV peak gradient, 3.7 mmHg. The mean mitral valve gradient is 1.0  mmHg. Tricuspid Valve: The tricuspid valve is normal in structure. Tricuspid valve regurgitation is mild . No evidence of tricuspid stenosis. Aortic Valve: The aortic valve is tricuspid. Aortic valve regurgitation is not visualized. No aortic stenosis is present. Aortic valve mean gradient measures 4.0 mmHg. Aortic valve peak gradient measures 8.4 mmHg. Aortic valve area, by VTI measures 2.48 cm. Pulmonic Valve: The pulmonic valve was normal in structure. Pulmonic valve regurgitation is not visualized. No evidence  of pulmonic stenosis. Aorta: The aortic root is normal in size and structure. Venous: The inferior vena cava is normal in size with greater than 50% respiratory variability, suggesting right atrial pressure of 3 mmHg. IAS/Shunts: No atrial level shunt detected by color flow Doppler.  LEFT VENTRICLE PLAX 2D LVIDd:         5.10 cm  Diastology LVIDs:         3.10 cm  LV e' lateral:   15.20 cm/s LV PW:         1.30 cm  LV E/e' lateral: 4.8 LV IVS:        1.10 cm  LV e' medial:    10.70 cm/s LVOT diam:     2.10 cm  LV E/e' medial:  6.9 LV SV:         70 LV SV Index:   34 LVOT Area:     3.46 cm                          3D Volume EF:                         3D EF:        64 %                         LV EDV:       147 ml                         LV ESV:       53 ml                         LV SV:        94 ml RIGHT VENTRICLE             IVC RV Basal diam:  3.10 cm     IVC diam: 1.50 cm RV S prime:     15.40 cm/s TAPSE (M-mode): 2.0 cm LEFT ATRIUM             Index       RIGHT ATRIUM           Index LA diam:        3.20 cm 1.54 cm/m  RA Area:     16.10 cm LA Vol (A2C):   72.1 ml 34.59 ml/m RA Volume:   41.50 ml  19.91 ml/m LA Vol (A4C):   50.4 ml 24.18 ml/m LA Biplane Vol: 64.1 ml 30.75 ml/m  AORTIC VALVE AV Area (Vmax):    2.72 cm AV Area (Vmean):   2.53 cm AV Area (VTI):     2.48 cm AV Vmax:           145.00 cm/s AV Vmean:          98.100 cm/s AV VTI:            0.284 m AV Peak Grad:      8.4 mmHg AV  Mean Grad:      4.0 mmHg LVOT Vmax:         114.00 cm/s LVOT Vmean:        71.600 cm/s LVOT VTI:          0.203 m LVOT/AV VTI ratio: 0.71  AORTA Ao Root diam: 3.50 cm Ao Asc diam:  2.80 cm MITRAL  VALVE               TRICUSPID VALVE MV Area (PHT): 3.91 cm    TR Peak grad:   10.1 mmHg MV Peak grad:  3.7 mmHg    TR Vmax:        159.00 cm/s MV Mean grad:  1.0 mmHg MV Vmax:       0.97 m/s    SHUNTS MV Vmean:      48.7 cm/s   Systemic VTI:  0.20 m MV Decel Time: 194 msec    Systemic Diam: 2.10 cm MV E velocity: 73.50 cm/s MV A velocity: 36.40 cm/s MV E/A ratio:  2.02 Jenkins Rouge MD Electronically signed by Jenkins Rouge MD Signature Date/Time: 01/29/2020/3:19:48 PM    Final    VAS [place] LOWER EXTREMITY VENOUS (DVT)  Result Date: 01/29/2020  Lower Venous DVTStudy Indications: Anasarca.  Risk Factors: Acute kidney injury. Limitations: Significant pitting edema and pain, Comparison Study: No prior study on file for comparison Performing Technologist: Sharion Dove RVS  Examination Guidelines: A complete evaluation includes B-mode imaging, spectral Doppler, color Doppler, and power Doppler as needed of all accessible portions of each vessel. Bilateral testing is considered an integral part of a complete examination. Limited examinations for reoccurring indications may be performed as noted. The reflux portion of the exam is performed with the patient in reverse Trendelenburg.  +---------+---------------+---------+-----------+----------+-------------------+ RIGHT    CompressibilityPhasicitySpontaneityPropertiesThrombus Aging      +---------+---------------+---------+-----------+----------+-------------------+ CFV                     Yes      Yes                  patent by color and                                                       Doppler             +---------+---------------+---------+-----------+----------+-------------------+ FV Prox  Full                                                              +---------+---------------+---------+-----------+----------+-------------------+ FV Mid   Full                                                             +---------+---------------+---------+-----------+----------+-------------------+ FV Distal                                             patent by color and  Doppler             +---------+---------------+---------+-----------+----------+-------------------+ POP      Full           Yes      Yes                                      +---------+---------------+---------+-----------+----------+-------------------+ PTV                                                   patent by color     +---------+---------------+---------+-----------+----------+-------------------+ PERO                                                  patent by color and                                                       Doppler             +---------+---------------+---------+-----------+----------+-------------------+   +---------+---------------+---------+-----------+----------+-------------------+ LEFT     CompressibilityPhasicitySpontaneityPropertiesThrombus Aging      +---------+---------------+---------+-----------+----------+-------------------+ CFV      Full           Yes      Yes                                      +---------+---------------+---------+-----------+----------+-------------------+ FV Prox  Full           Yes      Yes                                      +---------+---------------+---------+-----------+----------+-------------------+ FV Mid   Full                                                             +---------+---------------+---------+-----------+----------+-------------------+ FV Distal                                             patent by color and                                                       Doppler              +---------+---------------+---------+-----------+----------+-------------------+ PFV  patent by color     +---------+---------------+---------+-----------+----------+-------------------+ POP      Full           Yes      Yes                                      +---------+---------------+---------+-----------+----------+-------------------+ PTV      Full                                                             +---------+---------------+---------+-----------+----------+-------------------+ PERO     Full                                                             +---------+---------------+---------+-----------+----------+-------------------+     Summary: RIGHT: - There is no evidence of deep vein thrombosis in the lower extremity. However, portions of this examination were limited- see technologist comments above.  interstitial fluid noted throughout  LEFT: - There is no evidence of deep vein thrombosis in the lower extremity. However, portions of this examination were limited- see technologist comments above.  Interstitial fluid noted throughout.  *See table(s) above for measurements and observations. Electronically signed by Harold Barban MD on 01/29/2020 at 6:01:58 PM.    Final    . heparin  5,000 Units Subcutaneous Q8H  . pantoprazole  40 mg Oral Daily  . predniSONE  60 mg Oral Q breakfast    BMET    Component Value Date/Time   NA 138 01/30/2020 0218   K 4.6 01/30/2020 0218   CL 108 01/30/2020 0218   CO2 23 01/30/2020 0218   GLUCOSE 137 (H) 01/30/2020 0218   BUN 47 (H) 01/30/2020 0218   CREATININE 2.42 (H) 01/30/2020 0218   CALCIUM 7.7 (L) 01/30/2020 0218   GFRNONAA 36 (L) 01/30/2020 0218   GFRAA 42 (L) 01/30/2020 0218   CBC    Component Value Date/Time   WBC 17.9 (H) 01/30/2020 0218   RBC 3.60 (L) 01/30/2020 0218   HGB 10.8 (L) 01/30/2020 0218   HCT 29.8 (L) 01/30/2020 0218   PLT 423 (H) 01/30/2020  0218   MCV 82.8 01/30/2020 0218   MCH 30.0 01/30/2020 0218   MCHC 36.2 (H) 01/30/2020 0218   RDW 11.5 01/30/2020 0218   LYMPHSABS 2.3 01/30/2020 0218   MONOABS 1.1 (H) 01/30/2020 0218   EOSABS 0.0 01/30/2020 0218   BASOSABS 0.0 01/30/2020 0218   Assessment/Plan: 1. 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  1. Will add IV albumin prior to next lasix to 120 mg iv tid dose and follow UOP and Scr 2. May need to add metolazone if no significant increase in UOP. 3. Resumed prednisone 60 mg daily 01/28/20 4. Discussed need for repeat renal biopsy to ascertain etiology and possibly change medications accordingly. Plan for renal biopsy 01/31/20.   5. started PPI for GI prophylaxis 6. Hold  off on aspirin due to pending renal biopsy. 2. AKI- presumably due to ischemic ATN in setting of nephrotic syndrome and anasarca. Await renal biopsy on 01/31/20 3. Anemia - new since March. Will check iron stores and replete if needed. No indication for ESA 4. HTN- improving with IV diuresis.  Donetta Potts, MD [employer] [phone]

## 2020-01-30 NOTE — Progress Notes (Signed)
Order received for PICC line from Dr. Sherral Hammers. Nephrology is consulted and this author confirmed order. Unfortunately, Dr. Marval Regal did not want a PICC/Midline placed. Plans are for IR to place a tunneled line in future. In the meantime, an extended dwell is ordered to meet the patient's needs until central access is gained.

## 2020-01-31 ENCOUNTER — Inpatient Hospital Stay (HOSPITAL_COMMUNITY): Payer: Self-pay

## 2020-01-31 DIAGNOSIS — Z789 Other specified health status: Secondary | ICD-10-CM

## 2020-01-31 HISTORY — PX: IR US GUIDE VASC ACCESS RIGHT: IMG2390

## 2020-01-31 HISTORY — PX: IR FLUORO GUIDE CV LINE RIGHT: IMG2283

## 2020-01-31 LAB — ANTINUCLEAR ANTIBODIES, IFA: ANA Ab, IFA: NEGATIVE

## 2020-01-31 LAB — GLOMERULAR BASEMENT MEMBRANE ANTIBODIES: GBM Ab: 2 units (ref 0–20)

## 2020-01-31 MED ORDER — AMLODIPINE BESYLATE 5 MG PO TABS
5.0000 mg | ORAL_TABLET | Freq: Once | ORAL | Status: AC
  Administered 2020-01-31: 5 mg via ORAL
  Filled 2020-01-31: qty 1

## 2020-01-31 MED ORDER — MIDAZOLAM HCL 2 MG/2ML IJ SOLN
INTRAMUSCULAR | Status: AC | PRN
  Administered 2020-01-31 (×2): 1 mg via INTRAVENOUS

## 2020-01-31 MED ORDER — HYDRALAZINE HCL 20 MG/ML IJ SOLN
10.0000 mg | Freq: Four times a day (QID) | INTRAMUSCULAR | Status: DC
  Administered 2020-01-31 – 2020-02-02 (×7): 10 mg via INTRAVENOUS
  Filled 2020-01-31 (×7): qty 1

## 2020-01-31 MED ORDER — HEPARIN SODIUM (PORCINE) 5000 UNIT/ML IJ SOLN
5000.0000 [IU] | Freq: Three times a day (TID) | INTRAMUSCULAR | Status: DC
  Administered 2020-01-31 – 2020-02-02 (×5): 5000 [IU] via SUBCUTANEOUS
  Filled 2020-01-31 (×5): qty 1

## 2020-01-31 MED ORDER — FENTANYL CITRATE (PF) 100 MCG/2ML IJ SOLN
INTRAMUSCULAR | Status: AC
  Filled 2020-01-31: qty 2

## 2020-01-31 MED ORDER — LIDOCAINE HCL 1 % IJ SOLN
INTRAMUSCULAR | Status: AC
  Filled 2020-01-31: qty 40

## 2020-01-31 MED ORDER — GELATIN ABSORBABLE 12-7 MM EX MISC
CUTANEOUS | Status: AC
  Filled 2020-01-31: qty 1

## 2020-01-31 MED ORDER — HYDRALAZINE HCL 20 MG/ML IJ SOLN
INTRAMUSCULAR | Status: AC
  Filled 2020-01-31: qty 1

## 2020-01-31 MED ORDER — FENTANYL CITRATE (PF) 100 MCG/2ML IJ SOLN
INTRAMUSCULAR | Status: AC | PRN
  Administered 2020-01-31: 25 ug via INTRAVENOUS
  Administered 2020-01-31: 50 ug via INTRAVENOUS

## 2020-01-31 MED ORDER — MIDAZOLAM HCL 2 MG/2ML IJ SOLN
INTRAMUSCULAR | Status: AC
  Filled 2020-01-31: qty 2

## 2020-01-31 MED ORDER — AMLODIPINE BESYLATE 10 MG PO TABS
10.0000 mg | ORAL_TABLET | Freq: Every day | ORAL | Status: DC
  Administered 2020-02-01 – 2020-02-02 (×2): 10 mg via ORAL
  Filled 2020-01-31 (×2): qty 1

## 2020-01-31 MED ORDER — TRAMADOL HCL 50 MG PO TABS
50.0000 mg | ORAL_TABLET | Freq: Four times a day (QID) | ORAL | Status: DC | PRN
  Administered 2020-02-01 (×3): 50 mg via ORAL
  Filled 2020-01-31 (×3): qty 1

## 2020-01-31 MED ORDER — LIDOCAINE HCL (PF) 1 % IJ SOLN
INTRAMUSCULAR | Status: AC | PRN
  Administered 2020-01-31: 10 mL

## 2020-01-31 NOTE — Procedures (Signed)
Pre procedural Diagnosis: Poor venous access Post Procedural Diagnosis: Same  Successful placement of right IJ approach non-tunneled dual lumen PICC line with tip at the superior caval-atrial junction.    EBL: None  No immediate post procedural complication.  The CVC is ready for immediate use.  Ronny Bacon, MD Pager #: [phone]

## 2020-01-31 NOTE — Progress Notes (Signed)
Patient ID: Philip Trevino, male   DOB: August 06, 1995, 24 y.o.   MRN: [id number] S: s/p renal bx this AM.  I/O 1.4 / 4.0 yesterday. Feels abd less full.  No new complaints  O:BP (!) 139/98 (BP Location: Right Arm)    Pulse (!) 57    Temp 98.4 F (36.9 C) (Oral)    Resp 16    Ht 5\' 10"  (1.778 m)    Wt 89 kg    SpO2 100%    BMI 28.15 kg/m   Intake/Output Summary (Last 24 hours) at 01/31/2020 1104 Last data filed at 01/31/2020 1031 Gross per 24 hour  Intake 1136.37 ml  Output 3920 ml  Net -2783.63 ml   Intake/Output: I/O last 3 completed shifts: In: 2040.4 [P.O.:1500; IV Piggyback:540.4] Out: 4725 [Urine:4725]  Intake/Output this shift:  Total I/O In: 118 [P.O.:118] Out: 220 [Urine:220] Weight change:  Gen: NAD CVS: no rub Resp: cta Abd: +BS, soft, NT/ND Ext: 2+ dependent anasarca  Recent Labs  Lab 01/27/20 2028 01/28/20 0513 01/29/20 0333 01/30/20 0218  NA 140  --  140 138  K 4.8  --  5.2* 4.6  CL 109  --  109 108  CO2 27  --  25 23  GLUCOSE 103*  --  140* 137*  BUN 45*  --  43* 47*  CREATININE 2.34* 2.25* 2.38* 2.42*  ALBUMIN <1.0*  --  1.3* 1.0*  CALCIUM 7.2*  --  7.7* 7.7*  PHOS  --   --  4.6 4.9*  AST 16  --  13* 13*  ALT 9  --  7 7   Liver Function Tests: Recent Labs  Lab 01/27/20 2028 01/29/20 0333 01/30/20 0218  AST 16 13* 13*  ALT 9 7 7   ALKPHOS 53 43 38  BILITOT 0.5 0.2* 0.4  PROT 3.3* 3.9* 3.2*  ALBUMIN <1.0* 1.3* 1.0*   No results for input(s): LIPASE, AMYLASE in the last 168 hours. No results for input(s): AMMONIA in the last 168 hours. CBC: Recent Labs  Lab 01/27/20 2028 01/27/20 2028 01/28/20 0513 01/29/20 0333 01/30/20 0218  WBC 7.4   < > 7.8 6.0 17.9*  NEUTROABS 3.9  --   --  5.2 14.4*  HGB 11.8*   < > 11.5* 11.1* 10.8*  HCT 34.4*   < > 33.8* 31.6* 29.8*  MCV 85.8  --  86.0 84.3 82.8  PLT 373   < > 347 371 423*   < > = values in this interval not displayed.   Cardiac Enzymes: No results for input(s): CKTOTAL, CKMB, CKMBINDEX,  TROPONINI in the last 168 hours. CBG: No results for input(s): GLUCAP in the last 168 hours.  Iron Studies:  Recent Labs    01/29/20 0333  IRON 53  TIBC NOT CALCULATED  FERRITIN 434*   Studies/Results: ECHOCARDIOGRAM COMPLETE  Result Date: 01/29/2020    ECHOCARDIOGRAM REPORT   Patient Name:   Philip Trevino Date of Exam: 01/29/2020 Medical Rec #:  [id number]     Height:       70.0 in Accession #:    [id number]    Weight:       199.3 lb Date of Birth:  Aug 27, 1995      BSA:          2.084 m Patient Age:    24 years      BP:           127/77 mmHg Patient Gender: M  HR:           70 bpm. Exam Location:  Inpatient Procedure: 2D Echo, Cardiac Doppler and Color Doppler Indications:    Anasarca associated with disorder of kidney  History:        Patient has no prior history of Echocardiogram examinations.                 Risk Factors:Hypertension. Anasarca. Acute renal failure.  Sonographer:    [name] Lefort RDCS (AE) Referring Phys: [id number] [place]  1. Normal GLS -17. Left ventricular ejection fraction, by estimation, is 60 to 65%. The left ventricle has normal function. The left ventricle has no regional wall motion abnormalities. Left ventricular diastolic parameters were normal.  2. Right ventricular systolic function is normal. The right ventricular size is normal. There is normal pulmonary artery systolic pressure.  3. The mitral valve is normal in structure. Trivial mitral valve regurgitation. No evidence of mitral stenosis.  4. The aortic valve is tricuspid. Aortic valve regurgitation is not visualized. No aortic stenosis is present.  5. The inferior vena cava is normal in size with greater than 50% respiratory variability, suggesting right atrial pressure of 3 mmHg. FINDINGS  Left Ventricle: Normal GLS -17. Left ventricular ejection fraction, by estimation, is 60 to 65%. The left ventricle has normal function. The left ventricle has no regional wall motion abnormalities. The  left ventricular internal cavity size was normal in size. There is no left ventricular hypertrophy. Left ventricular diastolic parameters were normal. Right Ventricle: The right ventricular size is normal. No increase in right ventricular wall thickness. Right ventricular systolic function is normal. There is normal pulmonary artery systolic pressure. The tricuspid regurgitant velocity is 1.59 m/s, and  with an assumed right atrial pressure of 10 mmHg, the estimated right ventricular systolic pressure is 36.6 mmHg. Left Atrium: Left atrial size was normal in size. Right Atrium: Right atrial size was normal in size. Pericardium: There is no evidence of pericardial effusion. Mitral Valve: The mitral valve is normal in structure. There is mild thickening of the mitral valve leaflet(s). Normal mobility of the mitral valve leaflets. Trivial mitral valve regurgitation. No evidence of mitral valve stenosis. MV peak gradient, 3.7 mmHg. The mean mitral valve gradient is 1.0 mmHg. Tricuspid Valve: The tricuspid valve is normal in structure. Tricuspid valve regurgitation is mild . No evidence of tricuspid stenosis. Aortic Valve: The aortic valve is tricuspid. Aortic valve regurgitation is not visualized. No aortic stenosis is present. Aortic valve mean gradient measures 4.0 mmHg. Aortic valve peak gradient measures 8.4 mmHg. Aortic valve area, by VTI measures 2.48 cm. Pulmonic Valve: The pulmonic valve was normal in structure. Pulmonic valve regurgitation is not visualized. No evidence of pulmonic stenosis. Aorta: The aortic root is normal in size and structure. Venous: The inferior vena cava is normal in size with greater than 50% respiratory variability, suggesting right atrial pressure of 3 mmHg. IAS/Shunts: No atrial level shunt detected by color flow Doppler.  LEFT VENTRICLE PLAX 2D LVIDd:         5.10 cm  Diastology LVIDs:         3.10 cm  LV e' lateral:   15.20 cm/s LV PW:         1.30 cm  LV E/e' lateral: 4.8 LV IVS:         1.10 cm  LV e' medial:    10.70 cm/s LVOT diam:     2.10 cm  LV E/e' medial:  6.9 LV SV:         70 LV SV Index:   34 LVOT Area:     3.46 cm                          3D Volume EF:                         3D EF:        64 %                         LV EDV:       147 ml                         LV ESV:       53 ml                         LV SV:        94 ml RIGHT VENTRICLE             IVC RV Basal diam:  3.10 cm     IVC diam: 1.50 cm RV S prime:     15.40 cm/s TAPSE (M-mode): 2.0 cm LEFT ATRIUM             Index       RIGHT ATRIUM           Index LA diam:        3.20 cm 1.54 cm/m  RA Area:     16.10 cm LA Vol (A2C):   72.1 ml 34.59 ml/m RA Volume:   41.50 ml  19.91 ml/m LA Vol (A4C):   50.4 ml 24.18 ml/m LA Biplane Vol: 64.1 ml 30.75 ml/m  AORTIC VALVE AV Area (Vmax):    2.72 cm AV Area (Vmean):   2.53 cm AV Area (VTI):     2.48 cm AV Vmax:           145.00 cm/s AV Vmean:          98.100 cm/s AV VTI:            0.284 m AV Peak Grad:      8.4 mmHg AV Mean Grad:      4.0 mmHg LVOT Vmax:         114.00 cm/s LVOT Vmean:        71.600 cm/s LVOT VTI:          0.203 m LVOT/AV VTI ratio: 0.71  AORTA Ao Root diam: 3.50 cm Ao Asc diam:  2.80 cm MITRAL VALVE               TRICUSPID VALVE MV Area (PHT): 3.91 cm    TR Peak grad:   10.1 mmHg MV Peak grad:  3.7 mmHg    TR Vmax:        159.00 cm/s MV Mean grad:  1.0 mmHg MV Vmax:       0.97 m/s    SHUNTS MV Vmean:      48.7 cm/s   Systemic VTI:  0.20 m MV Decel Time: 194 msec    Systemic Diam: 2.10 cm MV E velocity: 73.50 cm/s MV A velocity: 36.40 cm/s MV E/A ratio:  2.02 Jenkins Rouge MD Electronically signed by Jenkins Rouge MD Signature Date/Time: 01/29/2020/3:19:48 PM    Final  VAS [place] LOWER EXTREMITY VENOUS (DVT)  Result Date: 01/29/2020  Lower Venous DVTStudy Indications: Anasarca.  Risk Factors: Acute kidney injury. Limitations: Significant pitting edema and pain, Comparison Study: No prior study on file for comparison Performing Technologist: Sharion Dove  RVS  Examination Guidelines: A complete evaluation includes B-mode imaging, spectral Doppler, color Doppler, and power Doppler as needed of all accessible portions of each vessel. Bilateral testing is considered an integral part of a complete examination. Limited examinations for reoccurring indications may be performed as noted. The reflux portion of the exam is performed with the patient in reverse Trendelenburg.  +---------+---------------+---------+-----------+----------+-------------------+  RIGHT     Compressibility Phasicity Spontaneity Properties Thrombus Aging       +---------+---------------+---------+-----------+----------+-------------------+  CFV                       Yes       Yes                    patent by color and                                                              Doppler              +---------+---------------+---------+-----------+----------+-------------------+  FV Prox   Full                                                                  +---------+---------------+---------+-----------+----------+-------------------+  FV Mid    Full                                                                  +---------+---------------+---------+-----------+----------+-------------------+  FV Distal                                                  patent by color and                                                              Doppler              +---------+---------------+---------+-----------+----------+-------------------+  POP       Full            Yes       Yes                                         +---------+---------------+---------+-----------+----------+-------------------+  PTV                                                        patent by color      +---------+---------------+---------+-----------+----------+-------------------+  PERO                                                       patent by color and                                                              Doppler               +---------+---------------+---------+-----------+----------+-------------------+   +---------+---------------+---------+-----------+----------+-------------------+  LEFT      Compressibility Phasicity Spontaneity Properties Thrombus Aging       +---------+---------------+---------+-----------+----------+-------------------+  CFV       Full            Yes       Yes                                         +---------+---------------+---------+-----------+----------+-------------------+  FV Prox   Full            Yes       Yes                                         +---------+---------------+---------+-----------+----------+-------------------+  FV Mid    Full                                                                  +---------+---------------+---------+-----------+----------+-------------------+  FV Distal                                                  patent by color and                                                              Doppler              +---------+---------------+---------+-----------+----------+-------------------+  PFV  patent by color      +---------+---------------+---------+-----------+----------+-------------------+  POP       Full            Yes       Yes                                         +---------+---------------+---------+-----------+----------+-------------------+  PTV       Full                                                                  +---------+---------------+---------+-----------+----------+-------------------+  PERO      Full                                                                  +---------+---------------+---------+-----------+----------+-------------------+     Summary: RIGHT: - There is no evidence of deep vein thrombosis in the lower extremity. However, portions of this examination were limited- see technologist comments above.  interstitial fluid noted throughout  LEFT: - There is no  evidence of deep vein thrombosis in the lower extremity. However, portions of this examination were limited- see technologist comments above.  Interstitial fluid noted throughout.  *See table(s) above for measurements and observations. Electronically signed by Harold Barban MD on 01/29/2020 at 6:01:58 PM.    Final    [place] EKG SITE RITE  Result Date: 01/30/2020 If Site Rite image not attached, placement could not be confirmed due to current cardiac rhythm.   amLODipine  5 mg Oral Daily   diphenhydrAMINE  12.5 mg Intravenous Once   heparin  5,000 Units Subcutaneous Q8H   lidocaine       pantoprazole  40 mg Oral Daily   predniSONE  60 mg Oral Q breakfast    BMET    Component Value Date/Time   NA 138 01/30/2020 0218   K 4.6 01/30/2020 0218   CL 108 01/30/2020 0218   CO2 23 01/30/2020 0218   GLUCOSE 137 (H) 01/30/2020 0218   BUN 47 (H) 01/30/2020 0218   CREATININE 2.42 (H) 01/30/2020 0218   CALCIUM 7.7 (L) 01/30/2020 0218   GFRNONAA 36 (L) 01/30/2020 0218   GFRAA 42 (L) 01/30/2020 0218   CBC    Component Value Date/Time   WBC 17.9 (H) 01/30/2020 0218   RBC 3.60 (L) 01/30/2020 0218   HGB 10.8 (L) 01/30/2020 0218   HCT 29.8 (L) 01/30/2020 0218   PLT 423 (H) 01/30/2020 0218   MCV 82.8 01/30/2020 0218   MCH 30.0 01/30/2020 0218   MCHC 36.2 (H) 01/30/2020 0218   RDW 11.5 01/30/2020 0218   LYMPHSABS 2.3 01/30/2020 0218   MONOABS 1.1 (H) 01/30/2020 0218   EOSABS 0.0 01/30/2020 0218   BASOSABS 0.0 01/30/2020 0218   Assessment/Plan: 1. 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  1. Diuresed better wtih IV albumin prior to lasix yesterday, dose now at 120 mg iv tid .  If not diuresing today will add albumin to PM dose of lasix.  2. Hold on adding metolazone given robust diuresis yesterday 3. Resumed  prednisone 60 mg daily 01/28/20 4. S/p repeat renal biopsy 01/31/20  5.  PPI for GI prophylaxis with steroids 6. Hold off on aspirin due to pending renal biopsy.  Given severe hypoalbuminemia + nephrotic syndrome will resume DVT prophylaxis 3d post biopsy.  Encouraged pt to ambulate. 2. AKI- presumably due to ischemic ATN in setting of nephrotic syndrome and anasarca. Await renal biopsy results 3. Anemia - new since March. Iron level ok and not microcytic. No indication for ESA. CTM 4. HTN- improving with IV diuresis. 5. Dispo - needs to stay inpatient to await prelim renal biopsy results and for IV diuresis.  His daughter turns 1 on Saturday and he wishes to be present; I think this is very reasonable.   Jannifer Hick MD [facility] Pager [phone]

## 2020-01-31 NOTE — Progress Notes (Signed)
MEDICATION-RELATED CONSULT NOTE   IR Procedure Consult - Anticoagulant/Antiplatelet PTA/Inpatient Med List Review by Pharmacist    Procedure: renal biopsy    Completed: 7/12 ~10am  Post-Procedural bleeding risk per IR MD assessment:  high  Antithrombotic medications on inpatient or PTA profile prior to procedure:   Heparin 5000 units sq q8h (last dose 7/11 ~10pm)    Recommended restart time per IR Post-Procedure Guidelines:    at least 8 hours post-procedure  Plan:      Resume Heparin 5000 units sq q8hrs at ~10pm tonight.  Arty Baumgartner, [place] Phone: [phone] 01/31/2020 11:04 AM

## 2020-01-31 NOTE — Progress Notes (Signed)
PROGRESS NOTE    Philip Trevino  BUL:[id number] DOB: September 11, 1995 DOA: 01/27/2020 PCP: Clent Demark, PA-C     Brief Narrative:  Philip Trevino is a 24 y.o. BM PMHx  nephrotic syndrome used to be on Lasix and prednisone which patient states has not been taking for 2 to 3 months because he was not able to afford and he was homeless.  Presents to the ER because of increasing peripheral edema with right-sided chest pain starting happening from yesterday.  Pain is pleuritic in nature denies any fever chills.  Denies any nausea vomiting or diarrhea.  ED Course: In the ER patient was afebrile and on exam patient has anasarca with significant fluid in the abdomen lower extremity and facial puffiness.  Labs are remarkable for creatinine increasing from 0.95 and it is around 2.34 hemoglobin 11.8.  Chest x-ray shows bilateral infiltrates abdomen is not recordable.  UA is consistent with proteinuria.  High sensitive troponin was 5.5.  Covid test was negative.  EKG shows normal sinus rhythm.  ER physician discussed with on-call nephrologist Dr. Posey Pronto who advised giving Lasix and admitting for further management.    Subjective: 7/12 afebrile overnight A/O x4, negative CP, negative S OB.  States pain left lateral aspect of abdomen where they obtained kidney biopsy.    Assessment & Plan: Covid vaccination; negative vaccination   Principal Problem:   Anasarca Active Problems:   ARF (acute renal failure) (HCC)   Normochromic normocytic anemia   Elevated blood pressure reading   Minimal change disease   Anemia, unspecified   Benign essential HTN  Anasarca -See acute renal failure -Echocardiogram echocardiogram see results below  Acute Kidney Injury/Hx Minimal-Change Disease -Patient had been on Lasix and prednisone.  Discussed case with pharmacy and they were unable to see any steroids on patient's inpatient or outpatient MAR. -7/8 albumin 50 g will administer prior to next dose of Lasix;  patient's current albumin<1.0 -Discussed case with Dr Marval Regal Nephrology and he has agreed to evaluate patient. -7/9 per nephrology restart prednisone 60 mg daily -7/10 Zaroxolyn 10 mg x 3 dose -7/11 albumin 25 g per nephrology, however patient's albumin 1.0 we will add an additional albumin 25 g, -7/11 per nephrology increase Lasix 120 mg TID -Strict in and out -2.4 L  -Daily weight Filed Weights   01/29/20 0500 01/30/20 1228 01/31/20 0600  Weight: 90.4 kg 91.5 kg 89 kg  -Nephrology has ordered biopsy for 7/12 -Prednisone 60 mg daily Recent Labs  Lab 01/27/20 2028 01/28/20 0513 01/29/20 0333 01/30/20 0218  CREATININE 2.34* 2.25* 2.38* 2.42*  -7/10 if patient does not start to make significant urine will place patient on Lasix drip  UTI -Urine culture negative -7/10 start empiric antibiotic coverage  Hematuria   Normocytic anemia -Occult blood pending Recent Labs  Lab 01/27/20 2028 01/28/20 0513 01/29/20 0333 01/30/20 0218  HGB 11.8* 11.5* 11.1* 10.8*  -Anemia panel not consistent with iron deficiency anemia or macrocytic anemia, therefore by process of elimination will classify it as normocytic anemia  RIGHT pleuritic chest pain/Elevated D-dimer -Multifactorial to include fluid overload, elevated BP.  PE? -Treat underlying problem -D-dimer= 9.48 -VQ scan negative for PE, -7/10 Bilateral lower extremity Doppler; negative for DVT  Essential HTN -7/12 increase Amlodipine 10 mg daily  -7/12 Hydralazine 10 mg QID -See AKI  Leukocytosis -Reactive vs infection vs steroids. -Patient afebrile, no bands, negative left shift argues toward reactive anemia at this point  Pruritus around IV site -Benadryl 12.5 mg x 1 -Consult  IV team for power glide placement or PICC line   DVT prophylaxis: Subcu heparin Code Status: Full Family Communication:  Status is: Inpatient    Dispo: The patient is from: Home              Anticipated d/c is to: Home               Anticipated d/c date is: 7/16              Patient currently       Consultants:  Dr. Donato Heinz nephrology Dr. Sandi Mariscal, IR   Procedures/Significant Events:  7/8 CXR;Small to moderate bilateral pleural effusions with compressive atelectasis. 7/9 VQ scan; low probability PE 7/10 Echocardiogram LVEF= 60 to 65%. 7/12 US guided biopsy of inferior pole of the LEFT kidney. 7/12 placement of right IJ approach non-tunneled dual lumen PICC line with tip at the superior caval-atrial junction      I have personally reviewed and interpreted all radiology studies and my findings are as above.  VENTILATOR SETTINGS:    Cultures 7/9 SARS coronavirus negative 7/10 urine; negative 7/11 blood pending  Antimicrobials: Anti-infectives (From admission, onward)   Start     Ordered Stop   01/29/20 1130  cefTRIAXone (ROCEPHIN) 2 g in sodium chloride 0.9 % 100 mL IVPB     Discontinue     01/29/20 1036         Devices    LINES / TUBES:  placement of right IJ approach non-tunneled dual lumen PICC line with tip at the superior caval-atrial junction 7/12>>>    Continuous Infusions: . cefTRIAXone (ROCEPHIN)  IV 2 g (01/30/20 1251)  . furosemide 120 mg (01/31/20 1025)     Objective: Vitals:   01/31/20 0925 01/31/20 0930 01/31/20 0935 01/31/20 0951  BP: (!) 134/99 (!) 141/103 (!) 137/100 (!) 139/98  Pulse: (!) 54 (!) 55 66 (!) 57  Resp: 19 13 13 16   Temp:    98.4 F (36.9 C)  TempSrc:    Oral  SpO2: 100% 100% 100% 100%  Weight:      Height:        Intake/Output Summary (Last 24 hours) at 01/31/2020 1118 Last data filed at 01/31/2020 1031 Gross per 24 hour  Intake 1136.37 ml  Output 3920 ml  Net -2783.63 ml   Filed Weights   01/29/20 0500 01/30/20 1228 01/31/20 0600  Weight: 90.4 kg 91.5 kg 89 kg   Physical Exam:  General: A/O x4, No acute respiratory distress Eyes: negative scleral hemorrhage, negative anisocoria, negative icterus ENT: Negative Runny nose,  negative gingival bleeding, Neck:  Negative scars, masses, torticollis, lymphadenopathy, JVD Lungs: Clear to auscultation bilaterally without wheezes or crackles Cardiovascular: Regular rate and rhythm without murmur gallop or rub normal S1 and S2 Abdomen: negative abdominal pain, positive distention, positive soft, bowel sounds, no rebound, positive ascites, no appreciable mass Extremities: positive anasarca Skin: Negative rashes, lesions, ulcers Psychiatric:  Negative depression, negative anxiety, negative fatigue, negative mania  Central nervous system:  Cranial nerves II through XII intact, tongue/uvula midline, all extremities muscle strength 5/5, sensation intact throughout, negative dysarthria, negative expressive aphasia, negative receptive aphasia.  .     Data Reviewed: Care during the described time interval was provided by me .  I have reviewed this patient's available data, including medical history, events of note, physical examination, and all test results as part of my evaluation.  CBC: Recent Labs  Lab 01/27/20 2028 01/28/20 4010 01/29/20 2725 01/30/20 3664  WBC 7.4 7.8 6.0 17.9*  NEUTROABS 3.9  --  5.2 14.4*  HGB 11.8* 11.5* 11.1* 10.8*  HCT 34.4* 33.8* 31.6* 29.8*  MCV 85.8 86.0 84.3 82.8  PLT 373 347 371 132*   Basic Metabolic Panel: Recent Labs  Lab 01/27/20 2028 01/28/20 0513 01/29/20 0333 01/30/20 0218  NA 140  --  140 138  K 4.8  --  5.2* 4.6  CL 109  --  109 108  CO2 27  --  25 23  GLUCOSE 103*  --  140* 137*  BUN 45*  --  43* 47*  CREATININE 2.34* 2.25* 2.38* 2.42*  CALCIUM 7.2*  --  7.7* 7.7*  MG  --   --  1.9 1.7  PHOS  --   --  4.6 4.9*   GFR: Estimated Creatinine Clearance: 53.3 mL/min (A) (by C-G formula based on SCr of 2.42 mg/dL (H)). Liver Function Tests: Recent Labs  Lab 01/27/20 2028 01/29/20 0333 01/30/20 0218  AST 16 13* 13*  ALT 9 7 7   ALKPHOS 53 43 38  BILITOT 0.5 0.2* 0.4  PROT 3.3* 3.9* 3.2*  ALBUMIN <1.0* 1.3* 1.0*    No results for input(s): LIPASE, AMYLASE in the last 168 hours. No results for input(s): AMMONIA in the last 168 hours. Coagulation Profile: Recent Labs  Lab 01/29/20 0333  INR 1.1   Cardiac Enzymes: No results for input(s): CKTOTAL, CKMB, CKMBINDEX, TROPONINI in the last 168 hours. BNP (last 3 results) No results for input(s): PROBNP in the last 8760 hours. HbA1C: No results for input(s): HGBA1C in the last 72 hours. CBG: No results for input(s): GLUCAP in the last 168 hours. Lipid Profile: No results for input(s): CHOL, HDL, LDLCALC, TRIG, CHOLHDL, LDLDIRECT in the last 72 hours. Thyroid Function Tests: No results for input(s): TSH, T4TOTAL, FREET4, T3FREE, THYROIDAB in the last 72 hours. Anemia Panel: Recent Labs    01/29/20 0333  VITAMINB12 159*  FOLATE 5.5*  FERRITIN 434*  TIBC NOT CALCULATED  IRON 53  RETICCTPCT 0.8   Sepsis Labs: No results for input(s): PROCALCITON, LATICACIDVEN in the last 168 hours.  Recent Results (from the past 240 hour(s))  SARS Coronavirus 2 by RT PCR (hospital order, performed in [facility] hospital lab) Nasopharyngeal Nasopharyngeal Swab     Status: None   Collection Time: 01/28/20  4:49 AM   Specimen: Nasopharyngeal Swab  Result Value Ref Range Status   SARS Coronavirus 2 NEGATIVE NEGATIVE Final    Comment: (NOTE) SARS-CoV-2 target nucleic acids are NOT DETECTED.  The SARS-CoV-2 RNA is generally detectable in upper and lower respiratory specimens during the acute phase of infection. The lowest concentration of SARS-CoV-2 viral copies this assay can detect is 250 copies / mL. A negative result does not preclude SARS-CoV-2 infection and should not be used as the sole basis for treatment or other patient management decisions.  A negative result may occur with improper specimen collection / handling, submission of specimen other than nasopharyngeal swab, presence of viral mutation(s) within the areas targeted by this assay, and  inadequate number of viral copies (<250 copies / mL). A negative result must be combined with clinical observations, patient history, and epidemiological information.  Fact Sheet for Patients:   StrictlyIdeas.no  Fact Sheet for Healthcare Providers: BankingDealers.co.za  This test is not yet approved or  cleared by the [place] FDA and has been authorized for detection and/or diagnosis of SARS-CoV-2 by FDA under an Emergency Use Authorization (EUA).  This EUA will  remain in effect (meaning this test can be used) for the duration of the COVID-19 declaration under Section 564(b)(1) of the Act, 21 U.S.C. section 360bbb-3(b)(1), unless the authorization is terminated or revoked sooner.  Performed at [facility], [place] [address], [place], [place] [address]   Culture, Urine     Status: None   Collection Time: 01/29/20 11:46 AM   Specimen: Urine, Random  Result Value Ref Range Status   Specimen Description URINE, RANDOM  Final   Special Requests NONE  Final   Culture   Final    NO GROWTH Performed at [facility], [place] [address] [place], [place] [address]    Report Status 01/30/2020 FINAL  Final         Radiology Studies: ECHOCARDIOGRAM COMPLETE  Result Date: 01/29/2020    ECHOCARDIOGRAM REPORT   Patient Name:   Philip Trevino Date of Exam: 01/29/2020 Medical Rec #:  [id number]     Height:       70.0 in Accession #:    [id number]    Weight:       199.3 lb Date of Birth:  12-16-1995      BSA:          2.084 m Patient Age:    24 years      BP:           127/77 mmHg Patient Gender: M             HR:           70 bpm. Exam Location:  Inpatient Procedure: 2D Echo, Cardiac Doppler and Color Doppler Indications:    Anasarca associated with disorder of kidney  History:        Patient has no prior history of Echocardiogram examinations.                 Risk Factors:Hypertension. Anasarca. Acute renal failure.  Sonographer:    [name] Lefort RDCS (AE) Referring Phys: [id number] [place]  1. Normal GLS -17. Left ventricular ejection fraction, by estimation, is 60 to 65%. The left ventricle has normal function. The left ventricle has no regional wall motion abnormalities. Left ventricular diastolic parameters were normal.  2. Right ventricular systolic function is normal. The right ventricular size is normal. There is normal pulmonary artery systolic pressure.  3. The mitral valve is normal in structure. Trivial mitral valve regurgitation. No evidence of mitral stenosis.  4. The aortic valve is tricuspid. Aortic valve regurgitation is not visualized. No aortic stenosis is present.  5. The inferior vena cava is normal in size with greater than 50% respiratory variability, suggesting right atrial pressure of 3 mmHg. FINDINGS  Left Ventricle: Normal GLS -17. Left ventricular ejection fraction, by estimation, is 60 to 65%. The left ventricle has normal function. The left ventricle has no regional wall motion abnormalities. The left ventricular internal cavity size was normal in size. There is no left ventricular hypertrophy. Left ventricular diastolic parameters were normal. Right Ventricle: The right ventricular size is normal. No increase in right ventricular wall thickness. Right ventricular systolic function is normal. There is normal pulmonary artery systolic pressure. The tricuspid regurgitant velocity is 1.59 m/s, and  with an assumed right atrial pressure of 10 mmHg, the estimated right ventricular systolic pressure is 09.4 mmHg. Left Atrium: Left atrial size was normal in size. Right Atrium: Right atrial size was normal in size. Pericardium: There is no evidence of pericardial effusion. Mitral Valve: The  mitral valve is normal in structure. There is mild thickening of the mitral valve leaflet(s). Normal mobility of the mitral valve leaflets. Trivial mitral valve regurgitation. No evidence of mitral valve stenosis. MV peak gradient,  3.7 mmHg. The mean mitral valve gradient is 1.0 mmHg. Tricuspid Valve: The tricuspid valve is normal in structure. Tricuspid valve regurgitation is mild . No evidence of tricuspid stenosis. Aortic Valve: The aortic valve is tricuspid. Aortic valve regurgitation is not visualized. No aortic stenosis is present. Aortic valve mean gradient measures 4.0 mmHg. Aortic valve peak gradient measures 8.4 mmHg. Aortic valve area, by VTI measures 2.48 cm. Pulmonic Valve: The pulmonic valve was normal in structure. Pulmonic valve regurgitation is not visualized. No evidence of pulmonic stenosis. Aorta: The aortic root is normal in size and structure. Venous: The inferior vena cava is normal in size with greater than 50% respiratory variability, suggesting right atrial pressure of 3 mmHg. IAS/Shunts: No atrial level shunt detected by color flow Doppler.  LEFT VENTRICLE PLAX 2D LVIDd:         5.10 cm  Diastology LVIDs:         3.10 cm  LV e' lateral:   15.20 cm/s LV PW:         1.30 cm  LV E/e' lateral: 4.8 LV IVS:        1.10 cm  LV e' medial:    10.70 cm/s LVOT diam:     2.10 cm  LV E/e' medial:  6.9 LV SV:         70 LV SV Index:   34 LVOT Area:     3.46 cm                          3D Volume EF:                         3D EF:        64 %                         LV EDV:       147 ml                         LV ESV:       53 ml                         LV SV:        94 ml RIGHT VENTRICLE             IVC RV Basal diam:  3.10 cm     IVC diam: 1.50 cm RV S prime:     15.40 cm/s TAPSE (M-mode): 2.0 cm LEFT ATRIUM             Index       RIGHT ATRIUM           Index LA diam:        3.20 cm 1.54 cm/m  RA Area:     16.10 cm LA Vol (A2C):   72.1 ml 34.59 ml/m RA Volume:   41.50 ml  19.91 ml/m LA Vol (A4C):   50.4 ml 24.18 ml/m LA Biplane Vol: 64.1 ml 30.75 ml/m  AORTIC VALVE AV Area (Vmax):    2.72 cm AV Area (Vmean):   2.53 cm AV Area (VTI):  2.48 cm AV Vmax:           145.00 cm/s AV Vmean:          98.100 cm/s AV VTI:             0.284 m AV Peak Grad:      8.4 mmHg AV Mean Grad:      4.0 mmHg LVOT Vmax:         114.00 cm/s LVOT Vmean:        71.600 cm/s LVOT VTI:          0.203 m LVOT/AV VTI ratio: 0.71  AORTA Ao Root diam: 3.50 cm Ao Asc diam:  2.80 cm MITRAL VALVE               TRICUSPID VALVE MV Area (PHT): 3.91 cm    TR Peak grad:   10.1 mmHg MV Peak grad:  3.7 mmHg    TR Vmax:        159.00 cm/s MV Mean grad:  1.0 mmHg MV Vmax:       0.97 m/s    SHUNTS MV Vmean:      48.7 cm/s   Systemic VTI:  0.20 m MV Decel Time: 194 msec    Systemic Diam: 2.10 cm MV E velocity: 73.50 cm/s MV A velocity: 36.40 cm/s MV E/A ratio:  2.02 Jenkins Rouge MD Electronically signed by Jenkins Rouge MD Signature Date/Time: 01/29/2020/3:19:48 PM    Final    VAS [place] LOWER EXTREMITY VENOUS (DVT)  Result Date: 01/29/2020  Lower Venous DVTStudy Indications: Anasarca.  Risk Factors: Acute kidney injury. Limitations: Significant pitting edema and pain, Comparison Study: No prior study on file for comparison Performing Technologist: Sharion Dove RVS  Examination Guidelines: A complete evaluation includes B-mode imaging, spectral Doppler, color Doppler, and power Doppler as needed of all accessible portions of each vessel. Bilateral testing is considered an integral part of a complete examination. Limited examinations for reoccurring indications may be performed as noted. The reflux portion of the exam is performed with the patient in reverse Trendelenburg.  +---------+---------------+---------+-----------+----------+-------------------+ RIGHT    CompressibilityPhasicitySpontaneityPropertiesThrombus Aging      +---------+---------------+---------+-----------+----------+-------------------+ CFV                     Yes      Yes                  patent by color and                                                       Doppler             +---------+---------------+---------+-----------+----------+-------------------+ FV Prox  Full                                                              +---------+---------------+---------+-----------+----------+-------------------+ FV Mid   Full                                                             +---------+---------------+---------+-----------+----------+-------------------+  FV Distal                                             patent by color and                                                       Doppler             +---------+---------------+---------+-----------+----------+-------------------+ POP      Full           Yes      Yes                                      +---------+---------------+---------+-----------+----------+-------------------+ PTV                                                   patent by color     +---------+---------------+---------+-----------+----------+-------------------+ PERO                                                  patent by color and                                                       Doppler             +---------+---------------+---------+-----------+----------+-------------------+   +---------+---------------+---------+-----------+----------+-------------------+ LEFT     CompressibilityPhasicitySpontaneityPropertiesThrombus Aging      +---------+---------------+---------+-----------+----------+-------------------+ CFV      Full           Yes      Yes                                      +---------+---------------+---------+-----------+----------+-------------------+ FV Prox  Full           Yes      Yes                                      +---------+---------------+---------+-----------+----------+-------------------+ FV Mid   Full                                                             +---------+---------------+---------+-----------+----------+-------------------+ FV Distal  patent by color and                                                        Doppler             +---------+---------------+---------+-----------+----------+-------------------+ PFV                                                   patent by color     +---------+---------------+---------+-----------+----------+-------------------+ POP      Full           Yes      Yes                                      +---------+---------------+---------+-----------+----------+-------------------+ PTV      Full                                                             +---------+---------------+---------+-----------+----------+-------------------+ PERO     Full                                                             +---------+---------------+---------+-----------+----------+-------------------+     Summary: RIGHT: - There is no evidence of deep vein thrombosis in the lower extremity. However, portions of this examination were limited- see technologist comments above.  interstitial fluid noted throughout  LEFT: - There is no evidence of deep vein thrombosis in the lower extremity. However, portions of this examination were limited- see technologist comments above.  Interstitial fluid noted throughout.  *See table(s) above for measurements and observations. Electronically signed by Harold Barban MD on 01/29/2020 at 6:01:58 PM.    Final    [place] EKG SITE RITE  Result Date: 01/30/2020 If Site Rite image not attached, placement could not be confirmed due to current cardiac rhythm.       Scheduled Meds: . amLODipine  5 mg Oral Daily  . diphenhydrAMINE  12.5 mg Intravenous Once  . heparin  5,000 Units Subcutaneous Q8H  . lidocaine      . pantoprazole  40 mg Oral Daily  . predniSONE  60 mg Oral Q breakfast   Continuous Infusions: . cefTRIAXone (ROCEPHIN)  IV 2 g (01/30/20 1251)  . furosemide 120 mg (01/31/20 1025)     LOS: 3 days    Time spent:40 min    Krystyna Cleckley, Geraldo Docker, MD [facility] Pager [phone]  If  7PM-7AM, please contact night-coverage [email or web address] Password [email or web address] 01/31/2020, 11:18 AM

## 2020-01-31 NOTE — Sedation Documentation (Signed)
Central line placement complete. Prepping for kidney biopsy.

## 2020-01-31 NOTE — Procedures (Signed)
Pre Procedure Dx: Philip Trevino Post Procedural Dx: Same  Technically successful US guided biopsy of inferior pole of the left kidney.  EBL: Minimal Note made of a small asymptomatic perinephric hematoma.   Ronny Bacon, MD Pager #: [phone]

## 2020-02-01 LAB — COMPREHENSIVE METABOLIC PANEL
ALT: 10 U/L (ref 0–44)
AST: 16 U/L (ref 15–41)
Albumin: 1.2 g/dL — ABNORMAL LOW (ref 3.5–5.0)
Alkaline Phosphatase: 42 U/L (ref 38–126)
Anion gap: 8 (ref 5–15)
BUN: 35 mg/dL — ABNORMAL HIGH (ref 6–20)
CO2: 27 mmol/L (ref 22–32)
Calcium: 7.6 mg/dL — ABNORMAL LOW (ref 8.9–10.3)
Chloride: 104 mmol/L (ref 98–111)
Creatinine, Ser: 1.43 mg/dL — ABNORMAL HIGH (ref 0.61–1.24)
GFR calc Af Amer: >60 mL/min (ref >60)
GFR calc non Af Amer: >60 mL/min (ref >60)
Glucose, Bld: 109 mg/dL — ABNORMAL HIGH (ref 70–99)
Potassium: 3.7 mmol/L (ref 3.5–5.1)
Sodium: 139 mmol/L (ref 135–145)
Total Bilirubin: 0.1 mg/dL — ABNORMAL LOW (ref 0.3–1.2)
Total Protein: 3.4 g/dL — ABNORMAL LOW (ref 6.5–8.1)

## 2020-02-01 LAB — CBC WITH DIFFERENTIAL/PLATELET
Abs Immature Granulocytes: 0.12 10*3/uL — ABNORMAL HIGH (ref 0.00–0.07)
Basophils Absolute: 0 10*3/uL (ref 0.0–0.1)
Basophils Relative: 0 %
Eosinophils Absolute: 0 10*3/uL (ref 0.0–0.5)
Eosinophils Relative: 0 %
HCT: 32.8 % — ABNORMAL LOW (ref 39.0–52.0)
Hemoglobin: 11.3 g/dL — ABNORMAL LOW (ref 13.0–17.0)
Immature Granulocytes: 1 %
Lymphocytes Relative: 21 %
Lymphs Abs: 3.2 10*3/uL (ref 0.7–4.0)
MCH: 28.7 pg (ref 26.0–34.0)
MCHC: 34.5 g/dL (ref 30.0–36.0)
MCV: 83.2 fL (ref 80.0–100.0)
Monocytes Absolute: 1.6 10*3/uL — ABNORMAL HIGH (ref 0.1–1.0)
Monocytes Relative: 11 %
Neutro Abs: 10.2 10*3/uL — ABNORMAL HIGH (ref 1.7–7.7)
Neutrophils Relative %: 67 %
Platelets: 441 10*3/uL — ABNORMAL HIGH (ref 150–400)
RBC: 3.94 MIL/uL — ABNORMAL LOW (ref 4.22–5.81)
RDW: 11.7 % (ref 11.5–15.5)
WBC: 15.2 10*3/uL — ABNORMAL HIGH (ref 4.0–10.5)
nRBC: 0 % (ref 0.0–0.2)

## 2020-02-01 LAB — MAGNESIUM: Magnesium: 1.6 mg/dL — ABNORMAL LOW (ref 1.7–2.4)

## 2020-02-01 LAB — PHOSPHORUS: Phosphorus: 5.6 mg/dL — ABNORMAL HIGH (ref 2.5–4.6)

## 2020-02-01 MED ORDER — SODIUM CHLORIDE 0.9% FLUSH
10.0000 mL | Freq: Two times a day (BID) | INTRAVENOUS | Status: DC
  Administered 2020-02-01: 10 mL

## 2020-02-01 MED ORDER — CHLORHEXIDINE GLUCONATE CLOTH 2 % EX PADS
6.0000 | MEDICATED_PAD | Freq: Every day | CUTANEOUS | Status: DC
  Administered 2020-02-01: 6 via TOPICAL

## 2020-02-01 MED ORDER — SODIUM CHLORIDE 0.9 % IV SOLN
INTRAVENOUS | Status: DC | PRN
  Administered 2020-02-01 – 2020-02-02 (×2): 250 mL via INTRAVENOUS

## 2020-02-01 MED ORDER — SODIUM CHLORIDE 0.9% FLUSH: 10.0000 mL | INTRAVENOUS | Status: DC | PRN

## 2020-02-01 MED ORDER — ALBUMIN HUMAN 25 % IV SOLN
50.0000 g | Freq: Once | INTRAVENOUS | Status: AC
  Administered 2020-02-01: 50 g via INTRAVENOUS
  Filled 2020-02-01: qty 200

## 2020-02-01 NOTE — TOC Initial Note (Addendum)
Transition of Care [facility]) - Initial/Assessment Note    Patient Details  Name: Philip Trevino MRN: [id number] Date of Birth: [birth year]  [facility]) CM/SW Contact:    Marilu Favre, RN Phone Number: 02/01/2020, 2:51 PM  Clinical Narrative:                 Patient states he currently is not homeless, and has transportation home and to appointments.   He has gone to [place] before and would like to continue to follow up there. Follow up appointment made and placed on AVS.   Address in Epic is his mother's address and he does not want the address changed ( receives mail there). Patient currently staying at [place], [place].   Phone [phone] is the number for the mother of his child. His number is [phone]   Patient does not have insurance. NCM will assist with discharge prescriptions with [facility].   Expected Discharge Plan: Home/Self Care     Patient Goals and CMS Choice Patient states their goals for this hospitalization and ongoing recovery are:: to return to home CMS Medicare.gov Compare Post Acute Care list provided to:: Patient    Expected Discharge Plan and Services Expected Discharge Plan: Home/Self Care   Discharge Planning Services: CM Consult   Living arrangements for the past 2 months: Single Family Home                   DME Agency: NA       HH Arranged: NA          Prior Living Arrangements/Services Living arrangements for the past 2 months: Single Family Home   Patient language and need for interpreter reviewed:: Yes Do you feel safe going back to the place where you live?: Yes      Need for Family Participation in Patient Care: Yes (Comment) Care giver support system in place?: Yes (comment)   Criminal Activity/Legal Involvement Pertinent to Current Situation/Hospitalization: No - Comment as needed  Activities of Daily Living Home Assistive Devices/Equipment: None ADL Screening  (condition at time of admission) Patient's cognitive ability adequate to safely complete daily activities?: Yes Is the patient deaf or have difficulty hearing?: No Does the patient have difficulty seeing, even when wearing glasses/contacts?: No Does the patient have difficulty concentrating, remembering, or making decisions?: No Patient able to express need for assistance with ADLs?: Yes Does the patient have difficulty dressing or bathing?: No Independently performs ADLs?: Yes (appropriate for developmental age) Does the patient have difficulty walking or climbing stairs?: No Weakness of Legs: None Weakness of Arms/Hands: None  Permission Sought/Granted   Permission granted to share information with : No              Emotional Assessment Appearance:: Appears stated age Attitude/Demeanor/Rapport: Engaged Affect (typically observed): Accepting Orientation: : Oriented to Self, Oriented to Place, Oriented to  Time, Oriented to Situation Alcohol / Substance Use: Not Applicable Psych Involvement: No (comment)  Admission diagnosis:  Anasarca [R60.1] Peripheral edema [R60.9] Acute CHF (congestive heart failure) (HCC) [I50.9] Nephrotic syndrome [N04.9] Patient Active Problem List   Diagnosis Date Noted  . Anasarca 01/28/2020  . ARF (acute renal failure) ([place]) 01/28/2020  . Normochromic normocytic anemia 01/28/2020  . Elevated blood pressure reading 01/28/2020  . 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 Anemia, unspecified 01/28/2020  . Benign essential HTN 01/28/2020  . Nephrotic syndrome    PCP:  Domenica Fail  Shanon Brow, PA-C Pharmacy:   [id number] - [place], [place] [address] [place] [address] Phone: [phone] Fax: [phone]  [facility] [place] [place] - [address] [place] [address] Phone: [phone] Fax: [phone]     Social  Determinants of Health (SDOH) Interventions    Readmission Risk Interventions No flowsheet data found.

## 2020-02-01 NOTE — Progress Notes (Signed)
Patient ID: Philip Trevino, male   DOB: 11/19/1995, 24 y.o.   MRN: [id number] S:   I/O 1.4 / 4.2 yesterday. Feels abd less full.  No new complaints.  O:BP 130/90 (BP Location: Right Arm)   Pulse 60   Temp (!) 97.5 F (36.4 C)   Resp 15   Ht 5' 10" (1.778 m)   Wt 85.6 kg   SpO2 100%   BMI 27.08 kg/m   Intake/Output Summary (Last 24 hours) at 02/01/2020 1226 Last data filed at 02/01/2020 1029 Gross per 24 hour  Intake 1395.08 ml  Output 3580 ml  Net -2184.92 ml   Intake/Output: I/O last 3 completed shifts: In: 1563.1 [P.O.:1278; I.V.:11.1; IV Piggyback:274] Out: 6381 [Urine:6335]  Intake/Output this shift:  Total I/O In: 240 [P.O.:240] Out: 430 [Urine:430] Weight change: -5.891 kg Gen: NAD CVS: no rub Resp: cta Abd: +BS, soft, NT/ND Ext: 2+ dependent anasarca  Recent Labs  Lab 01/27/20 2028 01/28/20 0513 01/29/20 0333 01/30/20 0218 02/01/20 0422  NA 140  --  140 138 139  K 4.8  --  5.2* 4.6 3.7  CL 109  --  109 108 104  CO2 27  --  _0 GLUCOSE 103*  --  140* 137* 109*  BUN 45*  --  43* 47* 35*  CREATININE 2.34* 2.25* 2.38* 2.42* 1.43*  ALBUMIN <1.0*  --  1.3* 1.0* 1.2*  CALCIUM 7.2*  --  7.7* 7.7* 7.6*  PHOS  --   --  4.6 4.9* 5.6*  AST 16  --  13* 13* 16  ALT 9  --  _1 Liver Function Tests: Recent Labs  Lab 01/29/20 0333 01/30/20 0218 02/01/20 0422  AST 13* 13* 16  ALT _2 ALKPHOS 43 38 42  BILITOT 0.2* 0.4 0.1*  PROT 3.9* 3.2* 3.4*  ALBUMIN 1.3* 1.0* 1.2*   No results for input(s): LIPASE, AMYLASE in the last 168 hours. No results for input(s): AMMONIA in the last 168 hours. CBC: Recent Labs  Lab 01/27/20 2028 01/27/20 2028 01/28/20 0513 01/28/20 0513 01/29/20 0333 01/30/20 0218 02/01/20 0422  WBC 7.4   < > 7.8   < > 6.0 17.9* 15.2*  NEUTROABS 3.9   < >  --   --  5.2 14.4* 10.2*  HGB 11.8*   < > 11.5*   < > 11.1* 10.8* 11.3*  HCT 34.4*   < > 33.8*   < > 31.6* 29.8* 32.8*  MCV 85.8  --  86.0  --  84.3 82.8 83.2  PLT 373    < > 347   < > 371 423* 441*   < > = values in this interval not displayed.   Cardiac Enzymes: No results for input(s): CKTOTAL, CKMB, CKMBINDEX, TROPONINI in the last 168 hours. CBG: No results for input(s): GLUCAP in the last 168 hours.  Iron Studies:  No results for input(s): IRON, TIBC, TRANSFERRIN, FERRITIN in the last 72 hours. Studies/Results: IR Fluoro Guide CV Line Right  Result Date: 01/31/2020 INDICATION: Acute on chronic nephrotic syndrome. Patient presents today for ultrasound-guided random renal biopsy for tissue diagnostic purposes. Of note, the patient underwent ultrasound-guided biopsy of the right kidney on 03/22/2020. Poor venous access. In need of durable intravenous access while admitted to the hospital. EXAM: 1. ULTRASOUND AND FLUOROSCOPIC GUIDED NON TUNNELED CENTRAL VENOUS CATHETER INSERTION 2. ULTRASOUND-GUIDED BIOPSY OF THE INFERIOR POLE OF THE LEFT KIDNEY. COMPARISON:  Ultrasound-guided random renal biopsy-03/23/2019; CT abdomen pelvis-08/27/2017  MEDICATIONS: None ANESTHESIA/SEDATION: Moderate (conscious) sedation was employed during this procedure. A total of Versed 2 mg and Fentanyl 75 mcg was administered intravenously. Moderate Sedation Time: 33 minutes. The patient's level of consciousness and vital signs were monitored continuously by radiology nursing throughout the procedure under my direct supervision. FLUOROSCOPY TIME:  6 seconds (2 mGy) COMPLICATIONS: SIR Level A - No therapy, no consequence. Procedure complicated by development of a tiny asymptomatic left-sided perinephric hematoma. PROCEDURE: Informed written consent was obtained from the patient after a discussion of the risks, benefits and alternatives to treatment. The patient understands and consents the procedure. A timeout was performed prior to the initiation of the procedure. The procedures, risks, benefits, and alternatives were explained to the patient and informed written consent was obtained. A timeout  was performed prior to the initiation of the procedure. The right neck and upper chest was prepped with chlorhexidine in a sterile fashion, and a sterile drape was applied covering the operative field. Maximum barrier sterile technique with sterile gowns and gloves were used for the procedure. A timeout was performed prior to the initiation of the procedure. Local anesthesia was provided with 1% lidocaine. Under direct ultrasound guidance, the right internal jugular vein was accessed with a micropuncture kit after the overlying soft tissues were anesthetized with 1% lidocaine. After the overlying soft tissues were anesthetized, a small venotomy incision was created and a micropuncture kit was utilized to access the right right internal jugular vein. Real-time ultrasound guidance was utilized for vascular access including the acquisition of a permanent ultrasound image documenting patency of the accessed vessel. A guidewire was advanced to the level of the superior caval-atrial junction for measurement purposes and the PICC line was cut to length. A peel-away sheath was placed and a 18 cm, 5 [place], dual lumen was inserted to level of the superior caval-atrial junction. A post procedure spot fluoroscopic was obtained. The catheter easily aspirated and flushed and was secured in place with stat lock device. A dressing was applied. _________________________________________________________ Attention was now paid towards the ultrasound-guided random renal biopsy. Ultrasound scanning was performed of the bilateral flanks. The inferior pole of the left kidney was selected for biopsy due to location and sonographic window. Additionally, patient had previously undergone ultrasound-guided biopsy of the inferior pole the right kidney. The procedure was planned. The operative site was prepped and draped in the usual sterile fashion. The overlying soft tissues were anesthetized with 1% lidocaine with epinephrine. A 17 gauge core  needle biopsy device was advanced into the inferior cortex of the left kidney and 3 core biopsies were obtained under direct ultrasound guidance. Images were saved for documentation purposes. The biopsy device was removed and hemostasis was obtained with manual compression. Post procedural scanning was negative for significant post procedural hemorrhage or additional complication. A dressing was placed. The patient tolerated the procedure well without immediate post procedural complication. FINDINGS: After catheter placement, the tip lies within the superior cavoatrial junction. The catheter aspirates and flushes normally and is ready for immediate use. Sonographic evaluation confirms appropriate positioning of the biopsy needle within the inferior pole of the left kidney. Postprocedural imaging demonstrates development of a tiny asymptomatic left-sided perinephric hematoma (image 8), not found to enlarge on delayed sonographic evaluation. IMPRESSION: 1. Technically successful ultrasound-guided biopsy of the inferior pole of the left kidney. Procedure complicated by development of a tiny asymptomatic left-sided perinephric hematoma. 2. Successful ultrasound and fluoroscopic guided placement of a right internal jugular vein approach, 18 cm, 5  [place], dual lumen non tunneled central venous catheter with tip at the superior caval-atrial junction. The PICC line is ready for immediate use. Electronically Signed   By: Sandi Mariscal M.D.   On: 01/31/2020 11:34   IR US Guide Vasc Access Right  Result Date: 01/31/2020 INDICATION: Acute on chronic nephrotic syndrome. Patient presents today for ultrasound-guided random renal biopsy for tissue diagnostic purposes. Of note, the patient underwent ultrasound-guided biopsy of the right kidney on 03/22/2020. Poor venous access. In need of durable intravenous access while admitted to the hospital. EXAM: 1. ULTRASOUND AND FLUOROSCOPIC GUIDED NON TUNNELED CENTRAL VENOUS CATHETER  INSERTION 2. ULTRASOUND-GUIDED BIOPSY OF THE INFERIOR POLE OF THE LEFT KIDNEY. COMPARISON:  Ultrasound-guided random renal biopsy-03/23/2019; CT abdomen pelvis-08/27/2017 MEDICATIONS: None ANESTHESIA/SEDATION: Moderate (conscious) sedation was employed during this procedure. A total of Versed 2 mg and Fentanyl 75 mcg was administered intravenously. Moderate Sedation Time: 33 minutes. The patient's level of consciousness and vital signs were monitored continuously by radiology nursing throughout the procedure under my direct supervision. FLUOROSCOPY TIME:  6 seconds (2 mGy) COMPLICATIONS: SIR Level A - No therapy, no consequence. Procedure complicated by development of a tiny asymptomatic left-sided perinephric hematoma. PROCEDURE: Informed written consent was obtained from the patient after a discussion of the risks, benefits and alternatives to treatment. The patient understands and consents the procedure. A timeout was performed prior to the initiation of the procedure. The procedures, risks, benefits, and alternatives were explained to the patient and informed written consent was obtained. A timeout was performed prior to the initiation of the procedure. The right neck and upper chest was prepped with chlorhexidine in a sterile fashion, and a sterile drape was applied covering the operative field. Maximum barrier sterile technique with sterile gowns and gloves were used for the procedure. A timeout was performed prior to the initiation of the procedure. Local anesthesia was provided with 1% lidocaine. Under direct ultrasound guidance, the right internal jugular vein was accessed with a micropuncture kit after the overlying soft tissues were anesthetized with 1% lidocaine. After the overlying soft tissues were anesthetized, a small venotomy incision was created and a micropuncture kit was utilized to access the right right internal jugular vein. Real-time ultrasound guidance was utilized for vascular access  including the acquisition of a permanent ultrasound image documenting patency of the accessed vessel. A guidewire was advanced to the level of the superior caval-atrial junction for measurement purposes and the PICC line was cut to length. A peel-away sheath was placed and a 18 cm, 5 [place], dual lumen was inserted to level of the superior caval-atrial junction. A post procedure spot fluoroscopic was obtained. The catheter easily aspirated and flushed and was secured in place with stat lock device. A dressing was applied. _________________________________________________________ Attention was now paid towards the ultrasound-guided random renal biopsy. Ultrasound scanning was performed of the bilateral flanks. The inferior pole of the left kidney was selected for biopsy due to location and sonographic window. Additionally, patient had previously undergone ultrasound-guided biopsy of the inferior pole the right kidney. The procedure was planned. The operative site was prepped and draped in the usual sterile fashion. The overlying soft tissues were anesthetized with 1% lidocaine with epinephrine. A 17 gauge core needle biopsy device was advanced into the inferior cortex of the left kidney and 3 core biopsies were obtained under direct ultrasound guidance. Images were saved for documentation purposes. The biopsy device was removed and hemostasis was obtained with manual compression. Post procedural scanning was negative for  significant post procedural hemorrhage or additional complication. A dressing was placed. The patient tolerated the procedure well without immediate post procedural complication. FINDINGS: After catheter placement, the tip lies within the superior cavoatrial junction. The catheter aspirates and flushes normally and is ready for immediate use. Sonographic evaluation confirms appropriate positioning of the biopsy needle within the inferior pole of the left kidney. Postprocedural imaging demonstrates  development of a tiny asymptomatic left-sided perinephric hematoma (image 8), not found to enlarge on delayed sonographic evaluation. IMPRESSION: 1. Technically successful ultrasound-guided biopsy of the inferior pole of the left kidney. Procedure complicated by development of a tiny asymptomatic left-sided perinephric hematoma. 2. Successful ultrasound and fluoroscopic guided placement of a right internal jugular vein approach, 18 cm, 5 [place], dual lumen non tunneled central venous catheter with tip at the superior caval-atrial junction. The PICC line is ready for immediate use. Electronically Signed   By: Sandi Mariscal M.D.   On: 01/31/2020 11:34   IR KIDNEY BIOPSY  Result Date: 01/31/2020 INDICATION: Acute on chronic nephrotic syndrome. Patient presents today for ultrasound-guided random renal biopsy for tissue diagnostic purposes. Of note, the patient underwent ultrasound-guided biopsy of the right kidney on 03/22/2020. Poor venous access. In need of durable intravenous access while admitted to the hospital. EXAM: 1. ULTRASOUND AND FLUOROSCOPIC GUIDED NON TUNNELED CENTRAL VENOUS CATHETER INSERTION 2. ULTRASOUND-GUIDED BIOPSY OF THE INFERIOR POLE OF THE LEFT KIDNEY. COMPARISON:  Ultrasound-guided random renal biopsy-03/23/2019; CT abdomen pelvis-08/27/2017 MEDICATIONS: None ANESTHESIA/SEDATION: Moderate (conscious) sedation was employed during this procedure. A total of Versed 2 mg and Fentanyl 75 mcg was administered intravenously. Moderate Sedation Time: 33 minutes. The patient's level of consciousness and vital signs were monitored continuously by radiology nursing throughout the procedure under my direct supervision. FLUOROSCOPY TIME:  6 seconds (2 mGy) COMPLICATIONS: SIR Level A - No therapy, no consequence. Procedure complicated by development of a tiny asymptomatic left-sided perinephric hematoma. PROCEDURE: Informed written consent was obtained from the patient after a discussion of the risks, benefits  and alternatives to treatment. The patient understands and consents the procedure. A timeout was performed prior to the initiation of the procedure. The procedures, risks, benefits, and alternatives were explained to the patient and informed written consent was obtained. A timeout was performed prior to the initiation of the procedure. The right neck and upper chest was prepped with chlorhexidine in a sterile fashion, and a sterile drape was applied covering the operative field. Maximum barrier sterile technique with sterile gowns and gloves were used for the procedure. A timeout was performed prior to the initiation of the procedure. Local anesthesia was provided with 1% lidocaine. Under direct ultrasound guidance, the right internal jugular vein was accessed with a micropuncture kit after the overlying soft tissues were anesthetized with 1% lidocaine. After the overlying soft tissues were anesthetized, a small venotomy incision was created and a micropuncture kit was utilized to access the right right internal jugular vein. Real-time ultrasound guidance was utilized for vascular access including the acquisition of a permanent ultrasound image documenting patency of the accessed vessel. A guidewire was advanced to the level of the superior caval-atrial junction for measurement purposes and the PICC line was cut to length. A peel-away sheath was placed and a 18 cm, 5 [place], dual lumen was inserted to level of the superior caval-atrial junction. A post procedure spot fluoroscopic was obtained. The catheter easily aspirated and flushed and was secured in place with stat lock device. A dressing was applied. _________________________________________________________ Attention was now paid  towards the ultrasound-guided random renal biopsy. Ultrasound scanning was performed of the bilateral flanks. The inferior pole of the left kidney was selected for biopsy due to location and sonographic window. Additionally, patient  had previously undergone ultrasound-guided biopsy of the inferior pole the right kidney. The procedure was planned. The operative site was prepped and draped in the usual sterile fashion. The overlying soft tissues were anesthetized with 1% lidocaine with epinephrine. A 17 gauge core needle biopsy device was advanced into the inferior cortex of the left kidney and 3 core biopsies were obtained under direct ultrasound guidance. Images were saved for documentation purposes. The biopsy device was removed and hemostasis was obtained with manual compression. Post procedural scanning was negative for significant post procedural hemorrhage or additional complication. A dressing was placed. The patient tolerated the procedure well without immediate post procedural complication. FINDINGS: After catheter placement, the tip lies within the superior cavoatrial junction. The catheter aspirates and flushes normally and is ready for immediate use. Sonographic evaluation confirms appropriate positioning of the biopsy needle within the inferior pole of the left kidney. Postprocedural imaging demonstrates development of a tiny asymptomatic left-sided perinephric hematoma (image 8), not found to enlarge on delayed sonographic evaluation. IMPRESSION: 1. Technically successful ultrasound-guided biopsy of the inferior pole of the left kidney. Procedure complicated by development of a tiny asymptomatic left-sided perinephric hematoma. 2. Successful ultrasound and fluoroscopic guided placement of a right internal jugular vein approach, 18 cm, 5 [place], dual lumen non tunneled central venous catheter with tip at the superior caval-atrial junction. The PICC line is ready for immediate use. Electronically Signed   By: Sandi Mariscal M.D.   On: 01/31/2020 11:34   . amLODipine  10 mg Oral Daily  . Chlorhexidine Gluconate Cloth  6 each Topical Daily  . heparin  5,000 Units Subcutaneous Q8H  . hydrALAZINE  10 mg Intravenous Q6H  . pantoprazole   40 mg Oral Daily  . predniSONE  60 mg Oral Q breakfast  . sodium chloride flush  10-40 mL Intracatheter Q12H    BMET    Component Value Date/Time   NA 139 02/01/2020 0422   K 3.7 02/01/2020 0422   CL 104 02/01/2020 0422   CO2 27 02/01/2020 0422   GLUCOSE 109 (H) 02/01/2020 0422   BUN 35 (H) 02/01/2020 0422   CREATININE 1.43 (H) 02/01/2020 0422   CALCIUM 7.6 (L) 02/01/2020 0422   GFRNONAA >60 02/01/2020 0422   GFRAA >60 02/01/2020 0422   CBC    Component Value Date/Time   WBC 15.2 (H) 02/01/2020 0422   RBC 3.94 (L) 02/01/2020 0422   HGB 11.3 (L) 02/01/2020 0422   HCT 32.8 (L) 02/01/2020 0422   PLT 441 (H) 02/01/2020 0422   MCV 83.2 02/01/2020 0422   MCH 28.7 02/01/2020 0422   MCHC 34.5 02/01/2020 0422   RDW 11.7 02/01/2020 0422   LYMPHSABS 3.2 02/01/2020 0422   MONOABS 1.6 (H) 02/01/2020 0422   EOSABS 0.0 02/01/2020 0422   BASOSABS 0.0 02/01/2020 0422   Assessment/Plan: 1. 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  1. Cont lasix 120 IV BID for now - diuresing well 2. Resumed prednisone 60 mg daily 01/28/20 3. S/p repeat renal biopsy 01/31/20 - should have prelim today or tomorrow 4.  PPI for GI prophylaxis with steroids 5. Hold off on aspirin  due to pending renal biopsy.  Given severe hypoalbuminemia + nephrotic syndrome will resume DVT prophylaxis 3d post biopsy.  Encouraged pt to ambulate. 2. AKI- presumably due to ischemic ATN in setting of nephrotic syndrome and anasarca. Improving 3. Anemia - new since March. Iron level ok and not microcytic. No indication for ESA. CTM 4. HTN- improving with IV diuresis. 5. Dispo - needs to stay inpatient to await prelim renal biopsy results and for IV diuresis.  Expect ok to d/c in next 24-48h with close f/u.  Jannifer Hick MD [facility] Pager [phone]

## 2020-02-01 NOTE — Progress Notes (Signed)
PROGRESS NOTE    Philip Trevino  FGH:[id number] DOB: 1996-02-13 DOA: 01/27/2020 PCP: Clent Demark, PA-C     Brief Narrative:  Philip Trevino is a 24 y.o. BM PMHx  nephrotic syndrome used to be on Lasix and prednisone which patient states has not been taking for 2 to 3 months because he was not able to afford and he was homeless.  Presents to the ER because of increasing peripheral edema with right-sided chest pain starting happening from yesterday.  Pain is pleuritic in nature denies any fever chills.  Denies any nausea vomiting or diarrhea.  ED Course: In the ER patient was afebrile and on exam patient has anasarca with significant fluid in the abdomen lower extremity and facial puffiness.  Labs are remarkable for creatinine increasing from 0.95 and it is around 2.34 hemoglobin 11.8.  Chest x-ray shows bilateral infiltrates abdomen is not recordable.  UA is consistent with proteinuria.  High sensitive troponin was 5.5.  Covid test was negative.  EKG shows normal sinus rhythm.  ER physician discussed with on-call nephrologist Dr. Posey Pronto who advised giving Lasix and admitting for further management.    Subjective: 7/13 afebrile last 24 hours, A/O x4, negative CP, negative S OB, negative abdominal pain      Assessment & Plan: Covid vaccination; negative vaccination   Principal Problem:   Anasarca Active Problems:   ARF (acute renal failure) (HCC)   Normochromic normocytic anemia   Elevated blood pressure reading   Minimal change disease   Anemia, unspecified   Benign essential HTN  Anasarca -See acute renal failure -Echocardiogram echocardiogram see results below  Acute Kidney Injury/Hx Minimal-Change Disease -Patient had been on Lasix and prednisone.  Discussed case with pharmacy and they were unable to see any steroids on patient's inpatient or outpatient [facility]. -Discussed case with Dr Marval Regal Nephrology and he has agreed to evaluate patient. -7/9 per nephrology restart  prednisone 60 mg daily -7/10 Zaroxolyn 10 mg x 3 dose -7/11 per nephrology increase Lasix 120 mg TID -Strict in and out -5.3 L -Daily weight Filed Weights   01/30/20 1228 01/31/20 0600 02/01/20 0519  Weight: 91.5 kg 89 kg 85.6 kg  -7/12 kidney biopsy obtained; awaiting results  -Prednisone 60 mg daily Recent Labs  Lab 01/27/20 2028 01/28/20 0513 01/29/20 0333 01/30/20 0218 02/01/20 0422  CREATININE 2.34* 2.25* 2.38* 2.42* 1.43*   Hypoalbuminemia -7/8 albumin 50 g will administer prior to next dose of Lasix; patient's current albumin<1.0 -7/11 albumin 25 g per nephrology, however patient's albumin 1.0 we will add an additional albumin 25 g, -7/13 Albumin 50 g at least 30 min prior to the next dose of Lasix  UTI -Urine culture negative -7/10 start empiric antibiotic coverage.  Will be completed tomorrow.  Hematuria   Normocytic anemia -Occult blood pending Recent Labs  Lab 01/27/20 2028 01/28/20 0513 01/29/20 0333 01/30/20 0218 02/01/20 0422  HGB 11.8* 11.5* 11.1* 10.8* 11.3*  -Anemia panel not consistent with iron deficiency anemia or macrocytic anemia, therefore by process of elimination will classify it as normocytic anemia  RIGHT pleuritic chest pain/Elevated D-dimer -Multifactorial to include fluid overload, elevated BP.  PE? -Treat underlying problem -D-dimer= 9.48 -VQ scan negative for PE, -7/10 Bilateral lower extremity Doppler; negative for DVT  Essential HTN -7/12 increase Amlodipine 10 mg daily  -7/12 Hydralazine 10 mg QID -See AKI  Leukocytosis -Reactive vs infection vs steroids. -Patient afebrile, no bands, negative left shift argues toward reactive anemia at this point  Pruritus around IV site -  Benadryl 12.5 mg x 1 -Consulted IV team for power glide placement or PICC line -7/12 RIGHT IJ nontunneled dual-lumen PICC placed  DVT prophylaxis: Subcu heparin Code Status: Full Family Communication:  Status is: Inpatient    Dispo: The patient  is from: Home              Anticipated d/c is to: Home              Anticipated d/c date is: 7/16?  Per nephrology              Patient currently unstable      Consultants:  Dr. Donato Heinz nephrology Dr. Sandi Mariscal, IR   Procedures/Significant Events:  7/8 CXR;Small to moderate bilateral pleural effusions with compressive atelectasis. 7/9 VQ scan; low probability PE 7/10 Echocardiogram LVEF= 60 to 65%. 7/12 US guided biopsy of inferior pole of the LEFT kidney. 7/12 placement of right IJ approach non-tunneled dual lumen PICC line with tip at the superior caval-atrial junction      I have personally reviewed and interpreted all radiology studies and my findings are as above.  VENTILATOR SETTINGS:    Cultures 7/9 SARS coronavirus negative 7/10 urine; negative 7/11 blood pending  Antimicrobials: Anti-infectives (From admission, onward)   Start     Ordered Stop   01/29/20 1130  cefTRIAXone (ROCEPHIN) 2 g in sodium chloride 0.9 % 100 mL IVPB     Discontinue     01/29/20 1036 02/02/20 2359       Devices    LINES / TUBES:  placement of right IJ approach non-tunneled dual lumen PICC line with tip at the superior caval-atrial junction 7/12>>>    Continuous Infusions: . sodium chloride 10 mL/hr at 02/01/20 0700  . cefTRIAXone (ROCEPHIN)  IV Stopped (01/31/20 1241)  . furosemide 120 mg (02/01/20 1105)     Objective: Vitals:   01/31/20 1804 01/31/20 2038 02/01/20 0125 02/01/20 0519  BP: (!) 150/93 135/90 138/89 130/90  Pulse: 75 77 87 60  Resp: _0 Temp: 98.5 F (36.9 C) 98.4 F (36.9 C) 98.4 F (36.9 C) (!) 97.5 F (36.4 C)  TempSrc: Oral     SpO2: 100% 99% 98% 100%  Weight:    85.6 kg  Height:        Intake/Output Summary (Last 24 hours) at 02/01/2020 1510 Last data filed at 02/01/2020 1029 Gross per 24 hour  Intake 1275.08 ml  Output 2930 ml  Net -1654.92 ml   Filed Weights   01/30/20 1228 01/31/20 0600 02/01/20 0519  Weight:  91.5 kg 89 kg 85.6 kg   Physical Exam:  General: A/O x4, No acute respiratory distress Eyes: negative scleral hemorrhage, negative anisocoria, negative icterus ENT: Negative Runny nose, negative gingival bleeding, Neck:  Negative scars, masses, torticollis, lymphadenopathy, JVD, RIGHT IJ catheter in place covered and clean no sign of infection Lungs: Clear to auscultation bilaterally without wheezes or crackles Cardiovascular: Regular rate and rhythm without murmur gallop or rub normal S1 and S2 Abdomen: negative abdominal pain, positive but improving, positive soft, bowel sounds, no rebound, positive ascites with improvement, no appreciable mass Extremities: positive with improvement Skin: Negative rashes, lesions, ulcers Psychiatric:  Negative depression, negative anxiety, negative fatigue, negative mania  Central nervous system:  Cranial nerves II through XII intact, tongue/uvula midline, all extremities muscle strength 5/5, sensation intact throughout, negative dysarthria, negative expressive aphasia, negative receptive aphasia.  .     Data Reviewed: Care during the described time interval  was provided by me .  I have reviewed this patient's available data, including medical history, events of note, physical examination, and all test results as part of my evaluation.  CBC: Recent Labs  Lab 01/27/20 2028 01/28/20 0513 01/29/20 0333 01/30/20 0218 02/01/20 0422  WBC 7.4 7.8 6.0 17.9* 15.2*  NEUTROABS 3.9  --  5.2 14.4* 10.2*  HGB 11.8* 11.5* 11.1* 10.8* 11.3*  HCT 34.4* 33.8* 31.6* 29.8* 32.8*  MCV 85.8 86.0 84.3 82.8 83.2  PLT 373 347 371 423* 782*   Basic Metabolic Panel: Recent Labs  Lab 01/27/20 2028 01/28/20 0513 01/29/20 0333 01/30/20 0218 02/01/20 0422  NA 140  --  140 138 139  K 4.8  --  5.2* 4.6 3.7  CL 109  --  109 108 104  CO2 27  --  _0 GLUCOSE 103*  --  140* 137* 109*  BUN 45*  --  43* 47* 35*  CREATININE 2.34* 2.25* 2.38* 2.42* 1.43*  CALCIUM  7.2*  --  7.7* 7.7* 7.6*  MG  --   --  1.9 1.7 1.6*  PHOS  --   --  4.6 4.9* 5.6*   GFR: Estimated Creatinine Clearance: 83 mL/min (A) (by C-G formula based on SCr of 1.43 mg/dL (H)). Liver Function Tests: Recent Labs  Lab 01/27/20 2028 01/29/20 0333 01/30/20 0218 02/01/20 0422  AST 16 13* 13* 16  ALT _1 ALKPHOS 53 43 38 42  BILITOT 0.5 0.2* 0.4 0.1*  PROT 3.3* 3.9* 3.2* 3.4*  ALBUMIN <1.0* 1.3* 1.0* 1.2*   No results for input(s): LIPASE, AMYLASE in the last 168 hours. No results for input(s): AMMONIA in the last 168 hours. Coagulation Profile: Recent Labs  Lab 01/29/20 0333  INR 1.1   Cardiac Enzymes: No results for input(s): CKTOTAL, CKMB, CKMBINDEX, TROPONINI in the last 168 hours. BNP (last 3 results) No results for input(s): PROBNP in the last 8760 hours. HbA1C: No results for input(s): HGBA1C in the last 72 hours. CBG: No results for input(s): GLUCAP in the last 168 hours. Lipid Profile: No results for input(s): CHOL, HDL, LDLCALC, TRIG, CHOLHDL, LDLDIRECT in the last 72 hours. Thyroid Function Tests: No results for input(s): TSH, T4TOTAL, FREET4, T3FREE, THYROIDAB in the last 72 hours. Anemia Panel: No results for input(s): VITAMINB12, FOLATE, FERRITIN, TIBC, IRON, RETICCTPCT in the last 72 hours. Sepsis Labs: No results for input(s): PROCALCITON, LATICACIDVEN in the last 168 hours.  Recent Results (from the past 240 hour(s))  SARS Coronavirus 2 by RT PCR (hospital order, performed in [facility] hospital lab) Nasopharyngeal Nasopharyngeal Swab     Status: None   Collection Time: 01/28/20  4:49 AM   Specimen: Nasopharyngeal Swab  Result Value Ref Range Status   SARS Coronavirus 2 NEGATIVE NEGATIVE Final    Comment: (NOTE) SARS-CoV-2 target nucleic acids are NOT DETECTED.  The SARS-CoV-2 RNA is generally detectable in upper and lower respiratory specimens during the acute phase of infection. The lowest concentration of SARS-CoV-2 viral copies this  assay can detect is 250 copies / mL. A negative result does not preclude SARS-CoV-2 infection and should not be used as the sole basis for treatment or other patient management decisions.  A negative result may occur with improper specimen collection / handling, submission of specimen other than nasopharyngeal swab, presence of viral mutation(s) within the areas targeted by this assay, and inadequate number of viral copies (<250 copies / mL). A negative result must be combined  with clinical observations, patient history, and epidemiological information.  Fact Sheet for Patients:   StrictlyIdeas.no  Fact Sheet for Healthcare Providers: BankingDealers.co.za  This test is not yet approved or  cleared by the [place] FDA and has been authorized for detection and/or diagnosis of SARS-CoV-2 by FDA under an Emergency Use Authorization (EUA).  This EUA will remain in effect (meaning this test can be used) for the duration of the COVID-19 declaration under Section 564(b)(1) of the Act, 21 U.S.C. section 360bbb-3(b)(1), unless the authorization is terminated or revoked sooner.  Performed at [facility], [place] [address], [place], [place] [address]   Culture, Urine     Status: None   Collection Time: 01/29/20 11:46 AM   Specimen: Urine, Random  Result Value Ref Range Status   Specimen Description URINE, RANDOM  Final   Special Requests NONE  Final   Culture   Final    NO GROWTH Performed at [facility], [place] [address], [place], [place] [address]    Report Status 01/30/2020 FINAL  Final  Culture, blood (routine x 2)     Status: None (Preliminary result)   Collection Time: 01/30/20  6:50 PM   Specimen: BLOOD  Result Value Ref Range Status   Specimen Description BLOOD RIGHT ANTECUBITAL  Final   Special Requests   Final    BOTTLES DRAWN AEROBIC AND ANAEROBIC Blood Culture results may not be optimal due to an inadequate volume  of blood received in culture bottles   Culture   Final    NO GROWTH 2 DAYS Performed at [facility], [place] [address] [place], [place] [address]    Report Status PENDING  Incomplete  Culture, blood (routine x 2)     Status: None (Preliminary result)   Collection Time: 01/30/20  7:42 PM   Specimen: BLOOD  Result Value Ref Range Status   Specimen Description BLOOD RIGHT ANTECUBITAL  Final   Special Requests   Final    BOTTLES DRAWN AEROBIC AND ANAEROBIC Blood Culture adequate volume   Culture   Final    NO GROWTH 2 DAYS Performed at [facility], [place] [address], [place], [place] [address]    Report Status PENDING  Incomplete         Radiology Studies: IR Fluoro Guide CV Line Right  Result Date: 01/31/2020 INDICATION: Acute on chronic nephrotic syndrome. Patient presents today for ultrasound-guided random renal biopsy for tissue diagnostic purposes. Of note, the patient underwent ultrasound-guided biopsy of the right kidney on 03/22/2020. Poor venous access. In need of durable intravenous access while admitted to the hospital. EXAM: 1. ULTRASOUND AND FLUOROSCOPIC GUIDED NON TUNNELED CENTRAL VENOUS CATHETER INSERTION 2. ULTRASOUND-GUIDED BIOPSY OF THE INFERIOR POLE OF THE LEFT KIDNEY. COMPARISON:  Ultrasound-guided random renal biopsy-03/23/2019; CT abdomen pelvis-08/27/2017 MEDICATIONS: None ANESTHESIA/SEDATION: Moderate (conscious) sedation was employed during this procedure. A total of Versed 2 mg and Fentanyl 75 mcg was administered intravenously. Moderate Sedation Time: 33 minutes. The patient's level of consciousness and vital signs were monitored continuously by radiology nursing throughout the procedure under my direct supervision. FLUOROSCOPY TIME:  6 seconds (2 mGy) COMPLICATIONS: SIR Level A - No therapy, no consequence. Procedure complicated by development of a tiny asymptomatic left-sided perinephric hematoma. PROCEDURE: Informed written consent was obtained from the  patient after a discussion of the risks, benefits and alternatives to treatment. The patient understands and consents the procedure. A timeout was performed prior to the initiation of the procedure.  The procedures, risks, benefits, and alternatives were explained to the patient and informed written consent was obtained. A timeout was performed prior to the initiation of the procedure. The right neck and upper chest was prepped with chlorhexidine in a sterile fashion, and a sterile drape was applied covering the operative field. Maximum barrier sterile technique with sterile gowns and gloves were used for the procedure. A timeout was performed prior to the initiation of the procedure. Local anesthesia was provided with 1% lidocaine. Under direct ultrasound guidance, the right internal jugular vein was accessed with a micropuncture kit after the overlying soft tissues were anesthetized with 1% lidocaine. After the overlying soft tissues were anesthetized, a small venotomy incision was created and a micropuncture kit was utilized to access the right right internal jugular vein. Real-time ultrasound guidance was utilized for vascular access including the acquisition of a permanent ultrasound image documenting patency of the accessed vessel. A guidewire was advanced to the level of the superior caval-atrial junction for measurement purposes and the PICC line was cut to length. A peel-away sheath was placed and a 18 cm, 5 [place], dual lumen was inserted to level of the superior caval-atrial junction. A post procedure spot fluoroscopic was obtained. The catheter easily aspirated and flushed and was secured in place with stat lock device. A dressing was applied. _________________________________________________________ Attention was now paid towards the ultrasound-guided random renal biopsy. Ultrasound scanning was performed of the bilateral flanks. The inferior pole of the left kidney was selected for biopsy due to  location and sonographic window. Additionally, patient had previously undergone ultrasound-guided biopsy of the inferior pole the right kidney. The procedure was planned. The operative site was prepped and draped in the usual sterile fashion. The overlying soft tissues were anesthetized with 1% lidocaine with epinephrine. A 17 gauge core needle biopsy device was advanced into the inferior cortex of the left kidney and 3 core biopsies were obtained under direct ultrasound guidance. Images were saved for documentation purposes. The biopsy device was removed and hemostasis was obtained with manual compression. Post procedural scanning was negative for significant post procedural hemorrhage or additional complication. A dressing was placed. The patient tolerated the procedure well without immediate post procedural complication. FINDINGS: After catheter placement, the tip lies within the superior cavoatrial junction. The catheter aspirates and flushes normally and is ready for immediate use. Sonographic evaluation confirms appropriate positioning of the biopsy needle within the inferior pole of the left kidney. Postprocedural imaging demonstrates development of a tiny asymptomatic left-sided perinephric hematoma (image 8), not found to enlarge on delayed sonographic evaluation. IMPRESSION: 1. Technically successful ultrasound-guided biopsy of the inferior pole of the left kidney. Procedure complicated by development of a tiny asymptomatic left-sided perinephric hematoma. 2. Successful ultrasound and fluoroscopic guided placement of a right internal jugular vein approach, 18 cm, 5 [place], dual lumen non tunneled central venous catheter with tip at the superior caval-atrial junction. The PICC line is ready for immediate use. Electronically Signed   By: Sandi Mariscal M.D.   On: 01/31/2020 11:34   IR US Guide Vasc Access Right  Result Date: 01/31/2020 INDICATION: Acute on chronic nephrotic syndrome. Patient presents today  for ultrasound-guided random renal biopsy for tissue diagnostic purposes. Of note, the patient underwent ultrasound-guided biopsy of the right kidney on 03/22/2020. Poor venous access. In need of durable intravenous access while admitted to the hospital. EXAM: 1. ULTRASOUND AND FLUOROSCOPIC GUIDED NON TUNNELED CENTRAL VENOUS CATHETER INSERTION 2. ULTRASOUND-GUIDED BIOPSY OF THE INFERIOR POLE OF THE  LEFT KIDNEY. COMPARISON:  Ultrasound-guided random renal biopsy-03/23/2019; CT abdomen pelvis-08/27/2017 MEDICATIONS: None ANESTHESIA/SEDATION: Moderate (conscious) sedation was employed during this procedure. A total of Versed 2 mg and Fentanyl 75 mcg was administered intravenously. Moderate Sedation Time: 33 minutes. The patient's level of consciousness and vital signs were monitored continuously by radiology nursing throughout the procedure under my direct supervision. FLUOROSCOPY TIME:  6 seconds (2 mGy) COMPLICATIONS: SIR Level A - No therapy, no consequence. Procedure complicated by development of a tiny asymptomatic left-sided perinephric hematoma. PROCEDURE: Informed written consent was obtained from the patient after a discussion of the risks, benefits and alternatives to treatment. The patient understands and consents the procedure. A timeout was performed prior to the initiation of the procedure. The procedures, risks, benefits, and alternatives were explained to the patient and informed written consent was obtained. A timeout was performed prior to the initiation of the procedure. The right neck and upper chest was prepped with chlorhexidine in a sterile fashion, and a sterile drape was applied covering the operative field. Maximum barrier sterile technique with sterile gowns and gloves were used for the procedure. A timeout was performed prior to the initiation of the procedure. Local anesthesia was provided with 1% lidocaine. Under direct ultrasound guidance, the right internal jugular vein was accessed with  a micropuncture kit after the overlying soft tissues were anesthetized with 1% lidocaine. After the overlying soft tissues were anesthetized, a small venotomy incision was created and a micropuncture kit was utilized to access the right right internal jugular vein. Real-time ultrasound guidance was utilized for vascular access including the acquisition of a permanent ultrasound image documenting patency of the accessed vessel. A guidewire was advanced to the level of the superior caval-atrial junction for measurement purposes and the PICC line was cut to length. A peel-away sheath was placed and a 18 cm, 5 [place], dual lumen was inserted to level of the superior caval-atrial junction. A post procedure spot fluoroscopic was obtained. The catheter easily aspirated and flushed and was secured in place with stat lock device. A dressing was applied. _________________________________________________________ Attention was now paid towards the ultrasound-guided random renal biopsy. Ultrasound scanning was performed of the bilateral flanks. The inferior pole of the left kidney was selected for biopsy due to location and sonographic window. Additionally, patient had previously undergone ultrasound-guided biopsy of the inferior pole the right kidney. The procedure was planned. The operative site was prepped and draped in the usual sterile fashion. The overlying soft tissues were anesthetized with 1% lidocaine with epinephrine. A 17 gauge core needle biopsy device was advanced into the inferior cortex of the left kidney and 3 core biopsies were obtained under direct ultrasound guidance. Images were saved for documentation purposes. The biopsy device was removed and hemostasis was obtained with manual compression. Post procedural scanning was negative for significant post procedural hemorrhage or additional complication. A dressing was placed. The patient tolerated the procedure well without immediate post procedural  complication. FINDINGS: After catheter placement, the tip lies within the superior cavoatrial junction. The catheter aspirates and flushes normally and is ready for immediate use. Sonographic evaluation confirms appropriate positioning of the biopsy needle within the inferior pole of the left kidney. Postprocedural imaging demonstrates development of a tiny asymptomatic left-sided perinephric hematoma (image 8), not found to enlarge on delayed sonographic evaluation. IMPRESSION: 1. Technically successful ultrasound-guided biopsy of the inferior pole of the left kidney. Procedure complicated by development of a tiny asymptomatic left-sided perinephric hematoma. 2. Successful ultrasound and fluoroscopic guided  placement of a right internal jugular vein approach, 18 cm, 5 [place], dual lumen non tunneled central venous catheter with tip at the superior caval-atrial junction. The PICC line is ready for immediate use. Electronically Signed   By: Sandi Mariscal M.D.   On: 01/31/2020 11:34   IR KIDNEY BIOPSY  Result Date: 01/31/2020 INDICATION: Acute on chronic nephrotic syndrome. Patient presents today for ultrasound-guided random renal biopsy for tissue diagnostic purposes. Of note, the patient underwent ultrasound-guided biopsy of the right kidney on 03/22/2020. Poor venous access. In need of durable intravenous access while admitted to the hospital. EXAM: 1. ULTRASOUND AND FLUOROSCOPIC GUIDED NON TUNNELED CENTRAL VENOUS CATHETER INSERTION 2. ULTRASOUND-GUIDED BIOPSY OF THE INFERIOR POLE OF THE LEFT KIDNEY. COMPARISON:  Ultrasound-guided random renal biopsy-03/23/2019; CT abdomen pelvis-08/27/2017 MEDICATIONS: None ANESTHESIA/SEDATION: Moderate (conscious) sedation was employed during this procedure. A total of Versed 2 mg and Fentanyl 75 mcg was administered intravenously. Moderate Sedation Time: 33 minutes. The patient's level of consciousness and vital signs were monitored continuously by radiology nursing throughout  the procedure under my direct supervision. FLUOROSCOPY TIME:  6 seconds (2 mGy) COMPLICATIONS: SIR Level A - No therapy, no consequence. Procedure complicated by development of a tiny asymptomatic left-sided perinephric hematoma. PROCEDURE: Informed written consent was obtained from the patient after a discussion of the risks, benefits and alternatives to treatment. The patient understands and consents the procedure. A timeout was performed prior to the initiation of the procedure. The procedures, risks, benefits, and alternatives were explained to the patient and informed written consent was obtained. A timeout was performed prior to the initiation of the procedure. The right neck and upper chest was prepped with chlorhexidine in a sterile fashion, and a sterile drape was applied covering the operative field. Maximum barrier sterile technique with sterile gowns and gloves were used for the procedure. A timeout was performed prior to the initiation of the procedure. Local anesthesia was provided with 1% lidocaine. Under direct ultrasound guidance, the right internal jugular vein was accessed with a micropuncture kit after the overlying soft tissues were anesthetized with 1% lidocaine. After the overlying soft tissues were anesthetized, a small venotomy incision was created and a micropuncture kit was utilized to access the right right internal jugular vein. Real-time ultrasound guidance was utilized for vascular access including the acquisition of a permanent ultrasound image documenting patency of the accessed vessel. A guidewire was advanced to the level of the superior caval-atrial junction for measurement purposes and the PICC line was cut to length. A peel-away sheath was placed and a 18 cm, 5 [place], dual lumen was inserted to level of the superior caval-atrial junction. A post procedure spot fluoroscopic was obtained. The catheter easily aspirated and flushed and was secured in place with stat lock device. A  dressing was applied. _________________________________________________________ Attention was now paid towards the ultrasound-guided random renal biopsy. Ultrasound scanning was performed of the bilateral flanks. The inferior pole of the left kidney was selected for biopsy due to location and sonographic window. Additionally, patient had previously undergone ultrasound-guided biopsy of the inferior pole the right kidney. The procedure was planned. The operative site was prepped and draped in the usual sterile fashion. The overlying soft tissues were anesthetized with 1% lidocaine with epinephrine. A 17 gauge core needle biopsy device was advanced into the inferior cortex of the left kidney and 3 core biopsies were obtained under direct ultrasound guidance. Images were saved for documentation purposes. The biopsy device was removed and hemostasis was obtained with  manual compression. Post procedural scanning was negative for significant post procedural hemorrhage or additional complication. A dressing was placed. The patient tolerated the procedure well without immediate post procedural complication. FINDINGS: After catheter placement, the tip lies within the superior cavoatrial junction. The catheter aspirates and flushes normally and is ready for immediate use. Sonographic evaluation confirms appropriate positioning of the biopsy needle within the inferior pole of the left kidney. Postprocedural imaging demonstrates development of a tiny asymptomatic left-sided perinephric hematoma (image 8), not found to enlarge on delayed sonographic evaluation. IMPRESSION: 1. Technically successful ultrasound-guided biopsy of the inferior pole of the left kidney. Procedure complicated by development of a tiny asymptomatic left-sided perinephric hematoma. 2. Successful ultrasound and fluoroscopic guided placement of a right internal jugular vein approach, 18 cm, 5 [place], dual lumen non tunneled central venous catheter with tip  at the superior caval-atrial junction. The PICC line is ready for immediate use. Electronically Signed   By: Sandi Mariscal M.D.   On: 01/31/2020 11:34        Scheduled Meds: . amLODipine  10 mg Oral Daily  . Chlorhexidine Gluconate Cloth  6 each Topical Daily  . heparin  5,000 Units Subcutaneous Q8H  . hydrALAZINE  10 mg Intravenous Q6H  . pantoprazole  40 mg Oral Daily  . predniSONE  60 mg Oral Q breakfast  . sodium chloride flush  10-40 mL Intracatheter Q12H   Continuous Infusions: . sodium chloride 10 mL/hr at 02/01/20 0700  . cefTRIAXone (ROCEPHIN)  IV Stopped (01/31/20 1241)  . furosemide 120 mg (02/01/20 1105)     LOS: 4 days    Time spent:40 min    Gevork Ayyad, Geraldo Docker, MD [facility] Pager [phone]  If 7PM-7AM, please contact night-coverage [email or web address] Password [email or web address] 02/01/2020, 3:10 PM

## 2020-02-02 ENCOUNTER — Inpatient Hospital Stay (HOSPITAL_COMMUNITY): Payer: Self-pay

## 2020-02-02 LAB — COMPREHENSIVE METABOLIC PANEL
ALT: 11 U/L (ref 0–44)
AST: 17 U/L (ref 15–41)
Albumin: 1.6 g/dL — ABNORMAL LOW (ref 3.5–5.0)
Alkaline Phosphatase: 39 U/L (ref 38–126)
Anion gap: 8 (ref 5–15)
BUN: 27 mg/dL — ABNORMAL HIGH (ref 6–20)
CO2: 29 mmol/L (ref 22–32)
Calcium: 8.1 mg/dL — ABNORMAL LOW (ref 8.9–10.3)
Chloride: 100 mmol/L (ref 98–111)
Creatinine, Ser: 1.28 mg/dL — ABNORMAL HIGH (ref 0.61–1.24)
GFR calc Af Amer: >60 mL/min (ref >60)
GFR calc non Af Amer: >60 mL/min (ref >60)
Glucose, Bld: 113 mg/dL — ABNORMAL HIGH (ref 70–99)
Potassium: 3.7 mmol/L (ref 3.5–5.1)
Sodium: 137 mmol/L (ref 135–145)
Total Bilirubin: 0.7 mg/dL (ref 0.3–1.2)
Total Protein: 3.8 g/dL — ABNORMAL LOW (ref 6.5–8.1)

## 2020-02-02 LAB — CBC WITH DIFFERENTIAL/PLATELET
Abs Immature Granulocytes: 0.11 10*3/uL — ABNORMAL HIGH (ref 0.00–0.07)
Basophils Absolute: 0 10*3/uL (ref 0.0–0.1)
Basophils Relative: 0 %
Eosinophils Absolute: 0 10*3/uL (ref 0.0–0.5)
Eosinophils Relative: 0 %
HCT: 33.3 % — ABNORMAL LOW (ref 39.0–52.0)
Hemoglobin: 11.5 g/dL — ABNORMAL LOW (ref 13.0–17.0)
Immature Granulocytes: 1 %
Lymphocytes Relative: 17 %
Lymphs Abs: 2.5 10*3/uL (ref 0.7–4.0)
MCH: 29 pg (ref 26.0–34.0)
MCHC: 34.5 g/dL (ref 30.0–36.0)
MCV: 83.9 fL (ref 80.0–100.0)
Monocytes Absolute: 1.4 10*3/uL — ABNORMAL HIGH (ref 0.1–1.0)
Monocytes Relative: 9 %
Neutro Abs: 10.8 10*3/uL — ABNORMAL HIGH (ref 1.7–7.7)
Neutrophils Relative %: 73 %
Platelets: 418 10*3/uL — ABNORMAL HIGH (ref 150–400)
RBC: 3.97 MIL/uL — ABNORMAL LOW (ref 4.22–5.81)
RDW: 11.9 % (ref 11.5–15.5)
WBC: 14.8 10*3/uL — ABNORMAL HIGH (ref 4.0–10.5)
nRBC: 0 % (ref 0.0–0.2)

## 2020-02-02 LAB — MAGNESIUM: Magnesium: 1.5 mg/dL — ABNORMAL LOW (ref 1.7–2.4)

## 2020-02-02 LAB — PHOSPHORUS: Phosphorus: 5.5 mg/dL — ABNORMAL HIGH (ref 2.5–4.6)

## 2020-02-02 MED ORDER — ASPIRIN 81 MG PO CHEW
81.0000 mg | CHEWABLE_TABLET | Freq: Every day | ORAL | Status: DC
  Administered 2020-02-02: 81 mg via ORAL
  Filled 2020-02-02: qty 1

## 2020-02-02 MED ORDER — ASPIRIN 81 MG PO CHEW: 81.0000 mg | CHEWABLE_TABLET | Freq: Every day | ORAL | 0 refills | Status: DC

## 2020-02-02 MED ORDER — AMLODIPINE BESYLATE 10 MG PO TABS: 10.0000 mg | ORAL_TABLET | Freq: Every day | ORAL | 0 refills | Status: DC

## 2020-02-02 MED ORDER — SULFAMETHOXAZOLE-TRIMETHOPRIM 400-80 MG PO TABS: 1.0000 | ORAL_TABLET | Freq: Every day | ORAL | 0 refills | Status: DC

## 2020-02-02 MED ORDER — SULFAMETHOXAZOLE-TRIMETHOPRIM 400-80 MG PO TABS
1.0000 | ORAL_TABLET | Freq: Every day | ORAL | Status: DC
  Filled 2020-02-02: qty 1

## 2020-02-02 MED ORDER — PANTOPRAZOLE SODIUM 40 MG PO TBEC: 40.0000 mg | DELAYED_RELEASE_TABLET | Freq: Every day | ORAL | 0 refills | Status: DC

## 2020-02-02 MED ORDER — TORSEMIDE 100 MG PO TABS: 100.0000 mg | ORAL_TABLET | Freq: Every day | ORAL | 0 refills | Status: DC

## 2020-02-02 MED ORDER — TORSEMIDE 100 MG PO TABS
100.0000 mg | ORAL_TABLET | Freq: Every day | ORAL | Status: DC
  Filled 2020-02-02: qty 1

## 2020-02-02 MED ORDER — PREDNISONE 20 MG PO TABS: 60.0000 mg | ORAL_TABLET | Freq: Every day | ORAL | 0 refills | Status: AC

## 2020-02-02 MED ORDER — MAGNESIUM SULFATE 2 GM/50ML IV SOLN
2.0000 g | Freq: Once | INTRAVENOUS | Status: AC
  Administered 2020-02-02: 2 g via INTRAVENOUS
  Filled 2020-02-02: qty 50

## 2020-02-02 MED FILL — PANTOPRAZOLE SOD DR 40 MG T: 40 | 30 days supply | Qty: 30 | Fill #0

## 2020-02-02 MED FILL — TORSEMIDE 100 MG TABLET: 100 | 30 days supply | Qty: 30 | Fill #0

## 2020-02-02 MED FILL — ASPIRIN LOW DOSE 81 MG CHEW: 81 | 30 days supply | Qty: 30 | Fill #0

## 2020-02-02 MED FILL — AMLODIPINE BESYLATE 10 MG T: 10 | 30 days supply | Qty: 30 | Fill #0

## 2020-02-02 MED FILL — SULFAMETHOXAZOLE-TMP SS TAB: 400-80 | 30 days supply | Qty: 30 | Fill #0

## 2020-02-02 MED FILL — predniSONE 20 MG TABS: 20 | 30 days supply | Qty: 90 | Fill #0

## 2020-02-02 NOTE — Progress Notes (Signed)
Successful removal of non tunneled right IJ central line placed 01/31/20 in IR.  Suture and catheter removed in tact today at bedside without complication. Hemostasis achieved with manual pressure.  4x4 gauze + tegaderm applied. Patient instructed to leave dressing x 24H, then may remove and shower as usual. Do not submerge until completely healed.  Please see imaging section of Epic for full dictation.  Candiss Norse, PA-C

## 2020-02-02 NOTE — Discharge Summary (Signed)
Physician Discharge Summary  Philip Trevino UDJ:[id number] DOB: [birth year] DOA: 01/27/2020  PCP: Clent Demark, PA-C  Admit date: 01/27/2020 Discharge date: 02/02/2020  Admitted From: Home Disposition: Home  Recommendations for Outpatient Follow-up:  1. Follow up with PCP in 1 week with repeat CBC/BMP 2. Outpatient follow-up with nephrology 3. Follow up in ED if symptoms worsen or new appear   Home Health: No Equipment/Devices: None  Discharge Condition: Stable CODE STATUS: Full Diet recommendation: Heart healthy  Brief/Interim Summary:  24 year old male with history of nephrotic syndrome, noncompliance to medications presented with increased peripheral edema along with chest pain.  In the ED, patient was found to have anasarca with creatinine of 2.34.  Chest x-ray showed bilateral infiltrates.  UA was consistent with proteinuria.  Nephrology was consulted.  He was treated with steroids and high-dose IV Lasix.  He underwent kidney biopsy on 01/31/2020.  Nephrology has cleared the patient for discharge on oral prednisone and torsemide.  Outpatient follow-up with PCP/nephrology.  Patient needs to be compliant with his medications.  Discharge Diagnoses:   Recurrent nephrotic syndrome Anasarca -Nephrology following.  Underwent kidney biopsy on 01/31/2020.  Final pathology report is pending.  Currently on prednisone and IV Lasix. -Nephrology has cleared the patient for discharge today on torsemide 100 mg daily, prednisone 60 mg daily at least for 4 weeks, PPI for GI prophylaxis, Bactrim SS daily for PJP prophylaxis and aspirin 81 daily for DVT prophylaxis. -Patient is to be compliant with his medications.  Outpatient follow-up with nephrology. -Continue with fluid restrictions on discharge  AKI -Improved.  Creatinine 1.28 today.  Outpatient follow-up  UTI -Urine culture negative so far.  Currently on empiric antibiotic.  Afebrile and hemodynamically stable  Normocytic  anemia -Hemoglobin stable.  Outpatient follow-up  Essential hypertension -Continue amlodipine and torsemide on discharge.  Outpatient follow-up  Hypoalbuminemia -From nephrotic syndrome.  Outpatient follow-up  Leukocytosis -Probably reactive from steroid use  Right pleuritic chest pain/elevated D-dimer -VQ scan negative for PE.  Bilateral lower extremity Doppler was negative for DVT  Discharge Instructions  Discharge Instructions    Diet - low sodium heart healthy   Complete by: As directed    Increase activity slowly   Complete by: As directed    No wound care   Complete by: As directed      Allergies as of 02/02/2020      Reactions   Broccoli [brassica Oleracea] Hives   CAULIFLOWER (allergic, also)      Medication List    STOP taking these medications   furosemide 40 MG tablet Commonly known as: Lasix   potassium chloride 10 MEQ tablet Commonly known as: KLOR-CON     TAKE these medications   amLODipine 10 MG tablet Commonly known as: NORVASC Take 1 tablet (10 mg total) by mouth daily.   aspirin 81 MG chewable tablet Chew 1 tablet (81 mg total) by mouth daily.   pantoprazole 40 MG tablet Commonly known as: PROTONIX Take 1 tablet (40 mg total) by mouth daily.   predniSONE 20 MG tablet Commonly known as: DELTASONE Take 3 tablets (60 mg total) by mouth daily with breakfast.   sulfamethoxazole-trimethoprim 400-80 MG tablet Commonly known as: BACTRIM Take 1 tablet by mouth daily.   torsemide 100 MG tablet Commonly known as: DEMADEX Take 1 tablet (100 mg total) by mouth daily.       Follow-up Information    [place] Go to.   Why: February 10, 2020 at 3 pm  Contact information: [place] [address] [phone]       Madelon Lips, MD. Schedule an appointment as soon as possible for a visit in 1 week(s).   Specialty: Nephrology Contact information: [address] [place] [address] [phone]              Allergies  Allergen Reactions  . Broccoli [Brassica Oleracea] Hives    CAULIFLOWER (allergic, also)    Consultations:  Nephrology/IR   Procedures/Studies: DG Chest 2 View  Result Date: 01/27/2020 CLINICAL DATA:  Chest pain. Patient reports worsening peripheral edema. Shortness of breath. EXAM: CHEST - 2 VIEW COMPARISON:  Chest radiograph 03/04/2019 FINDINGS: Heart is normal in size. Small to moderate bilateral pleural effusions with compressive atelectasis. No pulmonary edema. No confluent airspace disease. No pneumothorax. No acute osseous abnormalities are seen. IMPRESSION: Small to moderate bilateral pleural effusions with compressive atelectasis. Electronically Signed   By: Keith Rake M.D.   On: 01/27/2020 21:03   NM Pulmonary Perf and Vent  Result Date: 01/28/2020 CLINICAL DATA:  Nephrotic syndrome, anasarca, elevated D-dimer, lower extremity edema, right-sided chest pain EXAM: NUCLEAR MEDICINE PERFUSION LUNG SCAN TECHNIQUE: Perfusion images were obtained in multiple projections after intravenous injection of radiopharmaceutical. Ventilation scans intentionally deferred if perfusion scan and chest x-ray adequate for interpretation during COVID 19 epidemic. RADIOPHARMACEUTICALS:  3.8 mCi Tc-88mMAA IV COMPARISON:  01/27/2020 FINDINGS: Planar images of the chest are obtained in multiple projections during the perfusion phase of the examination. There are no perfusion defects identified. No evidence of pulmonary embolus. Photopenia at the costophrenic angles compatible with a pleural effusion seen on chest x-ray. IMPRESSION: 1. No evidence of pulmonary embolism, very low probability perfusion scan. Electronically Signed   By: MRanda NgoM.D.   On: 01/28/2020 15:52   IR Fluoro Guide CV Line Right  Result Date: 01/31/2020 INDICATION: Acute on chronic nephrotic syndrome. Patient presents today for ultrasound-guided random renal  biopsy for tissue diagnostic purposes. Of note, the patient underwent ultrasound-guided biopsy of the right kidney on 03/22/2020. Poor venous access. In need of durable intravenous access while admitted to the hospital. EXAM: 1. ULTRASOUND AND FLUOROSCOPIC GUIDED NON TUNNELED CENTRAL VENOUS CATHETER INSERTION 2. ULTRASOUND-GUIDED BIOPSY OF THE INFERIOR POLE OF THE LEFT KIDNEY. COMPARISON:  Ultrasound-guided random renal biopsy-03/23/2019; CT abdomen pelvis-08/27/2017 MEDICATIONS: None ANESTHESIA/SEDATION: Moderate (conscious) sedation was employed during this procedure. A total of Versed 2 mg and Fentanyl 75 mcg was administered intravenously. Moderate Sedation Time: 33 minutes. The patient's level of consciousness and vital signs were monitored continuously by radiology nursing throughout the procedure under my direct supervision. FLUOROSCOPY TIME:  6 seconds (2 mGy) COMPLICATIONS: SIR Level A - No therapy, no consequence. Procedure complicated by development of a tiny asymptomatic left-sided perinephric hematoma. PROCEDURE: Informed written consent was obtained from the patient after a discussion of the risks, benefits and alternatives to treatment. The patient understands and consents the procedure. A timeout was performed prior to the initiation of the procedure. The procedures, risks, benefits, and alternatives were explained to the patient and informed written consent was obtained. A timeout was performed prior to the initiation of the procedure. The right neck and upper chest was prepped with chlorhexidine in a sterile fashion, and a sterile drape was applied covering the operative field. Maximum barrier sterile technique with sterile gowns and gloves were used for the procedure. A timeout was performed prior to the initiation of the procedure. Local anesthesia was provided with 1% lidocaine. Under  direct ultrasound guidance, the right internal jugular vein was accessed with a micropuncture kit after the  overlying soft tissues were anesthetized with 1% lidocaine. After the overlying soft tissues were anesthetized, a small venotomy incision was created and a micropuncture kit was utilized to access the right right internal jugular vein. Real-time ultrasound guidance was utilized for vascular access including the acquisition of a permanent ultrasound image documenting patency of the accessed vessel. A guidewire was advanced to the level of the superior caval-atrial junction for measurement purposes and the PICC line was cut to length. A peel-away sheath was placed and a 18 cm, 5 [place], dual lumen was inserted to level of the superior caval-atrial junction. A post procedure spot fluoroscopic was obtained. The catheter easily aspirated and flushed and was secured in place with stat lock device. A dressing was applied. _________________________________________________________ Attention was now paid towards the ultrasound-guided random renal biopsy. Ultrasound scanning was performed of the bilateral flanks. The inferior pole of the left kidney was selected for biopsy due to location and sonographic window. Additionally, patient had previously undergone ultrasound-guided biopsy of the inferior pole the right kidney. The procedure was planned. The operative site was prepped and draped in the usual sterile fashion. The overlying soft tissues were anesthetized with 1% lidocaine with epinephrine. A 17 gauge core needle biopsy device was advanced into the inferior cortex of the left kidney and 3 core biopsies were obtained under direct ultrasound guidance. Images were saved for documentation purposes. The biopsy device was removed and hemostasis was obtained with manual compression. Post procedural scanning was negative for significant post procedural hemorrhage or additional complication. A dressing was placed. The patient tolerated the procedure well without immediate post procedural complication. FINDINGS: After catheter  placement, the tip lies within the superior cavoatrial junction. The catheter aspirates and flushes normally and is ready for immediate use. Sonographic evaluation confirms appropriate positioning of the biopsy needle within the inferior pole of the left kidney. Postprocedural imaging demonstrates development of a tiny asymptomatic left-sided perinephric hematoma (image 8), not found to enlarge on delayed sonographic evaluation. IMPRESSION: 1. Technically successful ultrasound-guided biopsy of the inferior pole of the left kidney. Procedure complicated by development of a tiny asymptomatic left-sided perinephric hematoma. 2. Successful ultrasound and fluoroscopic guided placement of a right internal jugular vein approach, 18 cm, 5 [place], dual lumen non tunneled central venous catheter with tip at the superior caval-atrial junction. The PICC line is ready for immediate use. Electronically Signed   By: Sandi Mariscal M.D.   On: 01/31/2020 11:34   IR US Guide Vasc Access Right  Result Date: 01/31/2020 INDICATION: Acute on chronic nephrotic syndrome. Patient presents today for ultrasound-guided random renal biopsy for tissue diagnostic purposes. Of note, the patient underwent ultrasound-guided biopsy of the right kidney on 03/22/2020. Poor venous access. In need of durable intravenous access while admitted to the hospital. EXAM: 1. ULTRASOUND AND FLUOROSCOPIC GUIDED NON TUNNELED CENTRAL VENOUS CATHETER INSERTION 2. ULTRASOUND-GUIDED BIOPSY OF THE INFERIOR POLE OF THE LEFT KIDNEY. COMPARISON:  Ultrasound-guided random renal biopsy-03/23/2019; CT abdomen pelvis-08/27/2017 MEDICATIONS: None ANESTHESIA/SEDATION: Moderate (conscious) sedation was employed during this procedure. A total of Versed 2 mg and Fentanyl 75 mcg was administered intravenously. Moderate Sedation Time: 33 minutes. The patient's level of consciousness and vital signs were monitored continuously by radiology nursing throughout the procedure under my  direct supervision. FLUOROSCOPY TIME:  6 seconds (2 mGy) COMPLICATIONS: SIR Level A - No therapy, no consequence. Procedure complicated by development of a tiny  asymptomatic left-sided perinephric hematoma. PROCEDURE: Informed written consent was obtained from the patient after a discussion of the risks, benefits and alternatives to treatment. The patient understands and consents the procedure. A timeout was performed prior to the initiation of the procedure. The procedures, risks, benefits, and alternatives were explained to the patient and informed written consent was obtained. A timeout was performed prior to the initiation of the procedure. The right neck and upper chest was prepped with chlorhexidine in a sterile fashion, and a sterile drape was applied covering the operative field. Maximum barrier sterile technique with sterile gowns and gloves were used for the procedure. A timeout was performed prior to the initiation of the procedure. Local anesthesia was provided with 1% lidocaine. Under direct ultrasound guidance, the right internal jugular vein was accessed with a micropuncture kit after the overlying soft tissues were anesthetized with 1% lidocaine. After the overlying soft tissues were anesthetized, a small venotomy incision was created and a micropuncture kit was utilized to access the right right internal jugular vein. Real-time ultrasound guidance was utilized for vascular access including the acquisition of a permanent ultrasound image documenting patency of the accessed vessel. A guidewire was advanced to the level of the superior caval-atrial junction for measurement purposes and the PICC line was cut to length. A peel-away sheath was placed and a 18 cm, 5 [place], dual lumen was inserted to level of the superior caval-atrial junction. A post procedure spot fluoroscopic was obtained. The catheter easily aspirated and flushed and was secured in place with stat lock device. A dressing was applied.  _________________________________________________________ Attention was now paid towards the ultrasound-guided random renal biopsy. Ultrasound scanning was performed of the bilateral flanks. The inferior pole of the left kidney was selected for biopsy due to location and sonographic window. Additionally, patient had previously undergone ultrasound-guided biopsy of the inferior pole the right kidney. The procedure was planned. The operative site was prepped and draped in the usual sterile fashion. The overlying soft tissues were anesthetized with 1% lidocaine with epinephrine. A 17 gauge core needle biopsy device was advanced into the inferior cortex of the left kidney and 3 core biopsies were obtained under direct ultrasound guidance. Images were saved for documentation purposes. The biopsy device was removed and hemostasis was obtained with manual compression. Post procedural scanning was negative for significant post procedural hemorrhage or additional complication. A dressing was placed. The patient tolerated the procedure well without immediate post procedural complication. FINDINGS: After catheter placement, the tip lies within the superior cavoatrial junction. The catheter aspirates and flushes normally and is ready for immediate use. Sonographic evaluation confirms appropriate positioning of the biopsy needle within the inferior pole of the left kidney. Postprocedural imaging demonstrates development of a tiny asymptomatic left-sided perinephric hematoma (image 8), not found to enlarge on delayed sonographic evaluation. IMPRESSION: 1. Technically successful ultrasound-guided biopsy of the inferior pole of the left kidney. Procedure complicated by development of a tiny asymptomatic left-sided perinephric hematoma. 2. Successful ultrasound and fluoroscopic guided placement of a right internal jugular vein approach, 18 cm, 5 [place], dual lumen non tunneled central venous catheter with tip at the superior  caval-atrial junction. The PICC line is ready for immediate use. Electronically Signed   By: Sandi Mariscal M.D.   On: 01/31/2020 11:34   ECHOCARDIOGRAM COMPLETE  Result Date: 01/29/2020    ECHOCARDIOGRAM REPORT   Patient Name:   Philip Trevino Date of Exam: 01/29/2020 Medical Rec #:  [id number]     Height:  70.0 in Accession #:    [id number]    Weight:       199.3 lb Date of Birth:  04/20/1996      BSA:          2.084 m Patient Age:    24 years      BP:           127/77 mmHg Patient Gender: M             HR:           70 bpm. Exam Location:  Inpatient Procedure: 2D Echo, Cardiac Doppler and Color Doppler Indications:    Anasarca associated with disorder of kidney  History:        Patient has no prior history of Echocardiogram examinations.                 Risk Factors:Hypertension. Anasarca. Acute renal failure.  Sonographer:    [name] Lefort RDCS (AE) Referring Phys: [id number] [place]  1. Normal GLS -17. Left ventricular ejection fraction, by estimation, is 60 to 65%. The left ventricle has normal function. The left ventricle has no regional wall motion abnormalities. Left ventricular diastolic parameters were normal.  2. Right ventricular systolic function is normal. The right ventricular size is normal. There is normal pulmonary artery systolic pressure.  3. The mitral valve is normal in structure. Trivial mitral valve regurgitation. No evidence of mitral stenosis.  4. The aortic valve is tricuspid. Aortic valve regurgitation is not visualized. No aortic stenosis is present.  5. The inferior vena cava is normal in size with greater than 50% respiratory variability, suggesting right atrial pressure of 3 mmHg. FINDINGS  Left Ventricle: Normal GLS -17. Left ventricular ejection fraction, by estimation, is 60 to 65%. The left ventricle has normal function. The left ventricle has no regional wall motion abnormalities. The left ventricular internal cavity size was normal in size. There is no left  ventricular hypertrophy. Left ventricular diastolic parameters were normal. Right Ventricle: The right ventricular size is normal. No increase in right ventricular wall thickness. Right ventricular systolic function is normal. There is normal pulmonary artery systolic pressure. The tricuspid regurgitant velocity is 1.59 m/s, and  with an assumed right atrial pressure of 10 mmHg, the estimated right ventricular systolic pressure is 96.2 mmHg. Left Atrium: Left atrial size was normal in size. Right Atrium: Right atrial size was normal in size. Pericardium: There is no evidence of pericardial effusion. Mitral Valve: The mitral valve is normal in structure. There is mild thickening of the mitral valve leaflet(s). Normal mobility of the mitral valve leaflets. Trivial mitral valve regurgitation. No evidence of mitral valve stenosis. MV peak gradient, 3.7 mmHg. The mean mitral valve gradient is 1.0 mmHg. Tricuspid Valve: The tricuspid valve is normal in structure. Tricuspid valve regurgitation is mild . No evidence of tricuspid stenosis. Aortic Valve: The aortic valve is tricuspid. Aortic valve regurgitation is not visualized. No aortic stenosis is present. Aortic valve mean gradient measures 4.0 mmHg. Aortic valve peak gradient measures 8.4 mmHg. Aortic valve area, by VTI measures 2.48 cm. Pulmonic Valve: The pulmonic valve was normal in structure. Pulmonic valve regurgitation is not visualized. No evidence of pulmonic stenosis. Aorta: The aortic root is normal in size and structure. Venous: The inferior vena cava is normal in size with greater than 50% respiratory variability, suggesting right atrial pressure of 3 mmHg. IAS/Shunts: No atrial level shunt detected by color flow Doppler.  LEFT VENTRICLE PLAX 2D LVIDd:  5.10 cm  Diastology LVIDs:         3.10 cm  LV e' lateral:   15.20 cm/s LV PW:         1.30 cm  LV E/e' lateral: 4.8 LV IVS:        1.10 cm  LV e' medial:    10.70 cm/s LVOT diam:     2.10 cm  LV E/e'  medial:  6.9 LV SV:         70 LV SV Index:   34 LVOT Area:     3.46 cm                          3D Volume EF:                         3D EF:        64 %                         LV EDV:       147 ml                         LV ESV:       53 ml                         LV SV:        94 ml RIGHT VENTRICLE             IVC RV Basal diam:  3.10 cm     IVC diam: 1.50 cm RV S prime:     15.40 cm/s TAPSE (M-mode): 2.0 cm LEFT ATRIUM             Index       RIGHT ATRIUM           Index LA diam:        3.20 cm 1.54 cm/m  RA Area:     16.10 cm LA Vol (A2C):   72.1 ml 34.59 ml/m RA Volume:   41.50 ml  19.91 ml/m LA Vol (A4C):   50.4 ml 24.18 ml/m LA Biplane Vol: 64.1 ml 30.75 ml/m  AORTIC VALVE AV Area (Vmax):    2.72 cm AV Area (Vmean):   2.53 cm AV Area (VTI):     2.48 cm AV Vmax:           145.00 cm/s AV Vmean:          98.100 cm/s AV VTI:            0.284 m AV Peak Grad:      8.4 mmHg AV Mean Grad:      4.0 mmHg LVOT Vmax:         114.00 cm/s LVOT Vmean:        71.600 cm/s LVOT VTI:          0.203 m LVOT/AV VTI ratio: 0.71  AORTA Ao Root diam: 3.50 cm Ao Asc diam:  2.80 cm MITRAL VALVE               TRICUSPID VALVE MV Area (PHT): 3.91 cm    TR Peak grad:   10.1 mmHg MV Peak grad:  3.7 mmHg    TR Vmax:        159.00 cm/s MV Mean grad:  1.0 mmHg MV  Vmax:       0.97 m/s    SHUNTS MV Vmean:      48.7 cm/s   Systemic VTI:  0.20 m MV Decel Time: 194 msec    Systemic Diam: 2.10 cm MV E velocity: 73.50 cm/s MV A velocity: 36.40 cm/s MV E/A ratio:  2.02 Jenkins Rouge MD Electronically signed by Jenkins Rouge MD Signature Date/Time: 01/29/2020/3:19:48 PM    Final    VAS [place] LOWER EXTREMITY VENOUS (DVT)  Result Date: 01/29/2020  Lower Venous DVTStudy Indications: Anasarca.  Risk Factors: Acute kidney injury. Limitations: Significant pitting edema and pain, Comparison Study: No prior study on file for comparison Performing Technologist: Sharion Dove RVS  Examination Guidelines: A complete evaluation includes B-mode imaging,  spectral Doppler, color Doppler, and power Doppler as needed of all accessible portions of each vessel. Bilateral testing is considered an integral part of a complete examination. Limited examinations for reoccurring indications may be performed as noted. The reflux portion of the exam is performed with the patient in reverse Trendelenburg.  +---------+---------------+---------+-----------+----------+-------------------+ RIGHT    CompressibilityPhasicitySpontaneityPropertiesThrombus Aging      +---------+---------------+---------+-----------+----------+-------------------+ CFV                     Yes      Yes                  patent by color and                                                       Doppler             +---------+---------------+---------+-----------+----------+-------------------+ FV Prox  Full                                                             +---------+---------------+---------+-----------+----------+-------------------+ FV Mid   Full                                                             +---------+---------------+---------+-----------+----------+-------------------+ FV Distal                                             patent by color and                                                       Doppler             +---------+---------------+---------+-----------+----------+-------------------+ POP      Full           Yes      Yes                                      +---------+---------------+---------+-----------+----------+-------------------+  PTV                                                   patent by color     +---------+---------------+---------+-----------+----------+-------------------+ PERO                                                  patent by color and                                                       Doppler              +---------+---------------+---------+-----------+----------+-------------------+   +---------+---------------+---------+-----------+----------+-------------------+ LEFT     CompressibilityPhasicitySpontaneityPropertiesThrombus Aging      +---------+---------------+---------+-----------+----------+-------------------+ CFV      Full           Yes      Yes                                      +---------+---------------+---------+-----------+----------+-------------------+ FV Prox  Full           Yes      Yes                                      +---------+---------------+---------+-----------+----------+-------------------+ FV Mid   Full                                                             +---------+---------------+---------+-----------+----------+-------------------+ FV Distal                                             patent by color and                                                       Doppler             +---------+---------------+---------+-----------+----------+-------------------+ PFV                                                   patent by color     +---------+---------------+---------+-----------+----------+-------------------+ POP      Full           Yes      Yes                                      +---------+---------------+---------+-----------+----------+-------------------+  PTV      Full                                                             +---------+---------------+---------+-----------+----------+-------------------+ PERO     Full                                                             +---------+---------------+---------+-----------+----------+-------------------+     Summary: RIGHT: - There is no evidence of deep vein thrombosis in the lower extremity. However, portions of this examination were limited- see technologist comments above.  interstitial fluid noted throughout  LEFT: - There is no evidence of  deep vein thrombosis in the lower extremity. However, portions of this examination were limited- see technologist comments above.  Interstitial fluid noted throughout.  *See table(s) above for measurements and observations. Electronically signed by Harold Barban MD on 01/29/2020 at 6:01:58 PM.    Final    [place] EKG SITE RITE  Result Date: 01/30/2020 If Site Rite image not attached, placement could not be confirmed due to current cardiac rhythm.  IR KIDNEY BIOPSY  Result Date: 01/31/2020 INDICATION: Acute on chronic nephrotic syndrome. Patient presents today for ultrasound-guided random renal biopsy for tissue diagnostic purposes. Of note, the patient underwent ultrasound-guided biopsy of the right kidney on 03/22/2020. Poor venous access. In need of durable intravenous access while admitted to the hospital. EXAM: 1. ULTRASOUND AND FLUOROSCOPIC GUIDED NON TUNNELED CENTRAL VENOUS CATHETER INSERTION 2. ULTRASOUND-GUIDED BIOPSY OF THE INFERIOR POLE OF THE LEFT KIDNEY. COMPARISON:  Ultrasound-guided random renal biopsy-03/23/2019; CT abdomen pelvis-08/27/2017 MEDICATIONS: None ANESTHESIA/SEDATION: Moderate (conscious) sedation was employed during this procedure. A total of Versed 2 mg and Fentanyl 75 mcg was administered intravenously. Moderate Sedation Time: 33 minutes. The patient's level of consciousness and vital signs were monitored continuously by radiology nursing throughout the procedure under my direct supervision. FLUOROSCOPY TIME:  6 seconds (2 mGy) COMPLICATIONS: SIR Level A - No therapy, no consequence. Procedure complicated by development of a tiny asymptomatic left-sided perinephric hematoma. PROCEDURE: Informed written consent was obtained from the patient after a discussion of the risks, benefits and alternatives to treatment. The patient understands and consents the procedure. A timeout was performed prior to the initiation of the procedure. The procedures, risks, benefits, and alternatives were  explained to the patient and informed written consent was obtained. A timeout was performed prior to the initiation of the procedure. The right neck and upper chest was prepped with chlorhexidine in a sterile fashion, and a sterile drape was applied covering the operative field. Maximum barrier sterile technique with sterile gowns and gloves were used for the procedure. A timeout was performed prior to the initiation of the procedure. Local anesthesia was provided with 1% lidocaine. Under direct ultrasound guidance, the right internal jugular vein was accessed with a micropuncture kit after the overlying soft tissues were anesthetized with 1% lidocaine. After the overlying soft tissues were anesthetized, a small venotomy incision was created and a micropuncture kit was utilized to access the right right internal jugular vein. Real-time ultrasound guidance was utilized for vascular access including the acquisition  of a permanent ultrasound image documenting patency of the accessed vessel. A guidewire was advanced to the level of the superior caval-atrial junction for measurement purposes and the PICC line was cut to length. A peel-away sheath was placed and a 18 cm, 5 [place], dual lumen was inserted to level of the superior caval-atrial junction. A post procedure spot fluoroscopic was obtained. The catheter easily aspirated and flushed and was secured in place with stat lock device. A dressing was applied. _________________________________________________________ Attention was now paid towards the ultrasound-guided random renal biopsy. Ultrasound scanning was performed of the bilateral flanks. The inferior pole of the left kidney was selected for biopsy due to location and sonographic window. Additionally, patient had previously undergone ultrasound-guided biopsy of the inferior pole the right kidney. The procedure was planned. The operative site was prepped and draped in the usual sterile fashion. The overlying soft  tissues were anesthetized with 1% lidocaine with epinephrine. A 17 gauge core needle biopsy device was advanced into the inferior cortex of the left kidney and 3 core biopsies were obtained under direct ultrasound guidance. Images were saved for documentation purposes. The biopsy device was removed and hemostasis was obtained with manual compression. Post procedural scanning was negative for significant post procedural hemorrhage or additional complication. A dressing was placed. The patient tolerated the procedure well without immediate post procedural complication. FINDINGS: After catheter placement, the tip lies within the superior cavoatrial junction. The catheter aspirates and flushes normally and is ready for immediate use. Sonographic evaluation confirms appropriate positioning of the biopsy needle within the inferior pole of the left kidney. Postprocedural imaging demonstrates development of a tiny asymptomatic left-sided perinephric hematoma (image 8), not found to enlarge on delayed sonographic evaluation. IMPRESSION: 1. Technically successful ultrasound-guided biopsy of the inferior pole of the left kidney. Procedure complicated by development of a tiny asymptomatic left-sided perinephric hematoma. 2. Successful ultrasound and fluoroscopic guided placement of a right internal jugular vein approach, 18 cm, 5 [place], dual lumen non tunneled central venous catheter with tip at the superior caval-atrial junction. The PICC line is ready for immediate use. Electronically Signed   By: Sandi Mariscal M.D.   On: 01/31/2020 11:34       Subjective: Patient seen and examined at bedside.  He feels better and thinks that he is ready to go home today.  Denies worsening shortness of breath, chest pain, fever or vomiting.  Discharge Exam: Vitals:   02/02/20 0030 02/02/20 0454  BP: (!) 135/95 131/85  Pulse: 63 61  Resp: 18 18  Temp:  98.1 F (36.7 C)  SpO2: 99% 100%    General: Pt is alert, awake, not in  acute distress Cardiovascular: rate controlled, S1/S2 + Respiratory: bilateral decreased breath sounds at bases Abdominal: Soft, NT, ND, bowel sounds + Extremities: Bilateral lower extremity edema present; no cyanosis    The results of significant diagnostics from this hospitalization (including imaging, microbiology, ancillary and laboratory) are listed below for reference.     Microbiology: Recent Results (from the past 240 hour(s))  SARS Coronavirus 2 by RT PCR (hospital order, performed in [facility] hospital lab) Nasopharyngeal Nasopharyngeal Swab     Status: None   Collection Time: 01/28/20  4:49 AM   Specimen: Nasopharyngeal Swab  Result Value Ref Range Status   SARS Coronavirus 2 NEGATIVE NEGATIVE Final    Comment: (NOTE) SARS-CoV-2 target nucleic acids are NOT DETECTED.  The SARS-CoV-2 RNA is generally detectable in upper and lower respiratory specimens during the acute phase  of infection. The lowest concentration of SARS-CoV-2 viral copies this assay can detect is 250 copies / mL. A negative result does not preclude SARS-CoV-2 infection and should not be used as the sole basis for treatment or other patient management decisions.  A negative result may occur with improper specimen collection / handling, submission of specimen other than nasopharyngeal swab, presence of viral mutation(s) within the areas targeted by this assay, and inadequate number of viral copies (<250 copies / mL). A negative result must be combined with clinical observations, patient history, and epidemiological information.  Fact Sheet for Patients:   StrictlyIdeas.no  Fact Sheet for Healthcare Providers: BankingDealers.co.za  This test is not yet approved or  cleared by the [place] FDA and has been authorized for detection and/or diagnosis of SARS-CoV-2 by FDA under an Emergency Use Authorization (EUA).  This EUA will remain in effect  (meaning this test can be used) for the duration of the COVID-19 declaration under Section 564(b)(1) of the Act, 21 U.S.C. section 360bbb-3(b)(1), unless the authorization is terminated or revoked sooner.  Performed at [facility], [place] [address] [place], [place] [address]   Culture, Urine     Status: None   Collection Time: 01/29/20 11:46 AM   Specimen: Urine, Random  Result Value Ref Range Status   Specimen Description URINE, RANDOM  Final   Special Requests NONE  Final   Culture   Final    NO GROWTH Performed at [facility], [place] [address] [place], [place] [address]    Report Status 01/30/2020 FINAL  Final  Culture, blood (routine x 2)     Status: None (Preliminary result)   Collection Time: 01/30/20  6:50 PM   Specimen: BLOOD  Result Value Ref Range Status   Specimen Description BLOOD RIGHT ANTECUBITAL  Final   Special Requests   Final    BOTTLES DRAWN AEROBIC AND ANAEROBIC Blood Culture results may not be optimal due to an inadequate volume of blood received in culture bottles   Culture   Final    NO GROWTH 3 DAYS Performed at [facility], [place] [address], [place], [place] [address]    Report Status PENDING  Incomplete  Culture, blood (routine x 2)     Status: None (Preliminary result)   Collection Time: 01/30/20  7:42 PM   Specimen: BLOOD  Result Value Ref Range Status   Specimen Description BLOOD RIGHT ANTECUBITAL  Final   Special Requests   Final    BOTTLES DRAWN AEROBIC AND ANAEROBIC Blood Culture adequate volume   Culture   Final    NO GROWTH 3 DAYS Performed at [facility], [place] [address] [place], [place] [address]    Report Status PENDING  Incomplete     Labs: BNP (last 3 results) No results for input(s): BNP in the last 8760 hours. Basic Metabolic Panel: Recent Labs  Lab 01/27/20 2028 01/27/20 2028 01/28/20 0513 01/29/20 0333 01/30/20 0218 02/01/20 0422 02/02/20 0256  NA 140  --   --  140 138 139 137  K 4.8  --   --   5.2* 4.6 3.7 3.7  CL 109  --   --  109 108 104 100  CO2 27  --   --  '25 23 27 29  ' GLUCOSE 103*  --   --  140* 137* 109* 113*  BUN 45*  --   --  43* 47* 35* 27*  CREATININE 2.34*   < >  2.25* 2.38* 2.42* 1.43* 1.28*  CALCIUM 7.2*  --   --  7.7* 7.7* 7.6* 8.1*  MG  --   --   --  1.9 1.7 1.6* 1.5*  PHOS  --   --   --  4.6 4.9* 5.6* 5.5*   < > = values in this interval not displayed.   Liver Function Tests: Recent Labs  Lab 01/27/20 2028 01/29/20 0333 01/30/20 0218 02/01/20 0422 02/02/20 0256  AST 16 13* 13* 16 17  ALT '9 7 7 10 11  ' ALKPHOS 53 43 38 42 39  BILITOT 0.5 0.2* 0.4 0.1* 0.7  PROT 3.3* 3.9* 3.2* 3.4* 3.8*  ALBUMIN <1.0* 1.3* 1.0* 1.2* 1.6*   No results for input(s): LIPASE, AMYLASE in the last 168 hours. No results for input(s): AMMONIA in the last 168 hours. CBC: Recent Labs  Lab 01/27/20 2028 01/27/20 2028 01/28/20 0513 01/29/20 0333 01/30/20 0218 02/01/20 0422 02/02/20 0256  WBC 7.4   < > 7.8 6.0 17.9* 15.2* 14.8*  NEUTROABS 3.9  --   --  5.2 14.4* 10.2* 10.8*  HGB 11.8*   < > 11.5* 11.1* 10.8* 11.3* 11.5*  HCT 34.4*   < > 33.8* 31.6* 29.8* 32.8* 33.3*  MCV 85.8   < > 86.0 84.3 82.8 83.2 83.9  PLT 373   < > 347 371 423* 441* 418*   < > = values in this interval not displayed.   Cardiac Enzymes: No results for input(s): CKTOTAL, CKMB, CKMBINDEX, TROPONINI in the last 168 hours. BNP: Invalid input(s): POCBNP CBG: No results for input(s): GLUCAP in the last 168 hours. D-Dimer No results for input(s): DDIMER in the last 72 hours. Hgb A1c No results for input(s): HGBA1C in the last 72 hours. Lipid Profile No results for input(s): CHOL, HDL, LDLCALC, TRIG, CHOLHDL, LDLDIRECT in the last 72 hours. Thyroid function studies No results for input(s): TSH, T4TOTAL, T3FREE, THYROIDAB in the last 72 hours.  Invalid input(s): FREET3 Anemia work up No results for input(s): VITAMINB12, FOLATE, FERRITIN, TIBC, IRON, RETICCTPCT in the last 72 hours. Urinalysis     Component Value Date/Time   COLORURINE YELLOW 01/28/2020 1610   APPEARANCEUR HAZY (A) 01/28/2020 1610   LABSPEC 1.010 01/28/2020 1610   PHURINE 5.0 01/28/2020 1610   GLUCOSEU NEGATIVE 01/28/2020 1610   HGBUR MODERATE (A) 01/28/2020 1610   BILIRUBINUR NEGATIVE 01/28/2020 1610   KETONESUR NEGATIVE 01/28/2020 1610   PROTEINUR >=300 (A) 01/28/2020 1610   UROBILINOGEN 1.0 03/02/2019 1944   NITRITE NEGATIVE 01/28/2020 1610   LEUKOCYTESUR NEGATIVE 01/28/2020 1610   Sepsis Labs Invalid input(s): PROCALCITONIN,  WBC,  LACTICIDVEN Microbiology Recent Results (from the past 240 hour(s))  SARS Coronavirus 2 by RT PCR (hospital order, performed in [place] hospital lab) Nasopharyngeal Nasopharyngeal Swab     Status: None   Collection Time: 01/28/20  4:49 AM   Specimen: Nasopharyngeal Swab  Result Value Ref Range Status   SARS Coronavirus 2 NEGATIVE NEGATIVE Final    Comment: (NOTE) SARS-CoV-2 target nucleic acids are NOT DETECTED.  The SARS-CoV-2 RNA is generally detectable in upper and lower respiratory specimens during the acute phase of infection. The lowest concentration of SARS-CoV-2 viral copies this assay can detect is 250 copies / mL. A negative result does not preclude SARS-CoV-2 infection and should not be used as the sole basis for treatment or other patient management decisions.  A negative result may occur with improper specimen collection / handling, submission of specimen other than nasopharyngeal swab, presence  of viral mutation(s) within the areas targeted by this assay, and inadequate number of viral copies (<250 copies / mL). A negative result must be combined with clinical observations, patient history, and epidemiological information.  Fact Sheet for Patients:   StrictlyIdeas.no  Fact Sheet for Healthcare Providers: BankingDealers.co.za  This test is not yet approved or  cleared by the [place] FDA and has  been authorized for detection and/or diagnosis of SARS-CoV-2 by FDA under an Emergency Use Authorization (EUA).  This EUA will remain in effect (meaning this test can be used) for the duration of the COVID-19 declaration under Section 564(b)(1) of the Act, 21 U.S.C. section 360bbb-3(b)(1), unless the authorization is terminated or revoked sooner.  Performed at [facility], [place] [address] [place], [place] [address]   Culture, Urine     Status: None   Collection Time: 01/29/20 11:46 AM   Specimen: Urine, Random  Result Value Ref Range Status   Specimen Description URINE, RANDOM  Final   Special Requests NONE  Final   Culture   Final    NO GROWTH Performed at [facility], [place] [address], [place], [place] [address]    Report Status 01/30/2020 FINAL  Final  Culture, blood (routine x 2)     Status: None (Preliminary result)   Collection Time: 01/30/20  6:50 PM   Specimen: BLOOD  Result Value Ref Range Status   Specimen Description BLOOD RIGHT ANTECUBITAL  Final   Special Requests   Final    BOTTLES DRAWN AEROBIC AND ANAEROBIC Blood Culture results may not be optimal due to an inadequate volume of blood received in culture bottles   Culture   Final    NO GROWTH 3 DAYS Performed at [facility], [place] [address], [place], [place] [address]    Report Status PENDING  Incomplete  Culture, blood (routine x 2)     Status: None (Preliminary result)   Collection Time: 01/30/20  7:42 PM   Specimen: BLOOD  Result Value Ref Range Status   Specimen Description BLOOD RIGHT ANTECUBITAL  Final   Special Requests   Final    BOTTLES DRAWN AEROBIC AND ANAEROBIC Blood Culture adequate volume   Culture   Final    NO GROWTH 3 DAYS Performed at [facility], [place] [address], [place],  [address]    Report Status PENDING  Incomplete     Time coordinating discharge: 35 minutes  SIGNED:   Aline August, MD  [facility] 02/02/2020, 10:35 AM

## 2020-02-02 NOTE — Progress Notes (Signed)
Patient ID: Philip Trevino, male   DOB: [birth year], 24 y.o.   MRN: [id number] S:   I/O 1.4 / 4.7 yesterday. Feels abd less full and legs improved but still some swelling.  No orthopnea or chest pressure..  No new complaints.  O:BP 131/85 (BP Location: Right Arm)   Pulse 61   Temp 98.1 F (36.7 C) (Oral)   Resp 18   Ht 5\' 10"  (1.778 m)   Wt 80.7 kg   SpO2 100%   BMI 25.54 kg/m   Intake/Output Summary (Last 24 hours) at 02/02/2020 1014 Last data filed at 02/02/2020 0700 Gross per 24 hour  Intake 1208.6 ml  Output 4600 ml  Net -3391.4 ml   Intake/Output: I/O last 3 completed shifts: In: 2201.7 [P.O.:1910; I.V.:117.7; IV Piggyback:174] Out: 6280 [Urine:6280]  Intake/Output this shift:  No intake/output data recorded. Weight change:  Gen: NAD CVS: no rub Resp: cta Abd: +BS, soft, NT/ND Ext: 2+ edema from knees down  Recent Labs  Lab 01/27/20 2028 01/28/20 0513 01/29/20 0333 01/30/20 0218 02/01/20 0422 02/02/20 0256  NA 140  --  140 138 139 137  K 4.8  --  5.2* 4.6 3.7 3.7  CL 109  --  109 108 104 100  CO2 27  --  25 23 27 29   GLUCOSE 103*  --  140* 137* 109* 113*  BUN 45*  --  43* 47* 35* 27*  CREATININE 2.34* 2.25* 2.38* 2.42* 1.43* 1.28*  ALBUMIN <1.0*  --  1.3* 1.0* 1.2* 1.6*  CALCIUM 7.2*  --  7.7* 7.7* 7.6* 8.1*  PHOS  --   --  4.6 4.9* 5.6* 5.5*  AST 16  --  13* 13* 16 17  ALT 9  --  7 7 10 11    Liver Function Tests: Recent Labs  Lab 01/30/20 0218 02/01/20 0422 02/02/20 0256  AST 13* 16 17  ALT 7 10 11   ALKPHOS 38 42 39  BILITOT 0.4 0.1* 0.7  PROT 3.2* 3.4* 3.8*  ALBUMIN 1.0* 1.2* 1.6*   No results for input(s): LIPASE, AMYLASE in the last 168 hours. No results for input(s): AMMONIA in the last 168 hours. CBC: Recent Labs  Lab 01/28/20 0513 01/28/20 0513 01/29/20 0333 01/29/20 0333 01/30/20 0218 02/01/20 0422 02/02/20 0256  WBC 7.8   < > 6.0   < > 17.9* 15.2* 14.8*  NEUTROABS  --   --  5.2   < > 14.4* 10.2* 10.8*  HGB 11.5*   < > 11.1*   <  > 10.8* 11.3* 11.5*  HCT 33.8*   < > 31.6*   < > 29.8* 32.8* 33.3*  MCV 86.0  --  84.3  --  82.8 83.2 83.9  PLT 347   < > 371   < > 423* 441* 418*   < > = values in this interval not displayed.   Cardiac Enzymes: No results for input(s): CKTOTAL, CKMB, CKMBINDEX, TROPONINI in the last 168 hours. CBG: No results for input(s): GLUCAP in the last 168 hours.  Iron Studies:  No results for input(s): IRON, TIBC, TRANSFERRIN, FERRITIN in the last 72 hours. Studies/Results: No results found. Marland Kitchen amLODipine  10 mg Oral Daily  . aspirin  81 mg Oral Daily  . Chlorhexidine Gluconate Cloth  6 each Topical Daily  . heparin  5,000 Units Subcutaneous Q8H  . hydrALAZINE  10 mg Intravenous Q6H  . pantoprazole  40 mg Oral Daily  . predniSONE  60 mg Oral Q breakfast  .  sodium chloride flush  10-40 mL Intracatheter Q12H  . sulfamethoxazole-trimethoprim  1 tablet Oral Daily  . [START ON 02/03/2020] torsemide  100 mg Oral Daily    BMET    Component Value Date/Time   NA 137 02/02/2020 0256   K 3.7 02/02/2020 0256   CL 100 02/02/2020 0256   CO2 29 02/02/2020 0256   GLUCOSE 113 (H) 02/02/2020 0256   BUN 27 (H) 02/02/2020 0256   CREATININE 1.28 (H) 02/02/2020 0256   CALCIUM 8.1 (L) 02/02/2020 0256   GFRNONAA >60 02/02/2020 0256   GFRAA >60 02/02/2020 0256   CBC    Component Value Date/Time   WBC 14.8 (H) 02/02/2020 0256   RBC 3.97 (L) 02/02/2020 0256   HGB 11.5 (L) 02/02/2020 0256   HCT 33.3 (L) 02/02/2020 0256   PLT 418 (H) 02/02/2020 0256   MCV 83.9 02/02/2020 0256   MCH 29.0 02/02/2020 0256   MCHC 34.5 02/02/2020 0256   RDW 11.9 02/02/2020 0256   LYMPHSABS 2.5 02/02/2020 0256   MONOABS 1.4 (H) 02/02/2020 0256   EOSABS 0.0 02/02/2020 0256   BASOSABS 0.0 02/02/2020 0256   Assessment/Plan: 1. 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 D/c IV lasix and change to torsemide 100 daily - discussed at length with him over and under hydration and  when to call clinic or hold diuretic.  2. Resumed prednisone 60 mg daily 01/28/20 - cont x 4 weeks then outpt f/u to determine taper 3.  PPI for GI prophylaxis with steroids 4. Bactrim SS daily for PJP propylaxis 5. ASA 81 daily for DVT prophylaxis in setting of hypoalbuminemia/nephrotic syndrome = due to h/o noncompliance I don't feel safe giving him full anticoag with coumadin or DOAC 2. AKI- presumably due to ischemic ATN in setting of nephrotic syndrome and anasarca. Improving 3. Anemia - new since March. Iron level ok and not microcytic. No indication for ESA. CTM 4. HTN- improving with IV diuresis. 5. Dispo - D/c today with clinic f/u 3 weeks with Dr. Hollie Salk - discussed with her.  Pt aware of importance of f/u.  Jannifer Hick MD [facility] Pager [phone]

## 2020-02-02 NOTE — Care Management (Signed)
Entered in [facility] with no co pay. TOC will bring medications to room.   Magdalen Spatz RN

## 2020-02-03 ENCOUNTER — Telehealth: Payer: Self-pay

## 2020-02-03 NOTE — Telephone Encounter (Signed)
Transition Care Management Follow-up Telephone Call Date of discharge and from where: 02/02/2020, [facility]   Call placed to patient # [phone], message left with call back requested to this CM # [phone]  He has an appointment at [facility] 02/10/2020 and the phone number for [facility] is on his AVS

## 2020-02-04 ENCOUNTER — Telehealth: Payer: Self-pay

## 2020-02-04 LAB — CULTURE, BLOOD (ROUTINE X 2)
Culture: NO GROWTH
Culture: NO GROWTH
Special Requests: ADEQUATE

## 2020-02-04 NOTE — Telephone Encounter (Signed)
  Transition Care Management Follow-up Telephone Call Date of discharge and from where: 02/02/2020, [facility]  How have you been since you were released from the hospital? Feeling better  Any questions or concerns? None  Items Reviewed: Did the pt receive and understand the discharge instructions provided? YES Medications obtained and verified? YES  Any new allergies since your discharge? NONE Dietary orders reviewed? Yes  Do you have support at home? Some Family    Functional Questionnaire: (I = Independent and D = Dependent) ADLs: I   Follow up appointments reviewed:  PCP Hospital f/u appt confirmed?  Scheduled to see NP Juluis Mire on  07/22//2021 at 03:10 pm   [facility] f/u appt confirmed?not yet scheduled stated he is followed by [facility] Are transportation arrangements needed? NO  If their condition worsens, /is the pt aware to call PCP or go to the Emergency Dept.?  Pt is aware if condition is worsening or start experiencing any of diff breathing, SOB, chest pain, extreme fatigue,  Persistent nausea and vomiting, bleeding , rapid weight gain, severe uncontrolled pain, or visual disturbances to return to ED  Was the patient provided with contact information for the PCP's office or ED? YES given.  Was to pt encouraged to call back with questions or concerns?YES name and contact information .  DME None  Reinforced instructions  to leave dressing x 24H, then may remove and shower as usual. Do not submerge until completely healed. Stressed the importance to schedule a F /U appt with his kidney specialist. Verbalized understanding

## 2020-02-08 ENCOUNTER — Encounter (HOSPITAL_COMMUNITY): Payer: Self-pay | Admitting: Internal Medicine

## 2020-02-08 LAB — SURGICAL PATHOLOGY

## 2020-02-10 ENCOUNTER — Encounter (INDEPENDENT_AMBULATORY_CARE_PROVIDER_SITE_OTHER): Payer: Self-pay | Admitting: Primary Care

## 2020-02-10 ENCOUNTER — Ambulatory Visit (INDEPENDENT_AMBULATORY_CARE_PROVIDER_SITE_OTHER): Payer: Self-pay | Admitting: Primary Care

## 2020-02-10 ENCOUNTER — Other Ambulatory Visit: Payer: Self-pay

## 2020-02-10 VITALS — BP 113/82 | HR 108 | Temp 98.6°F | Ht 69.0 in | Wt 142.0 lb

## 2020-02-10 DIAGNOSIS — Z7689 Persons encountering health services in other specified circumstances: Secondary | ICD-10-CM

## 2020-02-10 DIAGNOSIS — Z09 Encounter for follow-up examination after completed treatment for conditions other than malignant neoplasm: Secondary | ICD-10-CM

## 2020-02-10 DIAGNOSIS — N179 Acute kidney failure, unspecified: Secondary | ICD-10-CM

## 2020-02-10 NOTE — Patient Instructions (Signed)
Nephrotic Syndrome Nephrotic syndrome is a set of findings that show there is a problem with the kidneys. These findings include:  High levels of protein in the urine (proteinuria).  High blood pressure (hypertension).  Low levels of the protein albumin in the blood (hypoalbuminemia).  High levels of cholesterol (hyperlipidemia) and triglycerides (hypertriglyceridemia) in the blood.  Swelling (edema) of the face, abdomen, arms, and legs. Nephrotic syndrome occurs when the kidneys' filters (glomeruli) are damaged. Glomeruli remove toxins and waste products from the bloodstream. As a result of damaged glomeruli, essential products such as proteins may also be removed from the bloodstream. Nephrotic syndrome results from the loss of proteins and other substances that the body needs. Nephrotic syndrome may increase your risk of further kidney damage and of health problems such as blood clots and infections. What are the causes? This condition may be caused by:  A kidney disease that damages the glomeruli, such as: ? Minimal change disease. ? Focal segmental glomerulosclerosis. ? Membranous nephropathy. ? Glomerulonephritis.  A condition or disease that affects other parts of the body (systemic), such as: ? Diabetes. ? Autoimmune diseases, such as lupus. ? Amyloidosis. ? Multiple myeloma. ? Some types of cancers. ? An infection, such as hepatitis C.  Certain medicines, such as: ? Nonsteroidal anti-inflammatory drugs (NSAIDs). ? Some anticancer drugs. In some cases, the cause may not be known. What are the signs or symptoms? Symptoms of this condition include:  Edema.  Foamy urine.  Unexplained weight gain.  Loss of appetite. In some cases, there are no noticeable symptoms. How is this diagnosed? This condition is usually diagnosed with a dipstick urine test followed by a 24-hour urine test in which you collect all of the urine that you produce over a 24-hour period. If your  test shows that you have this condition, more tests may be needed to find the cause. These may include blood, urine, imaging, or kidney biopsy tests. How is this treated? Treatment for this condition may include medicines to control symptoms or to prevent complications from occurring. These medicines may:  Decrease inflammation in the kidneys.  Lower blood pressure.  Lower cholesterol.  Reduce the blood's ability to clot.  Help control edema. Further treatment will depend on the cause of the condition. Your health care provider will discuss treatment options with you. Follow these instructions at home:  Follow diet instructions from your health care provider. This may include changes to help manage edema or hypertension, such as limiting your intake of salt or fluids.  Take over-the-counter and prescription medicines only as told by your health care provider. ? Do not take any new over-the-counter medicines or nutritional supplements unless approved by your health care provider. Many medicines can make this condition worse. Doses may need to be adjusted.  Keep all follow-up visits as told by your health care provider. This is important. Contact a health care provider if:  Your symptoms do not go away as expected or you develop new symptoms.  You continue to gain weight.  You have increased swelling of the feet, ankles, or legs. Get help right away if:  You stop producing urine.  You have prolonged bleeding.  You have shortness of breath.  You have a severe headache.  You have severe weakness. This information is not intended to replace advice given to you by your health care provider. Make sure you discuss any questions you have with your health care provider. Document Revised: 06/20/2017 Document Reviewed: 07/01/2016 Elsevier Patient Education  [place].

## 2020-02-10 NOTE — Progress Notes (Signed)
Assessment and Plan: [name] was seen today for hospitalization follow-up.  Diagnoses and all orders for this visit:  Acute renal failure, unspecified acute renal failure type [facility]) Hospital discharge note indicated labs to be drawn and to follow-up with nephrology -     CBC with Differential [facility] discharge follow-up Retrieved from hospital discharge no on 02/02/2020 1. Follow up with PCP in 1 week with repeat CBC/BMP- ordered  2. Outpatient follow-up with nephrology 3. Follow up in ED if symptoms worsen or new appear   Encounter to establish care Philip Mire, NP-C will be your  (PCP) she is mastered prepared . Able to diagnosed and treatment also  answer health concern as well as continuing care of varied medical conditions, not limited by cause, organ system, or diagnosis.     HPI Philip Trevino 24 y.o.male presents for follow up fromthe hospital. Admit date to the hospital was 01/27/20, patient was discharged from the hospital on patient wasadmitted for: Anasarca  with significant fluid in the abdomen lower extremity and facial puffiness.Acuterenal failure with history of nephrotic syndromeRight-sided pleuritic chest pain likely from fluid overload.Elevated blood pressure and  Normocytic normochromic anemia     Past Medical History:  Diagnosis Date  . Nephrotic syndrome      Allergies  Allergen Reactions  . Broccoli [Brassica Oleracea] Hives    CAULIFLOWER (allergic, also)      Current Outpatient Medications on File Prior to Visit  Medication Sig Dispense Refill  . amLODipine (NORVASC) 10 MG tablet Take 1 tablet (10 mg total) by mouth daily. 30 tablet 0  . aspirin 81 MG chewable tablet Chew 1 tablet (81 mg total) by mouth daily. 30 tablet 0  . pantoprazole (PROTONIX) 40 MG tablet Take 1 tablet (40 mg total) by mouth daily. 30 tablet 0  . predniSONE (DELTASONE) 20 MG tablet Take 3 tablets (60 mg total) by mouth daily with breakfast. 90 tablet  0  . sulfamethoxazole-trimethoprim (BACTRIM) 400-80 MG tablet Take 1 tablet by mouth daily. 30 tablet 0  . torsemide (DEMADEX) 100 MG tablet Take 1 tablet (100 mg total) by mouth daily. 30 tablet 0   No current facility-administered medications on file prior to visit.    ROS: all negative except above.   Physical Exam: Filed Weights   02/10/20 1520  Weight: 142 lb (64.4 kg)   BP 113/82 (BP Location: Left Arm, Patient Position: Sitting, Cuff Size: Normal)   Pulse (!) 108   Temp 98.6 F (37 C) (Oral)   Ht '5\' 9"'  (1.753 m)   Wt 142 lb (64.4 kg)   SpO2 96%   BMI 20.97 kg/m  General Appearance: Well nourished, in no apparent distress. Eyes: PERRLA, EOMs, conjunctiva no swelling or erythema Sinuses: No Frontal/maxillary tenderness ENT/Mouth: Ext aud canals clear, TMs without erythema, bulging. No erythema, swelling, or exudate on post pharynx.  Tonsils not swollen or erythematous. Hearing normal.  Neck: Supple, thyroid normal.  Respiratory: Respiratory effort normal, BS equal bilaterally without rales, rhonchi, wheezing or stridor.  Cardio: RRR with no MRGs. Brisk peripheral pulses without edema.  Abdomen: Soft, + BS.  Non tender, no guarding, rebound, hernias, masses. Lymphatics: Non tender without lymphadenopathy.  Musculoskeletal: Full ROM, 5/5 strength, normal gait.  Skin: Warm, dry without rashes, lesions, ecchymosis.  Neuro: Cranial nerves intact. Normal muscle tone, no cerebellar symptoms. Sensation intact.  Psych: Awake and oriented X 3, normal affect, Insight and Judgment appropriate.    Time spent  reviewing hospital records labs and [place], NP 3:52 PM

## 2020-02-11 LAB — CBC WITH DIFFERENTIAL/PLATELET
Basophils Absolute: 0.2 10*3/uL (ref 0.0–0.2)
Basos: 1 %
EOS (ABSOLUTE): 0.2 10*3/uL (ref 0.0–0.4)
Eos: 1 %
Hematocrit: 40.1 % (ref 37.5–51.0)
Hemoglobin: 13.7 g/dL (ref 13.0–17.7)
Immature Grans (Abs): 0.9 10*3/uL — ABNORMAL HIGH (ref 0.0–0.1)
Immature Granulocytes: 3 %
Lymphocytes Absolute: 6.4 10*3/uL — ABNORMAL HIGH (ref 0.7–3.1)
Lymphs: 23 %
MCH: 30 pg (ref 26.6–33.0)
MCHC: 34.2 g/dL (ref 31.5–35.7)
MCV: 88 fL (ref 79–97)
Monocytes Absolute: 2.2 10*3/uL — ABNORMAL HIGH (ref 0.1–0.9)
Monocytes: 8 %
Neutrophils Absolute: 17.9 10*3/uL — ABNORMAL HIGH (ref 1.4–7.0)
Neutrophils: 64 %
Platelets: 521 10*3/uL — ABNORMAL HIGH (ref 150–450)
RBC: 4.56 x10E6/uL (ref 4.14–5.80)
RDW: 14 % (ref 11.6–15.4)
WBC: 27.7 10*3/uL (ref 3.4–10.8)

## 2020-02-11 LAB — CMP14+EGFR
ALT: 40 IU/L (ref 0–44)
AST: 21 IU/L (ref 0–40)
Albumin/Globulin Ratio: 1.4 (ref 1.2–2.2)
Albumin: 3 g/dL — ABNORMAL LOW (ref 4.1–5.2)
Alkaline Phosphatase: 65 IU/L (ref 48–121)
BUN/Creatinine Ratio: 13 (ref 9–20)
BUN: 12 mg/dL (ref 6–20)
Bilirubin Total: <0.2 mg/dL (ref 0.0–1.2)
CO2: 27 mmol/L (ref 20–29)
Calcium: 8.6 mg/dL — ABNORMAL LOW (ref 8.7–10.2)
Chloride: 99 mmol/L (ref 96–106)
Creatinine, Ser: 0.95 mg/dL (ref 0.76–1.27)
GFR calc Af Amer: 130 mL/min/{1.73_m2} (ref >59)
GFR calc non Af Amer: 112 mL/min/{1.73_m2} (ref >59)
Globulin, Total: 2.1 g/dL (ref 1.5–4.5)
Glucose: 110 mg/dL — ABNORMAL HIGH (ref 65–99)
Potassium: 3.9 mmol/L (ref 3.5–5.2)
Sodium: 138 mmol/L (ref 134–144)
Total Protein: 5.1 g/dL — ABNORMAL LOW (ref 6.0–8.5)

## 2020-06-13 IMAGING — CR CHEST - 2 VIEW
2 series · 2 of 2 positions shown · non-contrast
Comparison: 05/19/2017

CLINICAL DATA: 4 day history of bilateral lower extremity swelling.

EXAM:
CHEST - 2 VIEW

[chest pa]
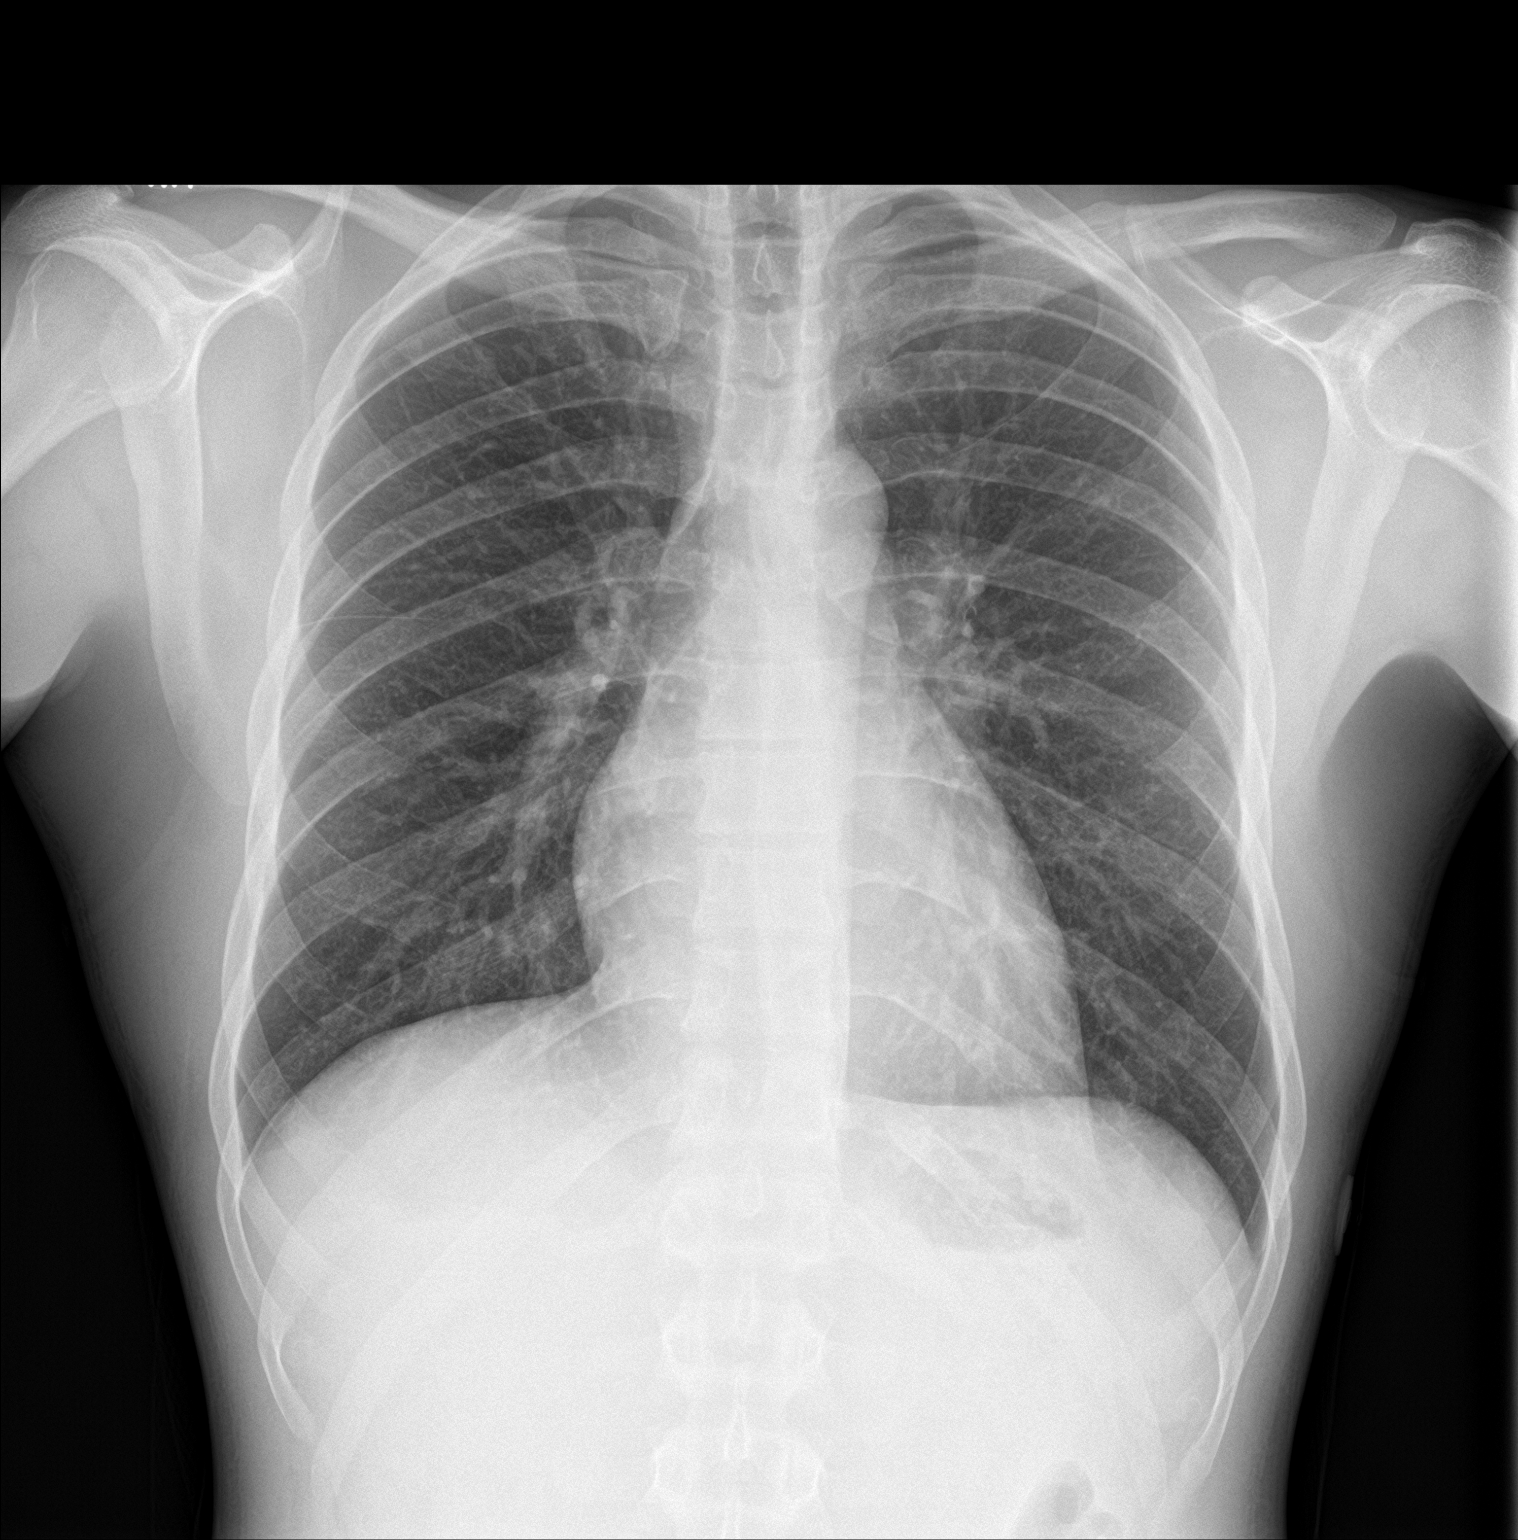

[chest lat]
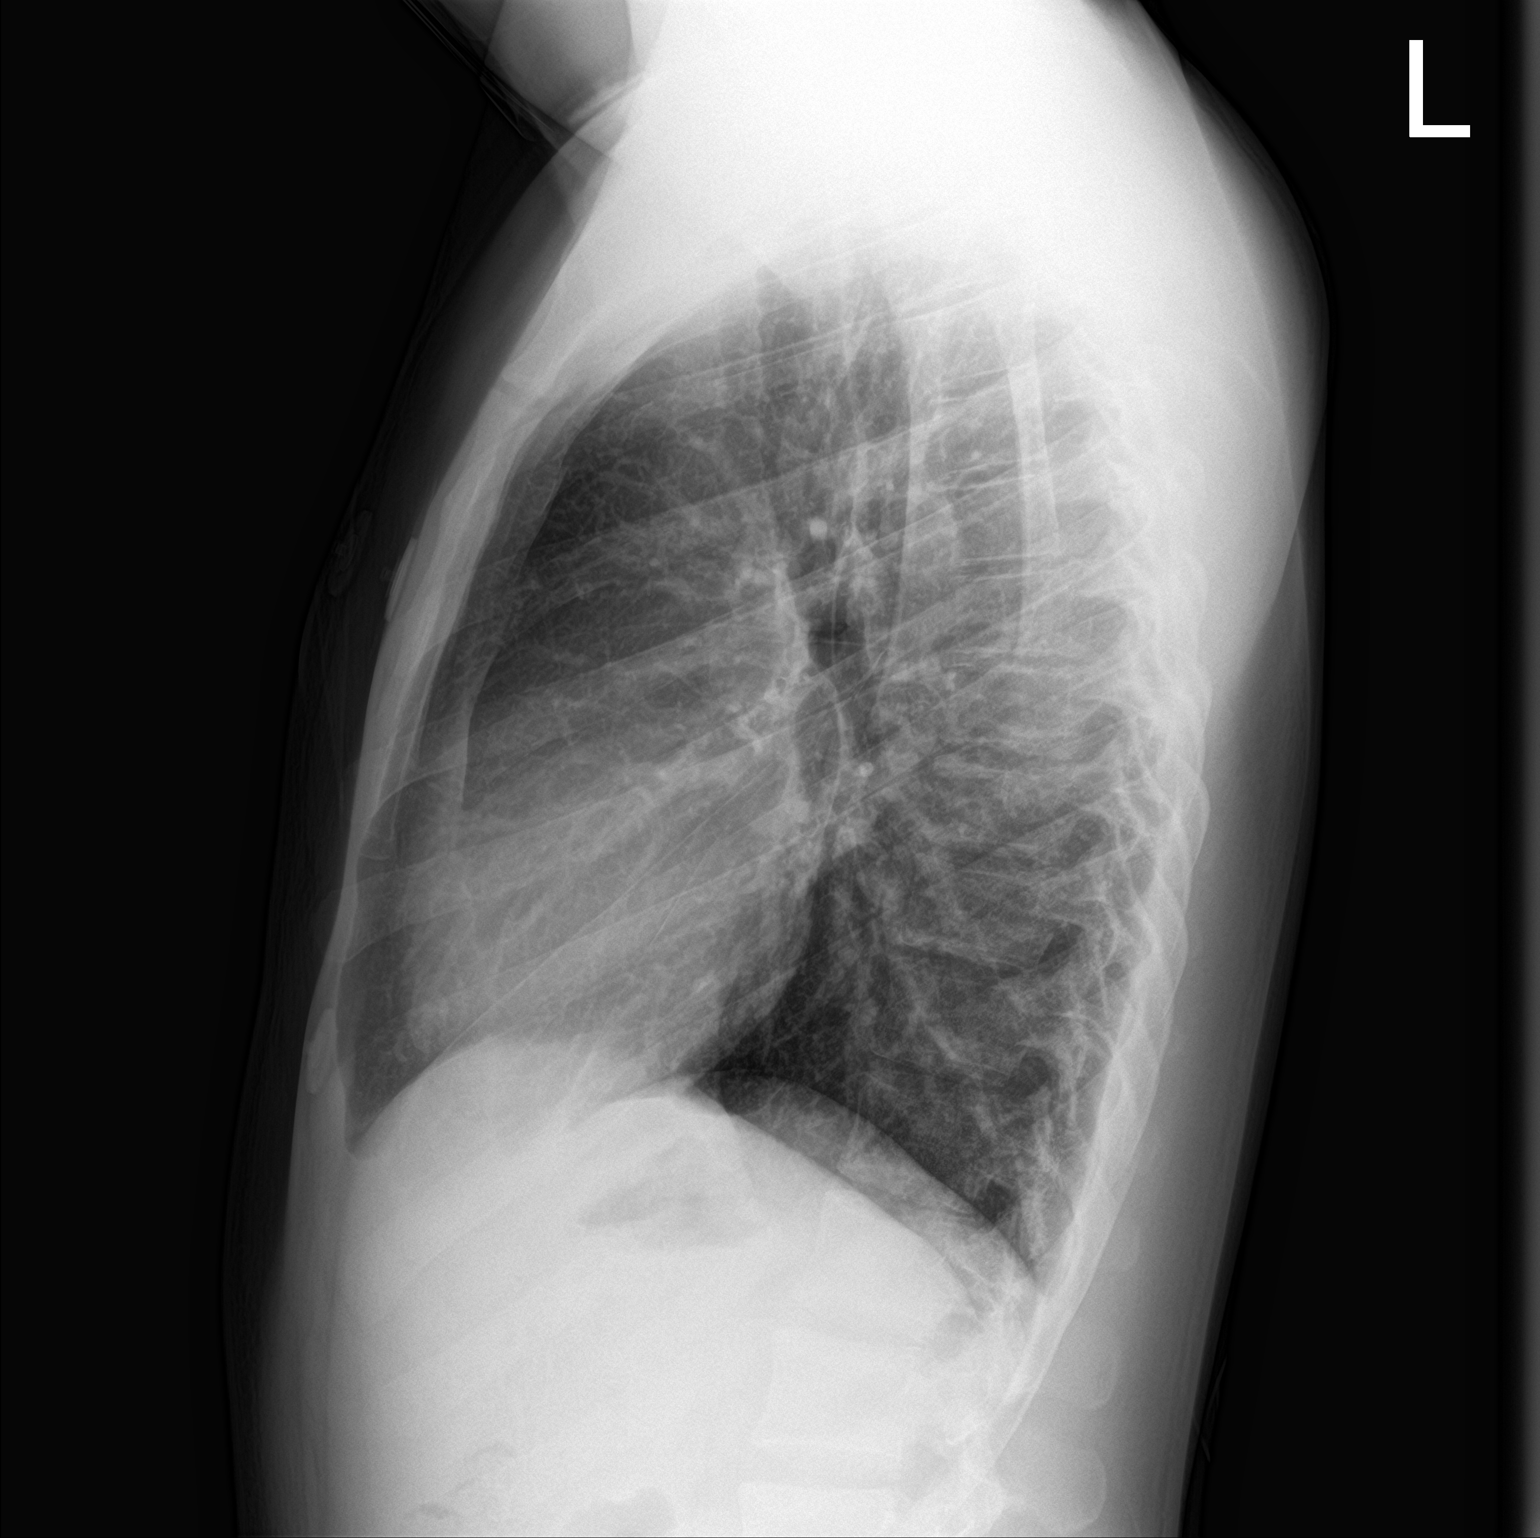

[2 of 2 positions shown; findings below may reference images not displayed]

FINDINGS: The heart size and mediastinal contours are within normal limits.
Both lungs are clear. The visualized skeletal structures are
unremarkable.
IMPRESSION: No active cardiopulmonary disease.

## 2020-08-14 ENCOUNTER — Ambulatory Visit (INDEPENDENT_AMBULATORY_CARE_PROVIDER_SITE_OTHER): Payer: Self-pay

## 2020-09-26 ENCOUNTER — Other Ambulatory Visit (INDEPENDENT_AMBULATORY_CARE_PROVIDER_SITE_OTHER): Payer: Self-pay | Admitting: General Practice

## 2020-09-26 NOTE — Telephone Encounter (Signed)
Requested medication (s) are due for refill today - yes  Requested medication (s) are on the active medication list -yes  Future visit scheduled -no  Last refill: 01/27/20  Notes to clinic: Patient is requesting medication prescribed by outside provider-sent for review of request  Requested Prescriptions  Pending Prescriptions Disp Refills   amLODipine (NORVASC) 10 MG tablet 30 tablet 0    Sig: Take 1 tablet (10 mg total) by mouth daily.      Cardiovascular:  Calcium Channel Blockers Failed - 09/26/2020 11:58 AM      Failed - Valid encounter within last 6 months    Recent Outpatient Visits           7 months ago Acute renal failure, unspecified acute renal failure type ([place])   [place], Benton P, NP   3 years ago Gynecomastia   [place] Clent Demark, PA-C                Passed - Last BP in normal range    BP Readings from Last 1 Encounters:  02/10/20 113/82            sulfamethoxazole-trimethoprim (BACTRIM) 400-80 MG tablet 30 tablet 0    Sig: Take 1 tablet by mouth daily.      Off-Protocol Failed - 09/26/2020 11:58 AM      Failed - Medication not assigned to a protocol, review manually.      Passed - Valid encounter within last 12 months    Recent Outpatient Visits           7 months ago Acute renal failure, unspecified acute renal failure type [facility])   [place] Kerin Perna, NP   3 years ago Gynecomastia   [place], Roger David, PA-C                  pantoprazole (PROTONIX) 40 MG tablet 30 tablet 0    Sig: Take 1 tablet (40 mg total) by mouth daily.      Gastroenterology: Proton Pump Inhibitors Passed - 09/26/2020 11:58 AM      Passed - Valid encounter within last 12 months    Recent Outpatient Visits           7 months ago Acute renal failure, unspecified acute renal failure type [facility])   [place] Kerin Perna, NP   3 years ago Gynecomastia   [place], Roger David, PA-C                  torsemide (DEMADEX) 100 MG tablet 30 tablet 0    Sig: Take 1 tablet (100 mg total) by mouth daily.      Cardiovascular:  Diuretics - Loop Failed - 09/26/2020 11:58 AM      Failed - Ca in normal range and within 360 days    Calcium  Date Value Ref Range Status  02/10/2020 8.6 (L) 8.7 - 10.2 mg/dL Final          Failed - Valid encounter within last 6 months    Recent Outpatient Visits           7 months ago Acute renal failure, unspecified acute renal failure type [facility])   [place], [place], NP   3 years ago Gynecomastia   [place], Roger David,  PA-C                Passed - K in normal range and within 360 days    Potassium  Date Value Ref Range Status  02/10/2020 3.9 3.5 - 5.2 mmol/L Final          Passed - Na in normal range and within 360 days    Sodium  Date Value Ref Range Status  02/10/2020 138 134 - 144 mmol/L Final          Passed - Cr in normal range and within 360 days    Creatinine, Ser  Date Value Ref Range Status  02/10/2020 0.95 0.76 - 1.27 mg/dL Final   Creatinine, Urine  Date Value Ref Range Status  01/28/2020 71.37 mg/dL Final    Comment:    Performed at [facility], [place] [address] [place], [place] [address]  01/28/2020 71.04 mg/dL Final          Passed - Last BP in normal range    BP Readings from Last 1 Encounters:  02/10/20 113/82            predniSONE (DELTASONE) 20 MG tablet 90 tablet 0    Sig: Take 3 tablets (60 mg total) by mouth daily with breakfast.      Not Delegated - Endocrinology:  Oral Corticosteroids Failed - 09/26/2020 11:58 AM      Failed - This refill cannot be delegated      Failed - Valid encounter within last 6 months    Recent Outpatient Visits           7 months ago Acute renal failure,  unspecified acute renal failure type ([place])   [place], [place] P, NP   3 years ago Gynecomastia   [place] Clent Demark, PA-C                Passed - Last BP in normal range    BP Readings from Last 1 Encounters:  02/10/20 113/82              Requested Prescriptions  Pending Prescriptions Disp Refills   amLODipine (NORVASC) 10 MG tablet 30 tablet 0    Sig: Take 1 tablet (10 mg total) by mouth daily.      Cardiovascular:  Calcium Channel Blockers Failed - 09/26/2020 11:58 AM      Failed - Valid encounter within last 6 months    Recent Outpatient Visits           7 months ago Acute renal failure, unspecified acute renal failure type ([place])   [place], Ruth P, NP   3 years ago Gynecomastia   [place] Clent Demark, PA-C                Passed - Last BP in normal range    BP Readings from Last 1 Encounters:  02/10/20 113/82            sulfamethoxazole-trimethoprim (BACTRIM) 400-80 MG tablet 30 tablet 0    Sig: Take 1 tablet by mouth daily.      Off-Protocol Failed - 09/26/2020 11:58 AM      Failed - Medication not assigned to a protocol, review manually.      Passed - Valid encounter within last 12 months    Recent Outpatient Visits           7  months ago Acute renal failure, unspecified acute renal failure type [facility])   [place] Kerin Perna, NP   3 years ago Gynecomastia   [place] Clent Demark, PA-C                  pantoprazole (PROTONIX) 40 MG tablet 30 tablet 0    Sig: Take 1 tablet (40 mg total) by mouth daily.      Gastroenterology: Proton Pump Inhibitors Passed - 09/26/2020 11:58 AM      Passed - Valid encounter within last 12 months    Recent Outpatient Visits           7 months ago Acute renal failure, unspecified acute renal failure  type [facility])   [place] Kerin Perna, NP   3 years ago Gynecomastia   [place], Roger David, PA-C                  torsemide (DEMADEX) 100 MG tablet 30 tablet 0    Sig: Take 1 tablet (100 mg total) by mouth daily.      Cardiovascular:  Diuretics - Loop Failed - 09/26/2020 11:58 AM      Failed - Ca in normal range and within 360 days    Calcium  Date Value Ref Range Status  02/10/2020 8.6 (L) 8.7 - 10.2 mg/dL Final          Failed - Valid encounter within last 6 months    Recent Outpatient Visits           7 months ago Acute renal failure, unspecified acute renal failure type ([place])   [place], [place], NP   3 years ago Gynecomastia   [place] Clent Demark, PA-C                Passed - K in normal range and within 360 days    Potassium  Date Value Ref Range Status  02/10/2020 3.9 3.5 - 5.2 mmol/L Final          Passed - Na in normal range and within 360 days    Sodium  Date Value Ref Range Status  02/10/2020 138 134 - 144 mmol/L Final          Passed - Cr in normal range and within 360 days    Creatinine, Ser  Date Value Ref Range Status  02/10/2020 0.95 0.76 - 1.27 mg/dL Final   Creatinine, Urine  Date Value Ref Range Status  01/28/2020 71.37 mg/dL Final    Comment:    Performed at [facility], [place] [address], [place], [place] [address]  01/28/2020 71.04 mg/dL Final          Passed - Last BP in normal range    BP Readings from Last 1 Encounters:  02/10/20 113/82            predniSONE (DELTASONE) 20 MG tablet 90 tablet 0    Sig: Take 3 tablets (60 mg total) by mouth daily with breakfast.      Not Delegated - Endocrinology:  Oral Corticosteroids Failed - 09/26/2020 11:58 AM      Failed - This refill cannot be delegated      Failed - Valid encounter within last 6 months    Recent Outpatient Visits  7 months ago Acute renal failure, unspecified acute renal failure type [facility])   [place], [place], NP   3 years ago Gynecomastia   [place] Clent Demark, PA-C                Passed - Last BP in normal range    BP Readings from Last 1 Encounters:  02/10/20 113/82

## 2020-09-26 NOTE — Telephone Encounter (Signed)
Copied from [place] [id number]. Topic: Quick Communication - Rx Refill/Question >> Sep 26, 2020 11:45 AM Tessa Lerner A wrote: Medication: amLODipine (NORVASC) 10 MG tablet  sulfamethoxazole-trimethoprim (BACTRIM) 400-80 MG tablet  pantoprazole (PROTONIX) 40 MG tablet  torsemide (DEMADEX) 100 MG tablet  aspirin 81 MG chewable tablet predniSONE (DELTASONE) 20 MG tablet  Has the patient contacted their pharmacy? Patient has reached out to pharmacy and been directed to contact PCP  Preferred Pharmacy (with phone number or street name): [place], [address]  Phone:  [phone]  Agent: Please be advised that RX refills may take up to 3 business days. We ask that you follow-up with your pharmacy.

## 2020-09-27 ENCOUNTER — Other Ambulatory Visit: Payer: Self-pay

## 2020-09-27 ENCOUNTER — Emergency Department (HOSPITAL_COMMUNITY): Payer: Medicaid Other

## 2020-09-27 ENCOUNTER — Encounter (HOSPITAL_COMMUNITY): Payer: Self-pay

## 2020-09-27 ENCOUNTER — Inpatient Hospital Stay (HOSPITAL_COMMUNITY)
Admission: EM | Admit: 2020-09-27 | Discharge: 2020-10-03 | DRG: 684 | Disposition: A | Discharge status: Home / Self Care | Payer: Medicaid Other | Attending: Internal Medicine | Admitting: Internal Medicine

## 2020-09-27 DIAGNOSIS — F1290 Cannabis use, unspecified, uncomplicated: Secondary | ICD-10-CM | POA: Diagnosis present

## 2020-09-27 DIAGNOSIS — E785 Hyperlipidemia, unspecified: Secondary | ICD-10-CM | POA: Diagnosis present

## 2020-09-27 DIAGNOSIS — Z79899 Other long term (current) drug therapy: Secondary | ICD-10-CM

## 2020-09-27 DIAGNOSIS — F17200 Nicotine dependence, unspecified, uncomplicated: Secondary | ICD-10-CM | POA: Diagnosis present

## 2020-09-27 DIAGNOSIS — I10 Essential (primary) hypertension: Secondary | ICD-10-CM | POA: Diagnosis present

## 2020-09-27 DIAGNOSIS — N189 Chronic kidney disease, unspecified: Secondary | ICD-10-CM | POA: Diagnosis present

## 2020-09-27 DIAGNOSIS — R601 Generalized edema: Secondary | ICD-10-CM | POA: Diagnosis present

## 2020-09-27 DIAGNOSIS — Z7982 Long term (current) use of aspirin: Secondary | ICD-10-CM

## 2020-09-27 DIAGNOSIS — Z20822 Contact with and (suspected) exposure to covid-19: Secondary | ICD-10-CM | POA: Diagnosis present

## 2020-09-27 DIAGNOSIS — Z9114 Patient's other noncompliance with medication regimen: Secondary | ICD-10-CM

## 2020-09-27 DIAGNOSIS — N041 Nephrotic syndrome with focal and segmental glomerular lesions: Secondary | ICD-10-CM | POA: Diagnosis present

## 2020-09-27 DIAGNOSIS — N049 Nephrotic syndrome with unspecified morphologic changes: Secondary | ICD-10-CM

## 2020-09-27 DIAGNOSIS — N179 Acute kidney failure, unspecified: Secondary | ICD-10-CM | POA: Diagnosis not present

## 2020-09-27 LAB — CBC WITH DIFFERENTIAL/PLATELET
Abs Immature Granulocytes: 0.17 10*3/uL — ABNORMAL HIGH (ref 0.00–0.07)
Basophils Absolute: 0.1 10*3/uL (ref 0.0–0.1)
Basophils Relative: 0 %
Eosinophils Absolute: 0 10*3/uL (ref 0.0–0.5)
Eosinophils Relative: 0 %
HCT: 31.7 % — ABNORMAL LOW (ref 39.0–52.0)
Hemoglobin: 10.7 g/dL — ABNORMAL LOW (ref 13.0–17.0)
Immature Granulocytes: 1 %
Lymphocytes Relative: 21 %
Lymphs Abs: 2.9 10*3/uL (ref 0.7–4.0)
MCH: 29.3 pg (ref 26.0–34.0)
MCHC: 33.8 g/dL (ref 30.0–36.0)
MCV: 86.8 fL (ref 80.0–100.0)
Monocytes Absolute: 1.3 10*3/uL — ABNORMAL HIGH (ref 0.1–1.0)
Monocytes Relative: 9 %
Neutro Abs: 9.4 10*3/uL — ABNORMAL HIGH (ref 1.7–7.7)
Neutrophils Relative %: 69 %
Platelets: 437 10*3/uL — ABNORMAL HIGH (ref 150–400)
RBC: 3.65 MIL/uL — ABNORMAL LOW (ref 4.22–5.81)
RDW: 12.2 % (ref 11.5–15.5)
WBC: 13.8 10*3/uL — ABNORMAL HIGH (ref 4.0–10.5)
nRBC: 0 % (ref 0.0–0.2)

## 2020-09-27 LAB — COMPREHENSIVE METABOLIC PANEL
ALT: 18 U/L (ref 0–44)
AST: 16 U/L (ref 15–41)
Albumin: <1 g/dL — ABNORMAL LOW (ref 3.5–5.0)
Alkaline Phosphatase: 54 U/L (ref 38–126)
Anion gap: 3 — ABNORMAL LOW (ref 5–15)
BUN: 45 mg/dL — ABNORMAL HIGH (ref 6–20)
CO2: 26 mmol/L (ref 22–32)
Calcium: 7.3 mg/dL — ABNORMAL LOW (ref 8.9–10.3)
Chloride: 108 mmol/L (ref 98–111)
Creatinine, Ser: 3.1 mg/dL — ABNORMAL HIGH (ref 0.61–1.24)
GFR, Estimated: 28 mL/min — ABNORMAL LOW (ref >60)
Glucose, Bld: 89 mg/dL (ref 70–99)
Potassium: 4.6 mmol/L (ref 3.5–5.1)
Sodium: 137 mmol/L (ref 135–145)
Total Bilirubin: 0.5 mg/dL (ref 0.3–1.2)
Total Protein: 3.3 g/dL — ABNORMAL LOW (ref 6.5–8.1)

## 2020-09-27 LAB — PROTEIN / CREATININE RATIO, URINE
Creatinine, Urine: 209.16 mg/dL
Protein Creatinine Ratio: 4.72 mg/mg{Cre} — ABNORMAL HIGH (ref 0.00–0.15)
Total Protein, Urine: 987 mg/dL

## 2020-09-27 LAB — URINALYSIS, ROUTINE W REFLEX MICROSCOPIC
Bilirubin Urine: NEGATIVE
Glucose, UA: 50 mg/dL — AB
Ketones, ur: NEGATIVE mg/dL
Leukocytes,Ua: NEGATIVE
Nitrite: NEGATIVE
Protein, ur: >=300 mg/dL — AB
Specific Gravity, Urine: 1.015 (ref 1.005–1.030)
pH: 5 (ref 5.0–8.0)

## 2020-09-27 LAB — LIPASE, BLOOD: Lipase: 51 U/L (ref 11–51)

## 2020-09-27 LAB — RESP PANEL BY RT-PCR (FLU A&B, COVID) ARPGX2
Influenza A by PCR: NEGATIVE
Influenza B by PCR: NEGATIVE
SARS Coronavirus 2 by RT PCR: NEGATIVE

## 2020-09-27 LAB — SODIUM, URINE, RANDOM: Sodium, Ur: 93 mmol/L

## 2020-09-27 LAB — CREATININE, URINE, RANDOM: Creatinine, Urine: 210.59 mg/dL

## 2020-09-27 MED ORDER — ACETAMINOPHEN 650 MG RE SUPP: 650.0000 mg | Freq: Four times a day (QID) | RECTAL | Status: DC | PRN

## 2020-09-27 MED ORDER — PANTOPRAZOLE SODIUM 40 MG PO TBEC
40.0000 mg | DELAYED_RELEASE_TABLET | Freq: Every day | ORAL | Status: DC
Start: 2020-09-27 — End: 2020-10-03
  Administered 2020-09-28 – 2020-10-03 (×6): 40 mg via ORAL
  Filled 2020-09-27 (×6): qty 1

## 2020-09-27 MED ORDER — HEPARIN SODIUM (PORCINE) 5000 UNIT/ML IJ SOLN
5000.0000 [IU] | Freq: Three times a day (TID) | INTRAMUSCULAR | Status: DC
  Administered 2020-09-27 – 2020-10-02 (×16): 5000 [IU] via SUBCUTANEOUS
  Filled 2020-09-27 (×17): qty 1

## 2020-09-27 MED ORDER — FUROSEMIDE 10 MG/ML IJ SOLN
80.0000 mg | Freq: Once | INTRAMUSCULAR | Status: DC
  Filled 2020-09-27: qty 8

## 2020-09-27 MED ORDER — AMLODIPINE BESYLATE 10 MG PO TABS
10.0000 mg | ORAL_TABLET | Freq: Every day | ORAL | Status: DC
  Administered 2020-09-28 – 2020-10-03 (×6): 10 mg via ORAL
  Filled 2020-09-27 (×6): qty 1

## 2020-09-27 MED ORDER — ACETAMINOPHEN 325 MG PO TABS: 650.0000 mg | ORAL_TABLET | Freq: Four times a day (QID) | ORAL | Status: DC | PRN

## 2020-09-27 MED ORDER — ALBUMIN HUMAN 25 % IV SOLN
12.5000 g | Freq: Once | INTRAVENOUS | Status: DC
  Filled 2020-09-27: qty 50

## 2020-09-27 MED ORDER — FUROSEMIDE 10 MG/ML IJ SOLN
80.0000 mg | Freq: Three times a day (TID) | INTRAMUSCULAR | Status: DC
  Administered 2020-09-27 – 2020-09-28 (×4): 80 mg via INTRAVENOUS
  Filled 2020-09-27 (×3): qty 8

## 2020-09-27 MED ORDER — ASPIRIN 81 MG PO CHEW
81.0000 mg | CHEWABLE_TABLET | Freq: Every day | ORAL | Status: DC
Start: 2020-09-27 — End: 2020-10-03
  Administered 2020-09-28 – 2020-10-03 (×6): 81 mg via ORAL
  Filled 2020-09-27 (×6): qty 1

## 2020-09-27 MED ORDER — ALBUMIN HUMAN 25 % IV SOLN
12.5000 g | Freq: Three times a day (TID) | INTRAVENOUS | Status: AC
  Administered 2020-09-27 – 2020-09-29 (×6): 12.5 g via INTRAVENOUS
  Filled 2020-09-27 (×6): qty 50

## 2020-09-27 NOTE — Consult Note (Signed)
Renal Service Consult Note [facility]  Kru Novick 09/27/2020 [name], MD Requesting Physician: Dr. Darl Householder  Reason for Consult: Renal failure HPI: The patient is a 25 y.o. year-old w/ hx of nephrotic syndrome presenting to ED today for swelling in the face , neck and legs. Mild abd pain.  No n/v/d.  Edema worsening for 1 week.  Pt ran out of kidney medication (minimal change and/ or FSG) per ED team. Creat is 3.2 here. Last creat here was 0.95 in July 2021.  Asked to see for renal failure.   Patient states he had kidney biopsy last year around July.  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  Pt states last office visit around Oct 2021. Pt states then he ran out of medications around [facility] Dec 2021 and couldn't afford new prescriptions so hasn't taken his prednisone or diuretics for the last 2-3 mos. At the time he states he was having financial/ job issues and didn't have a place to live and didn't have a car. Since stopping meds in December he has started to swell up again in the legs first and more recently in the face and neck.  States his situation is more stable now, he now has a car, and he has some prednisone but felt he should be seen 1st before restarting it, so he came to the hospital.     He denies any fevers. +skin rash on lower legs where most swollen. No prod cough, +loose stools off and on.  No abd pain. Mild DOE.     ROS  denies CP  no joint pain   no HA  no blurry vision  no rash  no difficulty voiding  no change in urine color    Past Medical History  Past Medical History:  Diagnosis Date  . Nephrotic syndrome    Past Surgical History  Past Surgical History:  Procedure Laterality Date  . IR FLUORO GUIDE CV LINE RIGHT  01/31/2020  . IR US GUIDE VASC ACCESS RIGHT  01/31/2020  . TONSILLECTOMY     Family History  Family History  Problem Relation Age of Onset  . Healthy Mother   . Healthy Father    Social  History  reports that he has been smoking. He has never used smokeless tobacco. He reports previous alcohol use. He reports current drug use. Drug: Marijuana. Allergies  Allergies  Allergen Reactions  . Broccoli [Brassica Oleracea] Hives    CAULIFLOWER (allergic, also)   Home medications Prior to Admission medications   Medication Sig Start Date End Date Taking? Authorizing Provider  amLODipine (NORVASC) 10 MG tablet Take 1 tablet (10 mg total) by mouth daily. 02/02/20   Aline August, MD  aspirin 81 MG chewable tablet Chew 1 tablet (81 mg total) by mouth daily. 02/02/20   Aline August, MD  pantoprazole (PROTONIX) 40 MG tablet Take 1 tablet (40 mg total) by mouth daily. 02/02/20   Aline August, MD  sulfamethoxazole-trimethoprim (BACTRIM) 400-80 MG tablet Take 1 tablet by mouth daily. 02/02/20   Aline August, MD  torsemide (DEMADEX) 100 MG tablet Take 1 tablet (100 mg total) by mouth daily. 02/02/20   Aline August, MD     Vitals:   09/27/20 1330 09/27/20 1345 09/27/20 1400 09/27/20 1501  BP: (!) 156/100 (!) 152/107 (!) 145/96 113/74  Pulse: 80 71 74 81  Resp: (!) '24 17 15 '$ (!) 9  Temp:  TempSrc:      SpO2: 100% 100% 100% 100%  Weight:      Height:       Exam Gen alert, no distress, facial edema+ No cyanosis or gangrene Sclera anicteric, throat clear  No jvd or bruits Chest clear bilat to bases, no rales/ wheezing RRR no MRG Abd soft ntnd no mass or ascites +bs GU normal MS no joint effusions or deformity Ext diffuse bilat LE edema 2+ , lower leg rash bilat w/o purulence  Neuro is alert, Ox 3 , nf       Home meds:  - norvasc 10/ demadex 100 qd  - asa 81/ protonix 40 qd  - prn's/ vitamins/ supplements    CXR - no active disease   Last UA 01/28/20 - nazy, >300 prot, many bact, 0-5 rbc/ wbc   Last UPC ratio 01/28/20 - 10.63   Na 137  K 4.6  CO2 26  BUN 45  Cr 3.10  Alb < 1.0  Tprot 3.3   WBC 13K Hb 10.7  plt 437    Assessment/ Plan: 1. Renal failure - b/l creat  around 0.9- 1.0 from mid 2021. Creat 3 here. He likely has progression of his renal condition (FSGS). Possibly also could have ATN related to severe neph syndrome. Pt is f/b CKA. He was lost to f/u and stopped taking his prednisone/ diuretics about 2-3 mos ago.  Will get office records and d/w primary nephrologist.  2. Vol overload / edema - in pt w/ nephrotic syndrome, very low serum albumin. No signs of intravasc vol overload. Plan IV lasix along w/ IV albumin tid for now.  3. HTN - resume home meds (norvasc) as needed      Kelly Splinter  MD 09/27/2020, 3:28 PM  Recent Labs  Lab 09/27/20 1330  WBC 13.8*  HGB 10.7*   Recent Labs  Lab 09/27/20 1330  K 4.6  BUN 45*  CREATININE 3.10*  CALCIUM 7.3*

## 2020-09-27 NOTE — ED Provider Notes (Signed)
[facility] DEPT Provider Note   CSN: [id number] Arrival date & time: 09/27/20  1230     History Chief Complaint  Patient presents with  . Facial Swelling  . Leg Swelling    Philip Trevino is a 25 y.o. male.  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 States that he has had BLE swelling for several months, and this has been somewhat intermittent. However, he states that for the last month the swelling has been more persistent and in the last week his face has also become swelling.   He denies any difficulty breathing, but he does states that he has some difficulty talking and swallowing due to the swelling in his throat.   He states he has not been taking his meds for about 3 months due to financial reasons.   Past Medical History:  Diagnosis Date  . Nephrotic syndrome     Patient Active Problem List   Diagnosis Date Noted  . AKI (acute kidney injury) ([place]) 09/27/2020  . Anasarca 01/28/2020  . ARF (acute renal failure) ([place]) 01/28/2020  . Normochromic normocytic anemia 01/28/2020  . Elevated blood pressure reading 01/28/2020  . 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 Anemia, unspecified 01/28/2020  . Benign essential HTN 01/28/2020  . Nephrotic syndrome     Past Surgical History:  Procedure Laterality Date  . IR FLUORO GUIDE CV LINE RIGHT  01/31/2020  . IR US GUIDE VASC ACCESS RIGHT  01/31/2020  . TONSILLECTOMY         Family History  Problem Relation Age of Onset  . Healthy Mother   . Healthy Father     Social History   Tobacco Use  . Smoking status: Current Every Day Smoker  . Smokeless tobacco: Never Used  . Tobacco comment: smoke marijuana  Vaping Use  . Vaping Use: Never used  Substance Use Topics  . Alcohol use: Not Currently  . Drug use: Yes    Types: Marijuana    Home Medications Prior to Admission medications   Medication Sig Start Date End Date  Taking? Authorizing Provider  amLODipine (NORVASC) 10 MG tablet Take 1 tablet (10 mg total) by mouth daily. 02/02/20   Aline August, MD  aspirin 81 MG chewable tablet Chew 1 tablet (81 mg total) by mouth daily. 02/02/20   Aline August, MD  pantoprazole (PROTONIX) 40 MG tablet Take 1 tablet (40 mg total) by mouth daily. 02/02/20   Aline August, MD  sulfamethoxazole-trimethoprim (BACTRIM) 400-80 MG tablet Take 1 tablet by mouth daily. 02/02/20   Aline August, MD  torsemide (DEMADEX) 100 MG tablet Take 1 tablet (100 mg total) by mouth daily. 02/02/20   Aline August, MD    Allergies    Eual Fines Marta Lamas oleracea]  Review of Systems   Review of Systems  Constitutional: Negative for fever.  HENT: Positive for facial swelling and trouble swallowing. Negative for ear pain.   Eyes: Negative for visual disturbance.  Respiratory: Negative for cough and shortness of breath.   Cardiovascular: Positive for leg swelling. Negative for chest pain.  Gastrointestinal: Negative for abdominal pain, constipation, diarrhea, nausea and vomiting.  Genitourinary: Negative for dysuria and hematuria.  Musculoskeletal: Negative for back pain.  Skin: Negative for rash.  Neurological: Negative for headaches.  All other systems reviewed and are negative.   Physical Exam Updated Vital Signs BP 113/74   Pulse 81  Temp 98.2 F (36.8 C) (Oral)   Resp (!) 9   Ht '5\' 9"'$  (1.753 m)   Wt 68 kg   SpO2 100%   BMI 22.15 kg/m   Physical Exam Vitals and nursing note reviewed.  Constitutional:      Appearance: He is well-developed and well-nourished.  HENT:     Head: Normocephalic and atraumatic.     Comments: Diffuse facial edema, no swelling to lips or tongue. Tolerating secretions with normal voice Eyes:     Conjunctiva/sclera: Conjunctivae normal.  Cardiovascular:     Rate and Rhythm: Normal rate and regular rhythm.     Heart sounds: Normal heart sounds. No murmur heard.   Pulmonary:     Effort:  Pulmonary effort is normal. No respiratory distress.     Breath sounds: Normal breath sounds. No wheezing, rhonchi or rales.  Abdominal:     General: Bowel sounds are normal. There is distension.     Palpations: Abdomen is soft.     Tenderness: There is no abdominal tenderness. There is no guarding or rebound.  Musculoskeletal:     Cervical back: Neck supple.     Right lower leg: Edema present.     Left lower leg: Edema present.     Comments: 3-4+ edema to the ble  Skin:    General: Skin is warm and dry.  Neurological:     Mental Status: He is alert.  Psychiatric:        Mood and Affect: Mood and affect normal.     ED Results / Procedures / Treatments   Labs (all labs ordered are listed, but only abnormal results are displayed) Labs Reviewed  CBC WITH DIFFERENTIAL/PLATELET - Abnormal; Notable for the following components:      Result Value   WBC 13.8 (*)    RBC 3.65 (*)    Hemoglobin 10.7 (*)    HCT 31.7 (*)    Platelets 437 (*)    Neutro Abs 9.4 (*)    Monocytes Absolute 1.3 (*)    Abs Immature Granulocytes 0.17 (*)    All other components within normal limits  COMPREHENSIVE METABOLIC PANEL - Abnormal; Notable for the following components:   BUN 45 (*)    Creatinine, Ser 3.10 (*)    Calcium 7.3 (*)    Total Protein 3.3 (*)    Albumin <1.0 (*)    GFR, Estimated 28 (*)    Anion gap 3 (*)    All other components within normal limits  RESP PANEL BY RT-PCR (FLU A&B, COVID) ARPGX2  URINALYSIS, ROUTINE W REFLEX MICROSCOPIC  SODIUM, URINE, RANDOM  CREATININE, URINE, RANDOM  PROTEIN / CREATININE RATIO, URINE    EKG None  Radiology DG Chest Portable 1 View  Result Date: 09/27/2020 CLINICAL DATA:  Swelling of the face and neck. Question fluid overload. EXAM: PORTABLE CHEST 1 VIEW COMPARISON:  01/27/2020 FINDINGS: Heart size upper limits of normal. Mediastinal shadows are normal. Mild blunting of the lateral inferior costophrenic angle on the left could be a small amount  of pleural fluid or pleural scarring from previous abnormality in 2021. Consider two-view chest radiography when able. No pulmonary edema. IMPRESSION: Mild blunting of the lateral inferior costophrenic angle on the left could be a small amount of pleural fluid or pleural scarring from previous abnormality in 2021. Consider two-view chest radiography when able. Electronically Signed   By: Nelson Chimes M.D.   On: 09/27/2020 14:46    Procedures Procedures   Medications Ordered  in ED Medications  albumin human 25 % solution 12.5 g (has no administration in time range)  furosemide (LASIX) injection 80 mg (has no administration in time range)    ED Course  I have reviewed the triage vital signs and the nursing notes.  Pertinent labs & imaging results that were available during my care of the patient were reviewed by me and considered in my medical decision making (see chart for details).    MDM Rules/Calculators/A&P                           25 y/o M presents to the ED today for eval of anasarca. Hx nephrotic syndrome.   Reviewed/interpreted labs CBC with leukocytosis, and mild anemia which appears stable. Thrombocytosis noted as well.  CMP with elevated BUN/Cr, low protein and low albumin Urine studies pending at the time of admission  COVID test pending on admission  Pt with anasarca. H/o nephrotic syndrome, appears to have recurrence of this today. Also with aki. [place] he will require admission for diuresis.   8:10 PM CONSULT with Dr. Jonnie Finner with nephrology who states that he will need to see the patient before making any recommendations on medications. He states he will see the patient either today or tomorrow. He does not feel that the patient needs to be transferred to [facility].   Pt seen in conjunction with Dr. Darl Householder who recommends giving iv lasix and albumin  3:24 PM CONSULT with Dr. Neysa Bonito who accepts patient for admission .  Final Clinical Impression(s) / ED Diagnoses Final  diagnoses:  Nephrotic syndrome    Rx / DC Orders ED Discharge Orders    None       Rodney Booze, PA-C 09/27/20 1539    Drenda Freeze, MD 09/28/20 1459

## 2020-09-27 NOTE — ED Triage Notes (Signed)
Pt came from home via POV. Pt is severely swollen in face and neck. 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 Pt reports facial and neck swelling has been going on for a week. Swelling gotten worse last night, sometimes difficult to breathe and swallow d/t swelling. Pt reports difficulty laying on back d/t pressure in neck. Pt also reports swelling in legs and feet, new sores on bilateral legs, and slight abdominal pain around umbilicus.

## 2020-09-27 NOTE — Plan of Care (Signed)
Patient admitted care plans initiated

## 2020-09-27 NOTE — ED Notes (Signed)
Pt aware urine sample needed 

## 2020-09-27 NOTE — ED Notes (Signed)
Called report to [place], [place]

## 2020-09-27 NOTE — H&P (Addendum)
History and Physical        Hospital Admission Note Date: 09/27/2020  Patient name: Philip Trevino Medical record number: [id number] Date of birth: 05-11-1996 Age: 25 y.o. Gender: male  PCP: Clent Demark, PA-C    Chief Complaint    Chief Complaint  Patient presents with  . Facial Swelling  . Leg Swelling      HPI:   This is a 25 year old male with past medical history of nephrotic syndrome (minimal-change disease and/or FSGS), medication noncompliance who presented to the ED for evaluation of leg and facial swelling which has worsened over the past week.  Has had bilateral lower extremity swelling for several months which has been intermittent however for the past month the swelling has been more persistent in the last week has involved his face. Has not been taking his medications for the past 3 months due to financial reasons.  Does not have issues with eating or breathing but states that he feels like he is "lightly being choked."   ED Course: Afebrile, hypertensive, on room air. Notable Labs: Sodium 137, K4.6, glucose 89, BUN 45, creatinine 3.1, albumin <1.0, T protein 3.3, T bili 0.5, WBC 13.8, Hb 10.7, COVID-19 pending. Notable Imaging: CXR-mild blunting of lateral inferior costophrenic angle on the left could be a small amount of pleural fluid or pleural scarring from previous abnormality in 2021.  Renal US-no hydronephrosis, bladder wall thickening with layering debris in the bladder and trace abdominal pelvic ascites.  Nephrology was consulted by the ED provider.   Vitals:   09/27/20 1400 09/27/20 1501  BP: (!) 145/96 113/74  Pulse: 74 81  Resp: 15 (!) 9  Temp:    SpO2: 100% 100%     Review of Systems:  Review of Systems  All other systems reviewed and are negative.   Medical/Social/Family History   Past Medical History: Past Medical History:  Diagnosis  Date  . Nephrotic syndrome     Past Surgical History:  Procedure Laterality Date  . IR FLUORO GUIDE CV LINE RIGHT  01/31/2020  . IR US GUIDE VASC ACCESS RIGHT  01/31/2020  . TONSILLECTOMY      Medications: Prior to Admission medications   Medication Sig Start Date End Date Taking? Authorizing Provider  amLODipine (NORVASC) 10 MG tablet Take 1 tablet (10 mg total) by mouth daily. 02/02/20   Aline August, MD  aspirin 81 MG chewable tablet Chew 1 tablet (81 mg total) by mouth daily. 02/02/20   Aline August, MD  pantoprazole (PROTONIX) 40 MG tablet Take 1 tablet (40 mg total) by mouth daily. 02/02/20   Aline August, MD  sulfamethoxazole-trimethoprim (BACTRIM) 400-80 MG tablet Take 1 tablet by mouth daily. 02/02/20   Aline August, MD  torsemide (DEMADEX) 100 MG tablet Take 1 tablet (100 mg total) by mouth daily. 02/02/20   Aline August, MD    Allergies:   Allergies  Allergen Reactions  . Broccoli [Brassica Oleracea] Hives    CAULIFLOWER (allergic, also)    Social History:  reports that he has been smoking. He has never used smokeless tobacco. He reports previous alcohol use. He reports current drug use. Drug: Marijuana.  Family History: Family History  Problem Relation Age of  Onset  . Healthy Mother   . Healthy Father      Objective   Physical Exam: Blood pressure 113/74, pulse 81, temperature 98.2 F (36.8 C), temperature source Oral, resp. rate (!) 9, height '5\' 9"'$  (1.753 m), weight 68 kg, SpO2 100 %.  Physical Exam Vitals and nursing note reviewed.  Constitutional:      General: He is not in acute distress. HENT:     Head:     Comments: Edematous face with swollen eyelids    Mouth/Throat:     Mouth: Mucous membranes are moist.     Comments: Tongue not swollen Cardiovascular:     Rate and Rhythm: Normal rate and regular rhythm.  Pulmonary:     Effort: Pulmonary effort is normal. No respiratory distress.  Abdominal:     General: Abdomen is flat.      Tenderness: There is abdominal tenderness in the epigastric area.  Musculoskeletal:     Comments: 3+ bilateral lower extremity pitting edema  Skin:    Comments: Bilateral lower extremity nontender rash over her shins piercing multiple stages of healing  Neurological:     Mental Status: He is alert. Mental status is at baseline.  Psychiatric:        Mood and Affect: Mood normal.        Behavior: Behavior normal.     LABS on Admission: I have personally reviewed all the labs and imaging below    Basic Metabolic Panel: Recent Labs  Lab 09/27/20 1330  NA 137  K 4.6  CL 108  CO2 26  GLUCOSE 89  BUN 45*  CREATININE 3.10*  CALCIUM 7.3*   Liver Function Tests: Recent Labs  Lab 09/27/20 1330  AST 16  ALT 18  ALKPHOS 54  BILITOT 0.5  PROT 3.3*  ALBUMIN <1.0*   No results for input(s): LIPASE, AMYLASE in the last 168 hours. No results for input(s): AMMONIA in the last 168 hours. CBC: Recent Labs  Lab 09/27/20 1330  WBC 13.8*  NEUTROABS 9.4*  HGB 10.7*  HCT 31.7*  MCV 86.8  PLT 437*   Cardiac Enzymes: No results for input(s): CKTOTAL, CKMB, CKMBINDEX, TROPONINI in the last 168 hours. BNP: Invalid input(s): POCBNP CBG: No results for input(s): GLUCAP in the last 168 hours.  Radiological Exams on Admission:  US Renal  Result Date: 09/27/2020 CLINICAL DATA:  Renal failure. EXAM: RENAL / URINARY TRACT ULTRASOUND COMPLETE COMPARISON:  CT abdomen pelvis August 27, 2017 FINDINGS: Right Kidney: Renal measurements: 11.5 x 6.5 x 5.3 cm = volume: 205 mL. Echogenicity within normal limits. No mass or hydronephrosis visualized. Left Kidney: Renal measurements: 13.5 x 5.8 x 5.8 cm = volume: 240 mL. Echogenicity within normal limits. No mass or hydronephrosis visualized. Bladder: There is bladder wall thickening measuring up to 4 mm with layering debris in the bladder. Other: Trace abdominopelvic ascites. IMPRESSION: 1. No hydronephrosis 2. Bladder wall thickening with layering  debris in the bladder. Correlate with urinalysis. 3. Trace abdominopelvic ascites. Electronically Signed   By: Dahlia Bailiff MD   On: 09/27/2020 15:52   DG Chest Portable 1 View  Result Date: 09/27/2020 CLINICAL DATA:  Swelling of the face and neck. Question fluid overload. EXAM: PORTABLE CHEST 1 VIEW COMPARISON:  01/27/2020 FINDINGS: Heart size upper limits of normal. Mediastinal shadows are normal. Mild blunting of the lateral inferior costophrenic angle on the left could be a small amount of pleural fluid or pleural scarring from previous abnormality in 2021. Consider two-view chest  radiography when able. No pulmonary edema. IMPRESSION: Mild blunting of the lateral inferior costophrenic angle on the left could be a small amount of pleural fluid or pleural scarring from previous abnormality in 2021. Consider two-view chest radiography when able. Electronically Signed   By: Nelson Chimes M.D.   On: 09/27/2020 14:46      EKG: Not done   A & P   Principal Problem:   AKI (acute kidney injury) ([place]) Active Problems:   Anasarca   Nephrotic syndrome   Benign essential HTN   1. 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 Creatinine 3.1, baseline 0.9-1.0 b. Likely from medication noncompliance due to financial issues -> TOC consult c. Renal ultrasound unremarkable d. UA pending e. Nephrology consulted: Lasix 80 mg IV every 8 hours with IV albumin 3 times daily f. Continue home aspirin for VTE prophylaxis and holding home Bactrim (PGP prophylaxis) due to renal dysfunction  2. Anasarca secondary to nephrotic syndrome a. Albumin undetectable b. Albumin as above  3. Hypertension a. Continue home Norvasc  4. Epigastric abdominal pain a. Check lipase    DVT prophylaxis: Lovenox   Code Status: Full Code  Diet: Heart healthy Family Communication: Admission, patients condition and plan of care including tests being ordered have been discussed with the  patient who indicates understanding and agrees with the plan and Code Status. Disposition Plan: The appropriate patient status for this patient is INPATIENT. Inpatient status is judged to be reasonable and necessary in order to provide the required intensity of service to ensure the patient's safety. The patient's presenting symptoms, physical exam findings, and initial radiographic and laboratory data in the context of their chronic comorbidities is felt to place them at high risk for further clinical deterioration. Furthermore, it is not anticipated that the patient will be medically stable for discharge from the hospital within 2 midnights of admission. The following factors support the patient status of inpatient.   " The patient's presenting symptoms include diffuse swelling. " The worrisome physical exam findings include anasarca. " The initial radiographic and laboratory data are worrisome because of undetectable albumin, AKI. " The chronic co-morbidities include nephrotic syndrome.   * I certify that at the point of admission it is my clinical judgment that the patient will require inpatient hospital care spanning beyond 2 midnights from the point of admission due to high intensity of service, high risk for further deterioration and high frequency of surveillance required.*   Status is: Inpatient  Remains inpatient appropriate because:IV treatments appropriate due to intensity of illness or inability to take PO and Inpatient level of care appropriate due to severity of illness   Dispo: The patient is from: Home              Anticipated d/c is to: Home              Patient currently is not medically stable to d/c.   Difficult to place patient No    Consultants  . Nephrology  Procedures  . None  Time Spent on Admission: 66 minutes    Harold Hedge, DO [facility]  09/27/2020, 5:02 PM

## 2020-09-28 LAB — BASIC METABOLIC PANEL
Anion gap: 4 — ABNORMAL LOW (ref 5–15)
BUN: 45 mg/dL — ABNORMAL HIGH (ref 6–20)
CO2: 26 mmol/L (ref 22–32)
Calcium: 7.3 mg/dL — ABNORMAL LOW (ref 8.9–10.3)
Chloride: 107 mmol/L (ref 98–111)
Creatinine, Ser: 2.66 mg/dL — ABNORMAL HIGH (ref 0.61–1.24)
GFR, Estimated: 34 mL/min — ABNORMAL LOW (ref >60)
Glucose, Bld: 91 mg/dL (ref 70–99)
Potassium: 4.7 mmol/L (ref 3.5–5.1)
Sodium: 137 mmol/L (ref 135–145)

## 2020-09-28 LAB — CBC
HCT: 31 % — ABNORMAL LOW (ref 39.0–52.0)
Hemoglobin: 10.5 g/dL — ABNORMAL LOW (ref 13.0–17.0)
MCH: 29.5 pg (ref 26.0–34.0)
MCHC: 33.9 g/dL (ref 30.0–36.0)
MCV: 87.1 fL (ref 80.0–100.0)
Platelets: 353 10*3/uL (ref 150–400)
RBC: 3.56 MIL/uL — ABNORMAL LOW (ref 4.22–5.81)
RDW: 12.3 % (ref 11.5–15.5)
WBC: 9.7 10*3/uL (ref 4.0–10.5)
nRBC: 0 % (ref 0.0–0.2)

## 2020-09-28 LAB — RENAL FUNCTION PANEL
Albumin: 1.5 g/dL — ABNORMAL LOW (ref 3.5–5.0)
Anion gap: 5 (ref 5–15)
BUN: 48 mg/dL — ABNORMAL HIGH (ref 6–20)
CO2: 25 mmol/L (ref 22–32)
Calcium: 7.2 mg/dL — ABNORMAL LOW (ref 8.9–10.3)
Chloride: 107 mmol/L (ref 98–111)
Creatinine, Ser: 2.53 mg/dL — ABNORMAL HIGH (ref 0.61–1.24)
GFR, Estimated: 36 mL/min — ABNORMAL LOW (ref >60)
Glucose, Bld: 90 mg/dL (ref 70–99)
Phosphorus: 5.4 mg/dL — ABNORMAL HIGH (ref 2.5–4.6)
Potassium: 4.7 mmol/L (ref 3.5–5.1)
Sodium: 137 mmol/L (ref 135–145)

## 2020-09-28 MED ORDER — LISINOPRIL 10 MG PO TABS
10.0000 mg | ORAL_TABLET | Freq: Every day | ORAL | Status: DC
  Administered 2020-09-28 – 2020-10-03 (×6): 10 mg via ORAL
  Filled 2020-09-28 (×6): qty 1

## 2020-09-28 MED ORDER — FUROSEMIDE 10 MG/ML IJ SOLN
120.0000 mg | Freq: Three times a day (TID) | INTRAVENOUS | Status: DC
  Administered 2020-09-28 – 2020-09-29 (×2): 120 mg via INTRAVENOUS
  Filled 2020-09-28: qty 12
  Filled 2020-09-28: qty 10
  Filled 2020-09-28: qty 2

## 2020-09-28 MED ORDER — SODIUM CHLORIDE 0.9 % IV SOLN
INTRAVENOUS | Status: DC | PRN
  Administered 2020-09-28: 250 mL via INTRAVENOUS

## 2020-09-28 NOTE — TOC Initial Note (Signed)
Transition of Care [facility]) - Initial/Assessment Note    Patient Details  Name: Philip Trevino MRN: [id number] Date of Birth: 04-22-1996  [facility] CM/SW Contact:    Lennart Pall, LCSW Phone Number: 09/28/2020, 2:33 PM  Clinical Narrative:                 Received TOC referral today for possible medication assistance at dc.  Met with pt to discuss and gather more information.  Pt confirms that he is an established patient with Dr. Altamease Oiler at [place].  Report he gets his prescriptions filled at [facility], however, he has had difficulty affording the meds.  (I was able to confirm that [facility] used the [facility] program for pt in July 2021 and will not be able to access this again until one year post).  I contacted [facility] and confirmed with them that, if a patient is followed at that [facility] facility, they are eligible to use the pharmacy at [place] for medications - pt was unaware of this.  At discharge, would recommend that pt's scripts be sent to [facility] and pt can pick up there at a reduced cost (*will not fill narcotics).    Pt inquiring about possibility of receiving Medicaid.  I have spoken with Altamese Cabal with our Financial Counseling dept who confirms that pt has been referred to [place] who will complete full screen for Medicaid eligibility with pt and she will ask them to try and complete while pt still in hospitalized.   TOC will continue to follow for any additional needs.  Expected Discharge Plan: Home/Self Care Barriers to Discharge: Continued Medical Work up,Inadequate or no insurance   Patient Goals and CMS Choice Patient states their goals for this hospitalization and ongoing recovery are:: return home      Expected Discharge Plan and Services Expected Discharge Plan: Home/Self Care In-house Referral: Clinical Social Work     Living arrangements for the past 2 months: Single Family Home                                       Prior Living Arrangements/Services Living arrangements for the past 2 months: Single Family Home Lives with:: Significant Other,Minor Children Patient language and need for interpreter reviewed:: No Do you feel safe going back to the place where you live?: Yes      Need for Family Participation in Patient Care: No (Comment) Care giver support system in place?: Yes (comment)   Criminal Activity/Legal Involvement Pertinent to Current Situation/Hospitalization: No - Comment as needed  Activities of Daily Living Home Assistive Devices/Equipment: None ADL Screening (condition at time of admission) Patient's cognitive ability adequate to safely complete daily activities?: Yes Is the patient deaf or have difficulty hearing?: No Does the patient have difficulty seeing, even when wearing glasses/contacts?: No Does the patient have difficulty concentrating, remembering, or making decisions?: No Patient able to express need for assistance with ADLs?: Yes Does the patient have difficulty dressing or bathing?: No Independently performs ADLs?: Yes (appropriate for developmental age) Does the patient have difficulty walking or climbing stairs?: No Weakness of Legs: None Weakness of Arms/Hands: None  Permission Sought/Granted Permission sought to share information with : Other (comment),PCP (financial counseling dept) Permission granted to share information with : Yes, Verbal Permission Granted              Emotional Assessment Appearance::  Appears older than stated age Attitude/Demeanor/Rapport: Gracious,Engaged Affect (typically observed): Accepting,Pleasant Orientation: : Oriented to Self,Oriented to Place,Oriented to  Time,Oriented to Situation Alcohol / Substance Use: Not Applicable Psych Involvement: No (comment)  Admission diagnosis:  AKI (acute kidney injury) ([place]) [N17.9] Nephrotic syndrome [N04.9] Patient Active Problem List   Diagnosis Date Noted  . AKI (acute kidney  injury) ([place]) 09/27/2020  . Anasarca 01/28/2020  . ARF (acute renal failure) ([place]) 01/28/2020  . Normochromic normocytic anemia 01/28/2020  . Elevated blood pressure reading 01/28/2020  . 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 Anemia, unspecified 01/28/2020  . Benign essential HTN 01/28/2020  . Nephrotic syndrome    PCP:  Clent Demark, PA-C Pharmacy:   [facility] [place] [address] Phone: [phone] Fax: [phone]     Social Determinants of Health (SDOH) Interventions    Readmission Risk Interventions Readmission Risk Prevention Plan 09/28/2020  Post Dischage Appt Complete  Medication Screening Complete  Transportation Screening Complete  Some recent data might be hidden

## 2020-09-28 NOTE — Progress Notes (Signed)
PROGRESS NOTE    Philip Trevino  [id number] DOB: [birth year] DOA: 09/27/2020 PCP: Clent Demark, PA-C     Brief Narrative:  Philip Trevino is a 25 year old male with past medical history of nephrotic syndrome (minimal-change disease and/or FSGS), medication noncompliance who presented to the ED for evaluation of leg and facial swelling which has worsened over the past week.  Has had bilateral lower extremity swelling for several months which has been intermittent however for the past month the swelling has been more persistent in the last week has involved his face. Has not been taking his medications for the past 3 months due to financial reasons.  Does not have issues with eating or breathing but states that he feels like he is "lightly being choked."  New events last 24 hours / Subjective: States that the swelling as well as constriction around his throat has improved.  Continues to have good urine output.  No other physical complaints.  Assessment & Plan:   Principal Problem:   AKI (acute kidney injury) ([place]) Active Problems:   Anasarca   Nephrotic syndrome   Benign essential HTN   AKI in setting of nephrotic syndrome, FSGS and/or minimal-change disease -Baseline creatinine around 0.9-1.0 -Appreciate nephrology -Continue IV Lasix, IV albumin -Creatinine improving  Anasarca secondary to nephrotic syndrome -Continue IV Lasix, IV albumin -Daily weight and strict I's and O's  Hypertension -Continue Norvasc, Lasix   DVT prophylaxis:  heparin injection 5,000 Units Start: 09/27/20 2200  Code Status: Full code Family Communication: No family at bedside Disposition Plan:  Status is: Inpatient  Remains inpatient appropriate because:IV treatments appropriate due to intensity of illness or inability to take PO   Dispo: The patient is from: Home              Anticipated d/c is to: Home              Patient currently is not medically stable to d/c.  Remains on IV Lasix    Difficult to place patient No      Consultants:   Nephrology  Procedures:   None  Antimicrobials:  Anti-infectives (From admission, onward)   None        Objective: Vitals:   09/27/20 1756 09/27/20 2117 09/28/20 0136 09/28/20 0519  BP: (!) 143/93 (!) 138/95 135/80 128/86  Pulse: 69 77 62 67  Resp: '16 18 15 17  '$ Temp:  98.7 F (37.1 C) 97.9 F (36.6 C) 98 F (36.7 C)  TempSrc:  Oral Oral Oral  SpO2: 100% 99% 100% 99%  Weight:      Height:        Intake/Output Summary (Last 24 hours) at 09/28/2020 1207 Last data filed at 09/28/2020 1055 Gross per 24 hour  Intake 860.36 ml  Output 1200 ml  Net -339.64 ml   Filed Weights   09/27/20 1248  Weight: 68 kg    Examination:  General exam: Appears calm and comfortable, + Gross anasarca as well as swelling over his face  Respiratory system: Clear to auscultation. Respiratory effort normal. No respiratory distress. No conversational dyspnea.  Cardiovascular system: S1 & S2 heard, RRR. No murmurs.  Gastrointestinal system: Abdomen is nondistended, soft and nontender. Normal bowel sounds heard. Central nervous system: Alert and oriented. No focal neurological deficits. Speech clear.  Extremities: Symmetric in appearance  Skin: No rashes, lesions or ulcers on exposed skin  Psychiatry: Judgement and insight appear normal. Mood & affect appropriate.   Data Reviewed: I have personally reviewed following  labs and imaging studies  CBC: Recent Labs  Lab 09/27/20 1330 09/28/20 0327  WBC 13.8* 9.7  NEUTROABS 9.4*  --   HGB 10.7* 10.5*  HCT 31.7* 31.0*  MCV 86.8 87.1  PLT 437* 0000000   Basic Metabolic Panel: Recent Labs  Lab 09/27/20 1330 09/28/20 0327  NA 137 137  137  K 4.6 4.7  4.7  CL 108 107  107  CO2 '26 25  26  '$ GLUCOSE 89 90  91  BUN 45* 48*  45*  CREATININE 3.10* 2.53*  2.66*  CALCIUM 7.3* 7.2*  7.3*  PHOS  --  5.4*   GFR: Estimated Creatinine Clearance: 43.7 mL/min (A) (by C-G formula based on  SCr of 2.53 mg/dL (H)). Liver Function Tests: Recent Labs  Lab 09/27/20 1330 09/28/20 0327  AST 16  --   ALT 18  --   ALKPHOS 54  --   BILITOT 0.5  --   PROT 3.3*  --   ALBUMIN <1.0* 1.5*   Recent Labs  Lab 09/27/20 1758  LIPASE 51   No results for input(s): AMMONIA in the last 168 hours. Coagulation Profile: No results for input(s): INR, PROTIME in the last 168 hours. Cardiac Enzymes: No results for input(s): CKTOTAL, CKMB, CKMBINDEX, TROPONINI in the last 168 hours. BNP (last 3 results) No results for input(s): PROBNP in the last 8760 hours. HbA1C: No results for input(s): HGBA1C in the last 72 hours. CBG: No results for input(s): GLUCAP in the last 168 hours. Lipid Profile: No results for input(s): CHOL, HDL, LDLCALC, TRIG, CHOLHDL, LDLDIRECT in the last 72 hours. Thyroid Function Tests: No results for input(s): TSH, T4TOTAL, FREET4, T3FREE, THYROIDAB in the last 72 hours. Anemia Panel: No results for input(s): VITAMINB12, FOLATE, FERRITIN, TIBC, IRON, RETICCTPCT in the last 72 hours. Sepsis Labs: No results for input(s): PROCALCITON, LATICACIDVEN in the last 168 hours.  Recent Results (from the past 240 hour(s))  Resp Panel by RT-PCR (Flu A&B, Covid) Nasopharyngeal Swab     Status: None   Collection Time: 09/27/20  1:39 PM   Specimen: Nasopharyngeal Swab; Nasopharyngeal(NP) swabs in vial transport medium  Result Value Ref Range Status   SARS Coronavirus 2 by RT PCR NEGATIVE NEGATIVE Final    Comment: (NOTE) SARS-CoV-2 target nucleic acids are NOT DETECTED.  The SARS-CoV-2 RNA is generally detectable in upper respiratory specimens during the acute phase of infection. The lowest concentration of SARS-CoV-2 viral copies this assay can detect is 138 copies/mL. A negative result does not preclude SARS-Cov-2 infection and should not be used as the sole basis for treatment or other patient management decisions. A negative result may occur with  improper specimen  collection/handling, submission of specimen other than nasopharyngeal swab, presence of viral mutation(s) within the areas targeted by this assay, and inadequate number of viral copies(<138 copies/mL). A negative result must be combined with clinical observations, patient history, and epidemiological information. The expected result is Negative.  Fact Sheet for Patients:  EntrepreneurPulse.com.au  Fact Sheet for Healthcare Providers:  IncredibleEmployment.be  This test is no t yet approved or cleared by the [place] FDA and  has been authorized for detection and/or diagnosis of SARS-CoV-2 by FDA under an Emergency Use Authorization (EUA). This EUA will remain  in effect (meaning this test can be used) for the duration of the COVID-19 declaration under Section 564(b)(1) of the Act, 21 U.S.C.section 360bbb-3(b)(1), unless the authorization is terminated  or revoked sooner.       Influenza A  by PCR NEGATIVE NEGATIVE Final   Influenza B by PCR NEGATIVE NEGATIVE Final    Comment: (NOTE) The Xpert Xpress SARS-CoV-2/FLU/RSV plus assay is intended as an aid in the diagnosis of influenza from Nasopharyngeal swab specimens and should not be used as a sole basis for treatment. Nasal washings and aspirates are unacceptable for Xpert Xpress SARS-CoV-2/FLU/RSV testing.  Fact Sheet for Patients: EntrepreneurPulse.com.au  Fact Sheet for Healthcare Providers: IncredibleEmployment.be  This test is not yet approved or cleared by the [place] FDA and has been authorized for detection and/or diagnosis of SARS-CoV-2 by FDA under an Emergency Use Authorization (EUA). This EUA will remain in effect (meaning this test can be used) for the duration of the COVID-19 declaration under Section 564(b)(1) of the Act, 21 U.S.C. section 360bbb-3(b)(1), unless the authorization is terminated or revoked.  Performed at [facility], [place] [address] [place],  [address]       Radiology Studies: US Renal  Result Date: 09/27/2020 CLINICAL DATA:  Renal failure. EXAM: RENAL / URINARY TRACT ULTRASOUND COMPLETE COMPARISON:  CT abdomen pelvis August 27, 2017 FINDINGS: Right Kidney: Renal measurements: 11.5 x 6.5 x 5.3 cm = volume: 205 mL. Echogenicity within normal limits. No mass or hydronephrosis visualized. Left Kidney: Renal measurements: 13.5 x 5.8 x 5.8 cm = volume: 240 mL. Echogenicity within normal limits. No mass or hydronephrosis visualized. Bladder: There is bladder wall thickening measuring up to 4 mm with layering debris in the bladder. Other: Trace abdominopelvic ascites. IMPRESSION: 1. No hydronephrosis 2. Bladder wall thickening with layering debris in the bladder. Correlate with urinalysis. 3. Trace abdominopelvic ascites. Electronically Signed   By: Dahlia Bailiff MD   On: 09/27/2020 15:52   DG Chest Portable 1 View  Result Date: 09/27/2020 CLINICAL DATA:  Swelling of the face and neck. Question fluid overload. EXAM: PORTABLE CHEST 1 VIEW COMPARISON:  01/27/2020 FINDINGS: Heart size upper limits of normal. Mediastinal shadows are normal. Mild blunting of the lateral inferior costophrenic angle on the left could be a small amount of pleural fluid or pleural scarring from previous abnormality in 2021. Consider two-view chest radiography when able. No pulmonary edema. IMPRESSION: Mild blunting of the lateral inferior costophrenic angle on the left could be a small amount of pleural fluid or pleural scarring from previous abnormality in 2021. Consider two-view chest radiography when able. Electronically Signed   By: Nelson Chimes M.D.   On: 09/27/2020 14:46      Scheduled Meds: . amLODipine  10 mg Oral Daily  . aspirin  81 mg Oral Daily  . furosemide  80 mg Intravenous Q8H  . heparin  5,000 Units Subcutaneous Q8H  . pantoprazole  40 mg Oral Daily   Continuous Infusions: . sodium  chloride 250 mL (09/28/20 0036)  . albumin human 12.5 g (09/28/20 0739)     LOS: 1 day      Time spent: 25 minutes   Dessa Phi, DO [facility] 09/28/2020, 12:07 PM   Available via Epic secure chat 7am-7pm After these hours, please refer to coverage provider listed on [email or web address]

## 2020-09-28 NOTE — Progress Notes (Signed)
[facility] Progress Note  Subjective: feeling a little less swollen today . Creat down 2.7 this am.   Vitals:   09/27/20 2117 09/28/20 0136 09/28/20 0519 09/28/20 1406  BP: (!) 138/95 135/80 128/86 (!) 131/94  Pulse: 77 62 67 76  Resp: '18 15 17 18  '$ Temp: 98.7 F (37.1 C) 97.9 F (36.6 C) 98 F (36.7 C) 98.6 F (37 C)  TempSrc: Oral Oral Oral Oral  SpO2: 99% 100% 99% 100%  Weight:      Height:        Exam: Gen alert, no distress, facial edema+ improving No cyanosis or gangrene Sclera anicteric, throat clear  No jvd or bruits Chest clear bilat to bases, no rales/ wheezing RRR no MRG Abd soft ntnd no mass or ascites +bs GU normal MS no joint effusions or deformity Ext diffuse bilat LE edema 2+ , lower leg rash bilat w/o purulence  Neuro is alert, Ox 3 , nf      Home meds:  - norvasc 10/ demadex 100 qd  - asa 81/ protonix 40 qd  - prn's/ vitamins/ supplements    CXR - no active disease   Last UA 01/28/20 - nazy, >300 prot, many bact, 0-5 rbc/ wbc   Last UPC ratio 01/28/20 - 10.63   Na 137  K 4.6  CO2 26  BUN 45  Cr 3.10  Alb < 1.0  Tprot 3.3   WBC 13K Hb 10.7  plt 437    Assessment/ Plan: 1. Renal failure - b/l creat around 0.9- 1.0 from mid 2021. 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  He was lost to f/u and stopped taking his prednisone/ diuretics in Dec 2021. Here w/ Philip Trevino 3.2. could have progression of his renal condition (FSGS) vs ATN / hemodynamic related to severe neph syndrome. Admitted for diuresis. Also will initiate acei Rx while in house, see if will tolerate. No immunosuppression at this time. Will need close f/u after DC w/ Dr Hollie Salk. Will follow.   2. Vol overload / edema - in pt w/ nephrotic syndrome, very low serum albumin. Will ^lasix to 120 mg IV tid.  3. HTN - resume home meds (norvasc) as needed     Philip Trevino 09/28/2020, 4:45 PM   Recent Labs  Lab 09/27/20 1330 09/28/20 0327  K 4.6 4.7   4.7  BUN 45* 48*  45*  CREATININE 3.10* 2.53*  2.66*  CALCIUM 7.3* 7.2*  7.3*  PHOS  --  5.4*  HGB 10.7* 10.5*   Inpatient medications: . amLODipine  10 mg Oral Daily  . aspirin  81 mg Oral Daily  . furosemide  80 mg Intravenous Q8H  . heparin  5,000 Units Subcutaneous Q8H  . pantoprazole  40 mg Oral Daily   . sodium chloride 250 mL (09/28/20 0036)  . albumin human 12.5 g (09/28/20 1542)   sodium chloride, acetaminophen **OR** acetaminophen

## 2020-09-29 LAB — RENAL FUNCTION PANEL
Albumin: 1.7 g/dL — ABNORMAL LOW (ref 3.5–5.0)
Anion gap: 7 (ref 5–15)
BUN: 44 mg/dL — ABNORMAL HIGH (ref 6–20)
CO2: 25 mmol/L (ref 22–32)
Calcium: 7.3 mg/dL — ABNORMAL LOW (ref 8.9–10.3)
Chloride: 106 mmol/L (ref 98–111)
Creatinine, Ser: 2.09 mg/dL — ABNORMAL HIGH (ref 0.61–1.24)
GFR, Estimated: 45 mL/min — ABNORMAL LOW (ref >60)
Glucose, Bld: 92 mg/dL (ref 70–99)
Phosphorus: 5.2 mg/dL — ABNORMAL HIGH (ref 2.5–4.6)
Potassium: 4 mmol/L (ref 3.5–5.1)
Sodium: 138 mmol/L (ref 135–145)

## 2020-09-29 LAB — LIPID PANEL
Cholesterol: 494 mg/dL — ABNORMAL HIGH (ref 0–200)
HDL: 25 mg/dL — ABNORMAL LOW (ref >40)
LDL Cholesterol: 415 mg/dL — ABNORMAL HIGH (ref 0–99)
Total CHOL/HDL Ratio: 19.8 RATIO
Triglycerides: 268 mg/dL — ABNORMAL HIGH (ref <150)
VLDL: 54 mg/dL — ABNORMAL HIGH (ref 0–40)

## 2020-09-29 MED ORDER — ATORVASTATIN CALCIUM 40 MG PO TABS
80.0000 mg | ORAL_TABLET | Freq: Every day | ORAL | Status: DC
  Administered 2020-09-29 – 2020-10-03 (×5): 80 mg via ORAL
  Filled 2020-09-29 (×5): qty 2

## 2020-09-29 MED ORDER — EZETIMIBE 10 MG PO TABS
10.0000 mg | ORAL_TABLET | Freq: Every day | ORAL | Status: DC
  Administered 2020-09-29 – 2020-10-03 (×5): 10 mg via ORAL
  Filled 2020-09-29 (×5): qty 1

## 2020-09-29 MED ORDER — METOLAZONE 5 MG PO TABS
5.0000 mg | ORAL_TABLET | Freq: Two times a day (BID) | ORAL | Status: DC
  Administered 2020-09-29 – 2020-10-03 (×8): 5 mg via ORAL
  Filled 2020-09-29 (×8): qty 1

## 2020-09-29 MED ORDER — FUROSEMIDE 10 MG/ML IJ SOLN
160.0000 mg | Freq: Three times a day (TID) | INTRAVENOUS | Status: DC
  Administered 2020-09-29 – 2020-10-01 (×6): 160 mg via INTRAVENOUS
  Filled 2020-09-29: qty 2
  Filled 2020-09-29: qty 16
  Filled 2020-09-29: qty 10
  Filled 2020-09-29 (×3): qty 16

## 2020-09-29 NOTE — Progress Notes (Signed)
Philip Trevino Progress Note  Subjective:   2000 cc uOP yest, 1 L in . Still swollen. Creat down 2.0 !   Vitals:   09/28/20 2130 09/29/20 0500 09/29/20 0533 09/29/20 1343  BP: (!) 144/87  (!) 132/94 137/86  Pulse: 75  69 71  Resp: '17  16 17  '$ Temp: 98.4 F (36.9 C)  97.9 F (36.6 C) 99.4 F (37.4 C)  TempSrc: Oral  Oral Oral  SpO2: 100%  98% 99%  Weight:  84.4 kg    Height:        Exam: Gen alert, facial edema a bit better No jvd or bruits Chest clear bilat to bases RRR no MRG Abd soft ntnd no mass or ascites +bs Ext diffuse bilat LE edema 2+ , lower leg rash looks better Neuro is alert, Ox 3 , nf      Home meds:  - norvasc 10/ demadex 100 qd  - asa 81/ protonix 40 qd  - prn's/ vitamins/ supplements    CXR - no active disease   Last UA 01/28/20 - nazy, >300 prot, many bact, 0-5 rbc/ wbc   Last UPC ratio 01/28/20 - 10.63   Na 137  K 4.6  CO2 26  BUN 45  Cr 3.10  Alb < 1.0  Tprot 3.3   WBC 13K Hb 10.7  plt 437    Assessment/ Plan: 1. Renal failure - b/l creat around 0.9- 1.0 from mid 2021. 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  He was lost to f/u and stopped taking his prednisone/ diuretics in Dec 2021. Here w/ Philip Trevino 3.2. AKI due to progression of renal condition (FSGS) vs ATN or hemodynamic. Creat improving here, down to 2.5 yest and 2.0 today. Started acei here on 3/09, tolerating well. Gradually escalating lasix , will add po metolazone today also. Cont diuresis.  2. Hyperlipidemia - assoc w/ neph syndrome, appreciate pmd attention to this.  3. Vol overload / edema - in pt w/ nephrotic syndrome, very low serum albumin. Will ^lasix to 160 mg IV tid, add zaroxolyn.  4. HTN - resume home meds (norvasc) as needed     Philip Trevino 09/29/2020, 3:33 PM   Recent Labs  Lab 09/27/20 1330 09/28/20 0327 09/29/20 0259  K 4.6 4.7  4.7 4.0  BUN 45* 48*  45* 44*  CREATININE 3.10* 2.53*  2.66* 2.09*  CALCIUM 7.3* 7.2*   7.3* 7.3*  PHOS  --  5.4* 5.2*  HGB 10.7* 10.5*  --    Inpatient medications: . amLODipine  10 mg Oral Daily  . aspirin  81 mg Oral Daily  . atorvastatin  80 mg Oral Daily  . ezetimibe  10 mg Oral Daily  . heparin  5,000 Units Subcutaneous Q8H  . lisinopril  10 mg Oral Daily  . metolazone  5 mg Oral BID  . pantoprazole  40 mg Oral Daily   . sodium chloride 250 mL (09/28/20 0036)  . furosemide     sodium chloride, acetaminophen **OR** acetaminophen

## 2020-09-29 NOTE — Progress Notes (Signed)
PROGRESS NOTE    Philip Trevino  [id number] DOB: 23-Jan-1996 DOA: 09/27/2020 PCP: Clent Demark, PA-C     Brief Narrative:  Philip Trevino is a 25 year old male with past medical history of nephrotic syndrome (minimal-change disease and/or FSGS), medication noncompliance who presented to the ED for evaluation of leg and facial swelling which has worsened over the past week.  Has had bilateral lower extremity swelling for several months which has been intermittent however for the past month the swelling has been more persistent in the last week has involved his face. Has not been taking his medications for the past 3 months due to financial reasons.  Does not have issues with eating or breathing but states that he feels like he is "lightly being choked."  New events last 24 hours / Subjective: Continues to have some improvement in swelling.  No new complaints.  Assessment & Plan:   Principal Problem:   AKI (acute kidney injury) ([place]) Active Problems:   Anasarca   Nephrotic syndrome   Benign essential HTN   AKI in setting of nephrotic syndrome, FSGS and/or minimal-change disease -Baseline creatinine around 0.9-1.0 -Appreciate nephrology -Continue IV Lasix, IV albumin -Creatinine improving  Anasarca secondary to nephrotic syndrome -Continue IV Lasix, IV albumin -Daily weight and strict I's and O's  Hyperlipidemia -Total cholesterol 494, LDL 415, triglycerides 268 -Start high intensity statin therapy, Lipitor 80 mg as well as Zetia.  Recommend repeat lipid profile in 6 weeks as an outpatient.  Could consider Repatha as an outpatient.  Hypertension -Continue Norvasc, Lasix   DVT prophylaxis:  heparin injection 5,000 Units Start: 09/27/20 2200  Code Status: Full code Family Communication: No family at bedside Disposition Plan:  Status is: Inpatient  Remains inpatient appropriate because:IV treatments appropriate due to intensity of illness or inability to take  PO   Dispo: The patient is from: Home              Anticipated d/c is to: Home              Patient currently is not medically stable to d/c.  Remains on IV Lasix   Difficult to place patient No      Consultants:   Nephrology  Procedures:   None  Antimicrobials:  Anti-infectives (From admission, onward)   None       Objective: Vitals:   09/28/20 1406 09/28/20 2130 09/29/20 0500 09/29/20 0533  BP: (!) 131/94 (!) 144/87  (!) 132/94  Pulse: 76 75  69  Resp: '18 17  16  '$ Temp: 98.6 F (37 C) 98.4 F (36.9 C)  97.9 F (36.6 C)  TempSrc: Oral Oral  Oral  SpO2: 100% 100%  98%  Weight:   84.4 kg   Height:        Intake/Output Summary (Last 24 hours) at 09/29/2020 1226 Last data filed at 09/29/2020 0600 Gross per 24 hour  Intake 874.99 ml  Output 1800 ml  Net -925.01 ml   Filed Weights   09/27/20 1248 09/29/20 0500  Weight: 68 kg 84.4 kg   Examination: General exam: Appears calm and comfortable, + facial edema Respiratory system: Clear to auscultation. Respiratory effort normal. Cardiovascular system: S1 & S2 heard, RRR. + Bilateral pedal edema. Gastrointestinal system: Abdomen is nondistended, soft and nontender. Normal bowel sounds heard. Central nervous system: Alert and oriented. Non focal exam. Speech clear  Extremities: Symmetric in appearance bilaterally  Skin: No rashes, lesions or ulcers on exposed skin  Psychiatry: Judgement and insight appear  stable. Mood & affect appropriate.    Data Reviewed: I have personally reviewed following labs and imaging studies  CBC: Recent Labs  Lab 09/27/20 1330 09/28/20 0327  WBC 13.8* 9.7  NEUTROABS 9.4*  --   HGB 10.7* 10.5*  HCT 31.7* 31.0*  MCV 86.8 87.1  PLT 437* 0000000   Basic Metabolic Panel: Recent Labs  Lab 09/27/20 1330 09/28/20 0327 09/29/20 0259  NA 137 137  137 138  K 4.6 4.7  4.7 4.0  CL 108 107  107 106  CO2 '26 25  26 25  '$ GLUCOSE 89 90  91 92  BUN 45* 48*  45* 44*  CREATININE  3.10* 2.53*  2.66* 2.09*  CALCIUM 7.3* 7.2*  7.3* 7.3*  PHOS  --  5.4* 5.2*   GFR: Estimated Creatinine Clearance: 55 mL/min (A) (by C-G formula based on SCr of 2.09 mg/dL (H)). Liver Function Tests: Recent Labs  Lab 09/27/20 1330 09/28/20 0327 09/29/20 0259  AST 16  --   --   ALT 18  --   --   ALKPHOS 54  --   --   BILITOT 0.5  --   --   PROT 3.3*  --   --   ALBUMIN <1.0* 1.5* 1.7*   Recent Labs  Lab 09/27/20 1758  LIPASE 51   No results for input(s): AMMONIA in the last 168 hours. Coagulation Profile: No results for input(s): INR, PROTIME in the last 168 hours. Cardiac Enzymes: No results for input(s): CKTOTAL, CKMB, CKMBINDEX, TROPONINI in the last 168 hours. BNP (last 3 results) No results for input(s): PROBNP in the last 8760 hours. HbA1C: No results for input(s): HGBA1C in the last 72 hours. CBG: No results for input(s): GLUCAP in the last 168 hours. Lipid Profile: Recent Labs    09/29/20 0259  CHOL 494*  HDL 25*  LDLCALC 415*  TRIG 268*  CHOLHDL 19.8   Thyroid Function Tests: No results for input(s): TSH, T4TOTAL, FREET4, T3FREE, THYROIDAB in the last 72 hours. Anemia Panel: No results for input(s): VITAMINB12, FOLATE, FERRITIN, TIBC, IRON, RETICCTPCT in the last 72 hours. Sepsis Labs: No results for input(s): PROCALCITON, LATICACIDVEN in the last 168 hours.  Recent Results (from the past 240 hour(s))  Resp Panel by RT-PCR (Flu A&B, Covid) Nasopharyngeal Swab     Status: None   Collection Time: 09/27/20  1:39 PM   Specimen: Nasopharyngeal Swab; Nasopharyngeal(NP) swabs in vial transport medium  Result Value Ref Range Status   SARS Coronavirus 2 by RT PCR NEGATIVE NEGATIVE Final    Comment: (NOTE) SARS-CoV-2 target nucleic acids are NOT DETECTED.  The SARS-CoV-2 RNA is generally detectable in upper respiratory specimens during the acute phase of infection. The lowest concentration of SARS-CoV-2 viral copies this assay can detect is 138  copies/mL. A negative result does not preclude SARS-Cov-2 infection and should not be used as the sole basis for treatment or other patient management decisions. A negative result may occur with  improper specimen collection/handling, submission of specimen other than nasopharyngeal swab, presence of viral mutation(s) within the areas targeted by this assay, and inadequate number of viral copies(<138 copies/mL). A negative result must be combined with clinical observations, patient history, and epidemiological information. The expected result is Negative.  Fact Sheet for Patients:  EntrepreneurPulse.com.au  Fact Sheet for Healthcare Providers:  IncredibleEmployment.be  This test is no t yet approved or cleared by the [place] FDA and  has been authorized for detection and/or diagnosis of SARS-CoV-2 by  FDA under an Emergency Use Authorization (EUA). This EUA will remain  in effect (meaning this test can be used) for the duration of the COVID-19 declaration under Section 564(b)(1) of the Act, 21 U.S.C.section 360bbb-3(b)(1), unless the authorization is terminated  or revoked sooner.       Influenza A by PCR NEGATIVE NEGATIVE Final   Influenza B by PCR NEGATIVE NEGATIVE Final    Comment: (NOTE) The Xpert Xpress SARS-CoV-2/FLU/RSV plus assay is intended as an aid in the diagnosis of influenza from Nasopharyngeal swab specimens and should not be used as a sole basis for treatment. Nasal washings and aspirates are unacceptable for Xpert Xpress SARS-CoV-2/FLU/RSV testing.  Fact Sheet for Patients: EntrepreneurPulse.com.au  Fact Sheet for Healthcare Providers: IncredibleEmployment.be  This test is not yet approved or cleared by the [place] FDA and has been authorized for detection and/or diagnosis of SARS-CoV-2 by FDA under an Emergency Use Authorization (EUA). This EUA will remain in effect (meaning  this test can be used) for the duration of the COVID-19 declaration under Section 564(b)(1) of the Act, 21 U.S.C. section 360bbb-3(b)(1), unless the authorization is terminated or revoked.  Performed at [facility], [place] [address], [place], [place] [address]       Radiology Studies: US Renal  Result Date: 09/27/2020 CLINICAL DATA:  Renal failure. EXAM: RENAL / URINARY TRACT ULTRASOUND COMPLETE COMPARISON:  CT abdomen pelvis August 27, 2017 FINDINGS: Right Kidney: Renal measurements: 11.5 x 6.5 x 5.3 cm = volume: 205 mL. Echogenicity within normal limits. No mass or hydronephrosis visualized. Left Kidney: Renal measurements: 13.5 x 5.8 x 5.8 cm = volume: 240 mL. Echogenicity within normal limits. No mass or hydronephrosis visualized. Bladder: There is bladder wall thickening measuring up to 4 mm with layering debris in the bladder. Other: Trace abdominopelvic ascites. IMPRESSION: 1. No hydronephrosis 2. Bladder wall thickening with layering debris in the bladder. Correlate with urinalysis. 3. Trace abdominopelvic ascites. Electronically Signed   By: Dahlia Bailiff MD   On: 09/27/2020 15:52   DG Chest Portable 1 View  Result Date: 09/27/2020 CLINICAL DATA:  Swelling of the face and neck. Question fluid overload. EXAM: PORTABLE CHEST 1 VIEW COMPARISON:  01/27/2020 FINDINGS: Heart size upper limits of normal. Mediastinal shadows are normal. Mild blunting of the lateral inferior costophrenic angle on the left could be a small amount of pleural fluid or pleural scarring from previous abnormality in 2021. Consider two-view chest radiography when able. No pulmonary edema. IMPRESSION: Mild blunting of the lateral inferior costophrenic angle on the left could be a small amount of pleural fluid or pleural scarring from previous abnormality in 2021. Consider two-view chest radiography when able. Electronically Signed   By: Nelson Chimes M.D.   On: 09/27/2020 14:46      Scheduled Meds: .  amLODipine  10 mg Oral Daily  . aspirin  81 mg Oral Daily  . atorvastatin  80 mg Oral Daily  . ezetimibe  10 mg Oral Daily  . heparin  5,000 Units Subcutaneous Q8H  . lisinopril  10 mg Oral Daily  . metolazone  5 mg Oral BID  . pantoprazole  40 mg Oral Daily   Continuous Infusions: . sodium chloride 250 mL (09/28/20 0036)  . furosemide       LOS: 2 days      Time spent: 25 minutes   Dessa Phi, DO [facility] 09/29/2020, 12:26 PM   Available via Epic secure chat 7am-7pm After these hours, please refer to  coverage provider listed on [email or web address]

## 2020-09-29 NOTE — TOC Progression Note (Signed)
Transition of Care [facility]) - Progression Note    Patient Details  Name: Philip Trevino MRN: [id number] Date of Birth: 1995/12/19  [facility] CM/SW Contact  Lennart Pall, LCSW Phone Number: 09/29/2020, 3:22 PM  Clinical Narrative:    Follow up with pt today and he confirms that he met with financial counseling yesterday and they have begun a medicaid application (MAF) for him.  Also,reminded pt that he is able to access [facility] and [facility] for medications.  *[facility] pharm not open on the weekends unfortunately and it does not appear pt is eligible for the [facility] program again yet (used in July 2021).  If dc over weekend, may have to pay OOP for meds but, longer term plan should be to have them filled at [facility].   Expected Discharge Plan: Home/Self Care Barriers to Discharge: Continued Medical Work up,Inadequate or no insurance  Expected Discharge Plan and Services Expected Discharge Plan: Home/Self Care In-house Referral: Clinical Social Work     Living arrangements for the past 2 months: Single Family Home                                       Social Determinants of Health (SDOH) Interventions    Readmission Risk Interventions Readmission Risk Prevention Plan 09/28/2020  Post Dischage Appt Complete  Medication Screening Complete  Transportation Screening Complete  Some recent data might be hidden

## 2020-09-30 LAB — RENAL FUNCTION PANEL
Albumin: 1.5 g/dL — ABNORMAL LOW (ref 3.5–5.0)
Anion gap: 7 (ref 5–15)
BUN: 37 mg/dL — ABNORMAL HIGH (ref 6–20)
CO2: 27 mmol/L (ref 22–32)
Calcium: 7.4 mg/dL — ABNORMAL LOW (ref 8.9–10.3)
Chloride: 104 mmol/L (ref 98–111)
Creatinine, Ser: 1.73 mg/dL — ABNORMAL HIGH (ref 0.61–1.24)
GFR, Estimated: 56 mL/min — ABNORMAL LOW (ref >60)
Glucose, Bld: 106 mg/dL — ABNORMAL HIGH (ref 70–99)
Phosphorus: 4.7 mg/dL — ABNORMAL HIGH (ref 2.5–4.6)
Potassium: 3.5 mmol/L (ref 3.5–5.1)
Sodium: 138 mmol/L (ref 135–145)

## 2020-09-30 NOTE — Progress Notes (Signed)
[facility] Progress Note  Subjective:  1.9 L UOP yest, 1.1 L in . 1200 today so far.    Vitals:   09/29/20 2142 09/30/20 0500 09/30/20 0545 09/30/20 0756  BP: 126/81  121/90 125/90  Pulse: 67  61   Resp: 16  16   Temp: 99.2 F (37.3 C)  97.8 F (36.6 C)   TempSrc: Oral  Oral   SpO2: 97%  99%   Weight:  82.1 kg    Height:        Exam: Gen alert, facial edema improving No jvd or bruits Chest clear bilat to bases RRR no MRG Abd soft ntnd no mass or ascites +bs Ext diffuse bilat LE edema 2+, improving  Neuro is alert, Ox 3 , nf      Home meds:  - norvasc 10/ demadex 100 qd  - asa 81/ protonix 40 qd  - prn's/ vitamins/ supplements    CXR - no active disease   Last UA 01/28/20 - nazy, >300 prot, many bact, 0-5 rbc/ wbc   Last UPC ratio 01/28/20 - 10.63   Na 137  K 4.6  CO2 26  BUN 45  Cr 3.10  Alb < 1.0  Tprot 3.3   WBC 13K Hb 10.7  plt 437    Assessment/ Plan: 1. Renal failure - b/l creat around 0.9- 1.0 from mid 2021. 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  He was lost to f/u and stopped taking his prednisone/ diuretics in Dec 2021. Here w/ Philip Trevino 3.2. AKI less likely progression of disease. Creat improving here, down to 1.7 today. Probable hemodynamic component d/t vol overload, improving w/ diuresis. Started acei here on 3/09, tolerating well. Diuresing but still has large amt of edema. Cont diuresis.  2. Hyperlipidemia - assoc w/ neph syndrome, statin per primary team 3. Vol overload / edema - in pt w/ nephrotic syndrome, very low serum albumin. Cont ^lasix to 160 mg IV tid + zaroxolyn.  4. HTN - resume home meds (norvasc) as needed     Philip Trevino 09/30/2020, 12:20 PM   Recent Labs  Lab 09/27/20 1330 09/27/20 1330 09/28/20 0327 09/29/20 0259 09/30/20 0241  K 4.6  --  4.7  4.7 4.0 3.5  BUN 45*  --  48*  45* 44* 37*  CREATININE 3.10*  --  2.53*  2.66* 2.09* 1.73*  CALCIUM 7.3*  --  7.2*  7.3* 7.3* 7.4*   PHOS  --    < > 5.4* 5.2* 4.7*  HGB 10.7*  --  10.5*  --   --    < > = values in this interval not displayed.   Inpatient medications: . amLODipine  10 mg Oral Daily  . aspirin  81 mg Oral Daily  . atorvastatin  80 mg Oral Daily  . ezetimibe  10 mg Oral Daily  . heparin  5,000 Units Subcutaneous Q8H  . lisinopril  10 mg Oral Daily  . metolazone  5 mg Oral BID  . pantoprazole  40 mg Oral Daily   . sodium chloride 250 mL (09/28/20 0036)  . furosemide 160 mg (09/30/20 0800)   sodium chloride, acetaminophen **OR** acetaminophen

## 2020-09-30 NOTE — Progress Notes (Signed)
PROGRESS NOTE    Philip Trevino  [id number] DOB: 05/03/1996 DOA: 09/27/2020 PCP: Clent Demark, PA-C     Brief Narrative:  Philip Trevino is a 25 year old male with past medical history of nephrotic syndrome (minimal-change disease and/or FSGS), medication noncompliance who presented to the ED for evaluation of leg and facial swelling which has worsened over the past week.  Has had bilateral lower extremity swelling for several months which has been intermittent however for the past month the swelling has been more persistent in the last week has involved his face. Has not been taking his medications for the past 3 months due to financial reasons.  Does not have issues with eating or breathing but states that he feels like he is "lightly being choked."  New events last 24 hours / Subjective: No new physical complaints this morning, continues to diurese well, recorded 1.5 L urine output last 24 hours.  Creatinine continues to improve.  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  Recommend repeat lipid profile in 6 weeks as an outpatient.  Could consider Repatha as an outpatient.  Hypertension -Continue Norvasc, Lasix   DVT prophylaxis:  heparin injection 5,000 Units Start: 09/27/20 2200  Code Status: Full code Family Communication: No family at bedside Disposition Plan:  Status is: Inpatient  Remains inpatient appropriate because:IV  treatments appropriate due to intensity of illness or inability to take PO   Dispo: The patient is from: Home              Anticipated d/c is to: Home              Patient currently is not medically stable to d/c.  Remains on IV Lasix   Difficult to place patient No      Consultants:   Nephrology  Procedures:   None  Antimicrobials:  Anti-infectives (From admission, onward)   None       Objective: Vitals:   09/29/20 2142 09/30/20 0500 09/30/20 0545 09/30/20 0756  BP: 126/81  121/90 125/90  Pulse: 67  61   Resp: 16  16   Temp: 99.2 F (37.3 C)  97.8 F (36.6 C)   TempSrc: Oral  Oral   SpO2: 97%  99%   Weight:  82.1 kg    Height:        Intake/Output Summary (Last 24 hours) at 09/30/2020 1046 Last data filed at 09/30/2020 0913 Gross per 24 hour  Intake 1256.46 ml  Output 2390 ml  Net -1133.54 ml   Filed Weights   09/27/20 1248 09/29/20 0500 09/30/20 0500  Weight: 68 kg 84.4 kg 82.1 kg    Examination: General exam: Appears calm and comfortable, + facial edema present Respiratory system: Clear to auscultation. Respiratory effort normal. Cardiovascular system: S1 & S2 heard, RRR. +pedal edema. Gastrointestinal system: Abdomen is nondistended, soft and nontender. Normal bowel sounds heard. Central nervous system: Alert and oriented. Non focal exam. Speech clear  Extremities: Symmetric in appearance bilaterally  Skin: No rashes, lesions or  ulcers on exposed skin  Psychiatry: Judgement and insight appear stable. Mood & affect appropriate.    Data Reviewed: I have personally reviewed following labs and imaging studies  CBC: Recent Labs  Lab 09/27/20 1330 09/28/20 0327  WBC 13.8* 9.7  NEUTROABS 9.4*  --   HGB 10.7* 10.5*  HCT 31.7* 31.0*  MCV 86.8 87.1  PLT 437* 0000000   Basic Metabolic Panel: Recent Labs  Lab 09/27/20 1330 09/28/20 0327 09/29/20 0259 09/30/20 0241  NA 137 137  137 138 138  K 4.6 4.7  4.7 4.0 3.5  CL 108 107  107 106 104   CO2 '26 25  26 25 27  '$ GLUCOSE 89 90  91 92 106*  BUN 45* 48*  45* 44* 37*  CREATININE 3.10* 2.53*  2.66* 2.09* 1.73*  CALCIUM 7.3* 7.2*  7.3* 7.3* 7.4*  PHOS  --  5.4* 5.2* 4.7*   GFR: Estimated Creatinine Clearance: 66.4 mL/min (A) (by C-G formula based on SCr of 1.73 mg/dL (H)). Liver Function Tests: Recent Labs  Lab 09/27/20 1330 09/28/20 0327 09/29/20 0259 09/30/20 0241  AST 16  --   --   --   ALT 18  --   --   --   ALKPHOS 54  --   --   --   BILITOT 0.5  --   --   --   PROT 3.3*  --   --   --   ALBUMIN <1.0* 1.5* 1.7* 1.5*   Recent Labs  Lab 09/27/20 1758  LIPASE 51   No results for input(s): AMMONIA in the last 168 hours. Coagulation Profile: No results for input(s): INR, PROTIME in the last 168 hours. Cardiac Enzymes: No results for input(s): CKTOTAL, CKMB, CKMBINDEX, TROPONINI in the last 168 hours. BNP (last 3 results) No results for input(s): PROBNP in the last 8760 hours. HbA1C: No results for input(s): HGBA1C in the last 72 hours. CBG: No results for input(s): GLUCAP in the last 168 hours. Lipid Profile: Recent Labs    09/29/20 0259  CHOL 494*  HDL 25*  LDLCALC 415*  TRIG 268*  CHOLHDL 19.8   Thyroid Function Tests: No results for input(s): TSH, T4TOTAL, FREET4, T3FREE, THYROIDAB in the last 72 hours. Anemia Panel: No results for input(s): VITAMINB12, FOLATE, FERRITIN, TIBC, IRON, RETICCTPCT in the last 72 hours. Sepsis Labs: No results for input(s): PROCALCITON, LATICACIDVEN in the last 168 hours.  Recent Results (from the past 240 hour(s))  Resp Panel by RT-PCR (Flu A&B, Covid) Nasopharyngeal Swab     Status: None   Collection Time: 09/27/20  1:39 PM   Specimen: Nasopharyngeal Swab; Nasopharyngeal(NP) swabs in vial transport medium  Result Value Ref Range Status   SARS Coronavirus 2 by RT PCR NEGATIVE NEGATIVE Final    Comment: (NOTE) SARS-CoV-2 target nucleic acids are NOT DETECTED.  The SARS-CoV-2 RNA is generally detectable in  upper respiratory specimens during the acute phase of infection. The lowest concentration of SARS-CoV-2 viral copies this assay can detect is 138 copies/mL. A negative result does not preclude SARS-Cov-2 infection and should not be used as the sole basis for treatment or other patient management decisions. A negative result may occur with  improper specimen collection/handling, submission of specimen other than nasopharyngeal swab, presence of viral mutation(s) within the areas targeted by this assay, and inadequate number of viral copies(<138 copies/mL). A negative result must be combined with clinical observations, patient history, and epidemiological information. The expected result is Negative.  Fact Sheet  for Patients:  EntrepreneurPulse.com.au  Fact Sheet for Healthcare Providers:  IncredibleEmployment.be  This test is no t yet approved or cleared by the [place] FDA and  has been authorized for detection and/or diagnosis of SARS-CoV-2 by FDA under an Emergency Use Authorization (EUA). This EUA will remain  in effect (meaning this test can be used) for the duration of the COVID-19 declaration under Section 564(b)(1) of the Act, 21 U.S.C.section 360bbb-3(b)(1), unless the authorization is terminated  or revoked sooner.       Influenza A by PCR NEGATIVE NEGATIVE Final   Influenza B by PCR NEGATIVE NEGATIVE Final    Comment: (NOTE) The Xpert Xpress SARS-CoV-2/FLU/RSV plus assay is intended as an aid in the diagnosis of influenza from Nasopharyngeal swab specimens and should not be used as a sole basis for treatment. Nasal washings and aspirates are unacceptable for Xpert Xpress SARS-CoV-2/FLU/RSV testing.  Fact Sheet for Patients: EntrepreneurPulse.com.au  Fact Sheet for Healthcare Providers: IncredibleEmployment.be  This test is not yet approved or cleared by the [place] FDA and has been  authorized for detection and/or diagnosis of SARS-CoV-2 by FDA under an Emergency Use Authorization (EUA). This EUA will remain in effect (meaning this test can be used) for the duration of the COVID-19 declaration under Section 564(b)(1) of the Act, 21 U.S.C. section 360bbb-3(b)(1), unless the authorization is terminated or revoked.  Performed at [facility], [place] [address] [place], [place] [address]       Radiology Studies: No results found.    Scheduled Meds: . amLODipine  10 mg Oral Daily  . aspirin  81 mg Oral Daily  . atorvastatin  80 mg Oral Daily  . ezetimibe  10 mg Oral Daily  . heparin  5,000 Units Subcutaneous Q8H  . lisinopril  10 mg Oral Daily  . metolazone  5 mg Oral BID  . pantoprazole  40 mg Oral Daily   Continuous Infusions: . sodium chloride 250 mL (09/28/20 0036)  . furosemide 160 mg (09/30/20 0800)     LOS: 3 days      Time spent: 20 minutes   Dessa Phi, DO [facility] 09/30/2020, 10:46 AM   Available via Epic secure chat 7am-7pm After these hours, please refer to coverage provider listed on [email or web address]

## 2020-09-30 NOTE — Plan of Care (Signed)
  Problem: Education: Goal: Knowledge of General Education information will improve Description Including pain rating scale, medication(s)/side effects and non-pharmacologic comfort measures Outcome: Progressing   

## 2020-09-30 NOTE — Plan of Care (Signed)
  Problem: Clinical Measurements: Goal: Cardiovascular complication will be avoided Outcome: Progressing   Problem: Clinical Measurements: Goal: Diagnostic test results will improve Outcome: Progressing   Problem: Education: Goal: Knowledge of General Education information will improve Description: Including pain rating scale, medication(s)/side effects and non-pharmacologic comfort measures Outcome: Progressing   Problem: Health Behavior/Discharge Planning: Goal: Ability to manage health-related needs will improve Outcome: Progressing   Problem: Activity: Goal: Risk for activity intolerance will decrease Outcome: Progressing   Problem: Nutrition: Goal: Adequate nutrition will be maintained Outcome: Progressing   Problem: Elimination: Goal: Will not experience complications related to bowel motility Outcome: Progressing   Problem: Safety: Goal: Ability to remain free from injury will improve Outcome: Progressing

## 2020-10-01 LAB — BASIC METABOLIC PANEL
Anion gap: 7 (ref 5–15)
BUN: 34 mg/dL — ABNORMAL HIGH (ref 6–20)
CO2: 28 mmol/L (ref 22–32)
Calcium: 7.5 mg/dL — ABNORMAL LOW (ref 8.9–10.3)
Chloride: 103 mmol/L (ref 98–111)
Creatinine, Ser: 1.78 mg/dL — ABNORMAL HIGH (ref 0.61–1.24)
GFR, Estimated: 54 mL/min — ABNORMAL LOW (ref >60)
Glucose, Bld: 111 mg/dL — ABNORMAL HIGH (ref 70–99)
Potassium: 3.7 mmol/L (ref 3.5–5.1)
Sodium: 138 mmol/L (ref 135–145)

## 2020-10-01 MED ORDER — FUROSEMIDE 10 MG/ML IJ SOLN
160.0000 mg | Freq: Four times a day (QID) | INTRAVENOUS | Status: DC
  Administered 2020-10-01 – 2020-10-03 (×8): 160 mg via INTRAVENOUS
  Filled 2020-10-01 (×2): qty 16
  Filled 2020-10-01 (×2): qty 10
  Filled 2020-10-01 (×2): qty 16
  Filled 2020-10-01: qty 10
  Filled 2020-10-01 (×2): qty 16

## 2020-10-01 MED ORDER — HYDROCORTISONE 1 % EX CREA
TOPICAL_CREAM | Freq: Two times a day (BID) | CUTANEOUS | Status: DC
  Filled 2020-10-01: qty 28

## 2020-10-01 NOTE — Progress Notes (Signed)
PROGRESS NOTE    Arvo Renter  [id number] DOB: 01/18/1996 DOA: 09/27/2020 PCP: Clent Demark, PA-C     Brief Narrative:  Okey Chalupa is a 25 year old male with past medical history of nephrotic syndrome (minimal-change disease and/or FSGS), medication noncompliance who presented to the ED for evaluation of leg and facial swelling which has worsened over the past week.  Has had bilateral lower extremity swelling for several months which has been intermittent however for the past month the swelling has been more persistent in the last week has involved his face. Has not been taking his medications for the past 3 months due to financial reasons.  Does not have issues with eating or breathing but states that he feels like he is "lightly being choked."  New events last 24 hours / Subjective: Continues to diurese well, recorded urine output 2.7 L last 24 hours.  States that the swelling over his legs have improved but continues to have swelling around his abdomen  Assessment & Plan:   Principal Problem:   AKI (acute kidney injury) ([place]) Active Problems:   Anasarca   Nephrotic syndrome   Benign essential HTN   AKI in setting of nephrotic syndrome, FSGS and/or minimal-change disease -Baseline creatinine around 0.9-1.0 -Appreciate nephrology -Continue IV Lasix, Zaroxolyn -Creatinine improved  Anasarca secondary to nephrotic syndrome -Continue IV Lasix, Zaroxolyn -Daily weight and strict I's and O's  Hyperlipidemia -Total cholesterol 494, LDL 415, triglycerides 268 -Started high intensity statin therapy, Lipitor 80 mg as well as Zetia.  Recommend repeat lipid profile in 6 weeks as an outpatient.  Could consider Repatha as an outpatient.  Hypertension -Continue Norvasc, Lasix, lisinopril, Zaroxolyn   DVT prophylaxis:  heparin injection 5,000 Units Start: 09/27/20 2200  Code Status: Full code Family Communication: No family at bedside Disposition Plan:  Status is:  Inpatient  Remains inpatient appropriate because:IV treatments appropriate due to intensity of illness or inability to take PO   Dispo: The patient is from: Home              Anticipated d/c is to: Home              Patient currently is not medically stable to d/c.  Remains on IV Lasix   Difficult to place patient No      Consultants:   Nephrology  Procedures:   None  Antimicrobials:  Anti-infectives (From admission, onward)   None       Objective: Vitals:   09/30/20 2140 10/01/20 0500 10/01/20 0545 10/01/20 0800  BP: 128/79  (!) 153/81 120/80  Pulse: 69  61   Resp: 16  16   Temp: 98.7 F (37.1 C)  98.2 F (36.8 C)   TempSrc: Oral     SpO2: 97%  100%   Weight:  81.9 kg    Height:        Intake/Output Summary (Last 24 hours) at 10/01/2020 1118 Last data filed at 10/01/2020 1000 Gross per 24 hour  Intake 675.84 ml  Output 2600 ml  Net -1924.16 ml   Filed Weights   09/29/20 0500 09/30/20 0500 10/01/20 0500  Weight: 84.4 kg 82.1 kg 81.9 kg    Examination: General exam: Appears calm and comfortable, + facial edema Respiratory system: Clear to auscultation. Respiratory effort normal. Cardiovascular system: S1 & S2 heard, RRR. + Bilateral pedal edema. Gastrointestinal system: Abdomen is nondistended, soft and nontender. Normal bowel sounds heard. Central nervous system: Alert and oriented. Non focal exam. Speech clear  Extremities: Symmetric  in appearance bilaterally  Skin: No rashes, lesions or ulcers on exposed skin  Psychiatry: Judgement and insight appear stable. Mood & affect appropriate.     Data Reviewed: I have personally reviewed following labs and imaging studies  CBC: Recent Labs  Lab 09/27/20 1330 09/28/20 0327  WBC 13.8* 9.7  NEUTROABS 9.4*  --   HGB 10.7* 10.5*  HCT 31.7* 31.0*  MCV 86.8 87.1  PLT 437* 0000000   Basic Metabolic Panel: Recent Labs  Lab 09/27/20 1330 09/28/20 0327 09/29/20 0259 09/30/20 0241 10/01/20 0334  NA  137 137  137 138 138 138  K 4.6 4.7  4.7 4.0 3.5 3.7  CL 108 107  107 106 104 103  CO2 '26 25  26 25 27 28  '$ GLUCOSE 89 90  91 92 106* 111*  BUN 45* 48*  45* 44* 37* 34*  CREATININE 3.10* 2.53*  2.66* 2.09* 1.73* 1.78*  CALCIUM 7.3* 7.2*  7.3* 7.3* 7.4* 7.5*  PHOS  --  5.4* 5.2* 4.7*  --    GFR: Estimated Creatinine Clearance: 64.5 mL/min (A) (by C-G formula based on SCr of 1.78 mg/dL (H)). Liver Function Tests: Recent Labs  Lab 09/27/20 1330 09/28/20 0327 09/29/20 0259 09/30/20 0241  AST 16  --   --   --   ALT 18  --   --   --   ALKPHOS 54  --   --   --   BILITOT 0.5  --   --   --   PROT 3.3*  --   --   --   ALBUMIN <1.0* 1.5* 1.7* 1.5*   Recent Labs  Lab 09/27/20 1758  LIPASE 51   No results for input(s): AMMONIA in the last 168 hours. Coagulation Profile: No results for input(s): INR, PROTIME in the last 168 hours. Cardiac Enzymes: No results for input(s): CKTOTAL, CKMB, CKMBINDEX, TROPONINI in the last 168 hours. BNP (last 3 results) No results for input(s): PROBNP in the last 8760 hours. HbA1C: No results for input(s): HGBA1C in the last 72 hours. CBG: No results for input(s): GLUCAP in the last 168 hours. Lipid Profile: Recent Labs    09/29/20 0259  CHOL 494*  HDL 25*  LDLCALC 415*  TRIG 268*  CHOLHDL 19.8   Thyroid Function Tests: No results for input(s): TSH, T4TOTAL, FREET4, T3FREE, THYROIDAB in the last 72 hours. Anemia Panel: No results for input(s): VITAMINB12, FOLATE, FERRITIN, TIBC, IRON, RETICCTPCT in the last 72 hours. Sepsis Labs: No results for input(s): PROCALCITON, LATICACIDVEN in the last 168 hours.  Recent Results (from the past 240 hour(s))  Resp Panel by RT-PCR (Flu A&B, Covid) Nasopharyngeal Swab     Status: None   Collection Time: 09/27/20  1:39 PM   Specimen: Nasopharyngeal Swab; Nasopharyngeal(NP) swabs in vial transport medium  Result Value Ref Range Status   SARS Coronavirus 2 by RT PCR NEGATIVE NEGATIVE Final     Comment: (NOTE) SARS-CoV-2 target nucleic acids are NOT DETECTED.  The SARS-CoV-2 RNA is generally detectable in upper respiratory specimens during the acute phase of infection. The lowest concentration of SARS-CoV-2 viral copies this assay can detect is 138 copies/mL. A negative result does not preclude SARS-Cov-2 infection and should not be used as the sole basis for treatment or other patient management decisions. A negative result may occur with  improper specimen collection/handling, submission of specimen other than nasopharyngeal swab, presence of viral mutation(s) within the areas targeted by this assay, and inadequate number of viral copies(<138  copies/mL). A negative result must be combined with clinical observations, patient history, and epidemiological information. The expected result is Negative.  Fact Sheet for Patients:  EntrepreneurPulse.com.au  Fact Sheet for Healthcare Providers:  IncredibleEmployment.be  This test is no t yet approved or cleared by the [place] FDA and  has been authorized for detection and/or diagnosis of SARS-CoV-2 by FDA under an Emergency Use Authorization (EUA). This EUA will remain  in effect (meaning this test can be used) for the duration of the COVID-19 declaration under Section 564(b)(1) of the Act, 21 U.S.C.section 360bbb-3(b)(1), unless the authorization is terminated  or revoked sooner.       Influenza A by PCR NEGATIVE NEGATIVE Final   Influenza B by PCR NEGATIVE NEGATIVE Final    Comment: (NOTE) The Xpert Xpress SARS-CoV-2/FLU/RSV plus assay is intended as an aid in the diagnosis of influenza from Nasopharyngeal swab specimens and should not be used as a sole basis for treatment. Nasal washings and aspirates are unacceptable for Xpert Xpress SARS-CoV-2/FLU/RSV testing.  Fact Sheet for Patients: EntrepreneurPulse.com.au  Fact Sheet for Healthcare  Providers: IncredibleEmployment.be  This test is not yet approved or cleared by the [place] FDA and has been authorized for detection and/or diagnosis of SARS-CoV-2 by FDA under an Emergency Use Authorization (EUA). This EUA will remain in effect (meaning this test can be used) for the duration of the COVID-19 declaration under Section 564(b)(1) of the Act, 21 U.S.C. section 360bbb-3(b)(1), unless the authorization is terminated or revoked.  Performed at [facility], [place] [address], [place], [place] [address]       Radiology Studies: No results found.    Scheduled Meds: . amLODipine  10 mg Oral Daily  . aspirin  81 mg Oral Daily  . atorvastatin  80 mg Oral Daily  . ezetimibe  10 mg Oral Daily  . heparin  5,000 Units Subcutaneous Q8H  . hydrocortisone cream   Topical BID  . lisinopril  10 mg Oral Daily  . metolazone  5 mg Oral BID  . pantoprazole  40 mg Oral Daily   Continuous Infusions: . sodium chloride Stopped (09/30/20 1629)  . furosemide       LOS: 4 days      Time spent: 20 minutes   Dessa Phi, DO [facility] 10/01/2020, 11:18 AM   Available via Epic secure chat 7am-7pm After these hours, please refer to coverage provider listed on [email or web address]

## 2020-10-01 NOTE — Plan of Care (Signed)
  Problem: Education: Goal: Knowledge of General Education information will improve Description: Including pain rating scale, medication(s)/side effects and non-pharmacologic comfort measures Outcome: Progressing   Problem: Health Behavior/Discharge Planning: Goal: Ability to manage health-related needs will improve Outcome: Progressing   Problem: Nutrition: Goal: Adequate nutrition will be maintained Outcome: Progressing   Problem: Coping: Goal: Level of anxiety will decrease Outcome: Progressing   Problem: Elimination: Goal: Will not experience complications related to urinary retention Outcome: Progressing

## 2020-10-01 NOTE — Progress Notes (Signed)
Philip Trevino Progress Note  Subjective:  1.9 L UOP yest , -1.0 L net.   Vitals:   09/30/20 2140 10/01/20 0500 10/01/20 0545 10/01/20 0800  BP: 128/79  (!) 153/81 120/80  Pulse: 69  61   Resp: 16  16   Temp: 98.7 F (37.1 C)  98.2 F (36.8 C)   TempSrc: Oral     SpO2: 97%  100%   Weight:  81.9 kg    Height:        Exam: Gen alert, facial edema improving No jvd or bruits Chest clear bilat to bases RRR no MRG Abd soft ntnd no mass or ascites +bs Ext diffuse bilat LE edema 2+, improving  Neuro is alert, Ox 3 , nf      Home meds:  - norvasc 10/ demadex 100 qd  - asa 81/ protonix 40 qd  - prn's/ vitamins/ supplements    CXR - no active disease   Last UA 01/28/20 - nazy, >300 prot, many bact, 0-5 rbc/ wbc   Last UPC ratio 01/28/20 - 10.63   Na 137  K 4.6  CO2 26  BUN 45  Cr 3.10  Alb < 1.0  Tprot 3.3   WBC 13K Hb 10.7  plt 437    Assessment/ Plan: 1. Renal failure - b/l creat around 0.9- 1.0 from mid 2021. 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   He was lost to f/u and stopped taking his prednisone/ diuretics several mos ago per the pt. Here w/ [place] 3.2. AKI unclear cause, some component of disease progression likely, but also prob some hemodynamic factors given creat improvement w/ diuresis here.  Creat stable at 1.7 today. Added lisinopril 3/09 for proteinuria. With improving renal function, may be able to receive future immunosuppression if he can be compliant w/ meds and office visits. Defer to his renal MD Dr Hollie Salk to decide in the OP setting. For now cont diuresis, hopefully can dc soon.  2. Hyperlipidemia - assoc w/ neph syndrome, statin per primary team 3. Vol overload / edema - in pt w/ nephrotic syndrome, low serum albumin. Will ^ lasix to maximum 160 mg IV qid + zaroxolyn 10 qd.  4. HTN - resume home meds (norvasc) as needed     Rob Norrin Shreffler 10/01/2020, 8:32 AM   Recent Labs  Lab 09/27/20 1330  09/27/20 1330 09/28/20 0327 09/29/20 0259 09/30/20 0241 10/01/20 0334  K 4.6  --  4.7  4.7 4.0 3.5 3.7  BUN 45*  --  48*  45* 44* 37* 34*  CREATININE 3.10*  --  2.53*  2.66* 2.09* 1.73* 1.78*  CALCIUM 7.3*  --  7.2*  7.3* 7.3* 7.4* 7.5*  PHOS  --    < > 5.4* 5.2* 4.7*  --   HGB 10.7*  --  10.5*  --   --   --    < > = values in this interval not displayed.   Inpatient medications: . amLODipine  10 mg Oral Daily  . aspirin  81 mg Oral Daily  . atorvastatin  80 mg Oral Daily  . ezetimibe  10 mg Oral Daily  . heparin  5,000 Units Subcutaneous Q8H  . lisinopril  10 mg Oral Daily  . metolazone  5 mg Oral BID  . pantoprazole  40 mg Oral Daily   . sodium chloride Stopped (09/30/20 1629)  . furosemide 160 mg (10/01/20 0759)   sodium  chloride, acetaminophen **OR** acetaminophen

## 2020-10-02 LAB — BASIC METABOLIC PANEL
Anion gap: 5 (ref 5–15)
BUN: 35 mg/dL — ABNORMAL HIGH (ref 6–20)
CO2: 30 mmol/L (ref 22–32)
Calcium: 7.4 mg/dL — ABNORMAL LOW (ref 8.9–10.3)
Chloride: 103 mmol/L (ref 98–111)
Creatinine, Ser: 1.88 mg/dL — ABNORMAL HIGH (ref 0.61–1.24)
GFR, Estimated: 51 mL/min — ABNORMAL LOW (ref >60)
Glucose, Bld: 93 mg/dL (ref 70–99)
Potassium: 3.8 mmol/L (ref 3.5–5.1)
Sodium: 138 mmol/L (ref 135–145)

## 2020-10-02 NOTE — Plan of Care (Signed)
Plan of care reviewed and discussed with the patient. 

## 2020-10-02 NOTE — Progress Notes (Signed)
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  He has been diuresed with IV Lasix and Zaroxolyn.  Appears to have had successful diuresis with decrease weight 81.9 kg.  His urine output appears to be about 3 L on 10/01/2020  Blood pressure 116/84 pulse 70 temps 98.3 O2 sats 98% room air  Sodium 138 potassium 3.8 chloride 103 CO2 30 BUN 35 creatinine 1.88 glucose 93 calcium 7.4 albumin 1.5 hemoglobin 10.5   Objective:  Vital signs in last 24 hours:  Temp:  [97.9 F (36.6 C)-98.3 F (36.8 C)] 98.3 F (36.8 C) (03/14 0517) Pulse Rate:  [70-77] 70 (03/14 0517) Resp:  [17-18] 18 (03/14 0517) BP: (116-134)/(83-85) 116/84 (03/14 0517) SpO2:  [98 %-100 %] 98 % (03/14 0517)  Weight change:  Filed Weights   09/29/20 0500 09/30/20 0500 10/01/20 0500  Weight: 84.4 kg 82.1 kg 81.9 kg    Intake/Output: I/O last 3 completed shifts: In: 1151.2 [P.O.:759; I.V.:100.2; IV Piggyback:291.9] Out: [id number] [Urine:3575]   Intake/Output this shift:  Total I/O In: -  Out: 150 [Urine:150]  Gen alert, facial edema improving No jvd or bruits Chest clear bilat to bases RRR no MRG Abd soft ntnd no mass or ascites +bs Extdiffuse bilat LEedema2+, improving  Neuro is alert, Ox 3 , nf   Basic Metabolic Panel: Recent Labs  Lab 09/28/20 0327 09/29/20 0259 09/30/20 0241 10/01/20 0334 10/02/20 0257  NA 137  137 138 138 138 138  K 4.7  4.7 4.0 3.5 3.7 3.8  CL 107  107 106 104 103 103  CO2 '25  26 25 27 28 30  '$ GLUCOSE 90  91 92 106* 111* 93  BUN 48*  45* 44* 37* 34* 35*  CREATININE 2.53*  2.66* 2.09* 1.73* 1.78* 1.88*  CALCIUM 7.2*  7.3* 7.3* 7.4* 7.5* 7.4*  PHOS 5.4* 5.2* 4.7*  --   --     Liver Function Tests: Recent Labs  Lab 09/27/20 1330 09/28/20 0327 09/29/20 0259 09/30/20 0241  AST 16  --   --   --   ALT 18  --   --    --   ALKPHOS 54  --   --   --   BILITOT 0.5  --   --   --   PROT 3.3*  --   --   --   ALBUMIN <1.0* 1.5* 1.7* 1.5*   Recent Labs  Lab 09/27/20 1758  LIPASE 51   No results for input(s): AMMONIA in the last 168 hours.  CBC: Recent Labs  Lab 09/27/20 1330 09/28/20 0327  WBC 13.8* 9.7  NEUTROABS 9.4*  --   HGB 10.7* 10.5*  HCT 31.7* 31.0*  MCV 86.8 87.1  PLT 437* 353    Cardiac Enzymes: No results for input(s): CKTOTAL, CKMB, CKMBINDEX, TROPONINI in the last 168 hours.  BNP: Invalid input(s): POCBNP  CBG: No results for input(s): GLUCAP in the last 168 hours.  Microbiology: Results for orders placed or performed during the hospital encounter of 09/27/20  Resp Panel by RT-PCR (Flu A&B, Covid) Nasopharyngeal Swab     Status: None   Collection Time: 09/27/20  1:39 PM   Specimen: Nasopharyngeal Swab; Nasopharyngeal(NP) swabs in vial transport medium  Result Value Ref Range Status   SARS Coronavirus 2 by RT PCR NEGATIVE NEGATIVE Final    Comment: (NOTE) SARS-CoV-2 target nucleic  acids are NOT DETECTED.  The SARS-CoV-2 RNA is generally detectable in upper respiratory specimens during the acute phase of infection. The lowest concentration of SARS-CoV-2 viral copies this assay can detect is 138 copies/mL. A negative result does not preclude SARS-Cov-2 infection and should not be used as the sole basis for treatment or other patient management decisions. A negative result may occur with  improper specimen collection/handling, submission of specimen other than nasopharyngeal swab, presence of viral mutation(s) within the areas targeted by this assay, and inadequate number of viral copies(<138 copies/mL). A negative result must be combined with clinical observations, patient history, and epidemiological information. The expected result is Negative.  Fact Sheet for Patients:  EntrepreneurPulse.com.au  Fact Sheet for Healthcare Providers:   IncredibleEmployment.be  This test is no t yet approved or cleared by the [place] FDA and  has been authorized for detection and/or diagnosis of SARS-CoV-2 by FDA under an Emergency Use Authorization (EUA). This EUA will remain  in effect (meaning this test can be used) for the duration of the COVID-19 declaration under Section 564(b)(1) of the Act, 21 U.S.C.section 360bbb-3(b)(1), unless the authorization is terminated  or revoked sooner.       Influenza A by PCR NEGATIVE NEGATIVE Final   Influenza B by PCR NEGATIVE NEGATIVE Final    Comment: (NOTE) The Xpert Xpress SARS-CoV-2/FLU/RSV plus assay is intended as an aid in the diagnosis of influenza from Nasopharyngeal swab specimens and should not be used as a sole basis for treatment. Nasal washings and aspirates are unacceptable for Xpert Xpress SARS-CoV-2/FLU/RSV testing.  Fact Sheet for Patients: EntrepreneurPulse.com.au  Fact Sheet for Healthcare Providers: IncredibleEmployment.be  This test is not yet approved or cleared by the [place] FDA and has been authorized for detection and/or diagnosis of SARS-CoV-2 by FDA under an Emergency Use Authorization (EUA). This EUA will remain in effect (meaning this test can be used) for the duration of the COVID-19 declaration under Section 564(b)(1) of the Act, 21 U.S.C. section 360bbb-3(b)(1), unless the authorization is terminated or revoked.  Performed at [facility], [place] [address], [place], [place] [address]     Coagulation Studies: No results for input(s): LABPROT, INR in the last 72 hours.  Urinalysis: No results for input(s): COLORURINE, LABSPEC, PHURINE, GLUCOSEU, HGBUR, BILIRUBINUR, KETONESUR, PROTEINUR, UROBILINOGEN, NITRITE, LEUKOCYTESUR in the last 72 hours.  Invalid input(s): APPERANCEUR    Imaging: No results found.   Medications:   . sodium chloride Stopped (10/01/20  1704)  . furosemide 160 mg (10/02/20 0824)   . amLODipine  10 mg Oral Daily  . aspirin  81 mg Oral Daily  . atorvastatin  80 mg Oral Daily  . ezetimibe  10 mg Oral Daily  . heparin  5,000 Units Subcutaneous Q8H  . hydrocortisone cream   Topical BID  . lisinopril  10 mg Oral Daily  . metolazone  5 mg Oral BID  . pantoprazole  40 mg Oral Daily   sodium chloride, acetaminophen **OR** acetaminophen  Assessment/ Plan:  1. Renal failure - b/l creat around 0.9- 1.0 from mid 2021. 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   He was lost to f/u and stopped taking his prednisone/ diuretics several mos ago per the pt. Here w/ [place] 3.2. AKI unclear cause, some component of disease progression likely, but also prob some hemodynamic factors given creat improvement w/ diuresis here.  Creat stable at 1.7 today. Added lisinopril 3/09  for proteinuria. With improving renal function, may be able to receive future immunosuppression if he can be compliant w/ meds and office visits. Defer to his renal MD Dr Hollie Salk to decide in the OP setting. For now cont diuresis, hopefully can dc soon.  2. Hyperlipidemia - assoc w/ neph syndrome, statin per primary team 3. Vol overload / edema - in pt w/ nephrotic syndrome, low serum albumin. Will ^ lasix to maximum 160 mg IV qid + zaroxolyn 10 qd.  4. HTN - resume home meds (norvasc) as needed      LOS: [place] '@TODAY''@8'$ :55 AM

## 2020-10-02 NOTE — Progress Notes (Signed)
PROGRESS NOTE    Philip Trevino  [id number] DOB: 04-28-1996 DOA: 09/27/2020 PCP: Clent Demark, PA-C     Brief Narrative:  Philip Trevino is a 25 year old male with past medical history of nephrotic syndrome (minimal-change disease and/or FSGS), medication noncompliance who presented to the ED for evaluation of leg and facial swelling which has worsened over the past week.  Has had bilateral lower extremity swelling for several months which has been intermittent however for the past month the swelling has been more persistent in the last week has involved his face. Has not been taking his medications for the past 3 months due to financial reasons.  Does not have issues with eating or breathing but states that he feels like he is "lightly being choked." He was started on IV diuretic and IV albumin, nephrology managing.   New events last 24 hours / Subjective: No new complaints this morning. His UOP recorded 2.1 L last 24 hours and Cr remains stable. He is wondering when he will be discharged home. States his swelling continues to improve.   Assessment & Plan:   Principal Problem:   AKI (acute kidney injury) ([place]) Active Problems:   Anasarca   Nephrotic syndrome   Benign essential HTN   AKI in setting of nephrotic syndrome, FSGS and/or minimal-change disease -Baseline creatinine around 0.9-1.0 -Appreciate nephrology -Continue IV Lasix, Zaroxolyn -Creatinine improved since admission   Anasarca secondary to nephrotic syndrome -Continue IV Lasix, Zaroxolyn -Daily weight and strict I's and O's, fluid restriction   Hyperlipidemia -Total cholesterol 494, LDL 415, triglycerides 268 -Started high intensity statin therapy, Lipitor 80 mg as well as Zetia.  Recommend repeat lipid profile in 6 weeks as an outpatient.  Could consider Repatha as an outpatient.  Hypertension -Continue Norvasc, Lasix, lisinopril, Zaroxolyn   DVT prophylaxis:  heparin injection 5,000 Units Start:  09/27/20 2200  Code Status: Full code Family Communication: No family at bedside Disposition Plan:  Status is: Inpatient  Remains inpatient appropriate because:IV treatments appropriate due to intensity of illness or inability to take PO   Dispo: The patient is from: Home              Anticipated d/c is to: Home              Patient currently is not medically stable to d/c.  Remains on IV Lasix   Difficult to place patient No      Consultants:   Nephrology  Procedures:   None  Antimicrobials:  Anti-infectives (From admission, onward)   None       Objective: Vitals:   10/01/20 0800 10/01/20 1255 10/01/20 2040 10/02/20 0517  BP: 120/80 124/83 134/85 116/84  Pulse:  77 77 70  Resp:  '17 17 18  '$ Temp:  97.9 F (36.6 C) 98.3 F (36.8 C) 98.3 F (36.8 C)  TempSrc:  Oral  Oral  SpO2:  100% 100% 98%  Weight:      Height:        Intake/Output Summary (Last 24 hours) at 10/02/2020 0908 Last data filed at 10/02/2020 0824 Gross per 24 hour  Intake 652.15 ml  Output 2125 ml  Net -1472.85 ml   Filed Weights   09/29/20 0500 09/30/20 0500 10/01/20 0500  Weight: 84.4 kg 82.1 kg 81.9 kg    Examination: General exam: Appears calm and comfortable, +facial edema   Respiratory system: Clear to auscultation. Respiratory effort normal. Cardiovascular system: S1 & S2 heard, RRR. +pedal edema. Gastrointestinal system: Abdomen is nondistended,  soft and nontender. Normal bowel sounds heard. Central nervous system: Alert and oriented. Non focal exam. Speech clear  Extremities: Symmetric in appearance bilaterally  Skin: No rashes, lesions or ulcers on exposed skin  Psychiatry: Judgement and insight appear stable. Mood & affect appropriate.     Data Reviewed: I have personally reviewed following labs and imaging studies  CBC: Recent Labs  Lab 09/27/20 1330 09/28/20 0327  WBC 13.8* 9.7  NEUTROABS 9.4*  --   HGB 10.7* 10.5*  HCT 31.7* 31.0*  MCV 86.8 87.1  PLT 437*  0000000   Basic Metabolic Panel: Recent Labs  Lab 09/28/20 0327 09/29/20 0259 09/30/20 0241 10/01/20 0334 10/02/20 0257  NA 137   137 138 138 138 138  K 4.7   4.7 4.0 3.5 3.7 3.8  CL 107   107 106 104 103 103  CO2 '25   26 25 27 28 30  '$ GLUCOSE 90   91 92 106* 111* 93  BUN 48*   45* 44* 37* 34* 35*  CREATININE 2.53*   2.66* 2.09* 1.73* 1.78* 1.88*  CALCIUM 7.2*   7.3* 7.3* 7.4* 7.5* 7.4*  PHOS 5.4* 5.2* 4.7*  --   --    GFR: Estimated Creatinine Clearance: 61.1 mL/min (A) (by C-G formula based on SCr of 1.88 mg/dL (H)). Liver Function Tests: Recent Labs  Lab 09/27/20 1330 09/28/20 0327 09/29/20 0259 09/30/20 0241  AST 16  --   --   --   ALT 18  --   --   --   ALKPHOS 54  --   --   --   BILITOT 0.5  --   --   --   PROT 3.3*  --   --   --   ALBUMIN <1.0* 1.5* 1.7* 1.5*   Recent Labs  Lab 09/27/20 1758  LIPASE 51   No results for input(s): AMMONIA in the last 168 hours. Coagulation Profile: No results for input(s): INR, PROTIME in the last 168 hours. Cardiac Enzymes: No results for input(s): CKTOTAL, CKMB, CKMBINDEX, TROPONINI in the last 168 hours. BNP (last 3 results) No results for input(s): PROBNP in the last 8760 hours. HbA1C: No results for input(s): HGBA1C in the last 72 hours. CBG: No results for input(s): GLUCAP in the last 168 hours. Lipid Profile: No results for input(s): CHOL, HDL, LDLCALC, TRIG, CHOLHDL, LDLDIRECT in the last 72 hours. Thyroid Function Tests: No results for input(s): TSH, T4TOTAL, FREET4, T3FREE, THYROIDAB in the last 72 hours. Anemia Panel: No results for input(s): VITAMINB12, FOLATE, FERRITIN, TIBC, IRON, RETICCTPCT in the last 72 hours. Sepsis Labs: No results for input(s): PROCALCITON, LATICACIDVEN in the last 168 hours.  Recent Results (from the past 240 hour(s))  Resp Panel by RT-PCR (Flu A&B, Covid) Nasopharyngeal Swab     Status: None   Collection Time: 09/27/20  1:39 PM   Specimen: Nasopharyngeal Swab; Nasopharyngeal(NP)  swabs in vial transport medium  Result Value Ref Range Status   SARS Coronavirus 2 by RT PCR NEGATIVE NEGATIVE Final    Comment: (NOTE) SARS-CoV-2 target nucleic acids are NOT DETECTED.  The SARS-CoV-2 RNA is generally detectable in upper respiratory specimens during the acute phase of infection. The lowest concentration of SARS-CoV-2 viral copies this assay can detect is 138 copies/mL. A negative result does not preclude SARS-Cov-2 infection and should not be used as the sole basis for treatment or other patient management decisions. A negative result may occur with  improper specimen collection/handling, submission of specimen other  than nasopharyngeal swab, presence of viral mutation(s) within the areas targeted by this assay, and inadequate number of viral copies(<138 copies/mL). A negative result must be combined with clinical observations, patient history, and epidemiological information. The expected result is Negative.  Fact Sheet for Patients:  EntrepreneurPulse.com.au  Fact Sheet for Healthcare Providers:  IncredibleEmployment.be  This test is no t yet approved or cleared by the [place] FDA and  has been authorized for detection and/or diagnosis of SARS-CoV-2 by FDA under an Emergency Use Authorization (EUA). This EUA will remain  in effect (meaning this test can be used) for the duration of the COVID-19 declaration under Section 564(b)(1) of the Act, 21 U.S.C.section 360bbb-3(b)(1), unless the authorization is terminated  or revoked sooner.       Influenza A by PCR NEGATIVE NEGATIVE Final   Influenza B by PCR NEGATIVE NEGATIVE Final    Comment: (NOTE) The Xpert Xpress SARS-CoV-2/FLU/RSV plus assay is intended as an aid in the diagnosis of influenza from Nasopharyngeal swab specimens and should not be used as a sole basis for treatment. Nasal washings and aspirates are unacceptable for Xpert Xpress  SARS-CoV-2/FLU/RSV testing.  Fact Sheet for Patients: EntrepreneurPulse.com.au  Fact Sheet for Healthcare Providers: IncredibleEmployment.be  This test is not yet approved or cleared by the [place] FDA and has been authorized for detection and/or diagnosis of SARS-CoV-2 by FDA under an Emergency Use Authorization (EUA). This EUA will remain in effect (meaning this test can be used) for the duration of the COVID-19 declaration under Section 564(b)(1) of the Act, 21 U.S.C. section 360bbb-3(b)(1), unless the authorization is terminated or revoked.  Performed at [facility], [place] [address], [place], [place] [address]       Radiology Studies: No results found.    Scheduled Meds:  amLODipine  10 mg Oral Daily   aspirin  81 mg Oral Daily   atorvastatin  80 mg Oral Daily   ezetimibe  10 mg Oral Daily   heparin  5,000 Units Subcutaneous Q8H   hydrocortisone cream   Topical BID   lisinopril  10 mg Oral Daily   metolazone  5 mg Oral BID   pantoprazole  40 mg Oral Daily   Continuous Infusions:  sodium chloride Stopped (10/01/20 1704)   furosemide 160 mg (10/02/20 0824)     LOS: 5 days      Time spent: 20 minutes   Dessa Phi, DO [facility] 10/02/2020, 9:08 AM   Available via Epic secure chat 7am-7pm After these hours, please refer to coverage provider listed on [email or web address]

## 2020-10-03 ENCOUNTER — Other Ambulatory Visit: Payer: Self-pay | Admitting: Internal Medicine

## 2020-10-03 LAB — BASIC METABOLIC PANEL
Anion gap: 9 (ref 5–15)
BUN: 42 mg/dL — ABNORMAL HIGH (ref 6–20)
CO2: 27 mmol/L (ref 22–32)
Calcium: 7.4 mg/dL — ABNORMAL LOW (ref 8.9–10.3)
Chloride: 101 mmol/L (ref 98–111)
Creatinine, Ser: 1.99 mg/dL — ABNORMAL HIGH (ref 0.61–1.24)
GFR, Estimated: 47 mL/min — ABNORMAL LOW (ref >60)
Glucose, Bld: 88 mg/dL (ref 70–99)
Potassium: 3.6 mmol/L (ref 3.5–5.1)
Sodium: 137 mmol/L (ref 135–145)

## 2020-10-03 MED ORDER — ASPIRIN 81 MG PO CHEW: 81.0000 mg | CHEWABLE_TABLET | Freq: Every day | ORAL | 2 refills | Status: DC

## 2020-10-03 MED ORDER — FUROSEMIDE 40 MG PO TABS: 80.0000 mg | ORAL_TABLET | Freq: Two times a day (BID) | ORAL | Status: DC

## 2020-10-03 MED ORDER — EZETIMIBE 10 MG PO TABS: 10.0000 mg | ORAL_TABLET | Freq: Every day | ORAL | 2 refills | Status: DC

## 2020-10-03 MED ORDER — FUROSEMIDE 80 MG PO TABS: 80.0000 mg | ORAL_TABLET | Freq: Two times a day (BID) | ORAL | 2 refills | Status: DC

## 2020-10-03 MED ORDER — LISINOPRIL 10 MG PO TABS: 10.0000 mg | ORAL_TABLET | Freq: Every day | ORAL | 2 refills | Status: DC

## 2020-10-03 MED ORDER — ATORVASTATIN CALCIUM 80 MG PO TABS: 80.0000 mg | ORAL_TABLET | Freq: Every day | ORAL | 2 refills | Status: AC

## 2020-10-03 NOTE — Progress Notes (Signed)
Patient discharged to home. All belongings, instructions w/ patient. Verbalized understanding of instructions.

## 2020-10-03 NOTE — Discharge Instructions (Signed)
Nephrotic Syndrome  Nephrotic syndrome is a condition that results from damage to the glomeruli in the kidneys. Glomeruli are filters that remove toxins and waste products from the bloodstream. When they are damaged, glomeruli may also remove needed products, such as proteins. In nephrotic syndrome, the body loses too much of the proteins and other needed products. If you have nephrotic syndrome, you may have:  Proteinuria, which is high levels of protein in your urine.  High blood pressure.  Hypoalbuminemia, which is a low level of the protein albumin in your blood.  High cholesterol.  High blood levels of fatty substances known as triglycerides.  Edema, or swelling, of your face, abdomen, arms, and legs. Nephrotic syndrome may increase your risk of further kidney damage and of health problems such as blood clots and infections. What are the causes? This condition may be caused by other kidney diseases that damage the glomeruli. These may include:  Minimal change disease.  Focal segmental glomerulosclerosis.  Membranous nephropathy.  Glomerulonephritis. In some cases, the cause may not be known. What increases the risk? The following factors may make you more likely to develop this condition:  Having certain medical conditions. These include: ? Diabetes. ? A disease, such as lupus, that causes your body's defense system (immune system) to attack the body itself. ? Amyloidosis. ? Some types of cancer, including a cancer of plasma cells (multiple myeloma). ? An infection, such as hepatitis C.  Taking certain medicines, such as ibuprofen and other NSAIDs, and some anti-cancer drugs. What are the signs or symptoms? Symptoms of this condition include:  Edema.  Foamy urine.  Unexplained weight gain.  Decreased urine production. This may develop slowly and may not be obvious until you have shortness of breath, weakness, or tiredness.  Loss of appetite. In some cases,  there are no symptoms. How is this diagnosed? This condition may be diagnosed with two urine tests:  A dipstick urine test.  A 24-hour urine test where you collect all of the urine that you make over a 24-hour period. If your test results show that you have large amounts of certain proteins in your urine, more tests may be needed to confirm the diagnosis and find the cause. These tests may include:  Blood tests.  More urine testing.  Getting a sample of your kidney tissue to look at under a microscope(kidney biopsy). How is this treated? This condition may be treated with medicines to control symptoms or to prevent other health problems that may make your condition worse. These medicines may:  Decrease inflammation in your kidneys.  Lower blood pressure.  Lower cholesterol.  Help control edema. Further treatment for this condition will depend on the cause. Your health care provider will talk about treatment options with you. Follow these instructions at home: Medicines  Take over-the-counter and prescription medicines only as told by your health care provider. Many medicines can make this condition worse. Doses may need to be adjusted.  Do not take ibuprofen or other NSAIDs.  Do not take any new over-the-counter medicines or nutritional supplements unless approved by your health care provider. General instructions  Follow instructions from your health care provider about eating or drinking restrictions. This may include changes to help manage swelling or high blood pressure, such as limiting your intake of salt or fluids.  Wear compression stockings as told by your health care provider. These stockings help to prevent blood clots and reduce swelling in your legs.  Keep all follow-up visits as told   by your health care provider. This is important. Contact a health care provider if:  Your symptoms do not go away as expected or you develop new symptoms.  You continue to gain  weight.  You have increased swelling, pain, or redness of your feet, ankles, or legs. Get help right away if:  You stop making urine.  You have prolonged bleeding.  You have shortness of breath or difficulty breathing.  You develop chest pain or tightness.  You have a severe headache.  You have severe weakness. These symptoms may represent a serious problem that is an emergency. Do not wait to see if the symptoms will go away. Get medical help right away. Call your local emergency services (911 in the [place] Do not drive yourself to the hospital. Summary  Nephrotic syndrome may be caused by kidney diseases that damage the filters in the kidneys (glomeruli). In some cases, the cause may not be known.  This condition may be treated with medicines to control symptoms or to prevent other health problems that may make your condition worse.  Take over-the-counter and prescription medicines only as told by your health care provider. Do not take ibuprofen or other NSAIDs, any new over-the-counter medicines, or any nutritional supplements unless approved. This information is not intended to replace advice given to you by your health care provider. Make sure you discuss any questions you have with your health care provider. Document Revised: 05/27/2019 Document Reviewed: 05/27/2019 Elsevier Patient Education  2021 [employer].

## 2020-10-03 NOTE — Progress Notes (Signed)
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  He has been diuresed with IV Lasix and Zaroxolyn.  Appears to have had successful diuresis with decrease weight 81.9 kg.  His urine output appears to be about 1.8 L 10/02/2020  Blood pressure 115/71 pulse 85 temperature 98 O2 sats 90% room air  Sodium 137 potassium 3.6 chloride 101 CO2 27 BUN 42 creatinine one-point glucose 88 calcium 7.4 albumin 1.5 cholesterol 494 hemoglobin 10.7  Meds   atorvastatin 80 mg daily Zetia 10 mg daily 660 mg every 6 hours metolazone 5 mg twice daily lisinopril 10 mg daily aspirin 81 mg daily Protonix 40 mg daily amlodipine 10  Objective:  Vital signs in last 24 hours:  Temp:  [98 F (36.7 C)-98.9 F (37.2 C)] 98 F (36.7 C) (03/15 0533) Pulse Rate:  [68-89] 68 (03/15 0533) Resp:  [16-17] 16 (03/15 0533) BP: (115-135)/(71-84) 115/71 (03/15 0533) SpO2:  [99 %-100 %] 100 % (03/15 0533)  Weight change:  Filed Weights   09/29/20 0500 09/30/20 0500 10/01/20 0500  Weight: 84.4 kg 82.1 kg 81.9 kg    Intake/Output: I/O last 3 completed shifts: In: 1357.6 [P.O.:600; I.V.:359.6; IV Piggyback:398] Out: 2825 [Urine:2825]   Intake/Output this shift:  No intake/output data recorded.  Gen alert, facial edema improving No jvd or bruits Chest clear bilat to bases RRR no MRG Abd soft ntnd no mass or ascites +bs Ext mild lower extremity edema Neuro is alert, Ox 3 , nf   Basic Metabolic Panel: Recent Labs  Lab 09/28/20 0327 09/29/20 0259 09/30/20 0241 10/01/20 0334 10/02/20 0257 10/03/20 0253  NA 137   137 138 138 138 138 137  K 4.7   4.7 4.0 3.5 3.7 3.8 3.6  CL 107   107 106 104 103 103 101  CO2 '25   26 25 27 28 30 27  '$ GLUCOSE 90   91 92 106* 111* 93 88  BUN 48*   45* 44* 37* 34* 35* 42*  CREATININE 2.53*   2.66* 2.09* 1.73* 1.78* 1.88* 1.99*   CALCIUM 7.2*   7.3* 7.3* 7.4* 7.5* 7.4* 7.4*  PHOS 5.4* 5.2* 4.7*  --   --   --     Liver Function Tests: Recent Labs  Lab 09/27/20 1330 09/28/20 0327 09/29/20 0259 09/30/20 0241  AST 16  --   --   --   ALT 18  --   --   --   ALKPHOS 54  --   --   --   BILITOT 0.5  --   --   --   PROT 3.3*  --   --   --   ALBUMIN <1.0* 1.5* 1.7* 1.5*   Recent Labs  Lab 09/27/20 1758  LIPASE 51   No results for input(s): AMMONIA in the last 168 hours.  CBC: Recent Labs  Lab 09/27/20 1330 09/28/20 0327  WBC 13.8* 9.7  NEUTROABS 9.4*  --   HGB 10.7* 10.5*  HCT 31.7* 31.0*  MCV 86.8 87.1  PLT 437* 353    Cardiac Enzymes: No results for input(s): CKTOTAL, CKMB, CKMBINDEX, TROPONINI in the last 168 hours.  BNP: Invalid input(s): POCBNP  CBG: No results for input(s): GLUCAP in the last 168 hours.  Microbiology: Results for orders placed or performed during the hospital encounter of 09/27/20  Resp Panel by RT-PCR (Flu A&B,  Covid) Nasopharyngeal Swab     Status: None   Collection Time: 09/27/20  1:39 PM   Specimen: Nasopharyngeal Swab; Nasopharyngeal(NP) swabs in vial transport medium  Result Value Ref Range Status   SARS Coronavirus 2 by RT PCR NEGATIVE NEGATIVE Final    Comment: (NOTE) SARS-CoV-2 target nucleic acids are NOT DETECTED.  The SARS-CoV-2 RNA is generally detectable in upper respiratory specimens during the acute phase of infection. The lowest concentration of SARS-CoV-2 viral copies this assay can detect is 138 copies/mL. A negative result does not preclude SARS-Cov-2 infection and should not be used as the sole basis for treatment or other patient management decisions. A negative result may occur with  improper specimen collection/handling, submission of specimen other than nasopharyngeal swab, presence of viral mutation(s) within the areas targeted by this assay, and inadequate number of viral copies(<138 copies/mL). A negative result must be combined  with clinical observations, patient history, and epidemiological information. The expected result is Negative.  Fact Sheet for Patients:  EntrepreneurPulse.com.au  Fact Sheet for Healthcare Providers:  IncredibleEmployment.be  This test is no t yet approved or cleared by the [place] FDA and  has been authorized for detection and/or diagnosis of SARS-CoV-2 by FDA under an Emergency Use Authorization (EUA). This EUA will remain  in effect (meaning this test can be used) for the duration of the COVID-19 declaration under Section 564(b)(1) of the Act, 21 U.S.C.section 360bbb-3(b)(1), unless the authorization is terminated  or revoked sooner.       Influenza A by PCR NEGATIVE NEGATIVE Final   Influenza B by PCR NEGATIVE NEGATIVE Final    Comment: (NOTE) The Xpert Xpress SARS-CoV-2/FLU/RSV plus assay is intended as an aid in the diagnosis of influenza from Nasopharyngeal swab specimens and should not be used as a sole basis for treatment. Nasal washings and aspirates are unacceptable for Xpert Xpress SARS-CoV-2/FLU/RSV testing.  Fact Sheet for Patients: EntrepreneurPulse.com.au  Fact Sheet for Healthcare Providers: IncredibleEmployment.be  This test is not yet approved or cleared by the [place] FDA and has been authorized for detection and/or diagnosis of SARS-CoV-2 by FDA under an Emergency Use Authorization (EUA). This EUA will remain in effect (meaning this test can be used) for the duration of the COVID-19 declaration under Section 564(b)(1) of the Act, 21 U.S.C. section 360bbb-3(b)(1), unless the authorization is terminated or revoked.  Performed at [facility], [place] [address], [place], [place] [address]     Coagulation Studies: No results for input(s): LABPROT, INR in the last 72 hours.  Urinalysis: No results for input(s): COLORURINE, LABSPEC, PHURINE, GLUCOSEU,  HGBUR, BILIRUBINUR, KETONESUR, PROTEINUR, UROBILINOGEN, NITRITE, LEUKOCYTESUR in the last 72 hours.  Invalid input(s): APPERANCEUR    Imaging: No results found.   Medications:    sodium chloride Stopped (10/01/20 1704)   furosemide 160 mg (10/03/20 0821)    amLODipine  10 mg Oral Daily   aspirin  81 mg Oral Daily   atorvastatin  80 mg Oral Daily   ezetimibe  10 mg Oral Daily   heparin  5,000 Units Subcutaneous Q8H   hydrocortisone cream   Topical BID   lisinopril  10 mg Oral Daily   metolazone  5 mg Oral BID   pantoprazole  40 mg Oral Daily   sodium chloride, acetaminophen **OR** acetaminophen  Assessment/ Plan:  1. Renal failure - b/l creat around 0.9- 1.0 from mid 2021. 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  He was lost to f/u and stopped taking his prednisone/ diuretics several mos ago per the pt. Here w/ [place] 3.2. AKI unclear cause, some component of disease progression likely, but also prob some hemodynamic factors given creat improvement w/ diuresis here.    Creatinine appears to be stable. Added lisinopril 3/09 for proteinuria. With improving renal function, may be able to receive future immunosuppression if he can be compliant w/ meds and office visits. Defer to his renal MD Dr Hollie Salk to decide in the OP setting. For now cont diuresis, hopefully can dc soon.  2. Hyperlipidemia -Lipitor and Zetia 3. Vol overload / edema - in pt w/ nephrotic syndrome, low serum albumin.   This seems somewhat improved over change patient to Lasix 80 mg twice daily.  Daily weights.  Additional Lasix if weight is greater than 3 pounds in 24 hours or 5 pounds a week.  4. HTN -I would discontinue his Norvasc  Patient is appropriate for discharge on oral diuretics.  He will need follow-up at [facility] in 2 weeks.     LOS: [place] '@TODAY''@9'$ :26 AM

## 2020-10-03 NOTE — Discharge Summary (Signed)
Physician Discharge Summary  Philip Trevino [id number] DOB: [birth year] DOA: 09/27/2020  PCP: Philip Demark, PA-C  Admit date: 09/27/2020 Discharge date: 10/03/2020  Admitted From: Home Disposition:  Home  Recommendations for Outpatient Follow-up:  1. Follow up with PCP in 1 week 2. Follow up with [place] in 2 weeks  3. Repeat fasting lipid panel in 6 weeks  Discharge Condition: Stable CODE STATUS: Full  Diet recommendation: Renal diet   Brief/Interim Summary: Philip Trevino is a 25 year old male with past medical history of nephrotic syndrome(minimal-change disease and/or FSGS), medication noncompliance who presented to the ED for evaluation of leg and facial swellingwhich has worsened over the past week. Has had bilateral lower extremity swelling for several months which has been intermittent however for the past month the swelling has been more persistent in the last week has involved his face. Has not been taking his medications for the past 3 months due to financial reasons. Does not have issues with eating or breathing but states that he feels like he is "lightly being choked." He was started on IV diuretic and IV albumin.  His swelling continued to improve, had good urine output on IV diuretic.  Renal function improved and stabilized.  He was deemed stable for discharge home on oral diuretic therapy and to follow-up closely with his nephrologist as an outpatient.  Discharge Diagnoses:  Principal Problem:   AKI (acute kidney injury) ([place]) Active Problems:   Anasarca   Nephrotic syndrome   Benign essential HTN   AKI in setting of nephrotic syndrome, FSGS and/or minimal-change disease -Baseline creatinine around 0.9-1.0 -Appreciate nephrology -Creatinine improved since admission  -Diuresed well on IV Lasix and Zaroxolyn, transition to oral Lasix for discharge  Anasarca secondary to nephrotic syndrome -Daily weight and strict I's and O's, fluid  restriction  -Lasix on discharge  Hyperlipidemia -Total cholesterol 494, LDL 415, triglycerides 268 -Started high intensity statin therapy, Lipitor 80 mg as well as Zetia.  Recommend repeat lipid profile in 6 weeks as an outpatient.  Could consider Repatha as an outpatient.  Hypertension -Continue Lasix, lisinopril   Discharge Instructions  Discharge Instructions    Call MD for:   Complete by: As directed    Worsening swelling, weight gain 3lb in 24 hours or 5lb in 1 week   Call MD for:  difficulty breathing, headache or visual disturbances   Complete by: As directed    Call MD for:  extreme fatigue   Complete by: As directed    Call MD for:  persistant dizziness or light-headedness   Complete by: As directed    Call MD for:  persistant nausea and vomiting   Complete by: As directed    Call MD for:  severe uncontrolled pain   Complete by: As directed    Call MD for:  temperature >100.4   Complete by: As directed    Discharge instructions   Complete by: As directed    You were cared for by a hospitalist during your hospital stay. If you have any questions about your discharge medications or the care you received while you were in the hospital after you are discharged, you can call the unit and ask to speak with the hospitalist on call if the hospitalist that took care of you is not available. Once you are discharged, your primary care physician will handle any further medical issues. Please note that NO REFILLS for any discharge medications will be authorized once you are discharged, as it is imperative  that you return to your primary care physician (or establish a relationship with a primary care physician if you do not have one) for your aftercare needs so that they can reassess your need for medications and monitor your lab values.   Increase activity slowly   Complete by: As directed      Allergies as of 10/03/2020      Reactions   Broccoli [brassica Oleracea] Hives    CAULIFLOWER (allergic, also)      Medication List    STOP taking these medications   acetaminophen 500 MG tablet Commonly known as: TYLENOL   amLODipine 10 MG tablet Commonly known as: NORVASC   pantoprazole 40 MG tablet Commonly known as: PROTONIX   sulfamethoxazole-trimethoprim 400-80 MG tablet Commonly known as: BACTRIM   torsemide 100 MG tablet Commonly known as: DEMADEX     TAKE these medications   aspirin 81 MG chewable tablet Chew 1 tablet (81 mg total) by mouth daily.   atorvastatin 80 MG tablet Commonly known as: LIPITOR Take 1 tablet (80 mg total) by mouth daily. Start taking on: October 04, 2020   ezetimibe 10 MG tablet Commonly known as: ZETIA Take 1 tablet (10 mg total) by mouth daily. Start taking on: October 04, 2020   furosemide 80 MG tablet Commonly known as: LASIX Take 1 tablet (80 mg total) by mouth 2 (two) times daily.   lisinopril 10 MG tablet Commonly known as: ZESTRIL Take 1 tablet (10 mg total) by mouth daily. Start taking on: October 04, 2020       Follow-up Information    Philip Demark, PA-C. Schedule an appointment as soon as possible for a visit in 1 week(s).   Specialty: Physician Assistant Contact information: [place] [address] [phone]        Philip Lips, MD. Schedule an appointment as soon as possible for a visit in 2 week(s).   Specialty: Nephrology Contact information: [address] [place] [address] [phone]              Allergies  Allergen Reactions  . Broccoli [Brassica Oleracea] Hives    CAULIFLOWER (allergic, also)    Consultations:  Nephrology    Procedures/Studies: US Renal  Result Date: 09/27/2020 CLINICAL DATA:  Renal failure. EXAM: RENAL / URINARY TRACT ULTRASOUND COMPLETE COMPARISON:  CT abdomen pelvis August 27, 2017 FINDINGS: Right Kidney: Renal measurements: 11.5 x 6.5 x 5.3 cm = volume: 205 mL. Echogenicity within normal limits. No mass or  hydronephrosis visualized. Left Kidney: Renal measurements: 13.5 x 5.8 x 5.8 cm = volume: 240 mL. Echogenicity within normal limits. No mass or hydronephrosis visualized. Bladder: There is bladder wall thickening measuring up to 4 mm with layering debris in the bladder. Other: Trace abdominopelvic ascites. IMPRESSION: 1. No hydronephrosis 2. Bladder wall thickening with layering debris in the bladder. Correlate with urinalysis. 3. Trace abdominopelvic ascites. Electronically Signed   By: Dahlia Bailiff MD   On: 09/27/2020 15:52   DG Chest Portable 1 View  Result Date: 09/27/2020 CLINICAL DATA:  Swelling of the face and neck. Question fluid overload. EXAM: PORTABLE CHEST 1 VIEW COMPARISON:  01/27/2020 FINDINGS: Heart size upper limits of normal. Mediastinal shadows are normal. Mild blunting of the lateral inferior costophrenic angle on the left could be a small amount of pleural fluid or pleural scarring from previous abnormality in 2021. Consider two-view chest radiography when able. No pulmonary edema. IMPRESSION: Mild blunting of the lateral inferior costophrenic angle on the  left could be a small amount of pleural fluid or pleural scarring from previous abnormality in 2021. Consider two-view chest radiography when able. Electronically Signed   By: Nelson Chimes M.D.   On: 09/27/2020 14:46       Discharge Exam: Vitals:   10/02/20 2154 10/03/20 0533  BP: 121/81 115/71  Pulse: 76 68  Resp: 16 16  Temp: 98.9 F (37.2 C) 98 F (36.7 C)  SpO2: 99% 100%    General: Pt is alert, awake, not in acute distress, +facial edema with improvement  Cardiovascular: RRR, S1/S2 +, trace edema Respiratory: CTA bilaterally, no wheezing, no rhonchi, no respiratory distress, no conversational dyspnea  Abdominal: Soft, NT, ND, bowel sounds + Extremities: trace edema, no cyanosis Psych: Normal mood and affect, stable judgement and insight     The results of significant diagnostics from this hospitalization  (including imaging, microbiology, ancillary and laboratory) are listed below for reference.     Microbiology: Recent Results (from the past 240 hour(s))  Resp Panel by RT-PCR (Flu A&B, Covid) Nasopharyngeal Swab     Status: None   Collection Time: 09/27/20  1:39 PM   Specimen: Nasopharyngeal Swab; Nasopharyngeal(NP) swabs in vial transport medium  Result Value Ref Range Status   SARS Coronavirus 2 by RT PCR NEGATIVE NEGATIVE Final    Comment: (NOTE) SARS-CoV-2 target nucleic acids are NOT DETECTED.  The SARS-CoV-2 RNA is generally detectable in upper respiratory specimens during the acute phase of infection. The lowest concentration of SARS-CoV-2 viral copies this assay can detect is 138 copies/mL. A negative result does not preclude SARS-Cov-2 infection and should not be used as the sole basis for treatment or other patient management decisions. A negative result may occur with  improper specimen collection/handling, submission of specimen other than nasopharyngeal swab, presence of viral mutation(s) within the areas targeted by this assay, and inadequate number of viral copies(<138 copies/mL). A negative result must be combined with clinical observations, patient history, and epidemiological information. The expected result is Negative.  Fact Sheet for Patients:  EntrepreneurPulse.com.au  Fact Sheet for Healthcare Providers:  IncredibleEmployment.be  This test is no t yet approved or cleared by the [place] FDA and  has been authorized for detection and/or diagnosis of SARS-CoV-2 by FDA under an Emergency Use Authorization (EUA). This EUA will remain  in effect (meaning this test can be used) for the duration of the COVID-19 declaration under Section 564(b)(1) of the Act, 21 U.S.C.section 360bbb-3(b)(1), unless the authorization is terminated  or revoked sooner.       Influenza A by PCR NEGATIVE NEGATIVE Final   Influenza B by PCR  NEGATIVE NEGATIVE Final    Comment: (NOTE) The Xpert Xpress SARS-CoV-2/FLU/RSV plus assay is intended as an aid in the diagnosis of influenza from Nasopharyngeal swab specimens and should not be used as a sole basis for treatment. Nasal washings and aspirates are unacceptable for Xpert Xpress SARS-CoV-2/FLU/RSV testing.  Fact Sheet for Patients: EntrepreneurPulse.com.au  Fact Sheet for Healthcare Providers: IncredibleEmployment.be  This test is not yet approved or cleared by the [place] FDA and has been authorized for detection and/or diagnosis of SARS-CoV-2 by FDA under an Emergency Use Authorization (EUA). This EUA will remain in effect (meaning this test can be used) for the duration of the COVID-19 declaration under Section 564(b)(1) of the Act, 21 U.S.C. section 360bbb-3(b)(1), unless the authorization is terminated or revoked.  Performed at [facility], [place] [address] [place], [place] [address]  Labs: BNP (last 3 results) No results for input(s): BNP in the last 8760 hours. Basic Metabolic Panel: Recent Labs  Lab 09/28/20 0327 09/29/20 0259 09/30/20 0241 10/01/20 0334 10/02/20 0257 10/03/20 0253  NA 137  137 138 138 138 138 137  K 4.7  4.7 4.0 3.5 3.7 3.8 3.6  CL 107  107 106 104 103 103 101  CO2 '25  26 25 27 28 30 27  '$ GLUCOSE 90  91 92 106* 111* 93 88  BUN 48*  45* 44* 37* 34* 35* 42*  CREATININE 2.53*  2.66* 2.09* 1.73* 1.78* 1.88* 1.99*  CALCIUM 7.2*  7.3* 7.3* 7.4* 7.5* 7.4* 7.4*  PHOS 5.4* 5.2* 4.7*  --   --   --    Liver Function Tests: Recent Labs  Lab 09/27/20 1330 09/28/20 0327 09/29/20 0259 09/30/20 0241  AST 16  --   --   --   ALT 18  --   --   --   ALKPHOS 54  --   --   --   BILITOT 0.5  --   --   --   PROT 3.3*  --   --   --   ALBUMIN <1.0* 1.5* 1.7* 1.5*   Recent Labs  Lab 09/27/20 1758  LIPASE 51   No results for input(s): AMMONIA in the last 168  hours. CBC: Recent Labs  Lab 09/27/20 1330 09/28/20 0327  WBC 13.8* 9.7  NEUTROABS 9.4*  --   HGB 10.7* 10.5*  HCT 31.7* 31.0*  MCV 86.8 87.1  PLT 437* 353   Cardiac Enzymes: No results for input(s): CKTOTAL, CKMB, CKMBINDEX, TROPONINI in the last 168 hours. BNP: Invalid input(s): POCBNP CBG: No results for input(s): GLUCAP in the last 168 hours. D-Dimer No results for input(s): DDIMER in the last 72 hours. Hgb A1c No results for input(s): HGBA1C in the last 72 hours. Lipid Profile No results for input(s): CHOL, HDL, LDLCALC, TRIG, CHOLHDL, LDLDIRECT in the last 72 hours. Thyroid function studies No results for input(s): TSH, T4TOTAL, T3FREE, THYROIDAB in the last 72 hours.  Invalid input(s): FREET3 Anemia work up No results for input(s): VITAMINB12, FOLATE, FERRITIN, TIBC, IRON, RETICCTPCT in the last 72 hours. Urinalysis    Component Value Date/Time   COLORURINE YELLOW 09/27/2020 1709   APPEARANCEUR HAZY (A) 09/27/2020 1709   LABSPEC 1.015 09/27/2020 1709   PHURINE 5.0 09/27/2020 1709   GLUCOSEU 50 (A) 09/27/2020 1709   HGBUR SMALL (A) 09/27/2020 1709   BILIRUBINUR NEGATIVE 09/27/2020 1709   KETONESUR NEGATIVE 09/27/2020 1709   PROTEINUR >=300 (A) 09/27/2020 1709   UROBILINOGEN 1.0 03/02/2019 1944   NITRITE NEGATIVE 09/27/2020 1709   LEUKOCYTESUR NEGATIVE 09/27/2020 1709   Sepsis Labs Invalid input(s): PROCALCITONIN,  WBC,  LACTICIDVEN Microbiology Recent Results (from the past 240 hour(s))  Resp Panel by RT-PCR (Flu A&B, Covid) Nasopharyngeal Swab     Status: None   Collection Time: 09/27/20  1:39 PM   Specimen: Nasopharyngeal Swab; Nasopharyngeal(NP) swabs in vial transport medium  Result Value Ref Range Status   SARS Coronavirus 2 by RT PCR NEGATIVE NEGATIVE Final    Comment: (NOTE) SARS-CoV-2 target nucleic acids are NOT DETECTED.  The SARS-CoV-2 RNA is generally detectable in upper respiratory specimens during the acute phase of infection. The  lowest concentration of SARS-CoV-2 viral copies this assay can detect is 138 copies/mL. A negative result does not preclude SARS-Cov-2 infection and should not be used as the sole basis for treatment or other patient  management decisions. A negative result may occur with  improper specimen collection/handling, submission of specimen other than nasopharyngeal swab, presence of viral mutation(s) within the areas targeted by this assay, and inadequate number of viral copies(<138 copies/mL). A negative result must be combined with clinical observations, patient history, and epidemiological information. The expected result is Negative.  Fact Sheet for Patients:  EntrepreneurPulse.com.au  Fact Sheet for Healthcare Providers:  IncredibleEmployment.be  This test is no t yet approved or cleared by the [place] FDA and  has been authorized for detection and/or diagnosis of SARS-CoV-2 by FDA under an Emergency Use Authorization (EUA). This EUA will remain  in effect (meaning this test can be used) for the duration of the COVID-19 declaration under Section 564(b)(1) of the Act, 21 U.S.C.section 360bbb-3(b)(1), unless the authorization is terminated  or revoked sooner.       Influenza A by PCR NEGATIVE NEGATIVE Final   Influenza B by PCR NEGATIVE NEGATIVE Final    Comment: (NOTE) The Xpert Xpress SARS-CoV-2/FLU/RSV plus assay is intended as an aid in the diagnosis of influenza from Nasopharyngeal swab specimens and should not be used as a sole basis for treatment. Nasal washings and aspirates are unacceptable for Xpert Xpress SARS-CoV-2/FLU/RSV testing.  Fact Sheet for Patients: EntrepreneurPulse.com.au  Fact Sheet for Healthcare Providers: IncredibleEmployment.be  This test is not yet approved or cleared by the [place] FDA and has been authorized for detection and/or diagnosis of SARS-CoV-2 by FDA under  an Emergency Use Authorization (EUA). This EUA will remain in effect (meaning this test can be used) for the duration of the COVID-19 declaration under Section 564(b)(1) of the Act, 21 U.S.C. section 360bbb-3(b)(1), unless the authorization is terminated or revoked.  Performed at [facility], [place] [address] [place], [place] [address]      Patient was seen and examined on the day of discharge and was found to be in stable condition. Time coordinating discharge: 25 minutes including assessment and coordination of care, as well as examination of the patient.   SIGNED:  Dessa Phi, DO [facility] 10/03/2020, 10:58 AM

## 2020-10-04 ENCOUNTER — Telehealth: Payer: Self-pay

## 2020-10-04 NOTE — Telephone Encounter (Signed)
Transition Care Management Unsuccessful Follow-up Telephone Call  Date of discharge and from where:  10/03/2020, [facility]   Attempts:  1st Attempt  Reason for unsuccessful TCM follow-up call:  Unable to reach patient - call placed to # [phone], phone rings, then goes to busy signal Call placed to # [phone], message stated that the call is not able to be completed at this time   Need to discuss scheduling a hospital follow up appointment with Ms Oletta Lamas, NP at [place]

## 2020-10-05 ENCOUNTER — Telehealth: Payer: Self-pay

## 2020-10-05 NOTE — Telephone Encounter (Signed)
Transition Care Management Unsuccessful Follow-up Telephone Call  Date of discharge and from where:  10/03/2020, [facility]   Attempts:  2nd Attempt  Reason for unsuccessful TCM follow-up call:  Unable to reach patient - call placed to # [phone], rings, then goes to fast busy signal.  Call placed to # [phone], message stated that the number is not in service.  Letter sent to patient requesting he call [place] to schedule a hospital follow up  appointment as we have not been able to reach him

## 2020-11-07 ENCOUNTER — Other Ambulatory Visit (HOSPITAL_COMMUNITY): Payer: Self-pay

## 2020-11-07 ENCOUNTER — Other Ambulatory Visit: Payer: Self-pay

## 2020-11-07 MED FILL — Atorvastatin Calcium Tab 80 MG (Base Equivalent): ORAL | 30 days supply | Qty: 30 | Fill #0 | Status: AC

## 2020-11-07 MED FILL — Lisinopril Tab 10 MG: ORAL | 30 days supply | Qty: 30 | Fill #0 | Status: AC

## 2020-11-07 MED FILL — Ezetimibe Tab 10 MG: ORAL | 30 days supply | Qty: 30 | Fill #0 | Status: AC

## 2020-11-07 MED FILL — Furosemide Tab 80 MG: ORAL | 30 days supply | Qty: 60 | Fill #0 | Status: AC

## 2021-05-12 IMAGING — XA IR FLUORO GUIDE CV LINE*R*
1 series · 1 of 1 positions shown · non-contrast
Comparison: Ultrasound-guided random renal biopsy-03/23/2019;

INDICATION: Acute on chronic nephrotic syndrome. Patient presents today for
ultrasound-guided random renal biopsy for tissue diagnostic
purposes. Of note, the patient underwent ultrasound-guided biopsy of
the right kidney on 03/22/2020.

Poor venous access. In need of durable intravenous access while
admitted to the hospital.
EXAM:
1. ULTRASOUND AND FLUOROSCOPIC GUIDED NON TUNNELED CENTRAL VENOUS
CATHETER INSERTION
2. ULTRASOUND-GUIDED BIOPSY OF THE INFERIOR POLE OF THE LEFT KIDNEY.

[Series 2: single · 1 of 1 slices shown]
[im 1/1]
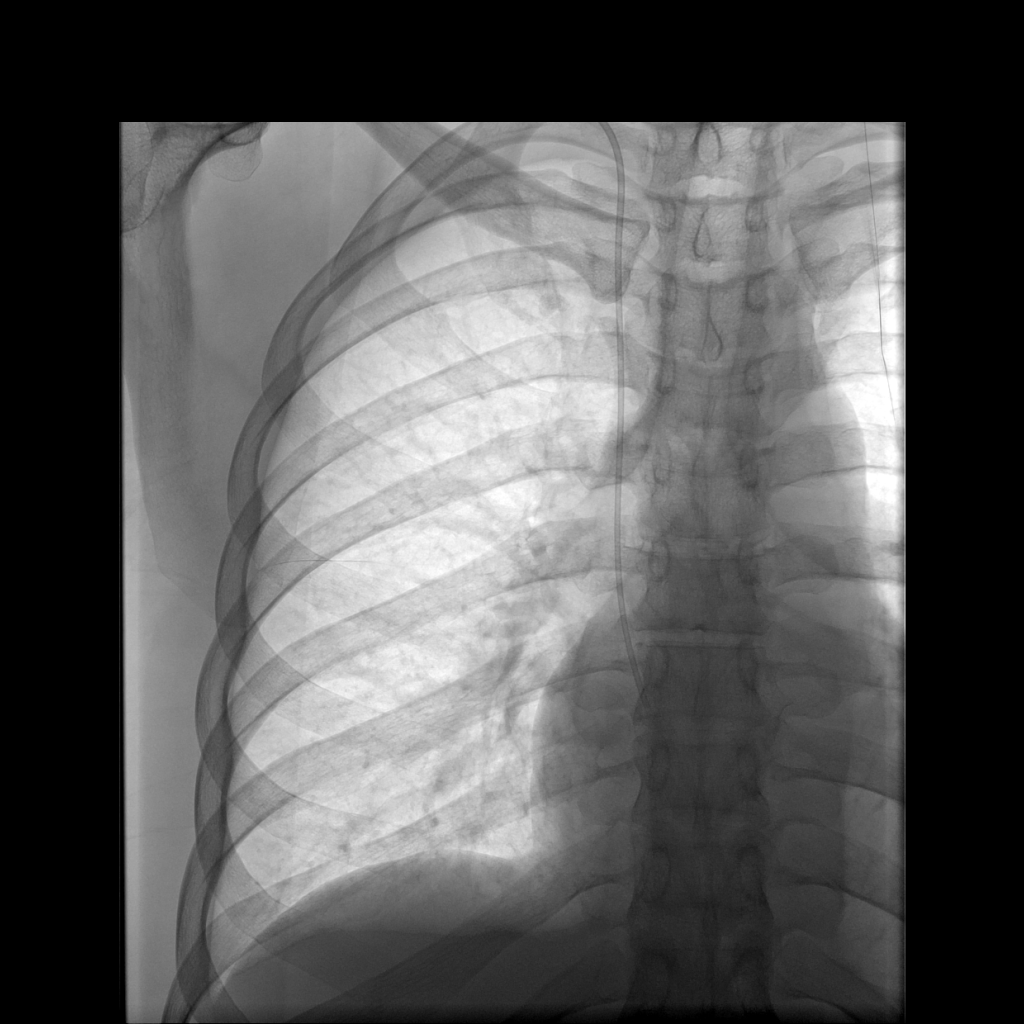

[1 of 1 positions shown; findings below may reference images not displayed]

CT
abdomen pelvis-08/27/2017

MEDICATIONS:
None

ANESTHESIA/SEDATION:
Moderate (conscious) sedation was employed during this procedure. A
total of Versed 2 mg and Fentanyl 75 mcg was administered
intravenously.

Moderate Sedation Time: 33 minutes. The patient's level of
consciousness and vital signs were monitored continuously by
radiology nursing throughout the procedure under my direct
supervision.

FLUOROSCOPY TIME:  6 seconds (2 mGy)

COMPLICATIONS:
SIR Level A - No therapy, no consequence.

Procedure complicated by development of a tiny asymptomatic
left-sided perinephric hematoma.

PROCEDURE:
Informed written consent was obtained from the patient after a
discussion of the risks, benefits and alternatives to treatment. The
patient understands and consents the procedure. A timeout was
performed prior to the initiation of the procedure.

The procedures, risks, benefits, and alternatives were explained to
the patient and informed written consent was obtained. A timeout was
performed prior to the initiation of the procedure.

The right neck and upper chest was prepped with chlorhexidine in a
sterile fashion, and a sterile drape was applied covering the
operative field. Maximum barrier sterile technique with sterile
gowns and gloves were used for the procedure. A timeout was
performed prior to the initiation of the procedure. Local anesthesia
was provided with 1% lidocaine.

Under direct ultrasound guidance, the right internal jugular vein
was accessed with a micropuncture kit after the overlying soft
tissues were anesthetized with 1% lidocaine.

After the overlying soft tissues were anesthetized, a small venotomy
incision was created and a micropuncture kit was utilized to access
the right right internal jugular vein. Real-time ultrasound guidance
was utilized for vascular access including the acquisition of a
permanent ultrasound image documenting patency of the accessed
vessel.

A guidewire was advanced to the level of the superior caval-atrial
junction for measurement purposes and the PICC line was cut to
length. A peel-away sheath was placed and a 18 cm, 5 French, dual
lumen was inserted to level of the superior caval-atrial junction. A
post procedure spot fluoroscopic was obtained. The catheter easily
aspirated and flushed and was secured in place with stat lock
device. A dressing was applied.

_________________________________________________________

Attention was now paid towards the ultrasound-guided random renal
biopsy.

Ultrasound scanning was performed of the bilateral flanks. The
inferior pole of the left kidney was selected for biopsy due to
location and sonographic window. Additionally, patient had
previously undergone ultrasound-guided biopsy of the inferior pole
the right kidney. The procedure was planned. The operative site was
prepped and draped in the usual sterile fashion. The overlying soft
tissues were anesthetized with 1% lidocaine with epinephrine. A 17
gauge core needle biopsy device was advanced into the inferior
cortex of the left kidney and 3 core biopsies were obtained under
direct ultrasound guidance. Images were saved for documentation
purposes. The biopsy device was removed and hemostasis was obtained
with manual compression. Post procedural scanning was negative for
significant post procedural hemorrhage or additional complication. A
dressing was placed. The patient tolerated the procedure well
without immediate post procedural complication.
FINDINGS: After catheter placement, the tip lies within the superior
cavoatrial junction. The catheter aspirates and flushes normally and
is ready for immediate use.

Sonographic evaluation confirms appropriate positioning of the
biopsy needle within the inferior pole of the left kidney.

Postprocedural imaging demonstrates development of a tiny
asymptomatic left-sided perinephric hematoma (image 8), not found to
enlarge on delayed sonographic evaluation.
IMPRESSION: 1. Technically successful ultrasound-guided biopsy of the inferior
pole of the left kidney. Procedure complicated by development of a
tiny asymptomatic left-sided perinephric hematoma.
2. Successful ultrasound and fluoroscopic guided placement of a
right internal jugular vein approach, 18 cm, 5 French, dual lumen
non tunneled central venous catheter with tip at the superior
caval-atrial junction. The PICC line is ready for immediate use.

## 2021-07-30 ENCOUNTER — Emergency Department (HOSPITAL_COMMUNITY)
Admission: EM | Admit: 2021-07-30 | Discharge: 2021-07-30 | Disposition: A | Discharge status: Home / Self Care | Payer: Medicaid Other | Attending: Emergency Medicine | Admitting: Emergency Medicine

## 2021-07-30 ENCOUNTER — Encounter (HOSPITAL_COMMUNITY): Payer: Self-pay | Admitting: Emergency Medicine

## 2021-07-30 DIAGNOSIS — Z20822 Contact with and (suspected) exposure to covid-19: Secondary | ICD-10-CM | POA: Diagnosis not present

## 2021-07-30 DIAGNOSIS — Z5321 Procedure and treatment not carried out due to patient leaving prior to being seen by health care provider: Secondary | ICD-10-CM | POA: Insufficient documentation

## 2021-07-30 DIAGNOSIS — R109 Unspecified abdominal pain: Secondary | ICD-10-CM | POA: Insufficient documentation

## 2021-07-30 DIAGNOSIS — R21 Rash and other nonspecific skin eruption: Secondary | ICD-10-CM | POA: Diagnosis not present

## 2021-07-30 DIAGNOSIS — M549 Dorsalgia, unspecified: Secondary | ICD-10-CM | POA: Diagnosis not present

## 2021-07-30 DIAGNOSIS — J3489 Other specified disorders of nose and nasal sinuses: Secondary | ICD-10-CM | POA: Insufficient documentation

## 2021-07-30 DIAGNOSIS — R509 Fever, unspecified: Secondary | ICD-10-CM | POA: Diagnosis not present

## 2021-07-30 LAB — CBC WITH DIFFERENTIAL/PLATELET
Abs Immature Granulocytes: 0.06 10*3/uL (ref 0.00–0.07)
Basophils Absolute: 0 10*3/uL (ref 0.0–0.1)
Basophils Relative: 0 %
Eosinophils Absolute: 0 10*3/uL (ref 0.0–0.5)
Eosinophils Relative: 0 %
HCT: 33.9 % — ABNORMAL LOW (ref 39.0–52.0)
Hemoglobin: 11.4 g/dL — ABNORMAL LOW (ref 13.0–17.0)
Immature Granulocytes: 0 %
Lymphocytes Relative: 5 %
Lymphs Abs: 0.7 10*3/uL (ref 0.7–4.0)
MCH: 29 pg (ref 26.0–34.0)
MCHC: 33.6 g/dL (ref 30.0–36.0)
MCV: 86.3 fL (ref 80.0–100.0)
Monocytes Absolute: 1.5 10*3/uL — ABNORMAL HIGH (ref 0.1–1.0)
Monocytes Relative: 11 %
Neutro Abs: 11.8 10*3/uL — ABNORMAL HIGH (ref 1.7–7.7)
Neutrophils Relative %: 84 %
Platelets: 325 10*3/uL (ref 150–400)
RBC: 3.93 MIL/uL — ABNORMAL LOW (ref 4.22–5.81)
RDW: 12.5 % (ref 11.5–15.5)
WBC: 14.1 10*3/uL — ABNORMAL HIGH (ref 4.0–10.5)
nRBC: 0 % (ref 0.0–0.2)

## 2021-07-30 LAB — COMPREHENSIVE METABOLIC PANEL
ALT: 10 U/L (ref 0–44)
AST: 19 U/L (ref 15–41)
Albumin: <1.5 g/dL — ABNORMAL LOW (ref 3.5–5.0)
Alkaline Phosphatase: 59 U/L (ref 38–126)
Anion gap: 7 (ref 5–15)
BUN: 35 mg/dL — ABNORMAL HIGH (ref 6–20)
CO2: 25 mmol/L (ref 22–32)
Calcium: 7 mg/dL — ABNORMAL LOW (ref 8.9–10.3)
Chloride: 100 mmol/L (ref 98–111)
Creatinine, Ser: 2.29 mg/dL — ABNORMAL HIGH (ref 0.61–1.24)
GFR, Estimated: 40 mL/min — ABNORMAL LOW (ref >60)
Glucose, Bld: 76 mg/dL (ref 70–99)
Potassium: 3.9 mmol/L (ref 3.5–5.1)
Sodium: 132 mmol/L — ABNORMAL LOW (ref 135–145)
Total Bilirubin: 0.4 mg/dL (ref 0.3–1.2)
Total Protein: 3.8 g/dL — ABNORMAL LOW (ref 6.5–8.1)

## 2021-07-30 LAB — RESP PANEL BY RT-PCR (FLU A&B, COVID) ARPGX2
Influenza A by PCR: NEGATIVE
Influenza B by PCR: NEGATIVE
SARS Coronavirus 2 by RT PCR: NEGATIVE

## 2021-07-30 LAB — LIPASE, BLOOD: Lipase: 30 U/L (ref 11–51)

## 2021-07-30 MED ORDER — ACETAMINOPHEN 325 MG PO TABS
650.0000 mg | ORAL_TABLET | Freq: Once | ORAL | Status: AC
  Administered 2021-07-30: 650 mg via ORAL
  Filled 2021-07-30: qty 2

## 2021-07-30 NOTE — ED Provider Triage Note (Signed)
Emergency Medicine Provider Triage Evaluation Note  Philip Trevino , a 26 y.o. male  was evaluated in triage.  Pt complains of fever, chills, abdominal pain, back pain, rash.  Patient states that symptoms have been present over the last few days have gotten worse today.  Patient reports that he took "medication for fever earlier this morning."  But does not remember what medication this was.  Patient states that today he developed a rash around his left eye.  States that in the past he has had rashes breakout when he is sick.  Review of Systems  Positive: Abdominal pain, fevers, chills, back pain, rash, rhinorrhea Negative: Nasal congestion, cough, nausea, vomiting, diarrhea, dysuria, hematuria, urinary urgency  Physical Exam  BP 117/72 (BP Location: Right Arm)    Pulse 97    Temp (!) 100.5 F (38.1 C) (Oral)    Resp 20    SpO2 99%  Gen:   Awake, no distress   Resp:  Normal effort, lungs clear to auscultation bilaterally MSK:   Moves extremities without difficulty  Other:  Abdomen soft, nondistended, mild generalized tenderness throughout abdomen.  No guarding or rebound tenderness.  Patient has vesicular rash around the left eye.  Medical Decision Making  Medically screening exam initiated at 5:17 PM.  Appropriate orders placed.  Philip Trevino was informed that the remainder of the evaluation will be completed by another provider, this initial triage assessment does not replace that evaluation, and the importance of remaining in the ED until their evaluation is complete.  Due to concern for ocular shingles patient made level 2 and will move back to next available room.   Loni Beckwith, [place] 07/30/21 1722

## 2021-07-30 NOTE — ED Triage Notes (Signed)
Patient complains of aches to back, chest, and abdomen that hurt with movement. Patient also complains of pain when he tries to talk too loudly. Patient alert, oriented, and in no apparent distress at this time.

## 2021-07-30 NOTE — ED Notes (Signed)
Patient states the wait is too long and is leaving. Refusing for vitals to be updated before he left

## 2021-08-14 ENCOUNTER — Emergency Department (HOSPITAL_COMMUNITY): Payer: Medicaid Other

## 2021-08-14 ENCOUNTER — Other Ambulatory Visit: Payer: Self-pay

## 2021-08-14 ENCOUNTER — Inpatient Hospital Stay (HOSPITAL_COMMUNITY)
Admission: EM | Admit: 2021-08-14 | Discharge: 2021-08-25 | DRG: 981 | Disposition: A | Discharge status: Home / Self Care | Payer: Medicaid Other | Attending: Family Medicine | Admitting: Family Medicine

## 2021-08-14 ENCOUNTER — Encounter (HOSPITAL_COMMUNITY): Payer: Self-pay | Admitting: Emergency Medicine

## 2021-08-14 DIAGNOSIS — Z91199 Patient's noncompliance with other medical treatment and regimen due to unspecified reason: Secondary | ICD-10-CM | POA: Diagnosis not present

## 2021-08-14 DIAGNOSIS — Z28310 Unvaccinated for covid-19: Secondary | ICD-10-CM

## 2021-08-14 DIAGNOSIS — R627 Adult failure to thrive: Secondary | ICD-10-CM | POA: Diagnosis present

## 2021-08-14 DIAGNOSIS — D849 Immunodeficiency, unspecified: Secondary | ICD-10-CM | POA: Diagnosis present

## 2021-08-14 DIAGNOSIS — E785 Hyperlipidemia, unspecified: Secondary | ICD-10-CM | POA: Diagnosis present

## 2021-08-14 DIAGNOSIS — D72829 Elevated white blood cell count, unspecified: Secondary | ICD-10-CM | POA: Diagnosis present

## 2021-08-14 DIAGNOSIS — J918 Pleural effusion in other conditions classified elsewhere: Secondary | ICD-10-CM | POA: Diagnosis present

## 2021-08-14 DIAGNOSIS — D75838 Other thrombocytosis: Secondary | ICD-10-CM | POA: Diagnosis present

## 2021-08-14 DIAGNOSIS — Z87441 Personal history of nephrotic syndrome: Secondary | ICD-10-CM

## 2021-08-14 DIAGNOSIS — D631 Anemia in chronic kidney disease: Secondary | ICD-10-CM | POA: Diagnosis present

## 2021-08-14 DIAGNOSIS — J13 Pneumonia due to Streptococcus pneumoniae: Secondary | ICD-10-CM | POA: Diagnosis not present

## 2021-08-14 DIAGNOSIS — Z6823 Body mass index (BMI) 23.0-23.9, adult: Secondary | ICD-10-CM

## 2021-08-14 DIAGNOSIS — Z20822 Contact with and (suspected) exposure to covid-19: Secondary | ICD-10-CM | POA: Diagnosis present

## 2021-08-14 DIAGNOSIS — I959 Hypotension, unspecified: Secondary | ICD-10-CM | POA: Diagnosis present

## 2021-08-14 DIAGNOSIS — E782 Mixed hyperlipidemia: Secondary | ICD-10-CM | POA: Diagnosis present

## 2021-08-14 DIAGNOSIS — N179 Acute kidney failure, unspecified: Secondary | ICD-10-CM | POA: Diagnosis not present

## 2021-08-14 DIAGNOSIS — I129 Hypertensive chronic kidney disease with stage 1 through stage 4 chronic kidney disease, or unspecified chronic kidney disease: Secondary | ICD-10-CM | POA: Diagnosis present

## 2021-08-14 DIAGNOSIS — N390 Urinary tract infection, site not specified: Secondary | ICD-10-CM | POA: Diagnosis present

## 2021-08-14 DIAGNOSIS — M7989 Other specified soft tissue disorders: Secondary | ICD-10-CM | POA: Diagnosis not present

## 2021-08-14 DIAGNOSIS — N170 Acute kidney failure with tubular necrosis: Principal | ICD-10-CM | POA: Diagnosis present

## 2021-08-14 DIAGNOSIS — J9383 Other pneumothorax: Secondary | ICD-10-CM | POA: Diagnosis not present

## 2021-08-14 DIAGNOSIS — Z9889 Other specified postprocedural states: Secondary | ICD-10-CM

## 2021-08-14 DIAGNOSIS — K219 Gastro-esophageal reflux disease without esophagitis: Secondary | ICD-10-CM | POA: Diagnosis present

## 2021-08-14 DIAGNOSIS — J9601 Acute respiratory failure with hypoxia: Secondary | ICD-10-CM | POA: Diagnosis not present

## 2021-08-14 DIAGNOSIS — Z9114 Patient's other noncompliance with medication regimen: Secondary | ICD-10-CM

## 2021-08-14 DIAGNOSIS — Z4682 Encounter for fitting and adjustment of non-vascular catheter: Secondary | ICD-10-CM | POA: Diagnosis not present

## 2021-08-14 DIAGNOSIS — E440 Moderate protein-calorie malnutrition: Secondary | ICD-10-CM | POA: Diagnosis present

## 2021-08-14 DIAGNOSIS — D75839 Thrombocytosis, unspecified: Secondary | ICD-10-CM

## 2021-08-14 DIAGNOSIS — A491 Streptococcal infection, unspecified site: Secondary | ICD-10-CM | POA: Diagnosis not present

## 2021-08-14 DIAGNOSIS — F17200 Nicotine dependence, unspecified, uncomplicated: Secondary | ICD-10-CM | POA: Diagnosis present

## 2021-08-14 DIAGNOSIS — Z7952 Long term (current) use of systemic steroids: Secondary | ICD-10-CM

## 2021-08-14 DIAGNOSIS — Z7982 Long term (current) use of aspirin: Secondary | ICD-10-CM

## 2021-08-14 DIAGNOSIS — N184 Chronic kidney disease, stage 4 (severe): Secondary | ICD-10-CM | POA: Diagnosis present

## 2021-08-14 DIAGNOSIS — J939 Pneumothorax, unspecified: Secondary | ICD-10-CM

## 2021-08-14 DIAGNOSIS — J90 Pleural effusion, not elsewhere classified: Secondary | ICD-10-CM | POA: Diagnosis not present

## 2021-08-14 DIAGNOSIS — N185 Chronic kidney disease, stage 5: Secondary | ICD-10-CM | POA: Diagnosis not present

## 2021-08-14 DIAGNOSIS — J869 Pyothorax without fistula: Secondary | ICD-10-CM | POA: Diagnosis present

## 2021-08-14 DIAGNOSIS — D649 Anemia, unspecified: Secondary | ICD-10-CM | POA: Diagnosis present

## 2021-08-14 DIAGNOSIS — E8809 Other disorders of plasma-protein metabolism, not elsewhere classified: Secondary | ICD-10-CM | POA: Diagnosis present

## 2021-08-14 DIAGNOSIS — N269 Renal sclerosis, unspecified: Secondary | ICD-10-CM | POA: Diagnosis present

## 2021-08-14 DIAGNOSIS — N189 Chronic kidney disease, unspecified: Secondary | ICD-10-CM | POA: Diagnosis present

## 2021-08-14 DIAGNOSIS — J154 Pneumonia due to other streptococci: Secondary | ICD-10-CM | POA: Diagnosis not present

## 2021-08-14 DIAGNOSIS — Z9689 Presence of other specified functional implants: Secondary | ICD-10-CM

## 2021-08-14 DIAGNOSIS — Z79899 Other long term (current) drug therapy: Secondary | ICD-10-CM

## 2021-08-14 DIAGNOSIS — J9811 Atelectasis: Secondary | ICD-10-CM | POA: Diagnosis present

## 2021-08-14 DIAGNOSIS — N19 Unspecified kidney failure: Secondary | ICD-10-CM

## 2021-08-14 DIAGNOSIS — R0781 Pleurodynia: Secondary | ICD-10-CM | POA: Diagnosis present

## 2021-08-14 DIAGNOSIS — E871 Hypo-osmolality and hyponatremia: Secondary | ICD-10-CM | POA: Diagnosis present

## 2021-08-14 DIAGNOSIS — J9600 Acute respiratory failure, unspecified whether with hypoxia or hypercapnia: Secondary | ICD-10-CM

## 2021-08-14 DIAGNOSIS — R140 Abdominal distension (gaseous): Secondary | ICD-10-CM

## 2021-08-14 DIAGNOSIS — I10 Essential (primary) hypertension: Secondary | ICD-10-CM | POA: Diagnosis present

## 2021-08-14 LAB — CBC WITH DIFFERENTIAL/PLATELET
Abs Immature Granulocytes: 0.21 10*3/uL — ABNORMAL HIGH (ref 0.00–0.07)
Basophils Absolute: 0.1 10*3/uL (ref 0.0–0.1)
Basophils Relative: 0 %
Eosinophils Absolute: 0 10*3/uL (ref 0.0–0.5)
Eosinophils Relative: 0 %
HCT: 26.4 % — ABNORMAL LOW (ref 39.0–52.0)
Hemoglobin: 8.7 g/dL — ABNORMAL LOW (ref 13.0–17.0)
Immature Granulocytes: 1 %
Lymphocytes Relative: 7 %
Lymphs Abs: 1.8 10*3/uL (ref 0.7–4.0)
MCH: 29.2 pg (ref 26.0–34.0)
MCHC: 33 g/dL (ref 30.0–36.0)
MCV: 88.6 fL (ref 80.0–100.0)
Monocytes Absolute: 1.8 10*3/uL — ABNORMAL HIGH (ref 0.1–1.0)
Monocytes Relative: 7 %
Neutro Abs: 21.1 10*3/uL — ABNORMAL HIGH (ref 1.7–7.7)
Neutrophils Relative %: 85 %
Platelets: 1082 10*3/uL (ref 150–400)
RBC: 2.98 MIL/uL — ABNORMAL LOW (ref 4.22–5.81)
RDW: 11.9 % (ref 11.5–15.5)
WBC: 25 10*3/uL — ABNORMAL HIGH (ref 4.0–10.5)
nRBC: 0 % (ref 0.0–0.2)

## 2021-08-14 LAB — COMPREHENSIVE METABOLIC PANEL
ALT: 12 U/L (ref 0–44)
AST: 33 U/L (ref 15–41)
Albumin: <1.5 g/dL — ABNORMAL LOW (ref 3.5–5.0)
Alkaline Phosphatase: 79 U/L (ref 38–126)
Anion gap: 10 (ref 5–15)
BUN: 76 mg/dL — ABNORMAL HIGH (ref 6–20)
CO2: 21 mmol/L — ABNORMAL LOW (ref 22–32)
Calcium: 7.1 mg/dL — ABNORMAL LOW (ref 8.9–10.3)
Chloride: 102 mmol/L (ref 98–111)
Creatinine, Ser: 6.41 mg/dL — ABNORMAL HIGH (ref 0.61–1.24)
GFR, Estimated: 12 mL/min — ABNORMAL LOW (ref >60)
Glucose, Bld: 100 mg/dL — ABNORMAL HIGH (ref 70–99)
Potassium: 3.9 mmol/L (ref 3.5–5.1)
Sodium: 133 mmol/L — ABNORMAL LOW (ref 135–145)
Total Bilirubin: 0.2 mg/dL — ABNORMAL LOW (ref 0.3–1.2)
Total Protein: 5.9 g/dL — ABNORMAL LOW (ref 6.5–8.1)

## 2021-08-14 LAB — HIV ANTIBODY (ROUTINE TESTING W REFLEX): HIV Screen 4th Generation wRfx: NONREACTIVE

## 2021-08-14 LAB — LIPASE, BLOOD: Lipase: 45 U/L (ref 11–51)

## 2021-08-14 LAB — TROPONIN I (HIGH SENSITIVITY)
Troponin I (High Sensitivity): 17 ng/L (ref <18)
Troponin I (High Sensitivity): 46 ng/L — ABNORMAL HIGH (ref <18)

## 2021-08-14 LAB — RESP PANEL BY RT-PCR (FLU A&B, COVID) ARPGX2
Influenza A by PCR: NEGATIVE
Influenza B by PCR: NEGATIVE
SARS Coronavirus 2 by RT PCR: NEGATIVE

## 2021-08-14 LAB — MAGNESIUM: Magnesium: 2.7 mg/dL — ABNORMAL HIGH (ref 1.7–2.4)

## 2021-08-14 MED ORDER — ATORVASTATIN CALCIUM 80 MG PO TABS
80.0000 mg | ORAL_TABLET | Freq: Every day | ORAL | Status: DC
  Administered 2021-08-14 – 2021-08-25 (×11): 80 mg via ORAL
  Filled 2021-08-14 (×2): qty 1
  Filled 2021-08-14: qty 2
  Filled 2021-08-14: qty 1
  Filled 2021-08-14: qty 2
  Filled 2021-08-14 (×6): qty 1

## 2021-08-14 MED ORDER — EZETIMIBE 10 MG PO TABS
10.0000 mg | ORAL_TABLET | Freq: Every day | ORAL | Status: DC
  Administered 2021-08-14 – 2021-08-25 (×11): 10 mg via ORAL
  Filled 2021-08-14 (×11): qty 1

## 2021-08-14 MED ORDER — HEPARIN SODIUM (PORCINE) 5000 UNIT/ML IJ SOLN
5000.0000 [IU] | Freq: Three times a day (TID) | INTRAMUSCULAR | Status: DC
  Administered 2021-08-14: 22:00:00 5000 [IU] via SUBCUTANEOUS
  Filled 2021-08-14 (×6): qty 1

## 2021-08-14 MED ORDER — SODIUM CHLORIDE 0.9 % IV BOLUS
1000.0000 mL | Freq: Once | INTRAVENOUS | Status: AC
  Administered 2021-08-14: 18:00:00 1000 mL via INTRAVENOUS

## 2021-08-14 MED ORDER — ACETAMINOPHEN 650 MG RE SUPP: 650.0000 mg | Freq: Four times a day (QID) | RECTAL | Status: DC | PRN

## 2021-08-14 MED ORDER — ACETAMINOPHEN 325 MG PO TABS
650.0000 mg | ORAL_TABLET | Freq: Four times a day (QID) | ORAL | Status: DC | PRN
  Administered 2021-08-15 – 2021-08-18 (×3): 650 mg via ORAL
  Filled 2021-08-14 (×4): qty 2

## 2021-08-14 MED ORDER — LACTATED RINGERS IV SOLN: INTRAVENOUS | Status: DC

## 2021-08-14 NOTE — ED Provider Notes (Addendum)
[facility] EMERGENCY DEPARTMENT Provider Note   CSN: [id number] Arrival date & time: 08/14/21  4098     History  Chief Complaint  Patient presents with   Chest Pain   loss of taste   Anorexia    Philip Trevino is a 26 y.o. male.  HPI  26 year old male with past medical history of focal segmental glomerulosclerosis noncompliant with outpatient nephrology care/medications presents to the emergency department with concern for lack of taste, decreased p.o. intake, chest pain, nonproductive cough.  Patient is nonspecific in his complaints, states the symptoms have been ongoing for "a while".  He has had mild nausea but denies any vomiting or diarrhea.  No specific abdominal/flank pain.  Denies any difficulty with urination.  States that the cough is nonproductive.  No phlegm production or hematemesis.  Patient feels fatigued and overall unwell.  Home Medications Prior to Admission medications   Medication Sig Start Date End Date Taking? Authorizing Provider  aspirin 81 MG chewable tablet Chew 1 tablet (81 mg total) by mouth daily. Patient not taking: Reported on 07/30/2021 10/03/20   Dessa Phi, DO  aspirin 81 MG chewable tablet CHEW 1 TABLET (81 MG TOTAL) BY MOUTH DAILY. Patient not taking: Reported on 07/30/2021 10/03/20 10/03/21  Dessa Phi, DO  atorvastatin (LIPITOR) 80 MG tablet Take 1 tablet (80 mg total) by mouth daily. Patient not taking: Reported on 07/30/2021 10/04/20   Dessa Phi, DO  atorvastatin (LIPITOR) 80 MG tablet TAKE 1 TABLET (80 MG TOTAL) BY MOUTH DAILY. Patient not taking: Reported on 07/30/2021 10/03/20 10/03/21  Dessa Phi, DO  ezetimibe (ZETIA) 10 MG tablet Take 1 tablet (10 mg total) by mouth daily. Patient not taking: Reported on 07/30/2021 10/04/20   Dessa Phi, DO  ezetimibe (ZETIA) 10 MG tablet TAKE 1 TABLET (10 MG TOTAL) BY MOUTH DAILY. Patient not taking: Reported on 07/30/2021 10/03/20 10/03/21  Dessa Phi, DO  furosemide (LASIX)  80 MG tablet Take 1 tablet (80 mg total) by mouth 2 (two) times daily. 10/03/20 01/01/21  Dessa Phi, DO  furosemide (LASIX) 80 MG tablet TAKE 1 TABLET (80 MG TOTAL) BY MOUTH 2 (TWO) TIMES DAILY. Patient not taking: Reported on 07/30/2021 10/03/20 10/03/21  Dessa Phi, DO  lisinopril (ZESTRIL) 10 MG tablet Take 1 tablet (10 mg total) by mouth daily. Patient not taking: Reported on 07/30/2021 10/04/20   Dessa Phi, DO  lisinopril (ZESTRIL) 10 MG tablet TAKE 1 TABLET (10 MG TOTAL) BY MOUTH DAILY. Patient not taking: Reported on 07/30/2021 10/03/20 10/03/21  Dessa Phi, DO  Pseudoeph-CPM-DM-APAP [facility] FLU/COLD/COUGH PO) Take 1 capsule by mouth daily as needed (For cold symptoms).    [provider]      Allergies    Eual Fines Marta Lamas oleracea]    Review of Systems   Review of Systems  Constitutional:  Positive for appetite change and fatigue. Negative for fever.  Respiratory:  Positive for cough. Negative for shortness of breath.   Cardiovascular:  Positive for chest pain. Negative for palpitations and leg swelling.  Gastrointestinal:  Positive for nausea. Negative for abdominal pain, diarrhea and vomiting.  Genitourinary:  Negative for difficulty urinating and flank pain.  Musculoskeletal:  Negative for back pain.  Skin:  Negative for rash.  Neurological:  Negative for headaches.   Physical Exam Updated Vital Signs BP 131/86    Pulse 93    Temp 99 F (37.2 C) (Oral)    Resp 16    SpO2 100%  Physical Exam Vitals and nursing  note reviewed.  Constitutional:      General: He is not in acute distress.    Appearance: Normal appearance. He is not diaphoretic.  HENT:     Head: Normocephalic.     Mouth/Throat:     Mouth: Mucous membranes are moist.  Cardiovascular:     Rate and Rhythm: Normal rate.  Pulmonary:     Effort: Pulmonary effort is normal. No respiratory distress.  Abdominal:     Palpations: Abdomen is soft.     Tenderness: There is no abdominal tenderness.   Skin:    General: Skin is warm.  Neurological:     Mental Status: He is alert and oriented to person, place, and time. Mental status is at baseline.  Psychiatric:        Mood and Affect: Mood normal.    ED Results / Procedures / Treatments   Labs (all labs ordered are listed, but only abnormal results are displayed) Labs Reviewed  COMPREHENSIVE METABOLIC PANEL - Abnormal; Notable for the following components:      Result Value   Sodium 133 (*)    CO2 21 (*)    Glucose, Bld 100 (*)    BUN 76 (*)    Creatinine, Ser 6.41 (*)    Calcium 7.1 (*)    Total Protein 5.9 (*)    Albumin <1.5 (*)    Total Bilirubin 0.2 (*)    GFR, Estimated 12 (*)    All other components within normal limits  CBC WITH DIFFERENTIAL/PLATELET - Abnormal; Notable for the following components:   WBC 25.0 (*)    RBC 2.98 (*)    Hemoglobin 8.7 (*)    HCT 26.4 (*)    Platelets 1,082 (*)    Neutro Abs 21.1 (*)    Monocytes Absolute 1.8 (*)    Abs Immature Granulocytes 0.21 (*)    All other components within normal limits  MAGNESIUM - Abnormal; Notable for the following components:   Magnesium 2.7 (*)    All other components within normal limits  RESP PANEL BY RT-PCR (FLU A&B, COVID) ARPGX2  LIPASE, BLOOD  URINALYSIS, ROUTINE W REFLEX MICROSCOPIC  PATHOLOGIST SMEAR REVIEW  TROPONIN I (HIGH SENSITIVITY)  TROPONIN I (HIGH SENSITIVITY)    EKG EKG Interpretation  Date/Time:  Tuesday August 14 2021 09:12:06 EST Ventricular Rate:  97 PR Interval:  122 QRS Duration: 74 QT Interval:  346 QTC Calculation: 439 R Axis:   48 Text Interpretation: Normal sinus rhythm Normal ECG When compared with ECG of 27-Jan-2020 20:18, PREVIOUS ECG IS PRESENT Confirmed by Lavenia Atlas [phone]) on 08/14/2021 4:16:09 PM  Radiology DG Chest 2 View  Result Date: 08/14/2021 CLINICAL DATA:  Chest pain. EXAM: CHEST - 2 VIEW COMPARISON:  09/27/2020 FINDINGS: Bibasilar chest densities, left side greater than right. Findings are  suggestive for bilateral pleural effusions. Suspect compressive atelectasis but cannot exclude basilar airspace disease or consolidation. Upper lungs are clear. Heart size is incompletely evaluated due to the basilar chest densities. Negative for pneumothorax. IMPRESSION: 1. Bibasilar chest densities, left side greater than right. Findings are suggestive for pleural effusions with compressive atelectasis or consolidation. Left pleural effusion could be moderate in size. Electronically Signed   By: Markus Daft M.D.   On: 08/14/2021 10:02    Procedures .Critical Care Performed by: Lorelle Gibbs, DO Authorized by: Lorelle Gibbs, DO   Critical care provider statement:    Critical care time (minutes):  60   Critical care was necessary to treat  or prevent imminent or life-threatening deterioration of the following conditions:  Renal failure, dehydration and metabolic crisis   Critical care was time spent personally by me on the following activities:  Development of treatment plan with patient or surrogate, discussions with consultants, evaluation of patient's response to treatment, examination of patient, ordering and review of laboratory studies, ordering and review of radiographic studies, ordering and performing treatments and interventions, pulse oximetry, re-evaluation of patient's condition and review of old charts   I assumed direction of critical care for this patient from another provider in my specialty: no     Care discussed with: admitting provider      Medications Ordered in ED Medications - No data to display  ED Course/ Medical Decision Making/ A&P                           Medical Decision Making Amount and/or Complexity of Data Reviewed Radiology: ordered.  Risk Decision regarding hospitalization.   This patient presents to the ED for concern of loss of taste, decreased appetite, chest pain dry cough, this involves an extensive number of treatment options, and is a  complaint that carries with it a high risk of complications and morbidity.  The differential diagnosis includes respiratory infection, pneumonia, worsening kidney dysfunction, viral illness, abdominal pathology   Additional history obtained: -External records from outside source obtained and reviewed including: Chart review including previous notes, labs, imaging, consultation notes   Lab Tests: -I ordered, reviewed, and interpreted labs.  The pertinent results include: Acutely worsening renal dysfunction/failure with no electrolyte abnormalities, leukocytosis with thrombocytosis   EKG -Normal sinus rhythm   Imaging Studies ordered: -I ordered imaging studies including renal ultrasound which is pending   Medicines ordered and prescription drug management: -I ordered medication including IV fluids for renal failure -Reevaluation of the patient after these medicines showed that the patient stayed the same -I have reviewed the patients home medicines and have made adjustments as needed   Consultations Obtained: I requested consultation with the nephrology Dr. Moshe Cipro and hematology,  and discussed lab and imaging findings as well as pertinent plan - they recommend: Nephrology recommends renal ultrasound and will see him as an inpatient.  Hematology recommends trending the leukocytosis and thrombocytosis, they believe that this is most likely inflammatory/reactive to the renal failure.   ED Course: 26 year old male with past medical history of FSGS with no outpatient nephrology follow-up presents emergency department with decreased p.o. intake, decreased taste, chest pain and cough.  Vitals are stable on arrival.  Work-up is concerning for worsening renal failure, leukocytosis with significant thrombocytosis.  Chest x-ray with bilateral pleural effusions and possible infection.  Flu and COVID is pending.  IV fluids have been started, ultrasound renal has been ordered per nephrology  recommendations.  We are pending hematology consult and recommendations due to the thrombocytosis and high risk that this carries.  Patient will require medical admission.   Critical Interventions: IV fluids, nephrology and hematology consult   Cardiac Monitoring: The patient was maintained on a cardiac monitor.  I personally viewed and interpreted the cardiac monitored which showed an underlying rhythm of: Sinus rhythm   Reevaluation: After the interventions noted above, I reevaluated the patient and found that they have :stayed the same   Dispostion: Patients evaluation and results requires admission for further treatment and care.  Spoke with hospitalist Dr. Kurtis Bushman, reviewed patient's ED course and they accept admission.  Patient agrees with admission  plan, offers no new complaints and is stable/unchanged at time of admit.        Final Clinical Impression(s) / ED Diagnoses Final diagnoses:  Bloated abdomen    Rx / DC Orders ED Discharge Orders     None         Lorelle Gibbs, DO 08/14/21 1721    Jolan Mealor, Alvin Critchley, DO 08/14/21 1731

## 2021-08-14 NOTE — ED Provider Triage Note (Signed)
Emergency Medicine Provider Triage Evaluation Note  Philip Trevino , a 26 y.o. male  was evaluated in triage.  Pt complains of multiple symptoms.  He reports that he has been taking his prednisone, he doesn't know his other meds.   He reports about three weeks of fatigue and poor appetite, he has pain in his chest when he coughs it hurts.  He reports that he isn't having much swelling.   He thinks he may be having fevers.   He doesn't have a nephrologist.  He states that he had a few no-shows and was dismissed from the practice.  He attributes that to social issues of "moving around."  He also states that at that point he did not have insurance but now as far as he knows he has [employer].   Physical Exam  BP (!) 127/91 (BP Location: Left Arm)    Pulse 99    Temp 99 F (37.2 C) (Oral)    Resp 16    SpO2 98%  Gen:   Awake, no distress   Resp:  Normal effort  MSK:   Moves extremities without difficulty  Other:   Flat affect, normal speech  Medical Decision Making  Medically screening exam initiated at 9:28 AM.  Appropriate orders placed.  Philip Trevino was informed that the remainder of the evaluation will be completed by another provider, this initial triage assessment does not replace that evaluation, and the importance of remaining in the ED until their evaluation is complete.  Labs, chest x-ray, EKG and urine ordered. Encourage patient to recall [facility] where he was previously seen, explained the situation and ask them if they can possibly see him again as a patient.   [name], [place] 08/14/21 [phone]

## 2021-08-14 NOTE — ED Notes (Signed)
Pt provided 2 waters.

## 2021-08-14 NOTE — Consult Note (Signed)
[facility] Renal Consultation Note  Requesting MD: Amery Indication for Consultation: Philip Trevino on CRF  HPI:  Philip Trevino is Philip Trevino 26 y.o. 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  Since then his course has been hallmarked by non compliance and frequent flaring-  last seen by Dr. Hollie Salk in April of 22 after flare and crt was 2.2-  then lost to follow up- admits to medication non compliance-  interestingly , there are some labs in the system from October /November of 2022 where crt was normal ? Pt tells me that he has taken prednisone off and on-  cannot remember the name of his other medications nor the pharmacy.  He came to ER with c/o decreased taste on 07/30/21 crt was 2.2 ( same as in April 2022)  he continued to have sxms so returned to ER today and crt was 6.4.  He denies any major change in UOP-  no blood or change in volume-  says his swelling is really good even though he has 1+edema-  admits to taking ibuprofen 4-5 times Philip Trevino week- denies drug use or any other changes in meds but again cannot really tell me what he has been taking.  There is no urine available for review-  ultrasound was negative for obstruction   Creatinine, Ser  Date/Time Value Ref Range Status  08/14/2021 09:38 AM 6.41 (H) 0.61 - 1.24 mg/dL Final  07/30/2021 05:17 PM 2.29 (H) 0.61 - 1.24 mg/dL Final  10/03/2020 02:53 AM 1.99 (H) 0.61 - 1.24 mg/dL Final  10/02/2020 02:57 AM 1.88 (H) 0.61 - 1.24 mg/dL Final  10/01/2020 03:34 AM 1.78 (H) 0.61 - 1.24 mg/dL Final  09/30/2020 02:41 AM 1.73 (H) 0.61 - 1.24 mg/dL Final  09/29/2020 02:59 AM 2.09 (H) 0.61 - 1.24 mg/dL Final  09/28/2020 03:27 AM 2.53 (H) 0.61 - 1.24 mg/dL Final   09/28/2020 03:27 AM 2.66 (H) 0.61 - 1.24 mg/dL Final  09/27/2020 01:30 PM 3.10 (H) 0.61 - 1.24 mg/dL Final  02/10/2020 04:25 PM 0.95 0.76 - 1.27 mg/dL Final  02/02/2020 02:56 AM 1.28 (H) 0.61 - 1.24 mg/dL Final  02/01/2020 04:22 AM 1.43 (H) 0.61 - 1.24 mg/dL Final  01/30/2020 02:18 AM 2.42 (H) 0.61 - 1.24 mg/dL Final  01/29/2020 03:33 AM 2.38 (H) 0.61 - 1.24 mg/dL Final  01/28/2020 05:13 AM 2.25 (H) 0.61 - 1.24 mg/dL Final  01/27/2020 08:28 PM 2.34 (H) 0.61 - 1.24 mg/dL Final  03/04/2019 05:01 PM 0.95 0.61 - 1.24 mg/dL Final  03/02/2019 07:35 PM 0.94 0.61 - 1.24 mg/dL Final  08/26/2017 11:53 PM 1.03 0.61 - 1.24 mg/dL Final  08/06/2017 09:08 PM 1.06 0.61 - 1.24 mg/dL Final     PMHx:   Past Medical History:  Diagnosis Date   Nephrotic syndrome     Past Surgical History:  Procedure Laterality Date   IR FLUORO GUIDE CV LINE RIGHT  01/31/2020   IR US GUIDE VASC ACCESS RIGHT  01/31/2020   TONSILLECTOMY      Family Hx:  Family History  Problem Relation Age of Onset   Healthy Mother    Healthy Father     Social History:  reports that he has been smoking.  He has never used smokeless tobacco. He reports that he does not currently use alcohol. He reports current drug use. Drug: Marijuana.  Allergies:  Allergies  Allergen Reactions   Broccoli [Brassica Oleracea] Hives    CAULIFLOWER (allergic, also)    Medications: Prior to Admission medications   Medication Sig Start Date End Date Taking? Authorizing Provider  furosemide (LASIX) 80 MG tablet Take 1 tablet (80 mg total) by mouth 2 (two) times daily. Patient taking differently: Take 80 mg by mouth daily. 10/03/20 08/14/21 Yes Dessa Phi, DO  predniSONE (DELTASONE) 20 MG tablet Take 60 mg by mouth daily with breakfast. Continuously   Yes [provider]  Pseudoeph-CPM-DM-APAP (THERAFLU FLU/COLD/COUGH PO) Take 1 capsule by mouth daily as needed (For cold symptoms).   Yes [provider]  aspirin 81 MG  chewable tablet Chew 1 tablet (81 mg total) by mouth daily. Patient not taking: Reported on 07/30/2021 10/03/20   Dessa Phi, DO  aspirin 81 MG chewable tablet CHEW 1 TABLET (81 MG TOTAL) BY MOUTH DAILY. Patient not taking: Reported on 07/30/2021 10/03/20 10/03/21  Dessa Phi, DO  atorvastatin (LIPITOR) 80 MG tablet Take 1 tablet (80 mg total) by mouth daily. Patient not taking: Reported on 07/30/2021 10/04/20   Dessa Phi, DO  atorvastatin (LIPITOR) 80 MG tablet TAKE 1 TABLET (80 MG TOTAL) BY MOUTH DAILY. Patient not taking: Reported on 07/30/2021 10/03/20 10/03/21  Dessa Phi, DO  ezetimibe (ZETIA) 10 MG tablet Take 1 tablet (10 mg total) by mouth daily. Patient not taking: Reported on 07/30/2021 10/04/20   Dessa Phi, DO  ezetimibe (ZETIA) 10 MG tablet TAKE 1 TABLET (10 MG TOTAL) BY MOUTH DAILY. Patient not taking: Reported on 07/30/2021 10/03/20 10/03/21  Dessa Phi, DO  furosemide (LASIX) 80 MG tablet TAKE 1 TABLET (80 MG TOTAL) BY MOUTH 2 (TWO) TIMES DAILY. Patient not taking: Reported on 07/30/2021 10/03/20 10/03/21  Dessa Phi, DO  lisinopril (ZESTRIL) 10 MG tablet Take 1 tablet (10 mg total) by mouth daily. Patient not taking: Reported on 07/30/2021 10/04/20   Dessa Phi, DO  lisinopril (ZESTRIL) 10 MG tablet TAKE 1 TABLET (10 MG TOTAL) BY MOUTH DAILY. Patient not taking: Reported on 07/30/2021 10/03/20 10/03/21  Dessa Phi, DO    I have reviewed the patient's current medications.  Labs:  Results for orders placed or performed during the hospital encounter of 08/14/21 (from the past 48 hour(s))  Comprehensive metabolic panel     Status: Abnormal   Collection Time: 08/14/21  9:38 AM  Result Value Ref Range   Sodium 133 (L) 135 - 145 mmol/L   Potassium 3.9 3.5 - 5.1 mmol/L   Chloride 102 98 - 111 mmol/L   CO2 21 (L) 22 - 32 mmol/L   Glucose, Bld 100 (H) 70 - 99 mg/dL    Comment: Glucose reference range applies only to samples taken after fasting for at least 8 hours.   BUN  76 (H) 6 - 20 mg/dL   Creatinine, Ser 6.41 (H) 0.61 - 1.24 mg/dL   Calcium 7.1 (L) 8.9 - 10.3 mg/dL   Total Protein 5.9 (L) 6.5 - 8.1 g/dL   Albumin <1.5 (L) 3.5 - 5.0 g/dL   AST 33 15 - 41 U/L   ALT 12 0 - 44 U/L   Alkaline Phosphatase 79 38 - 126 U/L   Total Bilirubin 0.2 (L) 0.3 - 1.2 mg/dL   GFR, Estimated 12 (L) >60 mL/min    Comment: (NOTE) Calculated using the CKD-EPI Creatinine  Equation (2021)    Anion gap 10 5 - 15    Comment: Performed at [facility], [place] [address] [place], [place] [address]  CBC with Differential     Status: Abnormal   Collection Time: 08/14/21  9:38 AM  Result Value Ref Range   WBC 25.0 (H) 4.0 - 10.5 K/uL   RBC 2.98 (L) 4.22 - 5.81 MIL/uL   Hemoglobin 8.7 (L) 13.0 - 17.0 g/dL   HCT 26.4 (L) 39.0 - 52.0 %   MCV 88.6 80.0 - 100.0 fL   MCH 29.2 26.0 - 34.0 pg   MCHC 33.0 30.0 - 36.0 g/dL   RDW 11.9 11.5 - 15.5 %   Platelets 1,082 (HH) 150 - 400 K/uL    Comment: REPEATED TO VERIFY PLATELET COUNT CONFIRMED BY SMEAR THIS CRITICAL RESULT HAS VERIFIED AND BEEN CALLED TO S. COOKE, RN BY LEAH KLAR ON 01 24 2023 AT 87, AND HAS BEEN READ BACK.     nRBC 0.0 0.0 - 0.2 %   Neutrophils Relative % 85 %   Neutro Abs 21.1 (H) 1.7 - 7.7 K/uL   Lymphocytes Relative 7 %   Lymphs Abs 1.8 0.7 - 4.0 K/uL   Monocytes Relative 7 %   Monocytes Absolute 1.8 (H) 0.1 - 1.0 K/uL   Eosinophils Relative 0 %   Eosinophils Absolute 0.0 0.0 - 0.5 K/uL   Basophils Relative 0 %   Basophils Absolute 0.1 0.0 - 0.1 K/uL   Immature Granulocytes 1 %   Abs Immature Granulocytes 0.21 (H) 0.00 - 0.07 K/uL    Comment: Performed at [facility] [address], [place], [place] [address]  Lipase, blood     Status: None   Collection Time: 08/14/21  9:38 AM  Result Value Ref Range   Lipase 45 11 - 51 U/L    Comment: Performed at [facility] [address] [place], [place] [address]  Troponin I (High Sensitivity)     Status: None   Collection Time: 08/14/21   9:38 AM  Result Value Ref Range   Troponin I (High Sensitivity) 17 <18 ng/L    Comment: (NOTE) Elevated high sensitivity troponin I (hsTnI) values and significant  changes across serial measurements may suggest ACS but many other  chronic and acute conditions are known to elevate hsTnI results.  Refer to the Links section for chest pain algorithms and additional  guidance. Performed at [facility], [place] [address], [place], [place] [address]   Magnesium     Status: Abnormal   Collection Time: 08/14/21  9:38 AM  Result Value Ref Range   Magnesium 2.7 (H) 1.7 - 2.4 mg/dL    Comment: Performed at [facility] [address], [place], [place] [address]  Resp Panel by RT-PCR (Flu Philip Trevino&B, Covid) Nasopharyngeal Swab     Status: None   Collection Time: 08/14/21  4:26 PM   Specimen: Nasopharyngeal Swab; Nasopharyngeal(NP) swabs in vial transport medium  Result Value Ref Range   SARS Coronavirus 2 by RT PCR NEGATIVE NEGATIVE    Comment: (NOTE) SARS-CoV-2 target nucleic acids are NOT DETECTED.  The SARS-CoV-2 RNA is generally detectable in upper respiratory specimens during the acute phase of infection. The lowest concentration of SARS-CoV-2 viral copies this assay Trevino detect is 138 copies/mL. Philip Trevino negative result does not preclude SARS-Cov-2 infection and should not be used as the sole basis for treatment or other patient management decisions. Philip Trevino negative result  may occur with  improper specimen collection/handling, submission of specimen other than nasopharyngeal swab, presence of viral mutation(s) within the areas targeted by this assay, and inadequate number of viral copies(<138 copies/mL). Philip Trevino negative result must be combined with clinical observations, patient history, and epidemiological information. The expected result is Negative.  Fact Sheet for Patients:  EntrepreneurPulse.com.au  Fact Sheet for Healthcare Providers:   IncredibleEmployment.be  This test is no t yet approved or cleared by the [place] FDA and  has been authorized for detection and/or diagnosis of SARS-CoV-2 by FDA under an Emergency Use Authorization (EUA). This EUA will remain  in effect (meaning this test Trevino be used) for the duration of the COVID-19 declaration under Section 564(b)(1) of the Act, 21 U.S.C.section 360bbb-3(b)(1), unless the authorization is terminated  or revoked sooner.       Influenza Philip Trevino by PCR NEGATIVE NEGATIVE   Influenza B by PCR NEGATIVE NEGATIVE    Comment: (NOTE) The Xpert Xpress SARS-CoV-2/FLU/RSV plus assay is intended as an aid in the diagnosis of influenza from Nasopharyngeal swab specimens and should not be used as Philip Trevino sole basis for treatment. Nasal washings and aspirates are unacceptable for Xpert Xpress SARS-CoV-2/FLU/RSV testing.  Fact Sheet for Patients: EntrepreneurPulse.com.au  Fact Sheet for Healthcare Providers: IncredibleEmployment.be  This test is not yet approved or cleared by the [place] FDA and has been authorized for detection and/or diagnosis of SARS-CoV-2 by FDA under an Emergency Use Authorization (EUA). This EUA will remain in effect (meaning this test Trevino be used) for the duration of the COVID-19 declaration under Section 564(b)(1) of the Act, 21 U.S.C. section 360bbb-3(b)(1), unless the authorization is terminated or revoked.  Performed at [facility], [place] [address] [place], [place] [address]   Troponin I (High Sensitivity)     Status: Abnormal   Collection Time: 08/14/21  5:36 PM  Result Value Ref Range   Troponin I (High Sensitivity) 46 (H) <18 ng/L    Comment: (NOTE) Elevated high sensitivity troponin I (hsTnI) values and significant  changes across serial measurements may suggest ACS but many other  chronic and acute conditions are known to elevate hsTnI results.  Refer to the Links section  for chest pain algorithms and additional  guidance. Performed at [facility], [place] [address], [place], [place] [address]   HIV Antibody (routine testing w rflx)     Status: None   Collection Time: 08/14/21  5:58 PM  Result Value Ref Range   HIV Screen 4th Generation wRfx Non Reactive Non Reactive    Comment: Performed at [facility], [place] [address], [place], [place] [address]     ROS:  Philip Trevino comprehensive review of systems was negative except for: Ears, nose, mouth, throat, and face: positive for loss of taste Gastrointestinal: positive for decreased appetite  Physical Exam: Vitals:   08/14/21 1825 08/14/21 2159  BP: 124/85 121/89  Pulse: 90 89  Resp: 18 14  Temp:  99 F (37.2 C)  SpO2: 100% 100%     General: alert, NAD-  not knowledgeable about his situation-  not forthcoming with details HEENT: PERRLA, EOMI- mucous membranes moist  Neck: no JVD Heart: RRR Lungs: mostly clear  Abdomen:soft, non tender Extremities: 1+ edema  Skin:warm and dry  Neuro: alert, non focal   Assessment/Plan:25 ear old BM with history of FSG-  course complicated by frequent flaring and medication non compliance-  now with Philip Trevino on CRF and likely disease flaring  1.Renal-  biopsy proven FSG with frequent flares but usually associated with medication non compliance.  In October/November crt was normal-  and 17 days ago crt was 2.2-  now 6.  Ultrasound does not show obstruction-  urine studies pending to look for protein but with albumin less than 1.5 seems nephrotic.  Big change in renal function in last 17 days possibly due to ATN from low BP and ACE or due to NSAIDS-  holding both meds and continuing to reassess function.  Is his loss of taste due to uremia ?    Patient may need an alternative agent to treat his FSG in the future unless we Trevino impress upon him the importance of treating his kidney disease-  hesitate to use progaf with an elevated crt -  ? Rituxan -  not sure if I want to give  him steroids again with his long term exposure  2. Hypertension/volume  - has 1+ edema but he seems to think that it is better-  holding diuretics for now 3.  Appetite sxms-  I guess could be due to uremia although usually 39 year olds Trevino tolerate BUNs of 70's without sxms -  or could it be due to gerd from prednisone  4. Anemia  - indicates long term CKD vs GIB possibly due to long term steroids    Philip Trevino Philip Trevino Philip Trevino 08/14/2021, 11:00 PM

## 2021-08-14 NOTE — ED Notes (Signed)
Attempted IV x 2. Mickel Baas to attempt.

## 2021-08-14 NOTE — ED Notes (Signed)
Platelet -[id number]

## 2021-08-14 NOTE — ED Triage Notes (Signed)
Pt. Stated, Philip Trevino had loss of taste, loss of appetite and chest pain for 3 weeks.

## 2021-08-14 NOTE — ED Notes (Signed)
Pt to US.

## 2021-08-14 NOTE — ED Triage Notes (Signed)
Pt was here on Jan. 9, for the same decided not to wait.

## 2021-08-14 NOTE — H&P (Addendum)
History and Physical    Philip Trevino HBZ:[id number] DOB: 01-31-1996 DOA: 08/14/2021  PCP: Kerin Perna, NP  Patient coming from: Home   Chief Complaint: loss of taste, fatigue, weakness, diarrhea  HPI: Philip Trevino is a 26 y.o. 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 Pt reports for the 2-3 weeks has noticed loss of taste, fatigue, weakness . Some pleuritic cp.Also with sob feeling of chest congestion but no cough.Had subjective fever.  Has had decrease appetite but tried to hydrate by drinking lots of water. Had multiple episodes of diarrhea , but today noticed formed stool.  Denies sick contact with anyone that may have covid. No covid vaccination. Denies alcohol or smoking. But did try marijuana to increase his appetite but could not. Denies any other drug use.  Was suppose to f/u with nephrology as outpatient but he never did. He said he was "bouncing around and had no transportation to go to his appointment. "Has not taken any of his home meds.   Denies difficulty  with urination, states urinating fine and clear yellow color. No abd pain/frank pain.   ED Course: vitals: RR 22, pulse 95, bp 117/85, Tem 99.0  98% 02 sat RA Significant labs: Covid negative. Na 133, bun 76, creatine 6.41, cal. 7.1 Mg 2.7 Albumin <1.5 Total protein 5.9, total bili 0.2, TP 46, wbc 25, Hg 8.7, Platelet 1082 Cxr personally reviewed: Bibasilar chest densities, left side greater than right. Findingsare suggestive for pleural effusions with compressive atelectasis or consolidation. Left pleural effusion could be moderate in size.  Renal BO:FBPZWCHEN echotexture throughout the kidneys. This can be seen with chronic renal disease, sickle cell disease, HIV, but also can be seen as a normal variant. No hydronephrosis.  EKG personally reviewed: NSR , no ischemic ST/T changes  ED Attending requested  consultation with the nephrology Dr. Moshe Cipro and hematology,  and discussed lab and imaging findings as well as pertinent plan - they recommend: Nephrology recommends renal ultrasound and will see him as an inpatient.  Hematology recommends trending the leukocytosis and thrombocytosis, they believe that this is most likely inflammatory/reactive to the renal failure   Review of Systems: All systems reviewed and otherwise negative.    Past Medical History:  Diagnosis Date   Nephrotic syndrome     Past Surgical History:  Procedure Laterality Date   IR FLUORO GUIDE CV LINE RIGHT  01/31/2020   IR US GUIDE VASC ACCESS RIGHT  01/31/2020   TONSILLECTOMY       reports that he has been smoking. He has never used smokeless tobacco. He reports that he does not currently use alcohol. He reports current drug use. Drug: Marijuana.  Allergies  Allergen Reactions   Broccoli [Brassica Oleracea] Hives    CAULIFLOWER (allergic, also)    Family History  Problem Relation Age of Onset   Healthy Mother    Healthy Father      Prior to Admission medications   Medication Sig Start Date End Date Taking? Authorizing Provider  aspirin 81 MG chewable tablet Chew 1 tablet (81 mg total) by mouth daily. Patient not taking: Reported on 07/30/2021 10/03/20   Dessa Phi, DO  aspirin 81 MG chewable tablet CHEW 1 TABLET (81 MG TOTAL) BY MOUTH DAILY. Patient not taking: Reported on 07/30/2021 10/03/20 10/03/21  Dessa Phi, DO  atorvastatin (LIPITOR) 80 MG tablet Take 1 tablet (80 mg total) by mouth daily. Patient not  taking: Reported on 07/30/2021 10/04/20   Dessa Phi, DO  atorvastatin (LIPITOR) 80 MG tablet TAKE 1 TABLET (80 MG TOTAL) BY MOUTH DAILY. Patient not taking: Reported on 07/30/2021 10/03/20 10/03/21  Dessa Phi, DO  ezetimibe (ZETIA) 10 MG tablet Take 1 tablet (10 mg total) by mouth daily. Patient not taking: Reported on 07/30/2021 10/04/20   Dessa Phi, DO  ezetimibe (ZETIA) 10 MG tablet TAKE  1 TABLET (10 MG TOTAL) BY MOUTH DAILY. Patient not taking: Reported on 07/30/2021 10/03/20 10/03/21  Dessa Phi, DO  furosemide (LASIX) 80 MG tablet Take 1 tablet (80 mg total) by mouth 2 (two) times daily. 10/03/20 01/01/21  Dessa Phi, DO  furosemide (LASIX) 80 MG tablet TAKE 1 TABLET (80 MG TOTAL) BY MOUTH 2 (TWO) TIMES DAILY. Patient not taking: Reported on 07/30/2021 10/03/20 10/03/21  Dessa Phi, DO  lisinopril (ZESTRIL) 10 MG tablet Take 1 tablet (10 mg total) by mouth daily. Patient not taking: Reported on 07/30/2021 10/04/20   Dessa Phi, DO  lisinopril (ZESTRIL) 10 MG tablet TAKE 1 TABLET (10 MG TOTAL) BY MOUTH DAILY. Patient not taking: Reported on 07/30/2021 10/03/20 10/03/21  Dessa Phi, DO  Pseudoeph-CPM-DM-APAP [facility] FLU/COLD/COUGH PO) Take 1 capsule by mouth daily as needed (For cold symptoms).    [provider]    Physical Exam: Vitals:   08/14/21 0909 08/14/21 1243 08/14/21 1552  BP: (!) 127/91 117/85 131/86  Pulse: 99 95 93  Resp: 16 (!) 22 16  Temp: 99 F (37.2 C)    TempSrc: Oral    SpO2: 98% 98% 100%     Vitals:   08/14/21 0909 08/14/21 1243 08/14/21 1552  BP: (!) 127/91 117/85 131/86  Pulse: 99 95 93  Resp: 16 (!) 22 16  Temp: 99 F (37.2 C)    TempSrc: Oral    SpO2: 98% 98% 100%  Constitutional: NAD, calm, comfortable Eyes: PERRL, lids and conjunctivae normal ENMT: Mucous membranes are moist. .Normal dentition.  Neck: normal, supple, no masses, no thyromegaly Respiratory: clear to auscultation bilaterally, no wheezing, no crackles. Normal respiratory effort. No accessory muscle use.  Cardiovascular: Regular rate and rhythm, no murmurs / rubs / gallops. No extremity edema. 2+ pedal pulses. No carotid bruits.  Abdomen: no tenderness, no masses palpated. No hepatosplenomegaly. Bowel sounds positive.  Musculoskeletal: no clubbing / cyanosis. No joint deformity upper and lower extremities. Good ROM, no contractures. Normal muscle tone.   Skin: warm, dry Neurologic: CN 2-12 grossly intact. Sensation intact, Strength 5/5 in all 4.  Psychiatric: Normal judgment and insight. Alert and oriented x 3. Normal mood.    Labs on Admission: I have personally reviewed following labs and imaging studies  CBC: Recent Labs  Lab 08/14/21 0938  WBC 25.0*  NEUTROABS 21.1*  HGB 8.7*  HCT 26.4*  MCV 88.6  PLT 2,637*   Basic Metabolic Panel: Recent Labs  Lab 08/14/21 0938  NA 133*  K 3.9  CL 102  CO2 21*  GLUCOSE 100*  BUN 76*  CREATININE 6.41*  CALCIUM 7.1*  MG 2.7*   GFR: CrCl cannot be calculated (Unknown ideal weight.). Liver Function Tests: Recent Labs  Lab 08/14/21 0938  AST 33  ALT 12  ALKPHOS 79  BILITOT 0.2*  PROT 5.9*  ALBUMIN <1.5*   Recent Labs  Lab 08/14/21 0938  LIPASE 45   No results for input(s): AMMONIA in the last 168 hours. Coagulation Profile: No results for input(s): INR, PROTIME in the last 168 hours. Cardiac Enzymes: No results  for input(s): CKTOTAL, CKMB, CKMBINDEX, TROPONINI in the last 168 hours. BNP (last 3 results) No results for input(s): PROBNP in the last 8760 hours. HbA1C: No results for input(s): HGBA1C in the last 72 hours. CBG: No results for input(s): GLUCAP in the last 168 hours. Lipid Profile: No results for input(s): CHOL, HDL, LDLCALC, TRIG, CHOLHDL, LDLDIRECT in the last 72 hours. Thyroid Function Tests: No results for input(s): TSH, T4TOTAL, FREET4, T3FREE, THYROIDAB in the last 72 hours. Anemia Panel: No results for input(s): VITAMINB12, FOLATE, FERRITIN, TIBC, IRON, RETICCTPCT in the last 72 hours. Urine analysis:    Component Value Date/Time   COLORURINE YELLOW 09/27/2020 1709   APPEARANCEUR HAZY (A) 09/27/2020 1709   LABSPEC 1.015 09/27/2020 1709   PHURINE 5.0 09/27/2020 1709   GLUCOSEU 50 (A) 09/27/2020 1709   HGBUR SMALL (A) 09/27/2020 1709   BILIRUBINUR NEGATIVE 09/27/2020 1709   KETONESUR NEGATIVE 09/27/2020 1709   PROTEINUR >=300 (A)  09/27/2020 1709   UROBILINOGEN 1.0 03/02/2019 1944   NITRITE NEGATIVE 09/27/2020 1709   LEUKOCYTESUR NEGATIVE 09/27/2020 1709    Radiological Exams on Admission: DG Chest 2 View  Result Date: 08/14/2021 CLINICAL DATA:  Chest pain. EXAM: CHEST - 2 VIEW COMPARISON:  09/27/2020 FINDINGS: Bibasilar chest densities, left side greater than right. Findings are suggestive for bilateral pleural effusions. Suspect compressive atelectasis but cannot exclude basilar airspace disease or consolidation. Upper lungs are clear. Heart size is incompletely evaluated due to the basilar chest densities. Negative for pneumothorax. IMPRESSION: 1. Bibasilar chest densities, left side greater than right. Findings are suggestive for pleural effusions with compressive atelectasis or consolidation. Left pleural effusion could be moderate in size. Electronically Signed   By: Markus Daft M.D.   On: 08/14/2021 10:02   US RENAL  Result Date: 08/14/2021 CLINICAL DATA:  Bloated abdomen EXAM: RENAL / URINARY TRACT ULTRASOUND COMPLETE COMPARISON:  None. FINDINGS: Right Kidney: Renal measurements: 14.6 x 5.5 x 6.6 cm = volume: 278 mL. Diffusely increased echotexture. No mass or hydronephrosis Left Kidney: Renal measurements: 14.8 x 6.0 x 5.3 cm = volume: 248 mL. Diffusely increased echotexture. No mass or hydronephrosis. Bladder: Appears normal for degree of bladder distention. Other: None. IMPRESSION: Increased echotexture throughout the kidneys. This can be seen with chronic renal disease, sickle cell disease, HIV, but also can be seen as a normal variant. No hydronephrosis. Electronically Signed   By: Rolm Baptise M.D.   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 Pleural effusion Hypoalbuminemia -2/2 nephrotic syndrome May need thoracentesis Monitor as on ivf Currently on RA  5. Leukocytosis Anemia Thrombocytosis: ED spoke to hematology , likely due to inflammatory/reactive to renal failure. If not better , can consult them officially. Anemia likely due to CKD.   6. Elevated TP Likely 2ndary to AKI EKG with no ischemic changes  7. Diarrhea Improved per pt.  Possibly viral gastroenteritis v.s. renal etiology  8.consolidation of weakness/fatigue/loss of taste Covid negative Likely due to AKI  DVT prophylaxis: heparin  Code Status: full Family Communication: none at bedside  Disposition Plan: TBD Consults called: nephrology Dr. Moshe Cipro by ED MD  Admission status: Inpatient as patient requires more than 2MN due to complexity of illness requiring hospitalization.   Time spent >80 min with >50% on coc  Nolberto Hanlon MD [facility] Pager on [place]  If 7PM-7AM, please contact night-coverage [email or web address] Password [facility]  08/14/2021, 5:56 PM

## 2021-08-15 ENCOUNTER — Other Ambulatory Visit: Payer: Self-pay

## 2021-08-15 DIAGNOSIS — J90 Pleural effusion, not elsewhere classified: Secondary | ICD-10-CM

## 2021-08-15 DIAGNOSIS — D72829 Elevated white blood cell count, unspecified: Secondary | ICD-10-CM | POA: Diagnosis present

## 2021-08-15 DIAGNOSIS — N179 Acute kidney failure, unspecified: Secondary | ICD-10-CM | POA: Diagnosis not present

## 2021-08-15 DIAGNOSIS — E785 Hyperlipidemia, unspecified: Secondary | ICD-10-CM | POA: Diagnosis present

## 2021-08-15 DIAGNOSIS — E782 Mixed hyperlipidemia: Secondary | ICD-10-CM | POA: Diagnosis present

## 2021-08-15 LAB — URINALYSIS, COMPLETE (UACMP) WITH MICROSCOPIC
Bilirubin Urine: NEGATIVE
Glucose, UA: NEGATIVE mg/dL
Ketones, ur: NEGATIVE mg/dL
Nitrite: NEGATIVE
Protein, ur: >=300 mg/dL — AB
Specific Gravity, Urine: 1.017 (ref 1.005–1.030)
WBC, UA: >50 WBC/hpf — ABNORMAL HIGH (ref 0–5)
pH: 5 (ref 5.0–8.0)

## 2021-08-15 LAB — BASIC METABOLIC PANEL
Anion gap: 7 (ref 5–15)
BUN: 77 mg/dL — ABNORMAL HIGH (ref 6–20)
CO2: 20 mmol/L — ABNORMAL LOW (ref 22–32)
Calcium: 6.7 mg/dL — ABNORMAL LOW (ref 8.9–10.3)
Chloride: 104 mmol/L (ref 98–111)
Creatinine, Ser: 6.08 mg/dL — ABNORMAL HIGH (ref 0.61–1.24)
GFR, Estimated: 12 mL/min — ABNORMAL LOW (ref >60)
Glucose, Bld: 111 mg/dL — ABNORMAL HIGH (ref 70–99)
Potassium: 3.8 mmol/L (ref 3.5–5.1)
Sodium: 131 mmol/L — ABNORMAL LOW (ref 135–145)

## 2021-08-15 LAB — RAPID URINE DRUG SCREEN, HOSP PERFORMED
Amphetamines: NOT DETECTED
Barbiturates: NOT DETECTED
Benzodiazepines: NOT DETECTED
Cocaine: NOT DETECTED
Opiates: NOT DETECTED
Tetrahydrocannabinol: POSITIVE — AB

## 2021-08-15 LAB — CBC
HCT: 27.9 % — ABNORMAL LOW (ref 39.0–52.0)
Hemoglobin: 9 g/dL — ABNORMAL LOW (ref 13.0–17.0)
MCH: 28.7 pg (ref 26.0–34.0)
MCHC: 32.3 g/dL (ref 30.0–36.0)
MCV: 88.9 fL (ref 80.0–100.0)
Platelets: 1030 10*3/uL (ref 150–400)
RBC: 3.14 MIL/uL — ABNORMAL LOW (ref 4.22–5.81)
RDW: 12.1 % (ref 11.5–15.5)
WBC: 26.7 10*3/uL — ABNORMAL HIGH (ref 4.0–10.5)
nRBC: 0 % (ref 0.0–0.2)

## 2021-08-15 LAB — LIPID PANEL
Cholesterol: 155 mg/dL (ref 0–200)
HDL: 18 mg/dL — ABNORMAL LOW (ref >40)
LDL Cholesterol: 113 mg/dL — ABNORMAL HIGH (ref 0–99)
Total CHOL/HDL Ratio: 8.6 RATIO
Triglycerides: 121 mg/dL (ref <150)
VLDL: 24 mg/dL (ref 0–40)

## 2021-08-15 LAB — PATHOLOGIST SMEAR REVIEW

## 2021-08-15 MED ORDER — PANTOPRAZOLE SODIUM 40 MG PO TBEC
40.0000 mg | DELAYED_RELEASE_TABLET | Freq: Every day | ORAL | Status: DC
  Administered 2021-08-15 – 2021-08-25 (×10): 40 mg via ORAL
  Filled 2021-08-15 (×10): qty 1

## 2021-08-15 MED ORDER — HYDROCODONE-ACETAMINOPHEN 7.5-325 MG PO TABS
1.0000 | ORAL_TABLET | Freq: Four times a day (QID) | ORAL | Status: DC | PRN
  Administered 2021-08-15 – 2021-08-16 (×3): 2 via ORAL
  Administered 2021-08-16: 1 via ORAL
  Administered 2021-08-17 – 2021-08-19 (×7): 2 via ORAL
  Filled 2021-08-15 (×12): qty 2

## 2021-08-15 NOTE — ED Notes (Signed)
Sent secure Dr Benny Lennert re: platelets [phone]

## 2021-08-15 NOTE — Progress Notes (Signed)
PROGRESS NOTE  Philip Trevino JYN:[id number] DOB: [birth year] DOA: 08/14/2021 PCP: Philip Perna, NP  Brief History   Philip Trevino is a 26 y.o. 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 Pt reports for the 2-3 weeks has noticed loss of taste, fatigue, weakness . Some pleuritic cp.Also with sob feeling of chest congestion but no cough.Had subjective fever.  Has had decrease appetite but tried to hydrate by drinking lots of water. Had multiple episodes of diarrhea , but today noticed formed stool.  Denies sick contact with anyone that may have covid. No covid vaccination. Denies alcohol or smoking. But did try marijuana to increase his appetite but could not. Denies any other drug use.  Was suppose to f/u with nephrology as outpatient but he never did. He said he was "bouncing around and had no transportation to go to his appointment. "Has not taken any of his home meds.   Denies difficulty  with urination, states urinating fine and clear yellow color. No abd pain/frank pain.    ED Course: vitals: RR 22, pulse 95, bp 117/85, Tem 99.0  98% 02 sat RA Significant labs: Covid negative. Na 133, bun 76, creatine 6.41, cal. 7.1 Mg 2.7 Albumin <1.5 Total protein 5.9, total bili 0.2, TP 46, wbc 25, Hg 8.7, Platelet 1082 Cxr personally reviewed: Bibasilar chest densities, left side greater than right. Findingsare suggestive for pleural effusions with compressive atelectasis or consolidation. Left pleural effusion could be moderate in size.   Renal QM:VHQIONGEX echotexture throughout the kidneys. This can be seen with chronic renal disease, sickle cell disease, HIV, but also can be seen as a normal variant. No hydronephrosis.   EKG personally reviewed: NSR , no ischemic ST/T changes   ED Attending requested consultation with the nephrology Dr. Moshe Trevino and hematology,  and discussed lab and imaging  findings as well as pertinent plan - they recommend: Nephrology recommends renal ultrasound and will see him as an inpatient.  Hematology recommends trending the leukocytosis and thrombocytosis, they believe that this is most likely inflammatory/reactive to the renal failure. They are considering alternative agents for treating his FSG. However will need to be improvement in creatinine before this can be undertaken. Diuretics have been held and the patient has been placed on protonix for his anemia.  CXR had demonstrated b/l pleural effusions left greater than right. Left may be moderate in size. The patient complains of shortness of breath with turning on either side. Diuretics held off for now due to renal insufficiency.   Patient is complaining of low back pain as well. Hydrocodone ordered.  Consultants  Nephrology  Procedures    Antibiotics   Anti-infectives (From admission, onward)    None      Subjective  Patient is awake, alert, and oriented x 3. No acute distress.  Objective   Vitals:  Vitals:   08/15/21 0930 08/15/21 1031  BP: 130/79 126/85  Pulse: 85 91  Resp: 16 18  Temp: 99.3 F (37.4 C) 99.1 F (37.3 C)  SpO2: 99% 100%    Exam:  Constitutional:  Appears calm and comfortable Eyes:  pupils and irises appear normal Normal lids and conjunctivae ENMT:  grossly normal hearing  Lips appear normal external ears, nose appear normal Oropharynx: mucosa, tongue,posterior pharynx appear normal Neck:  neck appears normal, no masses, normal ROM, supple no thyromegaly Respiratory:  Diminished lung sounds at bases bilaterally. No wheezes, rales, or rhonchi  are appreciated. Respiratory effort normal. No retractions or accessory muscle use Cardiovascular:  RRR, no m/r/g No LE extremity edema   Normal pedal pulses Abdomen:  Abdomen appears normal; no tenderness or masses No hernias No HSM Musculoskeletal:  Digits/nails BUE: no clubbing, cyanosis, petechiae,  infection 1-2 + pitting edema of lower extremities bilaterally. exam of joints, bones, muscles of at least one of following: head/neck, RUE, LUE, RLE, LLE   strength and tone normal, no atrophy, no abnormal movements No tenderness, masses Normal ROM, no contractures  gait and station Skin:  No rashes, lesions, ulcers palpation of skin: no induration or nodules Neurologic:  CN 2-12 intact Sensation all 4 extremities intact Psychiatric:  Mental status Mood, affect appropriate Orientation to person, place, time  judgment and insight appear intact     I have personally reviewed the following:   Today's Data  Vitals  Lab Data  BMP Lipid panel CBC  Micro Data  Blood culture x 2: No growth  Imaging  CXR  Cardiology Data  EKG  Other Data    Scheduled Meds:  atorvastatin  80 mg Oral Daily   ezetimibe  10 mg Oral Daily   heparin  5,000 Units Subcutaneous Q8H   pantoprazole  40 mg Oral Daily   Continuous Infusions:    LOS: 1 day  Problem  Leukocytosis  Hyperlipidemia  Pleural Effusion  Anemia, Unspecified  Benign Essential Htn  Acute Kidney injury   A & P  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  Likely AKI possibly due to prerenal +/-Progression of dz Nephrology consulted Start IVF for hydration Hold nephrotoxic meds, diuretics, ACEI Ck UA CK drug screen   2.DL-  Was on statin and zetia, but not taking Will resume  Ck FLP   3.HTN Was on lasix and ACEI Will hold both  Currently bp stable, will monitor   4. Pleural effusions bilaterally. Hypoalbuminemia -2/2 nephrotic syndrome May need thoracentesis Monitor as on ivf Currently on RA   5. Leukocytosis Anemia Thrombocytosis: ED spoke to hematology , likely due to inflammatory/reactive to renal failure. If not better , can consult them officially. Anemia likely due to CKD.  Leukocytosis possibly due to steroids.   6.  Elevated TP Likely 2ndary to AKI EKG with no ischemic changes   7. Diarrhea Improved per pt.  Possibly viral gastroenteritis v.s. renal etiology   8.Failure to thrive Patient complains of weakness/fatigue/loss of taste Covid negative Likely due to AKI Nutrition consult  I have seen and examined this patient myself. I have spent 35 minutes in his evaluation and care.   DVT prophylaxis: heparin Code Status: full Family Communication: none at bedside  Disposition Plan: TBD  Dax Murguia, DO [facility] Direct contact: see [email or web address]  7PM-7AM contact night coverage as above 08/15/2021, 5:16 PM  LOS: 1 day

## 2021-08-15 NOTE — Progress Notes (Signed)
Subjective:  Finally got a room-   crt down some this AM-  BUN still high-  urine not recorded -  U/A with much protein on dip but not quantified -  has been getting lots of fluid -   currently at 125 per hour.  He says maybe he feels a little better   Objective Vital signs in last 24 hours: Vitals:   08/15/21 0259 08/15/21 0645 08/15/21 0930 08/15/21 1031  BP: 129/77 124/87 130/79 126/85  Pulse: 100 88 85 91  Resp: 18 17 16 18   Temp: (!) 101 F (38.3 C) 98.4 F (36.9 C) 99.3 F (37.4 C) 99.1 F (37.3 C)  TempSrc: Oral Oral Oral Oral  SpO2: 100% 100% 99% 100%   Weight change:  No intake or output data in the 24 hours ending 08/15/21 1232   Assessment/Plan: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  In October/November crt was normal-  and 17 days ago crt was 2.2-  now over 6.  Ultrasound does not show obstruction-  urine studies appear nephrotic but with microscopic hematuria as well.  Big change in renal function in last 17 days would be unusual to see because of FSG.  possibly due to ATN from low BP and ACE or due to NSAIDS-  holding both meds and continuing to reassess function.    Patient may need an alternative agent to treat his FSG in the future clearly is failing this current situation-  hesitate to use progaf with an elevated crt -  ? Rituxan -  not sure if I want to give him steroids again with his long term exposure.  He sounds excited about the prospect of using something different -  I would like to see crt better before I do this however if that will happen. Difficult to tell how much of AKI is due to kidney disease as opposed to something else  2. Hypertension/volume  - has 1+ edema but he seems to think that it is better-  holding diuretics for now BUT also does not  seem to need fluid-  will stop it 3.  Appetite sxms-  I guess could be due to uremia although usually 50 year olds can tolerate BUNs of 70's without sxms -  or could it be due to gerd from prednisone -  he says is better  4. Anemia  - indicates long term CKD vs GIB possibly due to long term steroids -  have put him on protonix      [facility]    Labs: Basic Metabolic Panel: Recent Labs  Lab 08/14/21 0938 08/15/21 0925  NA 133* 131*  K 3.9 3.8  CL 102 104  CO2 21* 20*  GLUCOSE 100* 111*  BUN 76* 77*  CREATININE 6.41* 6.08*  CALCIUM 7.1* 6.7*   Liver Function Tests: Recent Labs  Lab 08/14/21 0938  AST 33  ALT 12  ALKPHOS 79  BILITOT 0.2*  PROT 5.9*  ALBUMIN <1.5*   Recent Labs  Lab 08/14/21 0938  LIPASE 45   No results for input(s): AMMONIA in the last 168 hours. CBC: Recent Labs  Lab 08/14/21 0938 08/15/21 0925  WBC 25.0* 26.7*  NEUTROABS 21.1*  --   HGB 8.7* 9.0*  HCT 26.4* 27.9*  MCV 88.6 88.9  PLT 1,082* 1,030*   Cardiac Enzymes: No results for input(s): CKTOTAL, CKMB, CKMBINDEX, TROPONINI in the last 168 hours. CBG: No results for input(s): GLUCAP in the last 168 hours.  Iron Studies: No results for input(s): IRON, TIBC, TRANSFERRIN, FERRITIN in the last 72 hours. Studies/Results: DG Chest 2 View  Result Date: 08/14/2021 CLINICAL DATA:  Chest pain. EXAM: CHEST - 2 VIEW COMPARISON:  09/27/2020 FINDINGS: Bibasilar chest densities, left side greater than right. Findings are suggestive for bilateral pleural effusions. Suspect compressive atelectasis but cannot exclude basilar airspace disease or consolidation. Upper lungs are clear. Heart size is incompletely evaluated due to the basilar chest densities. Negative for pneumothorax. IMPRESSION: 1. Bibasilar chest densities, left side greater than right. Findings are suggestive for pleural effusions with compressive atelectasis or consolidation. Left pleural effusion could be moderate in size.  Electronically Signed   By: Markus Daft M.D.   On: 08/14/2021 10:02   US RENAL  Result Date: 08/14/2021 CLINICAL DATA:  Bloated abdomen EXAM: RENAL / URINARY TRACT ULTRASOUND COMPLETE COMPARISON:  None. FINDINGS: Right Kidney: Renal measurements: 14.6 x 5.5 x 6.6 cm = volume: 278 mL. Diffusely increased echotexture. No mass or hydronephrosis Left Kidney: Renal measurements: 14.8 x 6.0 x 5.3 cm = volume: 248 mL. Diffusely increased echotexture. No mass or hydronephrosis. Bladder: Appears normal for degree of bladder distention. Other: None. IMPRESSION: Increased echotexture throughout the kidneys. This can be seen with chronic renal disease, sickle cell disease, HIV, but also can be seen as a normal variant. No hydronephrosis. Electronically Signed   By: Rolm Baptise M.D.   On: 08/14/2021 17:45   Medications: Infusions:  lactated ringers 125 mL/hr at 08/15/21 3491    Scheduled Medications:  atorvastatin  80 mg Oral Daily   ezetimibe  10 mg Oral Daily   heparin  5,000 Units Subcutaneous Q8H    have reviewed scheduled and prn medications.  Physical Exam: General: flat affect-  says maybe better Heart: RRR Lungs: mostly clear Abdomen: soft, non tender Extremities: pitting edema     08/15/2021,12:32 PM  LOS: 1 day

## 2021-08-15 NOTE — Plan of Care (Signed)

## 2021-08-16 DIAGNOSIS — N179 Acute kidney failure, unspecified: Secondary | ICD-10-CM | POA: Diagnosis not present

## 2021-08-16 LAB — RENAL FUNCTION PANEL
Albumin: <1.5 g/dL — ABNORMAL LOW (ref 3.5–5.0)
Anion gap: 17 — ABNORMAL HIGH (ref 5–15)
BUN: 76 mg/dL — ABNORMAL HIGH (ref 6–20)
CO2: 20 mmol/L — ABNORMAL LOW (ref 22–32)
Calcium: 7.7 mg/dL — ABNORMAL LOW (ref 8.9–10.3)
Chloride: 100 mmol/L (ref 98–111)
Creatinine, Ser: 6.36 mg/dL — ABNORMAL HIGH (ref 0.61–1.24)
GFR, Estimated: 12 mL/min — ABNORMAL LOW (ref >60)
Glucose, Bld: 106 mg/dL — ABNORMAL HIGH (ref 70–99)
Phosphorus: 8.2 mg/dL — ABNORMAL HIGH (ref 2.5–4.6)
Potassium: 4.4 mmol/L (ref 3.5–5.1)
Sodium: 137 mmol/L (ref 135–145)

## 2021-08-16 LAB — CBC WITH DIFFERENTIAL/PLATELET
Abs Immature Granulocytes: 0.22 10*3/uL — ABNORMAL HIGH (ref 0.00–0.07)
Basophils Absolute: 0.1 10*3/uL (ref 0.0–0.1)
Basophils Relative: 0 %
Eosinophils Absolute: 0 10*3/uL (ref 0.0–0.5)
Eosinophils Relative: 0 %
HCT: 26.4 % — ABNORMAL LOW (ref 39.0–52.0)
Hemoglobin: 8.7 g/dL — ABNORMAL LOW (ref 13.0–17.0)
Immature Granulocytes: 1 %
Lymphocytes Relative: 6 %
Lymphs Abs: 1.6 10*3/uL (ref 0.7–4.0)
MCH: 28.8 pg (ref 26.0–34.0)
MCHC: 33 g/dL (ref 30.0–36.0)
MCV: 87.4 fL (ref 80.0–100.0)
Monocytes Absolute: 2 10*3/uL — ABNORMAL HIGH (ref 0.1–1.0)
Monocytes Relative: 8 %
Neutro Abs: 21 10*3/uL — ABNORMAL HIGH (ref 1.7–7.7)
Neutrophils Relative %: 85 %
Platelets: 908 10*3/uL (ref 150–400)
RBC: 3.02 MIL/uL — ABNORMAL LOW (ref 4.22–5.81)
RDW: 12.1 % (ref 11.5–15.5)
WBC: 24.8 10*3/uL — ABNORMAL HIGH (ref 4.0–10.5)
nRBC: 0 % (ref 0.0–0.2)

## 2021-08-16 LAB — PROTEIN / CREATININE RATIO, URINE
Creatinine, Urine: 290.12 mg/dL
Protein Creatinine Ratio: 3.3 mg/mg{Cre} — ABNORMAL HIGH (ref 0.00–0.15)
Total Protein, Urine: 958 mg/dL

## 2021-08-16 MED ORDER — FUROSEMIDE 10 MG/ML IJ SOLN
80.0000 mg | Freq: Two times a day (BID) | INTRAMUSCULAR | Status: DC
  Administered 2021-08-16 – 2021-08-17 (×4): 80 mg via INTRAVENOUS
  Filled 2021-08-16 (×4): qty 8

## 2021-08-16 MED ORDER — DARBEPOETIN ALFA 100 MCG/0.5ML IJ SOSY
100.0000 ug | PREFILLED_SYRINGE | INTRAMUSCULAR | Status: DC
  Administered 2021-08-16 – 2021-08-23 (×2): 100 ug via SUBCUTANEOUS
  Filled 2021-08-16 (×2): qty 0.5

## 2021-08-16 MED ORDER — SODIUM CHLORIDE 0.9 % IV SOLN
1.0000 g | INTRAVENOUS | Status: DC
  Administered 2021-08-16 – 2021-08-17 (×2): 1 g via INTRAVENOUS
  Filled 2021-08-16: qty 10

## 2021-08-16 NOTE — Progress Notes (Signed)
Subjective:  only 200 of UOP recorded -  crt is NOT improving-  curious-  Urine looks like UTI and WBC is still up    -  temp of 100  he is not feeling much better   Objective Vital signs in last 24 hours: Vitals:   08/15/21 1448 08/15/21 2103 08/16/21 0430 08/16/21 0837  BP: 124/80 123/78 119/81 127/87  Pulse: 95 94 89 100  Resp: 17 18 17 19   Temp: 99.1 F (37.3 C) 99.3 F (37.4 C) 98.3 F (36.8 C) 100 F (37.8 C)  TempSrc:  Oral Oral Oral  SpO2: 100% 99% 99% 100%  Weight:      Height:       Weight change:   Intake/Output Summary (Last 24 hours) at 08/16/2021 1009 Last data filed at 08/16/2021 0900 Gross per 24 hour  Intake 3612.31 ml  Output 350 ml  Net 3262.31 ml     Assessment/Plan: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  In October/November crt was normal-  and 17 days ago crt was 2.2-  now over 6.  Ultrasound does not show obstruction-  urine studies appear nephrotic but with microscopic hematuria as well.  Big change in renal function in last 17 days would be unusual to see because of FSG.  possibly due to ATN from low BP and ACE or due to NSAIDS-  holding both meds and continuing to reassess function-  it has not improved as of yet unfortunately -  could be due to UTI-  will give abx    Patient may need an alternative agent to treat his FSG in the future clearly is failing this current situation-  hesitate to use progaf with an elevated crt -  ? Rituxan -  not sure if I want to give him steroids again with his long term exposure.  He sounds excited about the prospect of using something different -  I would like to see crt better before I do this however if that will happen. Difficult to tell how much of AKI is due to kidney disease as opposed to something else.   Want WBC to be better or at least get a couple of days of abx on board before I start the rituxan   2. Hypertension/volume  - has 1+ edema but he seems to think that it is better-  volume status worsening with IVF in past and crt no better-  will add on diuretics -  alb low so is third spacing  3.  Appetite sxms-  I guess could be due to uremia although usually 39 year olds can tolerate BUNs of 70's without sxms -  or could it be due to gerd from prednisone -  he says is better  4. Anemia  - indicates long term CKD vs GIB possibly due to long term steroids -  have put him on protonix -  and give dose of ESA  5.  Elevated WBC-  fever and pyuria-  have discussed with hospitalist-  will empirically treat for UTI     Philip Trevino    Labs: Basic Metabolic Panel: Recent Labs  Lab 08/14/21 0938 08/15/21 0925 08/16/21 0256  NA 133* 131* 137  K 3.9 3.8 4.4  CL 102 104 100  CO2 21* 20*  20*  GLUCOSE 100* 111* 106*  BUN 76* 77* 76*  CREATININE 6.41* 6.08* 6.36*  CALCIUM 7.1* 6.7* 7.7*  PHOS  --   --  8.2*   Liver Function Tests: Recent Labs  Lab 08/14/21 0938 08/16/21 0256  AST 33  --   ALT 12  --   ALKPHOS 79  --   BILITOT 0.2*  --   PROT 5.9*  --   ALBUMIN <1.5* <1.5*   Recent Labs  Lab 08/14/21 0938  LIPASE 45   No results for input(s): AMMONIA in the last 168 hours. CBC: Recent Labs  Lab 08/14/21 0938 08/15/21 0925 08/16/21 0256  WBC 25.0* 26.7* 24.8*  NEUTROABS 21.1*  --  21.0*  HGB 8.7* 9.0* 8.7*  HCT 26.4* 27.9* 26.4*  MCV 88.6 88.9 87.4  PLT 1,082* 1,030* 908*   Cardiac Enzymes: No results for input(s): CKTOTAL, CKMB, CKMBINDEX, TROPONINI in the last 168 hours. CBG: No results for input(s): GLUCAP in the last 168 hours.  Iron Studies: No results for input(s): IRON, TIBC, TRANSFERRIN, FERRITIN in the last 72 hours. Studies/Results: US RENAL  Result Date: 08/14/2021 CLINICAL DATA:  Bloated abdomen EXAM: RENAL / URINARY TRACT ULTRASOUND COMPLETE  COMPARISON:  None. FINDINGS: Right Kidney: Renal measurements: 14.6 x 5.5 x 6.6 cm = volume: 278 mL. Diffusely increased echotexture. No mass or hydronephrosis Left Kidney: Renal measurements: 14.8 x 6.0 x 5.3 cm = volume: 248 mL. Diffusely increased echotexture. No mass or hydronephrosis. Bladder: Appears normal for degree of bladder distention. Other: None. IMPRESSION: Increased echotexture throughout the kidneys. This can be seen with chronic renal disease, sickle cell disease, HIV, but also can be seen as a normal variant. No hydronephrosis. Electronically Signed   By: Rolm Baptise M.D.   On: 08/14/2021 17:45   Medications: Infusions:    Scheduled Medications:  atorvastatin  80 mg Oral Daily   ezetimibe  10 mg Oral Daily   heparin  5,000 Units Subcutaneous Q8H   pantoprazole  40 mg Oral Daily    have reviewed scheduled and prn medications.  Physical Exam: General: flat affect-  says maybe better Heart: RRR Lungs: mostly clear Abdomen: soft, non tender Extremities: pitting edema     08/16/2021,10:09 AM  LOS: 2 days

## 2021-08-16 NOTE — Progress Notes (Signed)
Patient given incentive spirometer and educated on how to use. Patient verbalized and demonstrated understanding. SCD's placed and offered to ambulate with patient. He deferred walking out of room but will attempt again. RN will continue to monitor.

## 2021-08-16 NOTE — Progress Notes (Signed)
PROGRESS NOTE    Philip Trevino  TFT:[id number] DOB: Jan 02, 1996 DOA: 08/14/2021 PCP: Kerin Perna, NP   Brief Narrative: 26 year old past medical history significant for focal segmental glomerulosclerosis, minimal-change disease, hypertension presents with multiple complaints.  Over the last 2 or 3 weeks he noticed loss of taste, fatigue, weakness.  Pleuritic chest pain.  Chest congestion.  Subjective fever.  Multiple episodes of diarrhea.  He was supposed to follow-up with nephrology, but due to transportation issue never follow-up.  Not taking any home medications.  Evaluation in the ED: COVID-negative, BUN 76, creatinine 6.4 albumin less than 1.5, total protein 5.9.  Chest x-ray with bibasilar chest density left greater than the right.  Finding suggestive of pleural effusion compressive atelectasis.     Assessment & Plan:   Principal Problem:   AKI (acute kidney injury) ([place]) Active Problems:   Anemia, unspecified   Benign essential HTN   Leukocytosis   Hyperlipidemia   Pleural effusion   1-AKI in the setting of FSG: -Renal biopsy-proven FSGS with frequent flares but usually associated with medication noncompliance -Normal creatinine October November, 2 weeks ago creatinine 2.2 presenting with a creatinine over 6. -AKI also could be related to ATN from low blood pressure, ace.  Also treated for UTI. -Appreciate nephrology assistance. -Nephrology considering further alternative agent to treat  FSG. Considering Rituxan.  -Cr: 6.0--6.3 -Started on lasix.   Hyperlipidemia:  Continue with Zetia and Lipitor  Hypertension: started on Lasix. Hold lisinopril.   Bilateral pleural effusion Hypoalbuminemia -Suspect related to hypoalbuminemia, in setting of FSG. -Nephrology starting Lasix IV -Might need thoracentesis if leukocytosis does not improve and or if you require oxygen -Incentive spirometry.   Leukocytosis.  Anemia thrombocytosis: UA: More than 50 white blood  cell Check urine culture Started Ceftriaxone.  Peripheral Smear: Leukocytosis and thrombocytosis.  Normocytic anemia. Started on Aranesp.   Elevation of troponin: Likely demand in the setting of renal failure  Diarrhea:  Reported 2 episode of loose stool since yesterday Monitor,   Failure to thrive: Likely in the setting of AKI HIV nonreactive     Estimated body mass index is 24.93 kg/m as calculated from the following:   Height as of this encounter: 5\' 10"  (1.778 m).   Weight as of this encounter: 78.8 kg.   DVT prophylaxis: SCDs, Heparin  Code Status: Full code Family Communication: Care discussed with patient Disposition Plan:  Status is: Inpatient  Remains inpatient appropriate because: Management for acute renal failure        Consultants:  Nephrology  Procedures:    Antimicrobials:    Subjective: He reports two episodes of loose stool, denies abdominal pain, cough.   Objective: Vitals:   08/15/21 1400 08/15/21 1448 08/15/21 2103 08/16/21 0430  BP:  124/80 123/78 119/81  Pulse:  95 94 89  Resp:  17 18 17   Temp:  99.1 F (37.3 C) 99.3 F (37.4 C) 98.3 F (36.8 C)  TempSrc:   Oral Oral  SpO2:  100% 99% 99%  Weight: 78.8 kg     Height: 5\' 10"  (1.778 m)       Intake/Output Summary (Last 24 hours) at 08/16/2021 0804 Last data filed at 08/16/2021 0200 Gross per 24 hour  Intake 3492.31 ml  Output 200 ml  Net 3292.31 ml   Filed Weights   08/15/21 1400  Weight: 78.8 kg    Examination:  General exam: Appears calm and comfortable  Respiratory system: BL crackles.  Cardiovascular system: S1 & S2 heard, RRR.  Gastrointestinal system: Abdomen is nondistended, soft and nontender. No organomegaly or masses felt. Normal bowel sounds heard. Central nervous system: Alert and oriented. No focal neurological deficits. Extremities: Symmetric 5 x 5 power.   Data Reviewed: I have personally reviewed following labs and imaging studies  CBC: Recent  Labs  Lab 08/14/21 0938 08/15/21 0925 08/16/21 0256  WBC 25.0* 26.7* 24.8*  NEUTROABS 21.1*  --  21.0*  HGB 8.7* 9.0* 8.7*  HCT 26.4* 27.9* 26.4*  MCV 88.6 88.9 87.4  PLT 1,082* 1,030* 301*   Basic Metabolic Panel: Recent Labs  Lab 08/14/21 0938 08/15/21 0925 08/16/21 0256  NA 133* 131* 137  K 3.9 3.8 4.4  CL 102 104 100  CO2 21* 20* 20*  GLUCOSE 100* 111* 106*  BUN 76* 77* 76*  CREATININE 6.41* 6.08* 6.36*  CALCIUM 7.1* 6.7* 7.7*  MG 2.7*  --   --   PHOS  --   --  8.2*   GFR: Estimated Creatinine Clearance: 18.5 mL/min (A) (by C-G formula based on SCr of 6.36 mg/dL (H)). Liver Function Tests: Recent Labs  Lab 08/14/21 0938 08/16/21 0256  AST 33  --   ALT 12  --   ALKPHOS 79  --   BILITOT 0.2*  --   PROT 5.9*  --   ALBUMIN <1.5* <1.5*   Recent Labs  Lab 08/14/21 0938  LIPASE 45   No results for input(s): AMMONIA in the last 168 hours. Coagulation Profile: No results for input(s): INR, PROTIME in the last 168 hours. Cardiac Enzymes: No results for input(s): CKTOTAL, CKMB, CKMBINDEX, TROPONINI in the last 168 hours. BNP (last 3 results) No results for input(s): PROBNP in the last 8760 hours. HbA1C: No results for input(s): HGBA1C in the last 72 hours. CBG: No results for input(s): GLUCAP in the last 168 hours. Lipid Profile: Recent Labs    08/15/21 0925  CHOL 155  HDL 18*  LDLCALC 113*  TRIG 121  CHOLHDL 8.6   Thyroid Function Tests: No results for input(s): TSH, T4TOTAL, FREET4, T3FREE, THYROIDAB in the last 72 hours. Anemia Panel: No results for input(s): VITAMINB12, FOLATE, FERRITIN, TIBC, IRON, RETICCTPCT in the last 72 hours. Sepsis Labs: No results for input(s): PROCALCITON, LATICACIDVEN in the last 168 hours.  Recent Results (from the past 240 hour(s))  Resp Panel by RT-PCR (Flu A&B, Covid) Nasopharyngeal Swab     Status: None   Collection Time: 08/14/21  4:26 PM   Specimen: Nasopharyngeal Swab; Nasopharyngeal(NP) swabs in vial  transport medium  Result Value Ref Range Status   SARS Coronavirus 2 by RT PCR NEGATIVE NEGATIVE Final    Comment: (NOTE) SARS-CoV-2 target nucleic acids are NOT DETECTED.  The SARS-CoV-2 RNA is generally detectable in upper respiratory specimens during the acute phase of infection. The lowest concentration of SARS-CoV-2 viral copies this assay can detect is 138 copies/mL. A negative result does not preclude SARS-Cov-2 infection and should not be used as the sole basis for treatment or other patient management decisions. A negative result may occur with  improper specimen collection/handling, submission of specimen other than nasopharyngeal swab, presence of viral mutation(s) within the areas targeted by this assay, and inadequate number of viral copies(<138 copies/mL). A negative result must be combined with clinical observations, patient history, and epidemiological information. The expected result is Negative.  Fact Sheet for Patients:  EntrepreneurPulse.com.au  Fact Sheet for Healthcare Providers:  IncredibleEmployment.be  This test is no t yet approved or cleared by the [place] FDA  and  has been authorized for detection and/or diagnosis of SARS-CoV-2 by FDA under an Emergency Use Authorization (EUA). This EUA will remain  in effect (meaning this test can be used) for the duration of the COVID-19 declaration under Section 564(b)(1) of the Act, 21 U.S.C.section 360bbb-3(b)(1), unless the authorization is terminated  or revoked sooner.       Influenza A by PCR NEGATIVE NEGATIVE Final   Influenza B by PCR NEGATIVE NEGATIVE Final    Comment: (NOTE) The Xpert Xpress SARS-CoV-2/FLU/RSV plus assay is intended as an aid in the diagnosis of influenza from Nasopharyngeal swab specimens and should not be used as a sole basis for treatment. Nasal washings and aspirates are unacceptable for Xpert Xpress SARS-CoV-2/FLU/RSV testing.  Fact  Sheet for Patients: EntrepreneurPulse.com.au  Fact Sheet for Healthcare Providers: IncredibleEmployment.be  This test is not yet approved or cleared by the [place] FDA and has been authorized for detection and/or diagnosis of SARS-CoV-2 by FDA under an Emergency Use Authorization (EUA). This EUA will remain in effect (meaning this test can be used) for the duration of the COVID-19 declaration under Section 564(b)(1) of the Act, 21 U.S.C. section 360bbb-3(b)(1), unless the authorization is terminated or revoked.  Performed at [facility], [place] [address] [place], [place] [address]   Culture, blood (routine x 2)     Status: None (Preliminary result)   Collection Time: 08/14/21  9:32 PM   Specimen: BLOOD RIGHT HAND  Result Value Ref Range Status   Specimen Description BLOOD RIGHT HAND  Final   Special Requests   Final    BOTTLES DRAWN AEROBIC AND ANAEROBIC Blood Culture results may not be optimal due to an inadequate volume of blood received in culture bottles   Culture   Final    NO GROWTH 2 DAYS Performed at [facility], [place] [address], [place], [place] [address]    Report Status PENDING  Incomplete  Culture, blood (routine x 2)     Status: None (Preliminary result)   Collection Time: 08/14/21  9:33 PM   Specimen: BLOOD  Result Value Ref Range Status   Specimen Description BLOOD LEFT ANTECUBITAL  Final   Special Requests   Final    BOTTLES DRAWN AEROBIC AND ANAEROBIC Blood Culture adequate volume   Culture   Final    NO GROWTH 2 DAYS Performed at [facility], [place] [address] [place],  [address]    Report Status PENDING  Incomplete         Radiology Studies: DG Chest 2 View  Result Date: 08/14/2021 CLINICAL DATA:  Chest pain. EXAM: CHEST - 2 VIEW COMPARISON:  09/27/2020 FINDINGS: Bibasilar chest densities, left side greater than right. Findings are suggestive for bilateral pleural effusions. Suspect  compressive atelectasis but cannot exclude basilar airspace disease or consolidation. Upper lungs are clear. Heart size is incompletely evaluated due to the basilar chest densities. Negative for pneumothorax. IMPRESSION: 1. Bibasilar chest densities, left side greater than right. Findings are suggestive for pleural effusions with compressive atelectasis or consolidation. Left pleural effusion could be moderate in size. Electronically Signed   By: Markus Daft M.D.   On: 08/14/2021 10:02   US RENAL  Result Date: 08/14/2021 CLINICAL DATA:  Bloated abdomen EXAM: RENAL / URINARY TRACT ULTRASOUND COMPLETE COMPARISON:  None. FINDINGS: Right Kidney: Renal measurements: 14.6 x 5.5 x 6.6 cm = volume: 278 mL. Diffusely increased echotexture. No mass or hydronephrosis Left Kidney: Renal measurements: 14.8 x 6.0 x  5.3 cm = volume: 248 mL. Diffusely increased echotexture. No mass or hydronephrosis. Bladder: Appears normal for degree of bladder distention. Other: None. IMPRESSION: Increased echotexture throughout the kidneys. This can be seen with chronic renal disease, sickle cell disease, HIV, but also can be seen as a normal variant. No hydronephrosis. Electronically Signed   By: Rolm Baptise M.D.   On: 08/14/2021 17:45        Scheduled Meds:  atorvastatin  80 mg Oral Daily   ezetimibe  10 mg Oral Daily   heparin  5,000 Units Subcutaneous Q8H   pantoprazole  40 mg Oral Daily   Continuous Infusions:   LOS: 2 days    Time spent: 35 minutes.     Elmarie Shiley, MD [facility]   If 7PM-7AM, please contact night-coverage [email or web address]  08/16/2021, 8:04 AM

## 2021-08-16 NOTE — TOC Initial Note (Signed)
Transition of Care [facility]) - Initial/Assessment Note    Patient Details  Name: Philip Trevino MRN: [id number] Date of Birth: 1996/01/14  Transition of Care [facility]) CM/SW Contact:    Tom-Johnson, Renea Ee, RN Phone Number: 08/16/2021, 4:19 PM  Clinical Narrative:                  CM spoke with patient at bedside about needs for post hospital transition and medication assistance. Patient states he currently lives with his cousin at a different address from the one listed. States he has one daughter but dies not live with him. Does not have a car or a job at this time. States he has been having difficulties with keeping a job due to his Kidney illness. Patient currently has Medicaid Prepaid/ Medicaid Healthy Blue. States he is unable to purchase medication due to loss of job. CM talked with him about sites he could go and look up for Job vacancies and apply.  MATCH medication assistance done and prescriptions to be sent to [place]. CM will continue to follow with needs.  Expected Discharge Plan: Home/Self Care Barriers to Discharge: Continued Medical Work up   Patient Goals and CMS Choice Patient states their goals for this hospitalization and ongoing recovery are:: To return home CMS Medicare.gov Compare Post Acute Care list provided to:: Patient Choice offered to / list presented to : Patient  Expected Discharge Plan and Services Expected Discharge Plan: Home/Self Care   Discharge Planning Services: CM Consult, Medication Assistance   Living arrangements for the past 2 months: Single Family Home                 DME Arranged: N/A DME Agency: NA       HH Arranged: NA HH Agency: NA        Prior Living Arrangements/Services Living arrangements for the past 2 months: Single Family Home Lives with:: Relatives (Cousin) Patient language and need for interpreter reviewed:: Yes Do you feel safe going back to the place where you live?: Yes      Need for Family Participation in  Patient Care: Yes (Comment)     Criminal Activity/Legal Involvement Pertinent to Current Situation/Hospitalization: No - Comment as needed  Activities of Daily Living Home Assistive Devices/Equipment: None ADL Screening (condition at time of admission) Patient's cognitive ability adequate to safely complete daily activities?: Yes Is the patient deaf or have difficulty hearing?: No Does the patient have difficulty seeing, even when wearing glasses/contacts?: No Does the patient have difficulty concentrating, remembering, or making decisions?: No Patient able to express need for assistance with ADLs?: No Does the patient have difficulty dressing or bathing?: No Independently performs ADLs?: Yes (appropriate for developmental age) Does the patient have difficulty walking or climbing stairs?: No Weakness of Legs: None Weakness of Arms/Hands: None  Permission Sought/Granted Permission sought to share information with : Case Manager, Customer service manager, Family Supports Permission granted to share information with : Yes, Verbal Permission Granted              Emotional Assessment Appearance:: Appears stated age Attitude/Demeanor/Rapport: Engaged, Gracious Affect (typically observed): Accepting, Appropriate, Calm, Hopeful Orientation: : Oriented to Self, Oriented to Place, Oriented to  Time, Oriented to Situation Alcohol / Substance Use: Not Applicable Psych Involvement: No (comment)  Admission diagnosis:  Bloated abdomen [R14.0] Thrombocytosis [D75.839] AKI (acute kidney injury) ([place]) [N17.9] Renal failure, unspecified chronicity [N19] Patient Active Problem List   Diagnosis Date Noted   Leukocytosis 08/15/2021  Hyperlipidemia 08/15/2021   Pleural effusion 08/15/2021   AKI (acute kidney injury) ([place]) 09/27/2020   Anasarca 01/28/2020   ARF (acute renal failure) ([place]) 01/28/2020   Normochromic normocytic anemia 01/28/2020   Elevated blood pressure reading  01/28/2020   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/28/2020   Anemia, unspecified 01/28/2020   Benign essential HTN 01/28/2020   Nephrotic syndrome    PCP:  Kerin Perna, NP Pharmacy:   [facility] [facility], [place] [facility] [place] [address] Phone: [phone] Fax: [place] [facility] [place] [address] [place] [address] Phone: [phone] Fax: [phone]     Social Determinants of Health (SDOH) Interventions    Readmission Risk Interventions Readmission Risk Prevention Plan 09/28/2020  Post Dischage Appt Complete  Medication Screening Complete  Transportation Screening Complete  Some recent data might be hidden

## 2021-08-17 DIAGNOSIS — N179 Acute kidney failure, unspecified: Secondary | ICD-10-CM | POA: Diagnosis not present

## 2021-08-17 LAB — CBC
HCT: 26.4 % — ABNORMAL LOW (ref 39.0–52.0)
Hemoglobin: 9 g/dL — ABNORMAL LOW (ref 13.0–17.0)
MCH: 29.7 pg (ref 26.0–34.0)
MCHC: 34.1 g/dL (ref 30.0–36.0)
MCV: 87.1 fL (ref 80.0–100.0)
Platelets: 963 10*3/uL (ref 150–400)
RBC: 3.03 MIL/uL — ABNORMAL LOW (ref 4.22–5.81)
RDW: 12.2 % (ref 11.5–15.5)
WBC: 24.8 10*3/uL — ABNORMAL HIGH (ref 4.0–10.5)
nRBC: 0 % (ref 0.0–0.2)

## 2021-08-17 LAB — RENAL FUNCTION PANEL
Albumin: <1.5 g/dL — ABNORMAL LOW (ref 3.5–5.0)
Anion gap: 10 (ref 5–15)
BUN: 75 mg/dL — ABNORMAL HIGH (ref 6–20)
CO2: 18 mmol/L — ABNORMAL LOW (ref 22–32)
Calcium: 7 mg/dL — ABNORMAL LOW (ref 8.9–10.3)
Chloride: 102 mmol/L (ref 98–111)
Creatinine, Ser: 5.91 mg/dL — ABNORMAL HIGH (ref 0.61–1.24)
GFR, Estimated: 13 mL/min — ABNORMAL LOW (ref >60)
Glucose, Bld: 102 mg/dL — ABNORMAL HIGH (ref 70–99)
Phosphorus: 9.1 mg/dL — ABNORMAL HIGH (ref 2.5–4.6)
Potassium: 4.6 mmol/L (ref 3.5–5.1)
Sodium: 130 mmol/L — ABNORMAL LOW (ref 135–145)

## 2021-08-17 LAB — URINE CULTURE: Culture: <10000 — AB

## 2021-08-17 MED ORDER — AZITHROMYCIN 500 MG PO TABS
500.0000 mg | ORAL_TABLET | Freq: Every day | ORAL | Status: DC
  Administered 2021-08-17 – 2021-08-19 (×3): 500 mg via ORAL
  Filled 2021-08-17 (×3): qty 1

## 2021-08-17 NOTE — Progress Notes (Signed)
°   08/17/21 2054  Assess: MEWS Score  Temp (!) 102 F (38.9 C)  BP 140/90  Pulse Rate (!) 113  Resp (!) 30  SpO2 97 %  O2 Device Room Air  Assess: MEWS Score  MEWS Temp 2  MEWS Systolic 0  MEWS Pulse 2  MEWS RR 2  MEWS LOC 0  MEWS Score 6  MEWS Score Color Red  Assess: if the MEWS score is Yellow or Red  Were vital signs taken at a resting state? Yes  Focused Assessment Change from prior assessment (see assessment flowsheet)  Early Detection of Sepsis Score *See Row Information* High  MEWS guidelines implemented *See Row Information* Yes  Treat  MEWS Interventions Administered scheduled meds/treatments;Administered prn meds/treatments  Pain Scale 0-10  Pain Score 8  Pain Type Acute pain  Pain Location Flank  Pain Orientation Right  Pain Descriptors / Indicators Aching  Pain Intervention(s) Medication (See eMAR)  Take Vital Signs  Increase Vital Sign Frequency  Red: Q 1hr X 4 then Q 4hr X 4, if remains red, continue Q 4hrs  Notify: Charge Nurse/RN  Name of Charge Nurse/RN Notified [place], RN  Date Charge Nurse/RN Notified 08/17/21   Provider notified

## 2021-08-17 NOTE — Progress Notes (Signed)
Subjective:   975 of UOP recorded -  crt is a little better-  fever curve better-  WBC still up   Objective Vital signs in last 24 hours: Vitals:   08/16/21 1702 08/16/21 2039 08/17/21 0517 08/17/21 0853  BP: 131/76 120/90 (!) 143/88 125/70  Pulse: 90 100 (!) 101 94  Resp: 18 18 18 18   Temp: 98.2 F (36.8 C) 99.3 F (37.4 C) 99.3 F (37.4 C) 97.8 F (36.6 C)  TempSrc: Oral     SpO2: 100% 100% 100% 100%  Weight:      Height:       Weight change:   Intake/Output Summary (Last 24 hours) at 08/17/2021 1220 Last data filed at 08/17/2021 0957 Gross per 24 hour  Intake 1400 ml  Output 1125 ml  Net 275 ml     Assessment/Plan: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  In October/November crt was normal-  and 17 days ago crt was 2.2-  now over 6.  Ultrasound does not show obstruction-  urine studies appear nephrotic but with microscopic hematuria as well.  Big change in renal function in last 17 days would be unusual to see because of FSG.  possibly due to ATN from low BP and ACE or due to NSAIDS or UTI-  holding both meds and continuing to reassess function-  it was slow to improve but did see improvement last 24 hours -  now on rocephin for UTI-    Patient will need an alternative agent to treat his FSG in the future clearly is failing this current situation-  hesitate to use progaf with an elevated crt -  ? Rituxan -  not sure if I want to give him steroids again with his long term exposure.  He sounds excited about the prospect of using something different -  I would like to see crt better before I do this however if that will happen- also WBC at least trending better. Difficult to tell how much of AKI is due to kidney disease as opposed to something else-  are seeing some  improvement today.    2. Hypertension/volume  - has 1+ edema but he seems to think that it is better-  volume status worsening with IVF in past and crt no better-  have added on diuretics -  alb low so is third spacing -   UOP better-  cont diuretics  3.  Appetite sxms-  I guess could be due to uremia although usually 69 year olds can tolerate BUNs of 70's without sxms -  or could it be due to gerd from prednisone -  he says is a lot better  4. Anemia  - indicates long term CKD vs GIB possibly due to long term steroids -  have put him on protonix -  and give dose of ESA - at least stable  5.  Elevated WBC-  fever and pyuria-  have discussed with hospitalist-  will empirically treat for UTI-  rocephin-  wbc still up but temp curve better      Philip Trevino    Labs: Basic Metabolic Panel: Recent Labs  Lab 08/15/21 0925 08/16/21 0256 08/17/21 0140  NA 131* 137 130*  K 3.8 4.4 4.6  CL 104 100 102  CO2 20*  20* 18*  GLUCOSE 111* 106* 102*  BUN 77* 76* 75*  CREATININE 6.08* 6.36* 5.91*  CALCIUM 6.7* 7.7* 7.0*  PHOS  --  8.2* 9.1*   Liver Function Tests: Recent Labs  Lab 08/14/21 0938 08/16/21 0256 08/17/21 0140  AST 33  --   --   ALT 12  --   --   ALKPHOS 79  --   --   BILITOT 0.2*  --   --   PROT 5.9*  --   --   ALBUMIN <1.5* <1.5* <1.5*   Recent Labs  Lab 08/14/21 0938  LIPASE 45   No results for input(s): AMMONIA in the last 168 hours. CBC: Recent Labs  Lab 08/14/21 0938 08/15/21 0925 08/16/21 0256 08/17/21 0140  WBC 25.0* 26.7* 24.8* 24.8*  NEUTROABS 21.1*  --  21.0*  --   HGB 8.7* 9.0* 8.7* 9.0*  HCT 26.4* 27.9* 26.4* 26.4*  MCV 88.6 88.9 87.4 87.1  PLT 1,082* 1,030* 908* 963*   Cardiac Enzymes: No results for input(s): CKTOTAL, CKMB, CKMBINDEX, TROPONINI in the last 168 hours. CBG: No results for input(s): GLUCAP in the last 168 hours.  Iron Studies: No results for input(s): IRON, TIBC, TRANSFERRIN, FERRITIN in the last 72  hours. Studies/Results: No results found. Medications: Infusions:  cefTRIAXone (ROCEPHIN)  IV 1 g (08/17/21 1142)     Scheduled Medications:  atorvastatin  80 mg Oral Daily   azithromycin  500 mg Oral Daily   darbepoetin (ARANESP) injection - NON-DIALYSIS  100 mcg Subcutaneous Q Thu-1800   ezetimibe  10 mg Oral Daily   furosemide  80 mg Intravenous Q12H   heparin  5,000 Units Subcutaneous Q8H   pantoprazole  40 mg Oral Daily    have reviewed scheduled and prn medications.  Physical Exam: General: flat affect-  says maybe better Heart: RRR Lungs: mostly clear Abdomen: soft, non tender Extremities: pitting edema     08/17/2021,12:20 PM  LOS: 3 days

## 2021-08-17 NOTE — Progress Notes (Signed)
PROGRESS NOTE    Philip Trevino  GEX:[id number] DOB: [birth year] DOA: 08/14/2021 PCP: Kerin Perna, NP   Brief Narrative: 26 year old past medical history significant for focal segmental glomerulosclerosis, minimal-change disease, hypertension presents with multiple complaints.  Over the last 2 or 3 weeks he noticed loss of taste, fatigue, weakness.  Pleuritic chest pain.  Chest congestion.  Subjective fever.  Multiple episodes of diarrhea.  He was supposed to follow-up with nephrology, but due to transportation issue never follow-up.  Not taking any home medications.  Evaluation in the ED: COVID-negative, BUN 76, creatinine 6.4 albumin less than 1.5, total protein 5.9.  Chest x-ray with bibasilar chest density left greater than the right.  Finding suggestive of pleural effusion compressive atelectasis.     Assessment & Plan:   Principal Problem:   AKI (acute kidney injury) ([place]) Active Problems:   Anemia, unspecified   Benign essential HTN   Leukocytosis   Hyperlipidemia   Pleural effusion   1-AKI in the setting of FSG: -Renal biopsy-proven FSGS with frequent flares but usually associated with medication noncompliance -Normal creatinine October November, 2 weeks ago creatinine 2.2 presenting with a creatinine over 6. -AKI also could be related to ATN from low blood pressure, ace.  Also treated for UTI. -Appreciate nephrology assistance. -Nephrology considering further alternative agent to treat  FSG. Considering Rituxan.  -Cr: 6.0--6.3---5.9 -Started on IV  lasix.  -Some improvement renal function.   Hyperlipidemia:  Continue with Zetia and Lipitor  Hypertension: started on Lasix. Hold lisinopril due to AKI.   Bilateral pleural effusion Hypoalbuminemia -Suspect related to hypoalbuminemia, in setting of FSG. -Nephrology starting Lasix IV -Might need thoracentesis if leukocytosis does not improve and or if you require oxygen -Incentive spirometry.   Leukocytosis.//Low  grade fever. Treating for PNA Anemia thrombocytosis: UA: More than 50 white blood cell Urine culture: 10,000 insignificant growth Started Ceftriaxone.  Peripheral Smear: Leukocytosis and thrombocytosis.  Normocytic anemia. Started on Aranesp.  Chest x ray with atelectasis vs consolidation, will add Zithromax to cover for PNA>  WBC stable.  If not significant improvement might need repeat chest x ray and or thoracentesis.  Will check doppler.   Elevation of troponin: Likely demand in the setting of renal failure  Diarrhea:  Reported 2 episode of loose stool since yesterday Resolved.   Failure to thrive: Likely in the setting of AKI HIV nonreactive     Estimated body mass index is 24.93 kg/m as calculated from the following:   Height as of this encounter: 5\' 10"  (1.778 m).   Weight as of this encounter: 78.8 kg.   DVT prophylaxis: SCDs, Heparin  Code Status: Full code Family Communication: Care discussed with patient Disposition Plan:  Status is: Inpatient  Remains inpatient appropriate because: Management for acute renal failure    Consultants:  Nephrology  Procedures:    Antimicrobials:    Subjective: He is feeling ok, no more diarrhea.  No abdominal pain.   Objective: Vitals:   08/16/21 1702 08/16/21 2039 08/17/21 0517 08/17/21 0853  BP: 131/76 120/90 (!) 143/88 125/70  Pulse: 90 100 (!) 101 94  Resp: 18 18 18 18   Temp: 98.2 F (36.8 C) 99.3 F (37.4 C) 99.3 F (37.4 C) 97.8 F (36.6 C)  TempSrc: Oral     SpO2: 100% 100% 100% 100%  Weight:      Height:        Intake/Output Summary (Last 24 hours) at 08/17/2021 1520 Last data filed at 08/17/2021 0957 Gross per 24  hour  Intake 1160 ml  Output 925 ml  Net 235 ml    Filed Weights   08/15/21 1400  Weight: 78.8 kg    Examination:  General exam: NAD Respiratory system: BL crackles.  Cardiovascular system: S 1, S 2  RRR Gastrointestinal system: BS present, soft, nt Central nervous  system: no focal.  Extremities: no edema   Data Reviewed: I have personally reviewed following labs and imaging studies  CBC: Recent Labs  Lab 08/14/21 0938 08/15/21 0925 08/16/21 0256 08/17/21 0140  WBC 25.0* 26.7* 24.8* 24.8*  NEUTROABS 21.1*  --  21.0*  --   HGB 8.7* 9.0* 8.7* 9.0*  HCT 26.4* 27.9* 26.4* 26.4*  MCV 88.6 88.9 87.4 87.1  PLT 1,082* 1,030* 908* 963*    Basic Metabolic Panel: Recent Labs  Lab 08/14/21 0938 08/15/21 0925 08/16/21 0256 08/17/21 0140  NA 133* 131* 137 130*  K 3.9 3.8 4.4 4.6  CL 102 104 100 102  CO2 21* 20* 20* 18*  GLUCOSE 100* 111* 106* 102*  BUN 76* 77* 76* 75*  CREATININE 6.41* 6.08* 6.36* 5.91*  CALCIUM 7.1* 6.7* 7.7* 7.0*  MG 2.7*  --   --   --   PHOS  --   --  8.2* 9.1*    GFR: Estimated Creatinine Clearance: 19.9 mL/min (A) (by C-G formula based on SCr of 5.91 mg/dL (H)). Liver Function Tests: Recent Labs  Lab 08/14/21 0938 08/16/21 0256 08/17/21 0140  AST 33  --   --   ALT 12  --   --   ALKPHOS 79  --   --   BILITOT 0.2*  --   --   PROT 5.9*  --   --   ALBUMIN <1.5* <1.5* <1.5*    Recent Labs  Lab 08/14/21 0938  LIPASE 45    No results for input(s): AMMONIA in the last 168 hours. Coagulation Profile: No results for input(s): INR, PROTIME in the last 168 hours. Cardiac Enzymes: No results for input(s): CKTOTAL, CKMB, CKMBINDEX, TROPONINI in the last 168 hours. BNP (last 3 results) No results for input(s): PROBNP in the last 8760 hours. HbA1C: No results for input(s): HGBA1C in the last 72 hours. CBG: No results for input(s): GLUCAP in the last 168 hours. Lipid Profile: Recent Labs    08/15/21 0925  CHOL 155  HDL 18*  LDLCALC 113*  TRIG 121  CHOLHDL 8.6    Thyroid Function Tests: No results for input(s): TSH, T4TOTAL, FREET4, T3FREE, THYROIDAB in the last 72 hours. Anemia Panel: No results for input(s): VITAMINB12, FOLATE, FERRITIN, TIBC, IRON, RETICCTPCT in the last 72 hours. Sepsis Labs: No  results for input(s): PROCALCITON, LATICACIDVEN in the last 168 hours.  Recent Results (from the past 240 hour(s))  Resp Panel by RT-PCR (Flu A&B, Covid) Nasopharyngeal Swab     Status: None   Collection Time: 08/14/21  4:26 PM   Specimen: Nasopharyngeal Swab; Nasopharyngeal(NP) swabs in vial transport medium  Result Value Ref Range Status   SARS Coronavirus 2 by RT PCR NEGATIVE NEGATIVE Final    Comment: (NOTE) SARS-CoV-2 target nucleic acids are NOT DETECTED.  The SARS-CoV-2 RNA is generally detectable in upper respiratory specimens during the acute phase of infection. The lowest concentration of SARS-CoV-2 viral copies this assay can detect is 138 copies/mL. A negative result does not preclude SARS-Cov-2 infection and should not be used as the sole basis for treatment or other patient management decisions. A negative result may occur with  improper  specimen collection/handling, submission of specimen other than nasopharyngeal swab, presence of viral mutation(s) within the areas targeted by this assay, and inadequate number of viral copies(<138 copies/mL). A negative result must be combined with clinical observations, patient history, and epidemiological information. The expected result is Negative.  Fact Sheet for Patients:  EntrepreneurPulse.com.au  Fact Sheet for Healthcare Providers:  IncredibleEmployment.be  This test is no t yet approved or cleared by the [place] FDA and  has been authorized for detection and/or diagnosis of SARS-CoV-2 by FDA under an Emergency Use Authorization (EUA). This EUA will remain  in effect (meaning this test can be used) for the duration of the COVID-19 declaration under Section 564(b)(1) of the Act, 21 U.S.C.section 360bbb-3(b)(1), unless the authorization is terminated  or revoked sooner.       Influenza A by PCR NEGATIVE NEGATIVE Final   Influenza B by PCR NEGATIVE NEGATIVE Final    Comment:  (NOTE) The Xpert Xpress SARS-CoV-2/FLU/RSV plus assay is intended as an aid in the diagnosis of influenza from Nasopharyngeal swab specimens and should not be used as a sole basis for treatment. Nasal washings and aspirates are unacceptable for Xpert Xpress SARS-CoV-2/FLU/RSV testing.  Fact Sheet for Patients: EntrepreneurPulse.com.au  Fact Sheet for Healthcare Providers: IncredibleEmployment.be  This test is not yet approved or cleared by the [place] FDA and has been authorized for detection and/or diagnosis of SARS-CoV-2 by FDA under an Emergency Use Authorization (EUA). This EUA will remain in effect (meaning this test can be used) for the duration of the COVID-19 declaration under Section 564(b)(1) of the Act, 21 U.S.C. section 360bbb-3(b)(1), unless the authorization is terminated or revoked.  Performed at [facility], [place] [address], [place], [place] [address]   Culture, blood (routine x 2)     Status: None (Preliminary result)   Collection Time: 08/14/21  9:32 PM   Specimen: BLOOD RIGHT HAND  Result Value Ref Range Status   Specimen Description BLOOD RIGHT HAND  Final   Special Requests   Final    BOTTLES DRAWN AEROBIC AND ANAEROBIC Blood Culture results may not be optimal due to an inadequate volume of blood received in culture bottles   Culture   Final    NO GROWTH 3 DAYS Performed at [facility], [place] [address], [place], [place] [address]    Report Status PENDING  Incomplete  Culture, blood (routine x 2)     Status: None (Preliminary result)   Collection Time: 08/14/21  9:33 PM   Specimen: BLOOD  Result Value Ref Range Status   Specimen Description BLOOD LEFT ANTECUBITAL  Final   Special Requests   Final    BOTTLES DRAWN AEROBIC AND ANAEROBIC Blood Culture adequate volume   Culture   Final    NO GROWTH 3 DAYS Performed at [facility], [place] [address] [place], [place] [address]    Report Status  PENDING  Incomplete  Urine Culture     Status: Abnormal   Collection Time: 08/16/21  9:38 AM   Specimen: Urine, Clean Catch  Result Value Ref Range Status   Specimen Description URINE, CLEAN CATCH  Final   Special Requests NONE  Final   Culture (A)  Final    <10,000 COLONIES/mL INSIGNIFICANT GROWTH Performed at [facility], [place] [address], [place],  [address]    Report Status 08/17/2021 FINAL  Final          Radiology Studies: No results found.  Scheduled Meds:  atorvastatin  80 mg Oral Daily   azithromycin  500 mg Oral Daily   darbepoetin (ARANESP) injection - NON-DIALYSIS  100 mcg Subcutaneous Q Thu-1800   ezetimibe  10 mg Oral Daily   furosemide  80 mg Intravenous Q12H   heparin  5,000 Units Subcutaneous Q8H   pantoprazole  40 mg Oral Daily   Continuous Infusions:  cefTRIAXone (ROCEPHIN)  IV 1 g (08/17/21 1142)     LOS: 3 days    Time spent: 35 minutes.     Elmarie Shiley, MD [facility]   If 7PM-7AM, please contact night-coverage [email or web address]  08/17/2021, 3:20 PM

## 2021-08-18 ENCOUNTER — Encounter (HOSPITAL_COMMUNITY): Payer: Self-pay | Admitting: Internal Medicine

## 2021-08-18 ENCOUNTER — Inpatient Hospital Stay (HOSPITAL_COMMUNITY): Payer: Medicaid Other

## 2021-08-18 DIAGNOSIS — N179 Acute kidney failure, unspecified: Secondary | ICD-10-CM

## 2021-08-18 DIAGNOSIS — M7989 Other specified soft tissue disorders: Secondary | ICD-10-CM

## 2021-08-18 DIAGNOSIS — J869 Pyothorax without fistula: Secondary | ICD-10-CM | POA: Diagnosis not present

## 2021-08-18 LAB — CBC
HCT: 24.3 % — ABNORMAL LOW (ref 39.0–52.0)
Hemoglobin: 8.2 g/dL — ABNORMAL LOW (ref 13.0–17.0)
MCH: 29.4 pg (ref 26.0–34.0)
MCHC: 33.7 g/dL (ref 30.0–36.0)
MCV: 87.1 fL (ref 80.0–100.0)
Platelets: 788 10*3/uL — ABNORMAL HIGH (ref 150–400)
RBC: 2.79 MIL/uL — ABNORMAL LOW (ref 4.22–5.81)
RDW: 12.3 % (ref 11.5–15.5)
WBC: 24.4 10*3/uL — ABNORMAL HIGH (ref 4.0–10.5)
nRBC: 0 % (ref 0.0–0.2)

## 2021-08-18 LAB — LACTATE DEHYDROGENASE, PLEURAL OR PERITONEAL FLUID
LD, Fluid: >10000 U/L — ABNORMAL HIGH (ref 3–23)
LD, Fluid: >10000 U/L — ABNORMAL HIGH (ref 3–23)

## 2021-08-18 LAB — RENAL FUNCTION PANEL
Albumin: <1.5 g/dL — ABNORMAL LOW (ref 3.5–5.0)
Anion gap: 10 (ref 5–15)
BUN: 75 mg/dL — ABNORMAL HIGH (ref 6–20)
CO2: 19 mmol/L — ABNORMAL LOW (ref 22–32)
Calcium: 7 mg/dL — ABNORMAL LOW (ref 8.9–10.3)
Chloride: 104 mmol/L (ref 98–111)
Creatinine, Ser: 6.2 mg/dL — ABNORMAL HIGH (ref 0.61–1.24)
GFR, Estimated: 12 mL/min — ABNORMAL LOW (ref >60)
Glucose, Bld: 122 mg/dL — ABNORMAL HIGH (ref 70–99)
Phosphorus: 9.1 mg/dL — ABNORMAL HIGH (ref 2.5–4.6)
Potassium: 4.8 mmol/L (ref 3.5–5.1)
Sodium: 133 mmol/L — ABNORMAL LOW (ref 135–145)

## 2021-08-18 LAB — ALBUMIN, PLEURAL OR PERITONEAL FLUID
Albumin, Fluid: <1.5 g/dL
Albumin, Fluid: <1.5 g/dL

## 2021-08-18 LAB — PROTIME-INR
INR: 1.3 — ABNORMAL HIGH (ref 0.8–1.2)
Prothrombin Time: 16.1 seconds — ABNORMAL HIGH (ref 11.4–15.2)

## 2021-08-18 LAB — GLUCOSE, PLEURAL OR PERITONEAL FLUID
Glucose, Fluid: <20 mg/dL
Glucose, Fluid: <20 mg/dL

## 2021-08-18 LAB — PROTEIN, PLEURAL OR PERITONEAL FLUID
Total protein, fluid: <3 g/dL
Total protein, fluid: <3 g/dL

## 2021-08-18 MED ORDER — MIDAZOLAM HCL 2 MG/2ML IJ SOLN
INTRAMUSCULAR | Status: AC | PRN
  Administered 2021-08-18: 1 mg via INTRAVENOUS
  Administered 2021-08-18 (×5): .5 mg via INTRAVENOUS

## 2021-08-18 MED ORDER — SODIUM CHLORIDE 0.9 % IV SOLN
2.0000 g | INTRAVENOUS | Status: DC
  Administered 2021-08-18: 2 g via INTRAVENOUS
  Filled 2021-08-18: qty 2

## 2021-08-18 MED ORDER — LIDOCAINE HCL 1 % IJ SOLN
INTRAMUSCULAR | Status: AC
  Filled 2021-08-18: qty 10

## 2021-08-18 MED ORDER — PIPERACILLIN-TAZOBACTAM 3.375 G IVPB
3.3750 g | Freq: Three times a day (TID) | INTRAVENOUS | Status: DC
  Administered 2021-08-18 – 2021-08-21 (×9): 3.375 g via INTRAVENOUS
  Filled 2021-08-18 (×13): qty 50

## 2021-08-18 MED ORDER — HEPARIN SODIUM (PORCINE) 5000 UNIT/ML IJ SOLN
5000.0000 [IU] | Freq: Three times a day (TID) | INTRAMUSCULAR | Status: AC
  Administered 2021-08-19 (×3): 5000 [IU] via SUBCUTANEOUS
  Filled 2021-08-18 (×3): qty 1

## 2021-08-18 MED ORDER — MIDAZOLAM HCL 2 MG/2ML IJ SOLN
INTRAMUSCULAR | Status: AC
  Filled 2021-08-18: qty 6

## 2021-08-18 MED ORDER — FUROSEMIDE 10 MG/ML IJ SOLN
80.0000 mg | Freq: Four times a day (QID) | INTRAMUSCULAR | Status: DC
  Administered 2021-08-18 – 2021-08-20 (×9): 80 mg via INTRAVENOUS
  Filled 2021-08-18 (×10): qty 8

## 2021-08-18 NOTE — Consult Note (Signed)
Name: Philip Trevino MRN: [id number] DOB: [birth year]    ADMISSION DATE:  08/14/2021 CONSULTATION DATE:  08/18/2021   REFERRING MD :  Greig Castilla  CHIEF COMPLAINT: Bilateral pleural effusions, chest pain, fever   HISTORY OF PRESENT ILLNESS: 26 year old man with a diagnosis of renal biopsy-proven FSGS lost to renal follow-up over the past year, admitted 1/24 with generalized weakness and pleuritic chest pain.  He reports a cough minimally productive of clear sputum and bilateral chest pain when he would cough or take deep breaths.  Reports preceding URI symptoms and tested negative for COVID and flu x2, no sick contacts.  He tried marijuana to increase his appetite.  Initial labs showed BUN/creatinine of 76/6.4, baseline noted to be 2.2 about 3 weeks ago.  WBC count was 20 5K with hemoglobin of 8.7 Chest x-ray showed bibasal effusions  He was initially given IV fluids then developed fever, urine culture showed less than 10K organisms, started ceftriaxone and azithromycin to cover UTI and pneumonia.  HIV negative.  CT chest without contrast 1/28 showed large left and moderate right, partially loculated pleural effusions.  Mass effect on underlying lung with passive atelectasis Hence PCCM consulted  He states that he has not seen a nephrologist over the past year since he was "bouncing around"   PAST MEDICAL HISTORY :  Past Medical History:  Diagnosis Date   Nephrotic syndrome    Past Surgical History:  Procedure Laterality Date   IR FLUORO GUIDE CV LINE RIGHT  01/31/2020   IR US GUIDE VASC ACCESS RIGHT  01/31/2020   TONSILLECTOMY     Prior to Admission medications   Medication Sig Start Date End Date Taking? Authorizing Provider  furosemide (LASIX) 80 MG tablet Take 1 tablet (80 mg total) by mouth 2 (two) times daily. Patient taking differently: Take 80 mg by mouth daily. 10/03/20 08/14/21 Yes Dessa Phi, DO  predniSONE (DELTASONE) 20 MG tablet Take 60 mg by mouth daily with  breakfast. Continuously   Yes [provider]  Pseudoeph-CPM-DM-APAP (THERAFLU FLU/COLD/COUGH PO) Take 1 capsule by mouth daily as needed (For cold symptoms).   Yes [provider]  aspirin 81 MG chewable tablet Chew 1 tablet (81 mg total) by mouth daily. Patient not taking: Reported on 07/30/2021 10/03/20   Dessa Phi, DO  aspirin 81 MG chewable tablet CHEW 1 TABLET (81 MG TOTAL) BY MOUTH DAILY. Patient not taking: Reported on 07/30/2021 10/03/20 10/03/21  Dessa Phi, DO  atorvastatin (LIPITOR) 80 MG tablet Take 1 tablet (80 mg total) by mouth daily. Patient not taking: Reported on 07/30/2021 10/04/20   Dessa Phi, DO  atorvastatin (LIPITOR) 80 MG tablet TAKE 1 TABLET (80 MG TOTAL) BY MOUTH DAILY. Patient not taking: Reported on 07/30/2021 10/03/20 10/03/21  Dessa Phi, DO  ezetimibe (ZETIA) 10 MG tablet Take 1 tablet (10 mg total) by mouth daily. Patient not taking: Reported on 07/30/2021 10/04/20   Dessa Phi, DO  ezetimibe (ZETIA) 10 MG tablet TAKE 1 TABLET (10 MG TOTAL) BY MOUTH DAILY. Patient not taking: Reported on 07/30/2021 10/03/20 10/03/21  Dessa Phi, DO  furosemide (LASIX) 80 MG tablet TAKE 1 TABLET (80 MG TOTAL) BY MOUTH 2 (TWO) TIMES DAILY. Patient not taking: Reported on 07/30/2021 10/03/20 10/03/21  Dessa Phi, DO  lisinopril (ZESTRIL) 10 MG tablet Take 1 tablet (10 mg total) by mouth daily. Patient not taking: Reported on 07/30/2021 10/04/20   Dessa Phi, DO  lisinopril (ZESTRIL) 10 MG tablet TAKE 1 TABLET (10 MG TOTAL) BY MOUTH  DAILY. Patient not taking: Reported on 07/30/2021 10/03/20 10/03/21  Dessa Phi, DO   Allergies  Allergen Reactions   Broccoli [Brassica Oleracea] Hives    CAULIFLOWER (allergic, also)    FAMILY HISTORY:  Family History  Problem Relation Age of Onset   Healthy Mother    Healthy Father    SOCIAL HISTORY:  reports that he has been smoking. He has never used smokeless tobacco. He reports that he does not currently use  alcohol. He reports current drug use. Drug: Marijuana.  REVIEW OF SYSTEMS:    Positive for fever and chills Generalized weakness Shortness of breath Pleuritic chest pain  Constitutional: Negative for weight loss, malaise/fatigue and diaphoresis.  HENT: Negative for hearing loss, ear pain, nosebleeds, congestion, sore throat, neck pain, tinnitus and ear discharge.   Eyes: Negative for blurred vision, double vision, photophobia, pain, discharge and redness.  Respiratory: Negative for cough, hemoptysis, sputum production, wheezing and stridor.   Cardiovascular: Negative for palpitations, orthopnea, claudication, leg swelling and PND.  Gastrointestinal: Negative for heartburn, nausea, vomiting, abdominal pain, diarrhea, constipation, blood in stool and melena.  Genitourinary: Negative for dysuria, urgency, frequency, hematuria and flank pain.  Musculoskeletal: Negative for myalgias, back pain, joint pain and falls.  Skin: Negative for itching and rash.  Neurological: Negative for dizziness, tingling, tremors, sensory change, speech change, focal weakness, seizures, loss of consciousness, weakness and headaches.  Endo/Heme/Allergies: Negative for environmental allergies and polydipsia. Does not bruise/bleed easily.  SUBJECTIVE:   VITAL SIGNS: Temp:  [98.2 F (36.8 C)-102 F (38.9 C)] 100.9 F (38.3 C) (01/28 0611) Pulse Rate:  [88-113] 105 (01/28 0611) Resp:  [17-30] 18 (01/28 0611) BP: (109-151)/(69-90) 151/90 (01/28 0611) SpO2:  [93 %-100 %] 93 % (01/28 0611)  PHYSICAL EXAMINATION: Gen. Pleasant, well-nourished, in no distress, anxious affect ENT - no lesions, no post nasal drip Neck: No JVD, no thyromegaly, no carotid bruits Lungs: no use of accessory muscles, dullness to percussion at both bases, decreased bilateral rales or rhonchi  Cardiovascular: Rhythm regular, heart sounds  normal, no murmurs, no peripheral edema Abdomen: soft and non-tender, no hepatosplenomegaly, BS  normal. Musculoskeletal: No deformities, no cyanosis or clubbing Neuro:  alert, non focal Skin:  Warm, no lesions/ rash   Recent Labs  Lab 08/16/21 0256 08/17/21 0140 08/18/21 0157  NA 137 130* 133*  K 4.4 4.6 4.8  CL 100 102 104  CO2 20* 18* 19*  BUN 76* 75* 75*  CREATININE 6.36* 5.91* 6.20*  GLUCOSE 106* 102* 122*   Recent Labs  Lab 08/16/21 0256 08/17/21 0140 08/18/21 0157  HGB 8.7* 9.0* 8.2*  HCT 26.4* 26.4* 24.3*  WBC 24.8* 24.8* 24.4*  PLT 908* 963* 788*   CT CHEST WO CONTRAST  Result Date: 08/18/2021 CLINICAL DATA:  26 year old male with history of fever of unknown origin. EXAM: CT CHEST WITHOUT CONTRAST TECHNIQUE: Multidetector CT imaging of the chest was performed following the standard protocol without IV contrast. RADIATION DOSE REDUCTION: This exam was performed according to the departmental dose-optimization program which includes automated exposure control, adjustment of the mA and/or kV according to patient size and/or use of iterative reconstruction technique. COMPARISON:  No priors. FINDINGS: Cardiovascular: Heart size is normal. There is no significant pericardial fluid, thickening or pericardial calcification. No atherosclerotic calcifications are noted in the thoracic aorta or the coronary arteries. Diffuse low attenuation throughout the intravascular compartment, suggesting anemia. Mediastinum/Nodes: Multiple prominent borderline enlarged mediastinal and bilateral hilar lymph nodes are noted, nonspecific, but likely reactive. Esophagus is unremarkable  in appearance. No axillary lymphadenopathy. Lungs/Pleura: Large left and moderate right pleural effusions. These are present predominantly posteriorly, however, these exerts substantial mass effect upon the underlying lung which is distorted, giving the appearance suggestive of probable loculation. This is associated with substantial passive atelectasis in the lower lobes of the lungs bilaterally, as well as the  posterior aspect of the inferior segment of the lingula. Upper Abdomen: Unremarkable. Musculoskeletal: There are no aggressive appearing lytic or blastic lesions noted in the visualized portions of the skeleton. IMPRESSION: 1. Large left and moderate right pleural effusions, which are likely partially loculated. Given the patient's history of fever, the possibility of empyema should be considered. These are associated with substantial passive atelectasis in the lung bases bilaterally. 2. Diffuse low attenuation throughout the intravascular compartment suggesting anemia. Electronically Signed   By: Vinnie Langton M.D.   On: 08/18/2021 08:32    ASSESSMENT / PLAN:  Bilateral pleural effusions, appear partially loculated on CT scan. He has fever and leukocytosis and is immunocompromised with a history of FSGS/nephrotic syndrome  -Bedside ultrasound shows bilateral loculated effusions without a clear window to perform thoracenteses or ultrasound-guided chest tube (see pics in media tab) -At the minimum he will need CT-guided chest tube placement with lytics, based on response to this he may even need thoracic surgery consultation -I discussed risks and benefits of above procedures with the patient and he is willing to proceed.  We will place IR consultation -Would use Zosyn instead of cefepime/azithromycin to cover organisms including anaerobes  AKI/FSGS -Per renal  Kara Mead MD. FCCP. [facility] Pager [phone] If no response call [phone]    08/18/2021, 11:09 AM

## 2021-08-18 NOTE — Progress Notes (Signed)
PROGRESS NOTE    Philip Trevino  PNT:[id number] DOB: 06-Dec-1995 DOA: 08/14/2021 PCP: Kerin Perna, NP   Brief Narrative: 26 year old past medical history significant for focal segmental glomerulosclerosis, minimal-change disease, hypertension presents with multiple complaints.  Over the last 2 or 3 weeks he noticed loss of taste, fatigue, weakness.  Pleuritic chest pain.  Chest congestion.  Subjective fever.  Multiple episodes of diarrhea.  He was supposed to follow-up with nephrology, but due to transportation issue never follow-up.  Not taking any home medications.  Evaluation in the ED: COVID-negative, BUN 76, creatinine 6.4 albumin less than 1.5, total protein 5.9.  Chest x-ray with bibasilar chest density left greater than the right.  Finding suggestive of pleural effusion compressive atelectasis.    Patient spike fever, WBC continue to be elevated. CT chest showed BL pleural effusion/ loculated. CCM consulted for thoracentesis. CCM performed bedside US which showed bilateral loculated effusion without clear window. Recommendation was for IR chest tube placement.   Assessment & Plan:   Principal Problem:   AKI (acute kidney injury) ([place]) Active Problems:   Anemia, unspecified   Benign essential HTN   Leukocytosis   Hyperlipidemia   Pleural effusion   1-AKI in the setting of FSG: Hyponatremia; in setting renal failure -Renal biopsy-proven FSGS with frequent flares but usually associated with medication noncompliance -Normal creatinine October November, 2 weeks ago creatinine 2.2 presenting with a creatinine over 6. -AKI also could be related to ATN from low blood pressure, ace.  Also treated for UTI. -Appreciate nephrology assistance. -Nephrology considering further alternative agent to treat  FSG. Considering Rituxan.  -Cr: 6.0--6.3---5.9--6.2 -Started on IV  lasix.    Hyperlipidemia:  Continue with Zetia and Lipitor  Hypertension: started on Lasix. Hold lisinopril  due to AKI.   Bilateral pleural effusion Leukocytosis, Fever. Concern for Empyema.  -Peripheral Smear: Leukocytosis and thrombocytosis.  Normocytic anemia. -Chest x ray with atelectasis vs consolidation. -Spike fever 1/27. CT chest 1/28 showed worsening BL pleural effusion left worse than right, loculated, consider empyema.  -CCM consulted, no clear window for safe thoracentesis, concern for empyema. -IR consulted for Chest tube placement.  -Change antibiotics to Zosyn   Anemia thrombocytosis: On Aranesp.   Hypoalbuminemia -Suspect related to hypoalbuminemia, in setting of FSG.  Fever; check doppler LE>    Elevation of troponin: Likely demand in the setting of renal failure  Diarrhea:  Reported 2 episode of loose stool since yesterday Resolved.   Failure to thrive: Likely in the setting of AKI HIV nonreactive     Estimated body mass index is 24.93 kg/m as calculated from the following:   Height as of this encounter: 5\' 10"  (1.778 m).   Weight as of this encounter: 78.8 kg.   DVT prophylaxis: SCDs, Heparin  Code Status: Full code Family Communication: Care discussed with patient Disposition Plan:  Status is: Inpatient  Remains inpatient appropriate because: Management for acute renal failure    Consultants:  Nephrology  Procedures:    Antimicrobials:    Subjective: He is concern, he is not getting better. We spoke about next step in management, CCM consult, thoracentesis, change in antibiotics.  He report chest pain, breathing better after he got pain med.s   Objective: Vitals:   08/18/21 0024 08/18/21 0119 08/18/21 0611 08/18/21 1114  BP: 109/77 111/69 (!) 151/90 120/89  Pulse: 99 88 (!) 105 91  Resp: 20 18 18 18   Temp: 98.6 F (37 C) 98.6 F (37 C) (!) 100.9 F (38.3 C) 98.4  F (36.9 C)  TempSrc: Oral Oral Oral Oral  SpO2: 96% 100% 93% 100%  Weight:      Height:        Intake/Output Summary (Last 24 hours) at 08/18/2021 1235 Last data  filed at 08/18/2021 0600 Gross per 24 hour  Intake 1000 ml  Output 750 ml  Net 250 ml    Filed Weights   08/15/21 1400  Weight: 78.8 kg    Examination:  General exam: NAD  Respiratory system: BL crackles.  Cardiovascular system: S1 ,S 2 RRR Gastrointestinal system: BS present, soft, nt Central nervous system: Non focal.  Extremities: No edema   Data Reviewed: I have personally reviewed following labs and imaging studies  CBC: Recent Labs  Lab 08/14/21 0938 08/15/21 0925 08/16/21 0256 08/17/21 0140 08/18/21 0157  WBC 25.0* 26.7* 24.8* 24.8* 24.4*  NEUTROABS 21.1*  --  21.0*  --   --   HGB 8.7* 9.0* 8.7* 9.0* 8.2*  HCT 26.4* 27.9* 26.4* 26.4* 24.3*  MCV 88.6 88.9 87.4 87.1 87.1  PLT 1,082* 1,030* 908* 963* 788*    Basic Metabolic Panel: Recent Labs  Lab 08/14/21 0938 08/15/21 0925 08/16/21 0256 08/17/21 0140 08/18/21 0157  NA 133* 131* 137 130* 133*  K 3.9 3.8 4.4 4.6 4.8  CL 102 104 100 102 104  CO2 21* 20* 20* 18* 19*  GLUCOSE 100* 111* 106* 102* 122*  BUN 76* 77* 76* 75* 75*  CREATININE 6.41* 6.08* 6.36* 5.91* 6.20*  CALCIUM 7.1* 6.7* 7.7* 7.0* 7.0*  MG 2.7*  --   --   --   --   PHOS  --   --  8.2* 9.1* 9.1*    GFR: Estimated Creatinine Clearance: 19 mL/min (A) (by C-G formula based on SCr of 6.2 mg/dL (H)). Liver Function Tests: Recent Labs  Lab 08/14/21 0938 08/16/21 0256 08/17/21 0140 08/18/21 0157  AST 33  --   --   --   ALT 12  --   --   --   ALKPHOS 79  --   --   --   BILITOT 0.2*  --   --   --   PROT 5.9*  --   --   --   ALBUMIN <1.5* <1.5* <1.5* <1.5*    Recent Labs  Lab 08/14/21 0938  LIPASE 45    No results for input(s): AMMONIA in the last 168 hours. Coagulation Profile: No results for input(s): INR, PROTIME in the last 168 hours. Cardiac Enzymes: No results for input(s): CKTOTAL, CKMB, CKMBINDEX, TROPONINI in the last 168 hours. BNP (last 3 results) No results for input(s): PROBNP in the last 8760 hours. HbA1C: No  results for input(s): HGBA1C in the last 72 hours. CBG: No results for input(s): GLUCAP in the last 168 hours. Lipid Profile: No results for input(s): CHOL, HDL, LDLCALC, TRIG, CHOLHDL, LDLDIRECT in the last 72 hours.  Thyroid Function Tests: No results for input(s): TSH, T4TOTAL, FREET4, T3FREE, THYROIDAB in the last 72 hours. Anemia Panel: No results for input(s): VITAMINB12, FOLATE, FERRITIN, TIBC, IRON, RETICCTPCT in the last 72 hours. Sepsis Labs: No results for input(s): PROCALCITON, LATICACIDVEN in the last 168 hours.  Recent Results (from the past 240 hour(s))  Resp Panel by RT-PCR (Flu A&B, Covid) Nasopharyngeal Swab     Status: None   Collection Time: 08/14/21  4:26 PM   Specimen: Nasopharyngeal Swab; Nasopharyngeal(NP) swabs in vial transport medium  Result Value Ref Range Status   SARS Coronavirus 2 by RT  PCR NEGATIVE NEGATIVE Final    Comment: (NOTE) SARS-CoV-2 target nucleic acids are NOT DETECTED.  The SARS-CoV-2 RNA is generally detectable in upper respiratory specimens during the acute phase of infection. The lowest concentration of SARS-CoV-2 viral copies this assay can detect is 138 copies/mL. A negative result does not preclude SARS-Cov-2 infection and should not be used as the sole basis for treatment or other patient management decisions. A negative result may occur with  improper specimen collection/handling, submission of specimen other than nasopharyngeal swab, presence of viral mutation(s) within the areas targeted by this assay, and inadequate number of viral copies(<138 copies/mL). A negative result must be combined with clinical observations, patient history, and epidemiological information. The expected result is Negative.  Fact Sheet for Patients:  EntrepreneurPulse.com.au  Fact Sheet for Healthcare Providers:  IncredibleEmployment.be  This test is no t yet approved or cleared by the [place] FDA and  has  been authorized for detection and/or diagnosis of SARS-CoV-2 by FDA under an Emergency Use Authorization (EUA). This EUA will remain  in effect (meaning this test can be used) for the duration of the COVID-19 declaration under Section 564(b)(1) of the Act, 21 U.S.C.section 360bbb-3(b)(1), unless the authorization is terminated  or revoked sooner.       Influenza A by PCR NEGATIVE NEGATIVE Final   Influenza B by PCR NEGATIVE NEGATIVE Final    Comment: (NOTE) The Xpert Xpress SARS-CoV-2/FLU/RSV plus assay is intended as an aid in the diagnosis of influenza from Nasopharyngeal swab specimens and should not be used as a sole basis for treatment. Nasal washings and aspirates are unacceptable for Xpert Xpress SARS-CoV-2/FLU/RSV testing.  Fact Sheet for Patients: EntrepreneurPulse.com.au  Fact Sheet for Healthcare Providers: IncredibleEmployment.be  This test is not yet approved or cleared by the [place] FDA and has been authorized for detection and/or diagnosis of SARS-CoV-2 by FDA under an Emergency Use Authorization (EUA). This EUA will remain in effect (meaning this test can be used) for the duration of the COVID-19 declaration under Section 564(b)(1) of the Act, 21 U.S.C. section 360bbb-3(b)(1), unless the authorization is terminated or revoked.  Performed at [facility], [place] [address] [place], [place] [address]   Culture, blood (routine x 2)     Status: None (Preliminary result)   Collection Time: 08/14/21  9:32 PM   Specimen: BLOOD RIGHT HAND  Result Value Ref Range Status   Specimen Description BLOOD RIGHT HAND  Final   Special Requests   Final    BOTTLES DRAWN AEROBIC AND ANAEROBIC Blood Culture results may not be optimal due to an inadequate volume of blood received in culture bottles   Culture   Final    NO GROWTH 4 DAYS Performed at [facility], [place] [address] [place], [place] [address]    Report Status  PENDING  Incomplete  Culture, blood (routine x 2)     Status: None (Preliminary result)   Collection Time: 08/14/21  9:33 PM   Specimen: BLOOD  Result Value Ref Range Status   Specimen Description BLOOD LEFT ANTECUBITAL  Final   Special Requests   Final    BOTTLES DRAWN AEROBIC AND ANAEROBIC Blood Culture adequate volume   Culture   Final    NO GROWTH 4 DAYS Performed at [facility], [place] [address] [place], [place] [address]    Report Status PENDING  Incomplete  Urine Culture     Status: Abnormal   Collection Time: 08/16/21  9:38 AM  Specimen: Urine, Clean Catch  Result Value Ref Range Status   Specimen Description URINE, CLEAN CATCH  Final   Special Requests NONE  Final   Culture (A)  Final    <10,000 COLONIES/mL INSIGNIFICANT GROWTH Performed at [facility], [address], [place], [place] [address]    Report Status 08/17/2021 FINAL  Final          Radiology Studies: CT CHEST WO CONTRAST  Result Date: 08/18/2021 CLINICAL DATA:  26 year old male with history of fever of unknown origin. EXAM: CT CHEST WITHOUT CONTRAST TECHNIQUE: Multidetector CT imaging of the chest was performed following the standard protocol without IV contrast. RADIATION DOSE REDUCTION: This exam was performed according to the departmental dose-optimization program which includes automated exposure control, adjustment of the mA and/or kV according to patient size and/or use of iterative reconstruction technique. COMPARISON:  No priors. FINDINGS: Cardiovascular: Heart size is normal. There is no significant pericardial fluid, thickening or pericardial calcification. No atherosclerotic calcifications are noted in the thoracic aorta or the coronary arteries. Diffuse low attenuation throughout the intravascular compartment, suggesting anemia. Mediastinum/Nodes: Multiple prominent borderline enlarged mediastinal and bilateral hilar lymph nodes are noted, nonspecific, but likely reactive. Esophagus is  unremarkable in appearance. No axillary lymphadenopathy. Lungs/Pleura: Large left and moderate right pleural effusions. These are present predominantly posteriorly, however, these exerts substantial mass effect upon the underlying lung which is distorted, giving the appearance suggestive of probable loculation. This is associated with substantial passive atelectasis in the lower lobes of the lungs bilaterally, as well as the posterior aspect of the inferior segment of the lingula. Upper Abdomen: Unremarkable. Musculoskeletal: There are no aggressive appearing lytic or blastic lesions noted in the visualized portions of the skeleton. IMPRESSION: 1. Large left and moderate right pleural effusions, which are likely partially loculated. Given the patient's history of fever, the possibility of empyema should be considered. These are associated with substantial passive atelectasis in the lung bases bilaterally. 2. Diffuse low attenuation throughout the intravascular compartment suggesting anemia. Electronically Signed   By: Vinnie Langton M.D.   On: 08/18/2021 08:32        Scheduled Meds:  atorvastatin  80 mg Oral Daily   azithromycin  500 mg Oral Daily   darbepoetin (ARANESP) injection - NON-DIALYSIS  100 mcg Subcutaneous Q Thu-1800   ezetimibe  10 mg Oral Daily   furosemide  80 mg Intravenous Q6H   [START ON 08/19/2021] heparin  5,000 Units Subcutaneous Q8H   pantoprazole  40 mg Oral Daily   Continuous Infusions:  ceFEPime (MAXIPIME) IV 2 g (08/18/21 0848)     LOS: 4 days    Time spent: 35 minutes.     Elmarie Shiley, MD [facility]   If 7PM-7AM, please contact night-coverage [email or web address]  08/18/2021, 12:35 PM

## 2021-08-18 NOTE — Plan of Care (Signed)

## 2021-08-18 NOTE — Progress Notes (Addendum)
Pharmacy Antibiotic Note  Philip Trevino is a 26 y.o. male admitted on 08/14/2021 with history of FSG confirmed with renal biopsy. Patient presented with 2-3 weeks loss of taste, fatigue, weakness, pleuritic chest pain, chest congestion subjective fever. CXR showing atelectasis vs. Consolidation with concern for PNA.  1/28 Patient fever spiked to 102 with continued leukocytosis (WBC 24.4). Pharmacy has been consulted for cefepime dosing.  Plan: START Cefepime 2g IV Q24H  CONTINUE Azithromycin 500 mg PO daily x 5 days  F/U cefepime stop date, renal function, clinical improvement, de-escalation    ADDENDUM:  Thoracentesis performed by CCM with bedside US revealing bilateral loculated effusion with concern for empyema. Pharmacy has been consulted to change antibiotics to zosyn. - START Zosyn 3.375 g IV Q8H  - F/U stop date, clinical improvement, cultures   Height: 5\' 10"  (177.8 cm) Weight: 78.8 kg (173 lb 11.6 oz) IBW/kg (Calculated) : 73  Temp (24hrs), Avg:99.9 F (37.7 C), Min:97.8 F (36.6 C), Max:102 F (38.9 C)  Recent Labs  Lab 08/14/21 0938 08/15/21 0925 08/16/21 0256 08/17/21 0140 08/18/21 0157  WBC 25.0* 26.7* 24.8* 24.8* 24.4*  CREATININE 6.41* 6.08* 6.36* 5.91* 6.20*    Estimated Creatinine Clearance: 19 mL/min (A) (by C-G formula based on SCr of 6.2 mg/dL (H)).    Allergies  Allergen Reactions   Broccoli [Brassica Oleracea] Hives    CAULIFLOWER (allergic, also)    Antimicrobials this admission: Zosyn 1/28>> Cefepime 1/28 x1   CTX 1/26 >> 1/27 Azithro PO 1/27 >>>(1/31)  Microbiology results: 1/26 urine: insignificant growth 1/24 blood: ngtd  1/24 COVID and flu: neg 1/28 Bcx: sent  1/28 pleural fluid cx: sent   Thank you for allowing pharmacy to be a part of this patients care.  Adria Dill, PharmD PGY-1 Acute Care Resident  08/18/2021 8:19 AM

## 2021-08-18 NOTE — Procedures (Addendum)
Vascular and Interventional Radiology Procedure Note  Patient: Philip Trevino DOB: 05-11-1996 Medical Record Number: [id number] Note Date/Time: 08/18/21 2:59 PM   Performing Physician: Michaelle Birks, MD Assistant(s): None  Diagnosis: Bilateral pleural effusions. Question empyemas.  Procedure:  BILATERAL PIGTAIL THORACOSTOMY DRAINAGE CATHETER PLACEMENTS  Anesthesia:  Single Agent and Local Anesthetic Complications: None Estimated Blood Loss: Minimal Specimens: Sent for Gram Stain, Aerobe Culture, and Anerobe Culture  Findings:  Successful CT-guided placement of 12 F catheters into Left and Right chest..  Plan:  - Pigtail chest tubes to -20 mmHg suction. - VIR Service will follow.  See detailed procedure note with images in PACS. The patient tolerated the procedure well without incident or complication and was returned to Floor Bed in stable condition.    Michaelle Birks, MD [facility]   Pager. [place]

## 2021-08-18 NOTE — Consult Note (Signed)
Chief Complaint: Patient was seen in consultation today for bilateral chest tube placement Chief Complaint  Patient presents with   Chest Pain   loss of taste   Anorexia   at the request of Dr Elsworth Soho   Supervising Physician: Michaelle Birks  Patient Status: [facility] - In-pt  History of Present Illness: Philip Trevino is a 26 y.o. male  Nephrotic syndrome Renal biopsy proven focal segmental glomerulosclerosis  Came to Ed with SOB;  CP Renal functions rising Leukocytosis Developed fever Treated for PNA  HIV neg CT today:  IMPRESSION: 1. Large left and moderate right pleural effusions, which are likely partially loculated. Given the patient's history of fever, the possibility of empyema should be considered. These are associated with substantial passive atelectasis in the lung bases bilaterally. 2. Diffuse low attenuation throughout the intravascular compartment suggesting anemia.    PCCM requesting Bilat chest tube drains Dr Maryelizabeth Kaufmann has seen and reviewed imaging He approves procedure  Scheduled now for same    Past Medical History:  Diagnosis Date   Nephrotic syndrome     Past Surgical History:  Procedure Laterality Date   IR FLUORO GUIDE CV LINE RIGHT  01/31/2020   IR US GUIDE VASC ACCESS RIGHT  01/31/2020   TONSILLECTOMY      Allergies: Broccoli [brassica oleracea]  Medications: Prior to Admission medications   Medication Sig Start Date End Date Taking? Authorizing Provider  furosemide (LASIX) 80 MG tablet Take 1 tablet (80 mg total) by mouth 2 (two) times daily. Patient taking differently: Take 80 mg by mouth daily. 10/03/20 08/14/21 Yes Dessa Phi, DO  predniSONE (DELTASONE) 20 MG tablet Take 60 mg by mouth daily with breakfast. Continuously   Yes [provider]  Pseudoeph-CPM-DM-APAP (THERAFLU FLU/COLD/COUGH PO) Take 1 capsule by mouth daily as needed (For cold symptoms).   Yes [provider]  aspirin 81 MG chewable tablet Chew 1  tablet (81 mg total) by mouth daily. Patient not taking: Reported on 07/30/2021 10/03/20   Dessa Phi, DO  aspirin 81 MG chewable tablet CHEW 1 TABLET (81 MG TOTAL) BY MOUTH DAILY. Patient not taking: Reported on 07/30/2021 10/03/20 10/03/21  Dessa Phi, DO  atorvastatin (LIPITOR) 80 MG tablet Take 1 tablet (80 mg total) by mouth daily. Patient not taking: Reported on 07/30/2021 10/04/20   Dessa Phi, DO  atorvastatin (LIPITOR) 80 MG tablet TAKE 1 TABLET (80 MG TOTAL) BY MOUTH DAILY. Patient not taking: Reported on 07/30/2021 10/03/20 10/03/21  Dessa Phi, DO  ezetimibe (ZETIA) 10 MG tablet Take 1 tablet (10 mg total) by mouth daily. Patient not taking: Reported on 07/30/2021 10/04/20   Dessa Phi, DO  ezetimibe (ZETIA) 10 MG tablet TAKE 1 TABLET (10 MG TOTAL) BY MOUTH DAILY. Patient not taking: Reported on 07/30/2021 10/03/20 10/03/21  Dessa Phi, DO  furosemide (LASIX) 80 MG tablet TAKE 1 TABLET (80 MG TOTAL) BY MOUTH 2 (TWO) TIMES DAILY. Patient not taking: Reported on 07/30/2021 10/03/20 10/03/21  Dessa Phi, DO  lisinopril (ZESTRIL) 10 MG tablet Take 1 tablet (10 mg total) by mouth daily. Patient not taking: Reported on 07/30/2021 10/04/20   Dessa Phi, DO  lisinopril (ZESTRIL) 10 MG tablet TAKE 1 TABLET (10 MG TOTAL) BY MOUTH DAILY. Patient not taking: Reported on 07/30/2021 10/03/20 10/03/21  Dessa Phi, DO     Family History  Problem Relation Age of Onset   Healthy Mother    Healthy Father     Social History   Socioeconomic History   Marital status:  Significant Other    Spouse name: Not on file   Number of children: Not on file   Years of education: Not on file   Highest education level: Not on file  Occupational History   Not on file  Tobacco Use   Smoking status: Every Day   Smokeless tobacco: Never   Tobacco comments:    smoke marijuana  Vaping Use   Vaping Use: Never used  Substance and Sexual Activity   Alcohol use: Not Currently   Drug use: Yes     Types: Marijuana   Sexual activity: Not on file  Other Topics Concern   Not on file  Social History Narrative   Not on file   Social Determinants of Health   Financial Resource Strain: Not on file  Food Insecurity: Not on file  Transportation Needs: Not on file  Physical Activity: Not on file  Stress: Not on file  Social Connections: Not on file    Review of Systems: A 12 point ROS discussed and pertinent positives are indicated in the HPI above.  All other systems are negative.  Review of Systems  Constitutional:  Positive for activity change, appetite change and fatigue.  Respiratory:  Positive for shortness of breath.   Gastrointestinal:  Negative for abdominal pain.  Neurological:  Positive for weakness.  Psychiatric/Behavioral:  Negative for behavioral problems and confusion.    Vital Signs: BP 120/89    Pulse 91    Temp 98.4 F (36.9 C) (Oral)    Resp 18    Ht 5\' 10"  (1.778 m)    Wt 173 lb 11.6 oz (78.8 kg)    SpO2 100%    BMI 24.93 kg/m   Physical Exam Vitals reviewed.  Cardiovascular:     Rate and Rhythm: Normal rate and regular rhythm.  Pulmonary:     Effort: Pulmonary effort is normal.     Breath sounds: Wheezing present.  Musculoskeletal:        General: Normal range of motion.  Skin:    General: Skin is warm.  Neurological:     Mental Status: He is alert and oriented to person, place, and time.  Psychiatric:        Behavior: Behavior normal.    Imaging: DG Chest 2 View  Result Date: 08/14/2021 CLINICAL DATA:  Chest pain. EXAM: CHEST - 2 VIEW COMPARISON:  09/27/2020 FINDINGS: Bibasilar chest densities, left side greater than right. Findings are suggestive for bilateral pleural effusions. Suspect compressive atelectasis but cannot exclude basilar airspace disease or consolidation. Upper lungs are clear. Heart size is incompletely evaluated due to the basilar chest densities. Negative for pneumothorax. IMPRESSION: 1. Bibasilar chest densities, left side  greater than right. Findings are suggestive for pleural effusions with compressive atelectasis or consolidation. Left pleural effusion could be moderate in size. Electronically Signed   By: Markus Daft M.D.   On: 08/14/2021 10:02   CT CHEST WO CONTRAST  Result Date: 08/18/2021 CLINICAL DATA:  26 year old male with history of fever of unknown origin. EXAM: CT CHEST WITHOUT CONTRAST TECHNIQUE: Multidetector CT imaging of the chest was performed following the standard protocol without IV contrast. RADIATION DOSE REDUCTION: This exam was performed according to the departmental dose-optimization program which includes automated exposure control, adjustment of the mA and/or kV according to patient size and/or use of iterative reconstruction technique. COMPARISON:  No priors. FINDINGS: Cardiovascular: Heart size is normal. There is no significant pericardial fluid, thickening or pericardial calcification. No atherosclerotic calcifications are noted in  the thoracic aorta or the coronary arteries. Diffuse low attenuation throughout the intravascular compartment, suggesting anemia. Mediastinum/Nodes: Multiple prominent borderline enlarged mediastinal and bilateral hilar lymph nodes are noted, nonspecific, but likely reactive. Esophagus is unremarkable in appearance. No axillary lymphadenopathy. Lungs/Pleura: Large left and moderate right pleural effusions. These are present predominantly posteriorly, however, these exerts substantial mass effect upon the underlying lung which is distorted, giving the appearance suggestive of probable loculation. This is associated with substantial passive atelectasis in the lower lobes of the lungs bilaterally, as well as the posterior aspect of the inferior segment of the lingula. Upper Abdomen: Unremarkable. Musculoskeletal: There are no aggressive appearing lytic or blastic lesions noted in the visualized portions of the skeleton. IMPRESSION: 1. Large left and moderate right pleural  effusions, which are likely partially loculated. Given the patient's history of fever, the possibility of empyema should be considered. These are associated with substantial passive atelectasis in the lung bases bilaterally. 2. Diffuse low attenuation throughout the intravascular compartment suggesting anemia. Electronically Signed   By: Vinnie Langton M.D.   On: 08/18/2021 08:32   US RENAL  Result Date: 08/14/2021 CLINICAL DATA:  Bloated abdomen EXAM: RENAL / URINARY TRACT ULTRASOUND COMPLETE COMPARISON:  None. FINDINGS: Right Kidney: Renal measurements: 14.6 x 5.5 x 6.6 cm = volume: 278 mL. Diffusely increased echotexture. No mass or hydronephrosis Left Kidney: Renal measurements: 14.8 x 6.0 x 5.3 cm = volume: 248 mL. Diffusely increased echotexture. No mass or hydronephrosis. Bladder: Appears normal for degree of bladder distention. Other: None. IMPRESSION: Increased echotexture throughout the kidneys. This can be seen with chronic renal disease, sickle cell disease, HIV, but also can be seen as a normal variant. No hydronephrosis. Electronically Signed   By: Rolm Baptise M.D.   On: 08/14/2021 17:45    Labs:  CBC: Recent Labs    08/15/21 0925 08/16/21 0256 08/17/21 0140 08/18/21 0157  WBC 26.7* 24.8* 24.8* 24.4*  HGB 9.0* 8.7* 9.0* 8.2*  HCT 27.9* 26.4* 26.4* 24.3*  PLT 1,030* 908* 963* 788*    COAGS: No results for input(s): INR, APTT in the last 8760 hours.  BMP: Recent Labs    08/15/21 0925 08/16/21 0256 08/17/21 0140 08/18/21 0157  NA 131* 137 130* 133*  K 3.8 4.4 4.6 4.8  CL 104 100 102 104  CO2 20* 20* 18* 19*  GLUCOSE 111* 106* 102* 122*  BUN 77* 76* 75* 75*  CALCIUM 6.7* 7.7* 7.0* 7.0*  CREATININE 6.08* 6.36* 5.91* 6.20*  GFRNONAA 12* 12* 13* 12*    LIVER FUNCTION TESTS: Recent Labs    09/27/20 1330 09/28/20 0327 07/30/21 1717 08/14/21 0938 08/16/21 0256 08/17/21 0140 08/18/21 0157  BILITOT 0.5  --  0.4 0.2*  --   --   --   AST 16  --  19 33  --   --    --   ALT 18  --  10 12  --   --   --   ALKPHOS 54  --  59 79  --   --   --   PROT 3.3*  --  3.8* 5.9*  --   --   --   ALBUMIN <1.0*   < > <1.5* <1.5* <1.5* <1.5* <1.5*   < > = values in this interval not displayed.    TUMOR MARKERS: No results for input(s): AFPTM, CEA, CA199, CHROMGRNA in the last 8760 hours.  Assessment and Plan:  Bilateral empyema Scheduled for Bilateral chest tube drains in IR  Risks and benefits discussed with the patient including bleeding, infection, damage to adjacent structures, pneumothorax and sepsis. Pt is not npo--- Dr Maryelizabeth Kaufmann aware  All of the patient's questions were answered, patient is agreeable to proceed. Consent signed and in chart.   Thank you for this interesting consult.  I greatly enjoyed meeting Philip Trevino and look forward to participating in their care.  A copy of this report was sent to the requesting provider on this date.  Electronically Signed: Lavonia Drafts, PA-C 08/18/2021, 12:26 PM   I spent a total of 40 Minutes    in face to face in clinical consultation, greater than 50% of which was counseling/coordinating care for Bilateral chest tube placement

## 2021-08-18 NOTE — Progress Notes (Signed)
Subjective:   1050 of UOP recorded -  crt worse again -   fever curve worse-  WBC still up -  CT of chest -  maybe empyema-  abx have been broadened and CCM called for maybe thoracentesis   Objective Vital signs in last 24 hours: Vitals:   08/17/21 2301 08/18/21 0024 08/18/21 0119 08/18/21 0611  BP: 117/82 109/77 111/69 (!) 151/90  Pulse: (!) 103 99 88 (!) 105  Resp: 18 20 18 18   Temp: 99.7 F (37.6 C) 98.6 F (37 C) 98.6 F (37 C) (!) 100.9 F (38.3 C)  TempSrc: Oral Oral Oral Oral  SpO2: 100% 96% 100% 93%  Weight:      Height:       Weight change:   Intake/Output Summary (Last 24 hours) at 08/18/2021 1030 Last data filed at 08/18/2021 0600 Gross per 24 hour  Intake 1000 ml  Output 750 ml  Net 250 ml     Assessment/Plan: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  In October/November crt was normal-  and 17 days ago crt was 2.2-  now over 6.  Ultrasound does not show obstruction-  urine studies appear nephrotic but with microscopic hematuria as well.  Big change in renal function in last 17 days would be unusual to see because of FSG.  possibly due to ATN from low BP and ACE or due to NSAIDS or UTI-  holding both meds and continuing to reassess function-  it was slow to improve but did see improvement yesterday but worse today agan  Patient will need an alternative agent to treat his FSG in the future clearly is failing this current situation-  hesitate to use progaf with an elevated crt -  ? Rituxan -  not sure if I want to give him steroids again with his long term exposure.  He sounds excited about the prospect of using something different -  I would like to see crt better before I do this however if that will happen- also WBC at least trending better. Difficult to tell how  much of AKI is due to kidney disease as opposed to something else-  still not safe to give rituxan today I am afraid   2. Hypertension/volume  - has 1+ edema but he seems to think that it is better-  volume status worsening with IVF in past and crt no better-  have added on diuretics -  alb low so is third spacing -   UOP is there -   cont diuretics but will inc dose 3.  Appetite sxms-  I guess could be due to uremia although usually 57 year olds can tolerate BUNs of 70's without sxms -  or could it be due to gerd from prednisone -  he says is a lot better  4. Anemia  - indicates long term CKD vs GIB possibly due to long term steroids -  have put him on protonix -  and give dose of ESA - was stable -  now worse again  5.  Elevated WBC-  fever. Leukopenia and pyuria-  have discussed with hospitalist-  had been treating for UTI-    wbc still up and worse fever so now thinking possible empyema-  abx broadened and consider thoracentesis -  has  been IS due to a couple of years being off and on steroids      Philip Trevino    Labs: Basic Metabolic Panel: Recent Labs  Lab 08/16/21 0256 08/17/21 0140 08/18/21 0157  NA 137 130* 133*  K 4.4 4.6 4.8  CL 100 102 104  CO2 20* 18* 19*  GLUCOSE 106* 102* 122*  BUN 76* 75* 75*  CREATININE 6.36* 5.91* 6.20*  CALCIUM 7.7* 7.0* 7.0*  PHOS 8.2* 9.1* 9.1*   Liver Function Tests: Recent Labs  Lab 08/14/21 0938 08/16/21 0256 08/17/21 0140 08/18/21 0157  AST 33  --   --   --   ALT 12  --   --   --   ALKPHOS 79  --   --   --   BILITOT 0.2*  --   --   --   PROT 5.9*  --   --   --   ALBUMIN <1.5* <1.5* <1.5* <1.5*   Recent Labs  Lab 08/14/21 0938  LIPASE 45   No results for input(s): AMMONIA in the last 168 hours. CBC: Recent Labs  Lab 08/14/21 0938 08/15/21 0925 08/16/21 0256 08/17/21 0140 08/18/21 0157  WBC 25.0* 26.7* 24.8* 24.8* 24.4*  NEUTROABS 21.1*  --  21.0*  --   --   HGB 8.7* 9.0* 8.7* 9.0* 8.2*  HCT 26.4* 27.9*  26.4* 26.4* 24.3*  MCV 88.6 88.9 87.4 87.1 87.1  PLT 1,082* 1,030* 908* 963* 788*   Cardiac Enzymes: No results for input(s): CKTOTAL, CKMB, CKMBINDEX, TROPONINI in the last 168 hours. CBG: No results for input(s): GLUCAP in the last 168 hours.  Iron Studies: No results for input(s): IRON, TIBC, TRANSFERRIN, FERRITIN in the last 72 hours. Studies/Results: CT CHEST WO CONTRAST  Result Date: 08/18/2021 CLINICAL DATA:  26 year old male with history of fever of unknown origin. EXAM: CT CHEST WITHOUT CONTRAST TECHNIQUE: Multidetector CT imaging of the chest was performed following the standard protocol without IV contrast. RADIATION DOSE REDUCTION: This exam was performed according to the departmental dose-optimization program which includes automated exposure control, adjustment of the mA and/or kV according to patient size and/or use of iterative reconstruction technique. COMPARISON:  No priors. FINDINGS: Cardiovascular: Heart size is normal. There is no significant pericardial fluid, thickening or pericardial calcification. No atherosclerotic calcifications are noted in the thoracic aorta or the coronary arteries. Diffuse low attenuation throughout the intravascular compartment, suggesting anemia. Mediastinum/Nodes: Multiple prominent borderline enlarged mediastinal and bilateral hilar lymph nodes are noted, nonspecific, but likely reactive. Esophagus is unremarkable in appearance. No axillary lymphadenopathy. Lungs/Pleura: Large left and moderate right pleural effusions. These are present predominantly posteriorly, however, these exerts substantial mass effect upon the underlying lung which is distorted, giving the appearance suggestive of probable loculation. This is associated with substantial passive atelectasis in the lower lobes of the lungs bilaterally, as well as the posterior aspect of the inferior segment of the lingula. Upper Abdomen: Unremarkable. Musculoskeletal: There are no aggressive  appearing lytic or blastic lesions noted in the visualized portions of the skeleton. IMPRESSION: 1. Large left and moderate right pleural effusions, which are likely partially loculated. Given the patient's history of fever, the possibility of empyema should be considered. These are associated with substantial passive atelectasis in the lung bases bilaterally. 2. Diffuse low attenuation throughout the intravascular compartment suggesting anemia. Electronically Signed   By: Vinnie Langton M.D.   On: 08/18/2021 08:32   Medications: Infusions:  ceFEPime (MAXIPIME) IV 2 g (08/18/21 0848)  Scheduled Medications:  atorvastatin  80 mg Oral Daily   azithromycin  500 mg Oral Daily   darbepoetin (ARANESP) injection - NON-DIALYSIS  100 mcg Subcutaneous Q Thu-1800   ezetimibe  10 mg Oral Daily   furosemide  80 mg Intravenous Q12H   heparin  5,000 Units Subcutaneous Q8H   pantoprazole  40 mg Oral Daily    have reviewed scheduled and prn medications.  Physical Exam: General: flat affect-  VERY FRUSTRATED Heart: RRR Lungs: dec BS at bases  Abdomen: soft, non tender Extremities: pitting edema     08/18/2021,10:30 AM  LOS: 4 days

## 2021-08-18 NOTE — Progress Notes (Signed)
Bilateral lower extremity venous duplex has been completed. Preliminary results can be found in CV Proc through chart review.   08/18/21 11:50 AM Philip Trevino RVT

## 2021-08-19 ENCOUNTER — Inpatient Hospital Stay (HOSPITAL_COMMUNITY): Payer: Medicaid Other

## 2021-08-19 DIAGNOSIS — N179 Acute kidney failure, unspecified: Secondary | ICD-10-CM | POA: Diagnosis not present

## 2021-08-19 DIAGNOSIS — N185 Chronic kidney disease, stage 5: Secondary | ICD-10-CM

## 2021-08-19 DIAGNOSIS — J869 Pyothorax without fistula: Secondary | ICD-10-CM | POA: Diagnosis not present

## 2021-08-19 LAB — RENAL FUNCTION PANEL
Albumin: <1.5 g/dL — ABNORMAL LOW (ref 3.5–5.0)
Anion gap: 10 (ref 5–15)
BUN: 70 mg/dL — ABNORMAL HIGH (ref 6–20)
CO2: 20 mmol/L — ABNORMAL LOW (ref 22–32)
Calcium: 7 mg/dL — ABNORMAL LOW (ref 8.9–10.3)
Chloride: 104 mmol/L (ref 98–111)
Creatinine, Ser: 5.79 mg/dL — ABNORMAL HIGH (ref 0.61–1.24)
GFR, Estimated: 13 mL/min — ABNORMAL LOW (ref >60)
Glucose, Bld: 114 mg/dL — ABNORMAL HIGH (ref 70–99)
Phosphorus: 9 mg/dL — ABNORMAL HIGH (ref 2.5–4.6)
Potassium: 4.8 mmol/L (ref 3.5–5.1)
Sodium: 134 mmol/L — ABNORMAL LOW (ref 135–145)

## 2021-08-19 LAB — CULTURE, BLOOD (ROUTINE X 2)
Culture: NO GROWTH
Culture: NO GROWTH
Special Requests: ADEQUATE

## 2021-08-19 LAB — CBC
HCT: 26.9 % — ABNORMAL LOW (ref 39.0–52.0)
Hemoglobin: 9.1 g/dL — ABNORMAL LOW (ref 13.0–17.0)
MCH: 29.4 pg (ref 26.0–34.0)
MCHC: 33.8 g/dL (ref 30.0–36.0)
MCV: 86.8 fL (ref 80.0–100.0)
Platelets: 871 10*3/uL — ABNORMAL HIGH (ref 150–400)
RBC: 3.1 MIL/uL — ABNORMAL LOW (ref 4.22–5.81)
RDW: 12.5 % (ref 11.5–15.5)
WBC: 18.1 10*3/uL — ABNORMAL HIGH (ref 4.0–10.5)
nRBC: 0 % (ref 0.0–0.2)

## 2021-08-19 LAB — PREPARE RBC (CROSSMATCH)

## 2021-08-19 LAB — ABO/RH: ABO/RH(D): A POS

## 2021-08-19 MED ORDER — SODIUM CHLORIDE 0.9% FLUSH
10.0000 mL | Freq: Three times a day (TID) | INTRAVENOUS | Status: DC
  Administered 2021-08-19 (×2): 10 mL

## 2021-08-19 MED ORDER — HYDROMORPHONE HCL 1 MG/ML IJ SOLN
1.0000 mg | INTRAMUSCULAR | Status: DC | PRN
  Administered 2021-08-19 (×2): 1 mg via INTRAVENOUS
  Filled 2021-08-19 (×3): qty 1

## 2021-08-19 MED ORDER — SODIUM CHLORIDE 0.9% FLUSH
10.0000 mL | Freq: Three times a day (TID) | INTRAVENOUS | Status: DC
  Administered 2021-08-19 – 2021-08-20 (×3): 10 mL

## 2021-08-19 MED ORDER — STERILE WATER FOR INJECTION IJ SOLN
5.0000 mg | Freq: Once | RESPIRATORY_TRACT | Status: AC
  Administered 2021-08-19: 5 mg via INTRAPLEURAL
  Filled 2021-08-19 (×2): qty 5

## 2021-08-19 MED ORDER — CEFAZOLIN SODIUM-DEXTROSE 2-4 GM/100ML-% IV SOLN
2.0000 g | INTRAVENOUS | Status: AC
  Administered 2021-08-20: 2 g via INTRAVENOUS
  Filled 2021-08-19 (×2): qty 100

## 2021-08-19 MED ORDER — HYDROMORPHONE HCL 1 MG/ML IJ SOLN
1.0000 mg | INTRAMUSCULAR | Status: AC | PRN
  Administered 2021-08-19: 1 mg via INTRAVENOUS
  Filled 2021-08-19: qty 1

## 2021-08-19 MED ORDER — STERILE WATER FOR INJECTION IJ SOLN
5.0000 mg | Freq: Once | RESPIRATORY_TRACT | Status: AC
  Administered 2021-08-19: 5 mg via INTRAPLEURAL
  Filled 2021-08-19: qty 5

## 2021-08-19 MED ORDER — SODIUM CHLORIDE (PF) 0.9 % IJ SOLN
10.0000 mg | Freq: Once | INTRAMUSCULAR | Status: AC
  Administered 2021-08-19: 10 mg via INTRAPLEURAL
  Filled 2021-08-19: qty 10

## 2021-08-19 MED ORDER — SODIUM CHLORIDE (PF) 0.9 % IJ SOLN
10.0000 mg | Freq: Once | INTRAMUSCULAR | Status: AC
  Administered 2021-08-19: 10 mg via INTRAPLEURAL
  Filled 2021-08-19 (×2): qty 10

## 2021-08-19 NOTE — Anesthesia Preprocedure Evaluation (Addendum)
Anesthesia Evaluation  Patient identified by MRN, date of birth, ID band Patient awake    Reviewed: Allergy & Precautions, NPO status , Patient's Chart, lab work & pertinent test results  Airway Mallampati: III  TM Distance: >3 FB Neck ROM: Full    Dental no notable dental hx.    Pulmonary Current Smoker and Patient abstained from smoking.,    Pulmonary exam normal breath sounds clear to auscultation       Cardiovascular hypertension, Pt. on medications  Rhythm:Regular Rate:Tachycardia     Neuro/Psych negative neurological ROS  negative psych ROS   GI/Hepatic negative GI ROS, Neg liver ROS,   Endo/Other  negative endocrine ROS  Renal/GU Renal disease     Musculoskeletal negative musculoskeletal ROS (+)   Abdominal   Peds  Hematology  (+) anemia ,   Anesthesia Other Findings left empyema  Reproductive/Obstetrics                            Anesthesia Physical Anesthesia Plan  ASA: 3  Anesthesia Plan: General   Post-op Pain Management:    Induction: Intravenous  PONV Risk Score and Plan: 1 and Ondansetron, Dexamethasone, Midazolam and Treatment may vary due to age or medical condition  Airway Management Planned: Double Lumen EBT  Additional Equipment:   Intra-op Plan:   Post-operative Plan: Extubation in OR  Informed Consent: I have reviewed the patients History and Physical, chart, labs and discussed the procedure including the risks, benefits and alternatives for the proposed anesthesia with the patient or authorized representative who has indicated his/her understanding and acceptance.     Dental advisory given  Plan Discussed with: CRNA  Anesthesia Plan Comments:        Anesthesia Quick Evaluation

## 2021-08-19 NOTE — Progress Notes (Signed)
RN received call from Micro lab that pt pleural fluid came back positive for growing Streptococcus Pneumoniae. Dr. Tyrell Antonio notified of findings.

## 2021-08-19 NOTE — Progress Notes (Signed)
Referring Physician(s): Dr Elsworth Soho  Supervising Physician: Michaelle Birks  Patient Status:  [facility] - In-pt  Chief Complaint:  Bilateral empyema Bilat chest tube drains placed in IR 1/28  Subjective:  Resting Groggy Drains are intact Left drain 900 cc in pleurvac: cloudy green/yellow fluid Right drain 300 cc in pleurvac: serous color  CXR Today:  IMPRESSION: Similar size of the small-moderate right pleural effusion with slightly decreased size of the moderate left pleural effusion and stable adjacent opacities which may reflect atelectasis or infection.    Allergies: Broccoli [brassica oleracea]  Medications: Prior to Admission medications   Medication Sig Start Date End Date Taking? Authorizing Provider  furosemide (LASIX) 80 MG tablet Take 1 tablet (80 mg total) by mouth 2 (two) times daily. Patient taking differently: Take 80 mg by mouth daily. 10/03/20 08/14/21 Yes Dessa Phi, DO  predniSONE (DELTASONE) 20 MG tablet Take 60 mg by mouth daily with breakfast. Continuously   Yes [provider]  Pseudoeph-CPM-DM-APAP (THERAFLU FLU/COLD/COUGH PO) Take 1 capsule by mouth daily as needed (For cold symptoms).   Yes [provider]  aspirin 81 MG chewable tablet Chew 1 tablet (81 mg total) by mouth daily. Patient not taking: Reported on 07/30/2021 10/03/20   Dessa Phi, DO  aspirin 81 MG chewable tablet CHEW 1 TABLET (81 MG TOTAL) BY MOUTH DAILY. Patient not taking: Reported on 07/30/2021 10/03/20 10/03/21  Dessa Phi, DO  atorvastatin (LIPITOR) 80 MG tablet Take 1 tablet (80 mg total) by mouth daily. Patient not taking: Reported on 07/30/2021 10/04/20   Dessa Phi, DO  atorvastatin (LIPITOR) 80 MG tablet TAKE 1 TABLET (80 MG TOTAL) BY MOUTH DAILY. Patient not taking: Reported on 07/30/2021 10/03/20 10/03/21  Dessa Phi, DO  ezetimibe (ZETIA) 10 MG tablet Take 1 tablet (10 mg total) by mouth daily. Patient not taking: Reported on 07/30/2021 10/04/20   Dessa Phi, DO  ezetimibe (ZETIA) 10 MG tablet TAKE 1 TABLET (10 MG TOTAL) BY MOUTH DAILY. Patient not taking: Reported on 07/30/2021 10/03/20 10/03/21  Dessa Phi, DO  furosemide (LASIX) 80 MG tablet TAKE 1 TABLET (80 MG TOTAL) BY MOUTH 2 (TWO) TIMES DAILY. Patient not taking: Reported on 07/30/2021 10/03/20 10/03/21  Dessa Phi, DO  lisinopril (ZESTRIL) 10 MG tablet Take 1 tablet (10 mg total) by mouth daily. Patient not taking: Reported on 07/30/2021 10/04/20   Dessa Phi, DO  lisinopril (ZESTRIL) 10 MG tablet TAKE 1 TABLET (10 MG TOTAL) BY MOUTH DAILY. Patient not taking: Reported on 07/30/2021 10/03/20 10/03/21  Dessa Phi, DO     Vital Signs: BP 127/79 (BP Location: Left Arm)    Pulse (!) 106    Temp 98.2 F (36.8 C) (Oral)    Resp 20    Ht 5\' 10"  (1.778 m)    Wt 173 lb 11.6 oz (78.8 kg)    SpO2 96%    BMI 24.93 kg/m   Physical Exam Vitals reviewed.  Skin:    General: Skin is warm.     Comments: Sites of drain are c.d/I Tender to touch Output Left larger than right No air leak B     Imaging: CT CHEST WO CONTRAST  Result Date: 08/18/2021 CLINICAL DATA:  26 year old male with history of fever of unknown origin. EXAM: CT CHEST WITHOUT CONTRAST TECHNIQUE: Multidetector CT imaging of the chest was performed following the standard protocol without IV contrast. RADIATION DOSE REDUCTION: This exam was performed according to the departmental dose-optimization program which includes automated exposure control, adjustment  of the mA and/or kV according to patient size and/or use of iterative reconstruction technique. COMPARISON:  No priors. FINDINGS: Cardiovascular: Heart size is normal. There is no significant pericardial fluid, thickening or pericardial calcification. No atherosclerotic calcifications are noted in the thoracic aorta or the coronary arteries. Diffuse low attenuation throughout the intravascular compartment, suggesting anemia. Mediastinum/Nodes: Multiple prominent borderline  enlarged mediastinal and bilateral hilar lymph nodes are noted, nonspecific, but likely reactive. Esophagus is unremarkable in appearance. No axillary lymphadenopathy. Lungs/Pleura: Large left and moderate right pleural effusions. These are present predominantly posteriorly, however, these exerts substantial mass effect upon the underlying lung which is distorted, giving the appearance suggestive of probable loculation. This is associated with substantial passive atelectasis in the lower lobes of the lungs bilaterally, as well as the posterior aspect of the inferior segment of the lingula. Upper Abdomen: Unremarkable. Musculoskeletal: There are no aggressive appearing lytic or blastic lesions noted in the visualized portions of the skeleton. IMPRESSION: 1. Large left and moderate right pleural effusions, which are likely partially loculated. Given the patient's history of fever, the possibility of empyema should be considered. These are associated with substantial passive atelectasis in the lung bases bilaterally. 2. Diffuse low attenuation throughout the intravascular compartment suggesting anemia. Electronically Signed   By: Vinnie Langton M.D.   On: 08/18/2021 08:32   DG Chest Port 1 View  Result Date: 08/19/2021 CLINICAL DATA:  Bilateral pleural effusions. EXAM: PORTABLE CHEST 1 VIEW COMPARISON:  Radiograph August 14, 2021 and CT August 18, 2021. FINDINGS: Pigtail thoracostomy is in the bilateral lung bases. Similar size of the small moderate right with slightly decreased size of the moderate left pleural effusions. Adjacent airspace opacities are similar. The heart size and mediastinal contours are unchanged. The visualized skeletal structures are stable. IMPRESSION: Similar size of the small-moderate right pleural effusion with slightly decreased size of the moderate left pleural effusion and stable adjacent opacities which may reflect atelectasis or infection. Electronically Signed   By: Dahlia Bailiff M.D.   On: 08/19/2021 08:17   CT [facility] PLEURAL DRAIN W/INDWELL CATH W/IMG GUIDE  Result Date: 08/18/2021 INDICATION: Bilateral loculated pleural effusions.  Question empyema. EXAM: CT PERC PLEURAL DRAIN W/INDWELL CATH W/IMG GUIDE Procedures: CT-GUIDED BILATERAL PIGTAIL THORACOSTOMY TUBE PLACEMENT RADIATION DOSE REDUCTION: This exam was performed according to the departmental dose-optimization program which includes automated exposure control, adjustment of the mA and/or kV according to patient size and/or use of iterative reconstruction technique. COMPARISON:  CT chest, earlier same day. MEDICATIONS: The patient is currently admitted to the hospital and receiving intravenous antibiotics. The antibiotics were administered within an appropriate time frame prior to the initiation of the procedure. ANESTHESIA/SEDATION: Local anesthetic and single agent sedation was employed during this procedure. A total of Versed 4 mg was administered intravenously. The patient's level of consciousness and vital signs were monitored continuously by radiology nursing throughout the procedure under my direct supervision. CONTRAST:  None COMPLICATIONS: None immediate. PROCEDURE: Informed written consent was obtained from the patient and/or patient's representative after a discussion of the risks, benefits and alternatives to treatment. The patient was placed supine on the CT gantry and a pre procedural CT was performed re-demonstrating the bilateral pleural collections. The procedure was planned. A timeout was performed prior to the initiation of the procedure. The RIGHT and LEFT chest was prepped and draped in the usual sterile fashion. The procedure began in the patient's LEFT side. The overlying soft tissues were anesthetized with 1% lidocaine with epinephrine. Appropriate trajectory  was planned with the use of a 22 gauge spinal needle. An 18 gauge trocar needle was advanced into the pleural collection and a short Amplatz  super stiff wire was coiled within the collection. Appropriate positioning was confirmed with a limited CT scan. The tract was serially dilated allowing placement of a 12 [place] all-purpose drainage catheter. Upon confirmation of the patient's stable vital signs, the procedure was repeated as above on the patient's RIGHT side. Following bilateral pleural drainage catheter placement, appropriate positioning was confirmed with a limited postprocedural CT scan. Approximally 10 mL of milky pleural fluid was aspirated from each drain and submitted for microbiological analysis. The tubes were connected to pleura vacs on negative suction and sutured in place. Dressings were placed. The patient tolerated the procedure well without immediate post procedural complication. IMPRESSION: Successful CT-guided placement of bilateral 12 Fr pleural drainage catheters into the posterior dependent portions of the loculated pleural collections. Samples were sent to the laboratory as requested by the ordering clinical team. Michaelle Birks, MD [facility] Electronically Signed   By: Michaelle Birks M.D.   On: 08/18/2021 19:49   CT [facility] PLEURAL DRAIN W/INDWELL CATH W/IMG GUIDE  Result Date: 08/18/2021 INDICATION: Bilateral loculated pleural effusions.  Question empyema. EXAM: CT PERC PLEURAL DRAIN W/INDWELL CATH W/IMG GUIDE Procedures: CT-GUIDED BILATERAL PIGTAIL THORACOSTOMY TUBE PLACEMENT RADIATION DOSE REDUCTION: This exam was performed according to the departmental dose-optimization program which includes automated exposure control, adjustment of the mA and/or kV according to patient size and/or use of iterative reconstruction technique. COMPARISON:  CT chest, earlier same day. MEDICATIONS: The patient is currently admitted to the hospital and receiving intravenous antibiotics. The antibiotics were administered within an appropriate time frame prior to the initiation of the  procedure. ANESTHESIA/SEDATION: Local anesthetic and single agent sedation was employed during this procedure. A total of Versed 4 mg was administered intravenously. The patient's level of consciousness and vital signs were monitored continuously by radiology nursing throughout the procedure under my direct supervision. CONTRAST:  None COMPLICATIONS: None immediate. PROCEDURE: Informed written consent was obtained from the patient and/or patient's representative after a discussion of the risks, benefits and alternatives to treatment. The patient was placed supine on the CT gantry and a pre procedural CT was performed re-demonstrating the bilateral pleural collections. The procedure was planned. A timeout was performed prior to the initiation of the procedure. The RIGHT and LEFT chest was prepped and draped in the usual sterile fashion. The procedure began in the patient's LEFT side. The overlying soft tissues were anesthetized with 1% lidocaine with epinephrine. Appropriate trajectory was planned with the use of a 22 gauge spinal needle. An 18 gauge trocar needle was advanced into the pleural collection and a short Amplatz super stiff wire was coiled within the collection. Appropriate positioning was confirmed with a limited CT scan. The tract was serially dilated allowing placement of a 12 [place] all-purpose drainage catheter. Upon confirmation of the patient's stable vital signs, the procedure was repeated as above on the patient's RIGHT side. Following bilateral pleural drainage catheter placement, appropriate positioning was confirmed with a limited postprocedural CT scan. Approximally 10 mL of milky pleural fluid was aspirated from each drain and submitted for microbiological analysis. The tubes were connected to pleura vacs on negative suction and sutured in place. Dressings were placed. The patient tolerated the procedure well without immediate post procedural complication. IMPRESSION: Successful CT-guided  placement of bilateral 12 Fr pleural drainage catheters into the posterior dependent  portions of the loculated pleural collections. Samples were sent to the laboratory as requested by the ordering clinical team. Michaelle Birks, MD [facility] Electronically Signed   By: Michaelle Birks M.D.   On: 08/18/2021 19:49   VAS [place] LOWER EXTREMITY VENOUS (DVT)  Result Date: 08/19/2021  Lower Venous DVT Study Patient Name:  Philip Trevino  Date of Exam:   08/18/2021 Medical Rec #: [id number]      Accession #:    [id number] Date of Birth: Jan 25, 1996       Patient Gender: M Patient Age:   64 years Exam Location:  [facility] Procedure:      VAS [place] LOWER EXTREMITY VENOUS (DVT) Referring Phys: Jerald Kief REGALADO --------------------------------------------------------------------------------  Indications: Swelling.  Risk Factors: None identified. Comparison Study: No prior studies. Performing Technologist: Oliver Hum RVT  Examination Guidelines: A complete evaluation includes B-mode imaging, spectral Doppler, color Doppler, and power Doppler as needed of all accessible portions of each vessel. Bilateral testing is considered an integral part of a complete examination. Limited examinations for reoccurring indications may be performed as noted. The reflux portion of the exam is performed with the patient in reverse Trendelenburg.  +---------+---------------+---------+-----------+----------+--------------+  RIGHT     Compressibility Phasicity Spontaneity Properties Thrombus Aging  +---------+---------------+---------+-----------+----------+--------------+  CFV       Full            Yes       Yes                                    +---------+---------------+---------+-----------+----------+--------------+  SFJ       Full                                                             +---------+---------------+---------+-----------+----------+--------------+  FV Prox   Full                                                              +---------+---------------+---------+-----------+----------+--------------+  FV Mid    Full                                                             +---------+---------------+---------+-----------+----------+--------------+  FV Distal Full                                                             +---------+---------------+---------+-----------+----------+--------------+  PFV       Full                                                             +---------+---------------+---------+-----------+----------+--------------+  POP       Full            Yes       Yes                                    +---------+---------------+---------+-----------+----------+--------------+  PTV       Full                                                             +---------+---------------+---------+-----------+----------+--------------+  PERO      Full                                                             +---------+---------------+---------+-----------+----------+--------------+   +---------+---------------+---------+-----------+----------+--------------+  LEFT      Compressibility Phasicity Spontaneity Properties Thrombus Aging  +---------+---------------+---------+-----------+----------+--------------+  CFV       Full            Yes       Yes                                    +---------+---------------+---------+-----------+----------+--------------+  SFJ       Full                                                             +---------+---------------+---------+-----------+----------+--------------+  FV Prox   Full                                                             +---------+---------------+---------+-----------+----------+--------------+  FV Mid    Full                                                             +---------+---------------+---------+-----------+----------+--------------+  FV Distal Full                                                              +---------+---------------+---------+-----------+----------+--------------+  PFV       Full                                                             +---------+---------------+---------+-----------+----------+--------------+  POP       Full            Yes       Yes                                    +---------+---------------+---------+-----------+----------+--------------+  PTV       Full                                                             +---------+---------------+---------+-----------+----------+--------------+  PERO      Full                                                             +---------+---------------+---------+-----------+----------+--------------+     Summary: RIGHT: - There is no evidence of deep vein thrombosis in the lower extremity.  - No cystic structure found in the popliteal fossa.  LEFT: - There is no evidence of deep vein thrombosis in the lower extremity.  - No cystic structure found in the popliteal fossa.  *See table(s) above for measurements and observations. Electronically signed by Servando Snare MD on 08/19/2021 at 9:29:33 AM.    Final     Labs:  CBC: Recent Labs    08/16/21 0256 08/17/21 0140 08/18/21 0157 08/19/21 0121  WBC 24.8* 24.8* 24.4* 18.1*  HGB 8.7* 9.0* 8.2* 9.1*  HCT 26.4* 26.4* 24.3* 26.9*  PLT 908* 963* 788* 871*    COAGS: Recent Labs    08/18/21 1639  INR 1.3*    BMP: Recent Labs    08/16/21 0256 08/17/21 0140 08/18/21 0157 08/19/21 0121  NA 137 130* 133* 134*  K 4.4 4.6 4.8 4.8  CL 100 102 104 104  CO2 20* 18* 19* 20*  GLUCOSE 106* 102* 122* 114*  BUN 76* 75* 75* 70*  CALCIUM 7.7* 7.0* 7.0* 7.0*  CREATININE 6.36* 5.91* 6.20* 5.79*  GFRNONAA 12* 13* 12* 13*    LIVER FUNCTION TESTS: Recent Labs    09/27/20 1330 09/28/20 0327 07/30/21 1717 08/14/21 0938 08/16/21 0256 08/17/21 0140 08/18/21 0157 08/19/21 0121  BILITOT 0.5  --  0.4 0.2*  --   --   --   --   AST 16  --  19 33  --   --   --   --   ALT 18   --  10 12  --   --   --   --   ALKPHOS 54  --  59 79  --   --   --   --   PROT 3.3*  --  3.8* 5.9*  --   --   --   --   ALBUMIN <1.0*   < > <1.5* <1.5* <1.5* <1.5* <1.5* <1.5*   < > = values in this interval not displayed.    Assessment and Plan:  Bilateral chest tube drains in place Will follow Plan per PCCM  Electronically Signed: Lavonia Drafts, PA-C 08/19/2021, 10:42 AM   I spent a total of 15 Minutes at the the  patient's bedside AND on the patient's hospital floor or unit, greater than 50% of which was counseling/coordinating care for Bilat CT drains

## 2021-08-19 NOTE — Progress Notes (Signed)
Patient seeming anxious and upset. RN asked if patient has support system who could visit with him and help him during this stay, patient declined.

## 2021-08-19 NOTE — Progress Notes (Signed)
Subjective: Patient examined,` radiologic images of the chest personally reviewed, patient discussed with Dr. Elsworth Soho for coordination of care.  26 year old smoker admitted with symptomatic bilateral empyema.  Pigtail catheter drainage removed milky purulent material from the left basilar collection positive for streptococcal pneumonia.  The right-sided pigtail catheter shows good resolution of the loculated effusion.  The patient is improved on IV antibiotics and white count is trending down.  He has chronic nephrotic syndrome with baseline creatinine of 6 followed by renal.  After 25 hours of drainage the left-sided effusion is improved but still with residual involvement.  This site has been treated with lytic therapy and the effects of this will be evaluated by a CT scan without contrast.  If significant residual material remains in the left chest after lytic therapy Dr. Elsworth Soho and I agree that a larger chest tubeneeds to be placed with surgical decortication due to the very purulent nature of the material.  The patient's cardiac status is stable and his last echocardiogram 18 months ago shows normal LV function.  Patient is on chronic prednisone therapy.  Blood cultures so far negative.  Objective: Vital signs in last 24 hours: Temp:  [98.2 F (36.8 C)-99.8 F (37.7 C)] 98.2 F (36.8 C) (01/29 1626) Pulse Rate:  [95-115] 106 (01/29 0250) Cardiac Rhythm: Normal sinus rhythm (01/29 0706) Resp:  [13-25] 20 (01/29 0250) BP: (122-133)/(77-94) 123/87 (01/29 1626) SpO2:  [95 %-98 %] 96 % (01/29 0250)  Hemodynamic parameters for last 24 hours:    Intake/Output from previous day: 01/28 0701 - 01/29 0700 In: -  Out: 2120 [Urine:925; Chest Tube:1195] Intake/Output this shift: Total I/O In: 406.6 [P.O.:355; IV Piggyback:51.6] Out: 52 [Urine:700; Chest Tube:110]       Exam    General- alert and comfortable but anxious about his drainage tubes    Neck- no JVD, no cervical adenopathy  palpable, no carotid bruit   Lungs-detail catheters in each pleural space.  Left drain with milky fluid.  Breath sounds slightly diminished on the left side.   Cor- regular rate and rhythm, no murmur , gallop   Abdomen- soft, non-tender   Extremities - warm, non-tender,  2+ ankle edema   Neuro- oriented, appropriate, no focal weakness   Lab Results: Recent Labs    08/18/21 0157 08/19/21 0121  WBC 24.4* 18.1*  HGB 8.2* 9.1*  HCT 24.3* 26.9*  PLT 788* 871*   BMET:  Recent Labs    08/18/21 0157 08/19/21 0121  NA 133* 134*  K 4.8 4.8  CL 104 104  CO2 19* 20*  GLUCOSE 122* 114*  BUN 75* 70*  CREATININE 6.20* 5.79*  CALCIUM 7.0* 7.0*    PT/INR:  Recent Labs    08/18/21 1639  LABPROT 16.1*  INR 1.3*   ABG No results found for: PHART, HCO3, TCO2, ACIDBASEDEF, O2SAT CBG (last 3)  No results for input(s): GLUCAP in the last 72 hours.  Assessment/Plan: S/P  Severe left-sided empyema growing streptococcal pneumonia and a young male with chronic nephrotic syndrome who is been immune suppressed with steroids in the past.  He has had a significant increase in his white count and creatinine as well as a anion gap.  He does not currently appear toxic with temperature 98.4 heart rate 92 and blood pressure 123/87.  Will obtain CT scan of the chest in a.m. to assess the effect of the lytic therapy and to determine the need for surgical drainage in the OR..  I discussed this procedure with the  patient he is in agreement although anxious.   LOS: 5 days    Dahlia Byes 08/19/2021

## 2021-08-19 NOTE — Progress Notes (Addendum)
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  He reports a cough minimally productive of clear sputum and bilateral chest pain when he would cough or take deep breaths.  Reports preceding URI symptoms and tested negative for COVID and flu x2, no sick contacts.  He tried marijuana to increase his appetite.  Initial labs showed BUN/creatinine of 76/6.4, baseline noted to be 2.2 about 3 weeks ago.  WBC count was 20 5K with hemoglobin of 8.7 Chest x-ray showed bibasal effusions  He was initially given IV fluids then developed fever, urine culture showed less than 10K organisms, started ceftriaxone and azithromycin to cover UTI and pneumonia.  HIV negative.  CT chest without contrast 1/28 showed large left and moderate right, partially loculated pleural effusions.  Mass effect on underlying lung with passive atelectasis Hence PCCM consulted  He states that he has not seen a nephrologist over the past year since he was "bouncing around"  Significant tests/ events reviewed  1/28 CT-guided bilateral chest tubes   PAST MEDICAL HISTORY :  Past Medical History:  Diagnosis Date   Nephrotic syndrome    Past Surgical History:  Procedure Laterality Date   IR FLUORO GUIDE CV LINE RIGHT  01/31/2020   IR US GUIDE VASC ACCESS RIGHT  01/31/2020   TONSILLECTOMY      SUBJECTIVE: Complains of pain Afebrile Left chest tube-900 cc, milky white/yellow fluid Right chest tube 300 cc, turning serous   VITAL SIGNS: Temp:  [98 F (36.7 C)-99.8 F (37.7 C)] 98.2 F (36.8 C) (01/29 0700) Pulse Rate:  [94-115] 106 (01/29 0250) Resp:   [13-25] 20 (01/29 0250) BP: (113-140)/(77-106) 127/79 (01/29 0250) SpO2:  [95 %-100 %] 96 % (01/29 0250)  PHYSICAL EXAMINATION: Gen. Pleasant, well-nourished, in no distress, anxious affect ENT - no lesions, no post nasal drip Neck: No JVD, no thyromegaly, no carotid bruits Lungs: No accessory muscle use, decreased breath sounds bilateral, Cardiovascular: Rhythm regular, heart sounds  normal, no murmurs, no peripheral edema Abdomen: soft and non-tender, no hepatosplenomegaly, BS normal. Musculoskeletal: No deformities, no cyanosis or clubbing Neuro: Alert, nonfocal Skin:  Warm, no lesions/ rash  Labs show decreasing WBC count and creatinine , stable anemia   Recent Labs  Lab 08/17/21 0140 08/18/21 0157 08/19/21 0121  NA 130* 133* 134*  K 4.6 4.8 4.8  CL 102 104 104  CO2 18* 19* 20*  BUN 75* 75* 70*  CREATININE 5.91* 6.20* 5.79*  GLUCOSE 102* 122* 114*    Recent Labs  Lab 08/17/21 0140 08/18/21 0157 08/19/21 0121  HGB 9.0* 8.2* 9.1*  HCT 26.4* 24.3* 26.9*  WBC 24.8* 24.4* 18.1*  PLT 963* 788* 871*    CT CHEST WO CONTRAST  Result Date: 08/18/2021 CLINICAL DATA:  26 year old male with history of fever of unknown origin. EXAM: CT CHEST WITHOUT CONTRAST TECHNIQUE: Multidetector CT imaging of the chest was performed following the standard protocol without IV contrast. RADIATION DOSE REDUCTION: This exam was performed according to the departmental dose-optimization program which includes automated exposure control, adjustment of the mA and/or kV according to patient size and/or use of iterative reconstruction technique. COMPARISON:  No priors. FINDINGS: Cardiovascular:  Heart size is normal. There is no significant pericardial fluid, thickening or pericardial calcification. No atherosclerotic calcifications are noted in the thoracic aorta or the coronary arteries. Diffuse low attenuation throughout the intravascular compartment, suggesting anemia. Mediastinum/Nodes: Multiple  prominent borderline enlarged mediastinal and bilateral hilar lymph nodes are noted, nonspecific, but likely reactive. Esophagus is unremarkable in appearance. No axillary lymphadenopathy. Lungs/Pleura: Large left and moderate right pleural effusions. These are present predominantly posteriorly, however, these exerts substantial mass effect upon the underlying lung which is distorted, giving the appearance suggestive of probable loculation. This is associated with substantial passive atelectasis in the lower lobes of the lungs bilaterally, as well as the posterior aspect of the inferior segment of the lingula. Upper Abdomen: Unremarkable. Musculoskeletal: There are no aggressive appearing lytic or blastic lesions noted in the visualized portions of the skeleton. IMPRESSION: 1. Large left and moderate right pleural effusions, which are likely partially loculated. Given the patient's history of fever, the possibility of empyema should be considered. These are associated with substantial passive atelectasis in the lung bases bilaterally. 2. Diffuse low attenuation throughout the intravascular compartment suggesting anemia. Electronically Signed   By: Vinnie Langton M.D.   On: 08/18/2021 08:32   DG Chest Port 1 View  Result Date: 08/19/2021 CLINICAL DATA:  Bilateral pleural effusions. EXAM: PORTABLE CHEST 1 VIEW COMPARISON:  Radiograph August 14, 2021 and CT August 18, 2021. FINDINGS: Pigtail thoracostomy is in the bilateral lung bases. Similar size of the small moderate right with slightly decreased size of the moderate left pleural effusions. Adjacent airspace opacities are similar. The heart size and mediastinal contours are unchanged. The visualized skeletal structures are stable. IMPRESSION: Similar size of the small-moderate right pleural effusion with slightly decreased size of the moderate left pleural effusion and stable adjacent opacities which may reflect atelectasis or infection. Electronically  Signed   By: Dahlia Bailiff M.D.   On: 08/19/2021 08:17   CT [facility] PLEURAL DRAIN W/INDWELL CATH W/IMG GUIDE  Result Date: 08/18/2021 INDICATION: Bilateral loculated pleural effusions.  Question empyema. EXAM: CT PERC PLEURAL DRAIN W/INDWELL CATH W/IMG GUIDE Procedures: CT-GUIDED BILATERAL PIGTAIL THORACOSTOMY TUBE PLACEMENT RADIATION DOSE REDUCTION: This exam was performed according to the departmental dose-optimization program which includes automated exposure control, adjustment of the mA and/or kV according to patient size and/or use of iterative reconstruction technique. COMPARISON:  CT chest, earlier same day. MEDICATIONS: The patient is currently admitted to the hospital and receiving intravenous antibiotics. The antibiotics were administered within an appropriate time frame prior to the initiation of the procedure. ANESTHESIA/SEDATION: Local anesthetic and single agent sedation was employed during this procedure. A total of Versed 4 mg was administered intravenously. The patient's level of consciousness and vital signs were monitored continuously by radiology nursing throughout the procedure under my direct supervision. CONTRAST:  None COMPLICATIONS: None immediate. PROCEDURE: Informed written consent was obtained from the patient and/or patient's representative after a discussion of the risks, benefits and alternatives to treatment. The patient was placed supine on the CT gantry and a pre procedural CT was performed re-demonstrating the bilateral pleural collections. The procedure was planned. A timeout was performed prior to the initiation of the procedure. The RIGHT and LEFT chest was prepped and draped in the usual sterile fashion. The procedure began in the patient's LEFT side. The overlying soft tissues were anesthetized with 1% lidocaine with epinephrine. Appropriate trajectory was planned with the use of a 22 gauge spinal needle. An 18 gauge trocar needle was advanced into the pleural  collection  and a short Amplatz super stiff wire was coiled within the collection. Appropriate positioning was confirmed with a limited CT scan. The tract was serially dilated allowing placement of a 12 [place] all-purpose drainage catheter. Upon confirmation of the patient's stable vital signs, the procedure was repeated as above on the patient's RIGHT side. Following bilateral pleural drainage catheter placement, appropriate positioning was confirmed with a limited postprocedural CT scan. Approximally 10 mL of milky pleural fluid was aspirated from each drain and submitted for microbiological analysis. The tubes were connected to pleura vacs on negative suction and sutured in place. Dressings were placed. The patient tolerated the procedure well without immediate post procedural complication. IMPRESSION: Successful CT-guided placement of bilateral 12 Fr pleural drainage catheters into the posterior dependent portions of the loculated pleural collections. Samples were sent to the laboratory as requested by the ordering clinical team. Michaelle Birks, MD [facility] Electronically Signed   By: Michaelle Birks M.D.   On: 08/18/2021 19:49   CT [facility] PLEURAL DRAIN W/INDWELL CATH W/IMG GUIDE  Result Date: 08/18/2021 INDICATION: Bilateral loculated pleural effusions.  Question empyema. EXAM: CT PERC PLEURAL DRAIN W/INDWELL CATH W/IMG GUIDE Procedures: CT-GUIDED BILATERAL PIGTAIL THORACOSTOMY TUBE PLACEMENT RADIATION DOSE REDUCTION: This exam was performed according to the departmental dose-optimization program which includes automated exposure control, adjustment of the mA and/or kV according to patient size and/or use of iterative reconstruction technique. COMPARISON:  CT chest, earlier same day. MEDICATIONS: The patient is currently admitted to the hospital and receiving intravenous antibiotics. The antibiotics were administered within an appropriate time frame prior to the  initiation of the procedure. ANESTHESIA/SEDATION: Local anesthetic and single agent sedation was employed during this procedure. A total of Versed 4 mg was administered intravenously. The patient's level of consciousness and vital signs were monitored continuously by radiology nursing throughout the procedure under my direct supervision. CONTRAST:  None COMPLICATIONS: None immediate. PROCEDURE: Informed written consent was obtained from the patient and/or patient's representative after a discussion of the risks, benefits and alternatives to treatment. The patient was placed supine on the CT gantry and a pre procedural CT was performed re-demonstrating the bilateral pleural collections. The procedure was planned. A timeout was performed prior to the initiation of the procedure. The RIGHT and LEFT chest was prepped and draped in the usual sterile fashion. The procedure began in the patient's LEFT side. The overlying soft tissues were anesthetized with 1% lidocaine with epinephrine. Appropriate trajectory was planned with the use of a 22 gauge spinal needle. An 18 gauge trocar needle was advanced into the pleural collection and a short Amplatz super stiff wire was coiled within the collection. Appropriate positioning was confirmed with a limited CT scan. The tract was serially dilated allowing placement of a 12 [place] all-purpose drainage catheter. Upon confirmation of the patient's stable vital signs, the procedure was repeated as above on the patient's RIGHT side. Following bilateral pleural drainage catheter placement, appropriate positioning was confirmed with a limited postprocedural CT scan. Approximally 10 mL of milky pleural fluid was aspirated from each drain and submitted for microbiological analysis. The tubes were connected to pleura vacs on negative suction and sutured in place. Dressings were placed. The patient tolerated the procedure well without immediate post procedural complication. IMPRESSION:  Successful CT-guided placement of bilateral 12 Fr pleural drainage catheters into the posterior dependent portions of the loculated pleural collections. Samples were sent to the laboratory as requested by the ordering clinical team. Michaelle Birks,  MD [facility]iology Electronically Signed   By: Michaelle Birks M.D.   On: 08/18/2021 19:49   VAS [place] LOWER EXTREMITY VENOUS (DVT)  Result Date: 08/19/2021  Lower Venous DVT Study Patient Name:  Philip Trevino  Date of Exam:   08/18/2021 Medical Rec #: [id number]      Accession #:    [id number] Date of Birth: 1995/12/22       Patient Gender: M Patient Age:   56 years Exam Location:  [facility] Procedure:      VAS [place] LOWER EXTREMITY VENOUS (DVT) Referring Phys: Jerald Kief REGALADO --------------------------------------------------------------------------------  Indications: Swelling.  Risk Factors: None identified. Comparison Study: No prior studies. Performing Technologist: Oliver Hum RVT  Examination Guidelines: A complete evaluation includes B-mode imaging, spectral Doppler, color Doppler, and power Doppler as needed of all accessible portions of each vessel. Bilateral testing is considered an integral part of a complete examination. Limited examinations for reoccurring indications may be performed as noted. The reflux portion of the exam is performed with the patient in reverse Trendelenburg.  +---------+---------------+---------+-----------+----------+--------------+  RIGHT     Compressibility Phasicity Spontaneity Properties Thrombus Aging  +---------+---------------+---------+-----------+----------+--------------+  CFV       Full            Yes       Yes                                    +---------+---------------+---------+-----------+----------+--------------+  SFJ       Full                                                              +---------+---------------+---------+-----------+----------+--------------+  FV Prox   Full                                                             +---------+---------------+---------+-----------+----------+--------------+  FV Mid    Full                                                             +---------+---------------+---------+-----------+----------+--------------+  FV Distal Full                                                             +---------+---------------+---------+-----------+----------+--------------+  PFV       Full                                                             +---------+---------------+---------+-----------+----------+--------------+  POP       Full            Yes       Yes                                    +---------+---------------+---------+-----------+----------+--------------+  PTV       Full                                                             +---------+---------------+---------+-----------+----------+--------------+  PERO      Full                                                             +---------+---------------+---------+-----------+----------+--------------+   +---------+---------------+---------+-----------+----------+--------------+  LEFT      Compressibility Phasicity Spontaneity Properties Thrombus Aging  +---------+---------------+---------+-----------+----------+--------------+  CFV       Full            Yes       Yes                                    +---------+---------------+---------+-----------+----------+--------------+  SFJ       Full                                                             +---------+---------------+---------+-----------+----------+--------------+  FV Prox   Full                                                             +---------+---------------+---------+-----------+----------+--------------+  FV Mid    Full                                                              +---------+---------------+---------+-----------+----------+--------------+  FV Distal Full                                                             +---------+---------------+---------+-----------+----------+--------------+  PFV       Full                                                             +---------+---------------+---------+-----------+----------+--------------+  POP       Full            Yes       Yes                                    +---------+---------------+---------+-----------+----------+--------------+  PTV       Full                                                             +---------+---------------+---------+-----------+----------+--------------+  PERO      Full                                                             +---------+---------------+---------+-----------+----------+--------------+     Summary: RIGHT: - There is no evidence of deep vein thrombosis in the lower extremity.  - No cystic structure found in the popliteal fossa.  LEFT: - There is no evidence of deep vein thrombosis in the lower extremity.  - No cystic structure found in the popliteal fossa.  *See table(s) above for measurements and observations. Electronically signed by Servando Snare MD on 08/19/2021 at 9:29:33 AM.    Final     ASSESSMENT / PLAN:  Bilateral empyema -Pleural fluid results reviewed, differential includes chylothorax but low glucose and high neutrophils favors empyema, await pleural fluid triglycerides He had fever and leukocytosis and is immunocompromised with a history of FSGS/nephrotic syndrome  -Status post bilateral CT-guided chest tubes Reviewed chest x-ray shows decreased effusion on both sides   -Continue Zosyn , cultures growing pneumococcus but will need anaerobic coverage too -Discussed with T CTS, concerned that empyema on left may not fully respond to lytics but will try first rather than surgery -will place lytics sequentially bilateral, first on left and then on right if  he tolerates well  AKI/FSGS -Per renal  Kara Mead MD. FCCP. [facility] Pager [phone] If no response call [phone]    08/19/2021, 11:24 AM

## 2021-08-19 NOTE — Plan of Care (Signed)
?  Problem: Clinical Measurements: ?Goal: Ability to maintain clinical measurements within normal limits will improve ?Outcome: Progressing ?Goal: Will remain free from infection ?Outcome: Progressing ?Goal: Diagnostic test results will improve ?Outcome: Progressing ?  ?

## 2021-08-19 NOTE — Progress Notes (Signed)
Pt's close friend Darlyne Russian spoke with RN and she will be here in the morning for support and was wanting an update from the doctors tomorrow.

## 2021-08-19 NOTE — Progress Notes (Signed)
PROGRESS NOTE    Demon Volante  ZOX:[id number] DOB: 03-26-1996 DOA: 08/14/2021 PCP: Kerin Perna, NP   Brief Narrative: 26 year old past medical history significant for focal segmental glomerulosclerosis, minimal-change disease, hypertension presents with multiple complaints.  Over the last 2 or 3 weeks he noticed loss of taste, fatigue, weakness.  Pleuritic chest pain.  Chest congestion.  Subjective fever.  Multiple episodes of diarrhea.  He was supposed to follow-up with nephrology, but due to transportation issue never follow-up.  Not taking any home medications.  Evaluation in the ED: COVID-negative, BUN 76, creatinine 6.4 albumin less than 1.5, total protein 5.9.  Chest x-ray with bibasilar chest density left greater than the right.  Finding suggestive of pleural effusion compressive atelectasis.    Patient spike fever, WBC continue to be elevated. CT chest showed BL pleural effusion/ loculated. CCM consulted for thoracentesis. CCM performed bedside US which showed bilateral loculated effusion without clear window. Recommendation was for IR chest tube placement.   Assessment & Plan:   Principal Problem:   AKI (acute kidney injury) ([place]) Active Problems:   Anemia, unspecified   Benign essential HTN   Leukocytosis   Hyperlipidemia   Pleural effusion   1-AKI in the setting of FSG: Hyponatremia; in setting renal failure -Renal biopsy-proven FSGS with frequent flares but usually associated with medication noncompliance -Normal creatinine October November, 2 weeks ago creatinine 2.2 presenting with a creatinine over 6. -AKI also could be related to ATN from low blood pressure, ace.  Also treated for UTI. -Appreciate nephrology assistance. -Nephrology considering further alternative agent to treat  FSG. Considering Rituxan.  -Cr: 6.0--6.3---5.9--6.2-5.7 -Started on IV  lasix.  Renal function stable today.   Bilateral Empyema:  Patient with Leukocytosis, Fever, BL loculated  pleural effusion.  -Chest x ray with atelectasis vs consolidation. -Spike fever 1/27. CT chest 1/28 showed worsening BL pleural effusion left worse than right, loculated, consider empyema.  -CCM consulted, no clear window for safe thoracentesis, concern for empyema. -IR consulted for Chest tube placement. Underwent BL chest tube placement 1/28.  -Pleural fluid consistent with empyema, awaiting triglycerides level.  -Pleural fluid growing streptococcus. Continue with Zosyn to cover  anaerobes.  - Zosyn -Plan for Lytics, if not improvement might need CVTS.  -WBC trending down.   Hyperlipidemia:  Continue with Zetia and Lipitor  Hypertension: started on Lasix. Hold lisinopril due to AKI.   Anemia thrombocytosis: On Aranesp.   Thrombocytosis, leukocytosis; related to infection.   Hypoalbuminemia -Suspect related to hypoalbuminemia, in setting of FSG.  Fever; doppler negative   Elevation of troponin: Likely demand in the setting of renal failure  Diarrhea:  Reported 2 episode of loose stool since yesterday Resolved.   Failure to thrive: Likely in the setting of AKI And infection.  HIV nonreactive     Estimated body mass index is 24.93 kg/m as calculated from the following:   Height as of this encounter: 5\' 10"  (1.778 m).   Weight as of this encounter: 78.8 kg.   DVT prophylaxis: SCDs, Heparin  Code Status: Full code Family Communication: Care discussed with patient Disposition Plan:  Status is: Inpatient  Remains inpatient appropriate because: Management for acute renal failure    Consultants:  Nephrology  Procedures:    Antimicrobials:    Subjective: He is feeling a little better, can breath better.  Having chest pain at insertion chest tube.  900 cc out put form chest tube on the right and 300 cc from left   Objective: Vitals:  08/18/21 2307 08/19/21 0000 08/19/21 0250 08/19/21 0700  BP: (!) 131/91 133/82 127/79   Pulse: (!) 108 (!) 115 (!) 106    Resp: (!) 25 (!) 21 20   Temp: 99.8 F (37.7 C)  98.4 F (36.9 C) 98.2 F (36.8 C)  TempSrc: Oral  Oral Oral  SpO2: 95% 96% 96%   Weight:      Height:        Intake/Output Summary (Last 24 hours) at 08/19/2021 0856 Last data filed at 08/19/2021 0800 Gross per 24 hour  Intake --  Output 2120 ml  Net -2120 ml    Filed Weights   08/15/21 1400  Weight: 78.8 kg    Examination:  General exam: NAD Respiratory system: BL crackles.  Cardiovascular system: S 1, S 2 RRR Gastrointestinal system: BS present, soft, nt Central nervous system: Non focal.  Extremities: No edema   Data Reviewed: I have personally reviewed following labs and imaging studies  CBC: Recent Labs  Lab 08/14/21 0938 08/15/21 0925 08/16/21 0256 08/17/21 0140 08/18/21 0157 08/19/21 0121  WBC 25.0* 26.7* 24.8* 24.8* 24.4* 18.1*  NEUTROABS 21.1*  --  21.0*  --   --   --   HGB 8.7* 9.0* 8.7* 9.0* 8.2* 9.1*  HCT 26.4* 27.9* 26.4* 26.4* 24.3* 26.9*  MCV 88.6 88.9 87.4 87.1 87.1 86.8  PLT 1,082* 1,030* 908* 963* 788* 871*    Basic Metabolic Panel: Recent Labs  Lab 08/14/21 0938 08/15/21 0925 08/16/21 0256 08/17/21 0140 08/18/21 0157 08/19/21 0121  NA 133* 131* 137 130* 133* 134*  K 3.9 3.8 4.4 4.6 4.8 4.8  CL 102 104 100 102 104 104  CO2 21* 20* 20* 18* 19* 20*  GLUCOSE 100* 111* 106* 102* 122* 114*  BUN 76* 77* 76* 75* 75* 70*  CREATININE 6.41* 6.08* 6.36* 5.91* 6.20* 5.79*  CALCIUM 7.1* 6.7* 7.7* 7.0* 7.0* 7.0*  MG 2.7*  --   --   --   --   --   PHOS  --   --  8.2* 9.1* 9.1* 9.0*    GFR: Estimated Creatinine Clearance: 20.3 mL/min (A) (by C-G formula based on SCr of 5.79 mg/dL (H)). Liver Function Tests: Recent Labs  Lab 08/14/21 0938 08/16/21 0256 08/17/21 0140 08/18/21 0157 08/19/21 0121  AST 33  --   --   --   --   ALT 12  --   --   --   --   ALKPHOS 79  --   --   --   --   BILITOT 0.2*  --   --   --   --   PROT 5.9*  --   --   --   --   ALBUMIN <1.5* <1.5* <1.5* <1.5*  <1.5*    Recent Labs  Lab 08/14/21 0938  LIPASE 45    No results for input(s): AMMONIA in the last 168 hours. Coagulation Profile: Recent Labs  Lab 08/18/21 1639  INR 1.3*   Cardiac Enzymes: No results for input(s): CKTOTAL, CKMB, CKMBINDEX, TROPONINI in the last 168 hours. BNP (last 3 results) No results for input(s): PROBNP in the last 8760 hours. HbA1C: No results for input(s): HGBA1C in the last 72 hours. CBG: No results for input(s): GLUCAP in the last 168 hours. Lipid Profile: No results for input(s): CHOL, HDL, LDLCALC, TRIG, CHOLHDL, LDLDIRECT in the last 72 hours.  Thyroid Function Tests: No results for input(s): TSH, T4TOTAL, FREET4, T3FREE, THYROIDAB in the last 72 hours. Anemia  Panel: No results for input(s): VITAMINB12, FOLATE, FERRITIN, TIBC, IRON, RETICCTPCT in the last 72 hours. Sepsis Labs: No results for input(s): PROCALCITON, LATICACIDVEN in the last 168 hours.  Recent Results (from the past 240 hour(s))  Resp Panel by RT-PCR (Flu A&B, Covid) Nasopharyngeal Swab     Status: None   Collection Time: 08/14/21  4:26 PM   Specimen: Nasopharyngeal Swab; Nasopharyngeal(NP) swabs in vial transport medium  Result Value Ref Range Status   SARS Coronavirus 2 by RT PCR NEGATIVE NEGATIVE Final    Comment: (NOTE) SARS-CoV-2 target nucleic acids are NOT DETECTED.  The SARS-CoV-2 RNA is generally detectable in upper respiratory specimens during the acute phase of infection. The lowest concentration of SARS-CoV-2 viral copies this assay can detect is 138 copies/mL. A negative result does not preclude SARS-Cov-2 infection and should not be used as the sole basis for treatment or other patient management decisions. A negative result may occur with  improper specimen collection/handling, submission of specimen other than nasopharyngeal swab, presence of viral mutation(s) within the areas targeted by this assay, and inadequate number of viral copies(<138 copies/mL).  A negative result must be combined with clinical observations, patient history, and epidemiological information. The expected result is Negative.  Fact Sheet for Patients:  EntrepreneurPulse.com.au  Fact Sheet for Healthcare Providers:  IncredibleEmployment.be  This test is no t yet approved or cleared by the [place] FDA and  has been authorized for detection and/or diagnosis of SARS-CoV-2 by FDA under an Emergency Use Authorization (EUA). This EUA will remain  in effect (meaning this test can be used) for the duration of the COVID-19 declaration under Section 564(b)(1) of the Act, 21 U.S.C.section 360bbb-3(b)(1), unless the authorization is terminated  or revoked sooner.       Influenza A by PCR NEGATIVE NEGATIVE Final   Influenza B by PCR NEGATIVE NEGATIVE Final    Comment: (NOTE) The Xpert Xpress SARS-CoV-2/FLU/RSV plus assay is intended as an aid in the diagnosis of influenza from Nasopharyngeal swab specimens and should not be used as a sole basis for treatment. Nasal washings and aspirates are unacceptable for Xpert Xpress SARS-CoV-2/FLU/RSV testing.  Fact Sheet for Patients: EntrepreneurPulse.com.au  Fact Sheet for Healthcare Providers: IncredibleEmployment.be  This test is not yet approved or cleared by the [place] FDA and has been authorized for detection and/or diagnosis of SARS-CoV-2 by FDA under an Emergency Use Authorization (EUA). This EUA will remain in effect (meaning this test can be used) for the duration of the COVID-19 declaration under Section 564(b)(1) of the Act, 21 U.S.C. section 360bbb-3(b)(1), unless the authorization is terminated or revoked.  Performed at [facility], [place] [address] [place], [place] [address]   Culture, blood (routine x 2)     Status: None   Collection Time: 08/14/21  9:32 PM   Specimen: BLOOD RIGHT HAND  Result Value Ref Range Status    Specimen Description BLOOD RIGHT HAND  Final   Special Requests   Final    BOTTLES DRAWN AEROBIC AND ANAEROBIC Blood Culture results may not be optimal due to an inadequate volume of blood received in culture bottles   Culture   Final    NO GROWTH 5 DAYS Performed at [facility], [place] [address], [place],  [address]    Report Status 08/19/2021 FINAL  Final  Culture, blood (routine x 2)     Status: None   Collection Time: 08/14/21  9:33 PM   Specimen: BLOOD  Result Value  Ref Range Status   Specimen Description BLOOD LEFT ANTECUBITAL  Final   Special Requests   Final    BOTTLES DRAWN AEROBIC AND ANAEROBIC Blood Culture adequate volume   Culture   Final    NO GROWTH 5 DAYS Performed at [facility], [address], [place], [place] [address]    Report Status 08/19/2021 FINAL  Final  Urine Culture     Status: Abnormal   Collection Time: 08/16/21  9:38 AM   Specimen: Urine, Clean Catch  Result Value Ref Range Status   Specimen Description URINE, CLEAN CATCH  Final   Special Requests NONE  Final   Culture (A)  Final    <10,000 COLONIES/mL INSIGNIFICANT GROWTH Performed at [facility], [place] [address] [place], [place] [address]    Report Status 08/17/2021 FINAL  Final  Culture, blood (routine x 2)     Status: None (Preliminary result)   Collection Time: 08/18/21  9:13 AM   Specimen: BLOOD  Result Value Ref Range Status   Specimen Description BLOOD LEFT ANTECUBITAL  Final   Special Requests   Final    BOTTLES DRAWN AEROBIC AND ANAEROBIC Blood Culture adequate volume   Culture   Final    NO GROWTH < 24 HOURS Performed at [facility], [place] [address], [place], [place] [address]    Report Status PENDING  Incomplete  Culture, blood (routine x 2)     Status: None (Preliminary result)   Collection Time: 08/18/21  9:25 AM   Specimen: BLOOD LEFT HAND  Result Value Ref Range Status   Specimen Description BLOOD LEFT HAND  Final   Special Requests    Final    BOTTLES DRAWN AEROBIC AND ANAEROBIC Blood Culture adequate volume   Culture   Final    NO GROWTH < 24 HOURS Performed at [facility], [place] [address], [place], [place] [address]    Report Status PENDING  Incomplete  Aerobic/Anaerobic Culture w Gram Stain (surgical/deep wound)     Status: None (Preliminary result)   Collection Time: 08/18/21  2:57 PM   Specimen: Pleural Fluid  Result Value Ref Range Status   Specimen Description PLEURAL FLUID  Final   Special Requests RIGHT CHEST  Final   Gram Stain   Final    ABUNDANT WBC PRESENT,BOTH PMN AND MONONUCLEAR NO ORGANISMS SEEN Performed at [facility], [place] [address] [place], [place] [address]    Culture PENDING  Incomplete   Report Status PENDING  Incomplete  Aerobic/Anaerobic Culture w Gram Stain (surgical/deep wound)     Status: None (Preliminary result)   Collection Time: 08/18/21  2:58 PM   Specimen: Pleural Fluid  Result Value Ref Range Status   Specimen Description PLEURAL FLUID  Final   Special Requests LEFT CHEST  Final   Gram Stain   Final    ABUNDANT WBC PRESENT,BOTH PMN AND MONONUCLEAR NO ORGANISMS SEEN Performed at [facility], [address] [place],  [address]    Culture PENDING  Incomplete   Report Status PENDING  Incomplete          Radiology Studies: CT CHEST WO CONTRAST  Result Date: 08/18/2021 CLINICAL DATA:  26 year old male with history of fever of unknown origin. EXAM: CT CHEST WITHOUT CONTRAST TECHNIQUE: Multidetector CT imaging of the chest was performed following the standard protocol without IV contrast. RADIATION DOSE REDUCTION: This exam was performed according to the departmental dose-optimization program which includes  automated exposure control, adjustment of the mA and/or kV according to patient size and/or use of iterative reconstruction technique. COMPARISON:  No priors. FINDINGS: Cardiovascular: Heart size is normal. There is no significant pericardial  fluid, thickening or pericardial calcification. No atherosclerotic calcifications are noted in the thoracic aorta or the coronary arteries. Diffuse low attenuation throughout the intravascular compartment, suggesting anemia. Mediastinum/Nodes: Multiple prominent borderline enlarged mediastinal and bilateral hilar lymph nodes are noted, nonspecific, but likely reactive. Esophagus is unremarkable in appearance. No axillary lymphadenopathy. Lungs/Pleura: Large left and moderate right pleural effusions. These are present predominantly posteriorly, however, these exerts substantial mass effect upon the underlying lung which is distorted, giving the appearance suggestive of probable loculation. This is associated with substantial passive atelectasis in the lower lobes of the lungs bilaterally, as well as the posterior aspect of the inferior segment of the lingula. Upper Abdomen: Unremarkable. Musculoskeletal: There are no aggressive appearing lytic or blastic lesions noted in the visualized portions of the skeleton. IMPRESSION: 1. Large left and moderate right pleural effusions, which are likely partially loculated. Given the patient's history of fever, the possibility of empyema should be considered. These are associated with substantial passive atelectasis in the lung bases bilaterally. 2. Diffuse low attenuation throughout the intravascular compartment suggesting anemia. Electronically Signed   By: Vinnie Langton M.D.   On: 08/18/2021 08:32   DG Chest Port 1 View  Result Date: 08/19/2021 CLINICAL DATA:  Bilateral pleural effusions. EXAM: PORTABLE CHEST 1 VIEW COMPARISON:  Radiograph August 14, 2021 and CT August 18, 2021. FINDINGS: Pigtail thoracostomy is in the bilateral lung bases. Similar size of the small moderate right with slightly decreased size of the moderate left pleural effusions. Adjacent airspace opacities are similar. The heart size and mediastinal contours are unchanged. The visualized skeletal  structures are stable. IMPRESSION: Similar size of the small-moderate right pleural effusion with slightly decreased size of the moderate left pleural effusion and stable adjacent opacities which may reflect atelectasis or infection. Electronically Signed   By: Dahlia Bailiff M.D.   On: 08/19/2021 08:17   CT [facility] PLEURAL DRAIN W/INDWELL CATH W/IMG GUIDE  Result Date: 08/18/2021 INDICATION: Bilateral loculated pleural effusions.  Question empyema. EXAM: CT PERC PLEURAL DRAIN W/INDWELL CATH W/IMG GUIDE Procedures: CT-GUIDED BILATERAL PIGTAIL THORACOSTOMY TUBE PLACEMENT RADIATION DOSE REDUCTION: This exam was performed according to the departmental dose-optimization program which includes automated exposure control, adjustment of the mA and/or kV according to patient size and/or use of iterative reconstruction technique. COMPARISON:  CT chest, earlier same day. MEDICATIONS: The patient is currently admitted to the hospital and receiving intravenous antibiotics. The antibiotics were administered within an appropriate time frame prior to the initiation of the procedure. ANESTHESIA/SEDATION: Local anesthetic and single agent sedation was employed during this procedure. A total of Versed 4 mg was administered intravenously. The patient's level of consciousness and vital signs were monitored continuously by radiology nursing throughout the procedure under my direct supervision. CONTRAST:  None COMPLICATIONS: None immediate. PROCEDURE: Informed written consent was obtained from the patient and/or patient's representative after a discussion of the risks, benefits and alternatives to treatment. The patient was placed supine on the CT gantry and a pre procedural CT was performed re-demonstrating the bilateral pleural collections. The procedure was planned. A timeout was performed prior to the initiation of the procedure. The RIGHT and LEFT chest was prepped and draped in the usual sterile fashion. The procedure began in the  patient's LEFT side. The overlying soft tissues were anesthetized with 1%  lidocaine with epinephrine. Appropriate trajectory was planned with the use of a 22 gauge spinal needle. An 18 gauge trocar needle was advanced into the pleural collection and a short Amplatz super stiff wire was coiled within the collection. Appropriate positioning was confirmed with a limited CT scan. The tract was serially dilated allowing placement of a 12 [place] all-purpose drainage catheter. Upon confirmation of the patient's stable vital signs, the procedure was repeated as above on the patient's RIGHT side. Following bilateral pleural drainage catheter placement, appropriate positioning was confirmed with a limited postprocedural CT scan. Approximally 10 mL of milky pleural fluid was aspirated from each drain and submitted for microbiological analysis. The tubes were connected to pleura vacs on negative suction and sutured in place. Dressings were placed. The patient tolerated the procedure well without immediate post procedural complication. IMPRESSION: Successful CT-guided placement of bilateral 12 Fr pleural drainage catheters into the posterior dependent portions of the loculated pleural collections. Samples were sent to the laboratory as requested by the ordering clinical team. Michaelle Birks, MD [facility] Radiology Electronically Signed   By: Michaelle Birks M.D.   On: 08/18/2021 19:49   CT [facility] PLEURAL DRAIN W/INDWELL CATH W/IMG GUIDE  Result Date: 08/18/2021 INDICATION: Bilateral loculated pleural effusions.  Question empyema. EXAM: CT PERC PLEURAL DRAIN W/INDWELL CATH W/IMG GUIDE Procedures: CT-GUIDED BILATERAL PIGTAIL THORACOSTOMY TUBE PLACEMENT RADIATION DOSE REDUCTION: This exam was performed according to the departmental dose-optimization program which includes automated exposure control, adjustment of the mA and/or kV according to patient size and/or use of iterative  reconstruction technique. COMPARISON:  CT chest, earlier same day. MEDICATIONS: The patient is currently admitted to the hospital and receiving intravenous antibiotics. The antibiotics were administered within an appropriate time frame prior to the initiation of the procedure. ANESTHESIA/SEDATION: Local anesthetic and single agent sedation was employed during this procedure. A total of Versed 4 mg was administered intravenously. The patient's level of consciousness and vital signs were monitored continuously by radiology nursing throughout the procedure under my direct supervision. CONTRAST:  None COMPLICATIONS: None immediate. PROCEDURE: Informed written consent was obtained from the patient and/or patient's representative after a discussion of the risks, benefits and alternatives to treatment. The patient was placed supine on the CT gantry and a pre procedural CT was performed re-demonstrating the bilateral pleural collections. The procedure was planned. A timeout was performed prior to the initiation of the procedure. The RIGHT and LEFT chest was prepped and draped in the usual sterile fashion. The procedure began in the patient's LEFT side. The overlying soft tissues were anesthetized with 1% lidocaine with epinephrine. Appropriate trajectory was planned with the use of a 22 gauge spinal needle. An 18 gauge trocar needle was advanced into the pleural collection and a short Amplatz super stiff wire was coiled within the collection. Appropriate positioning was confirmed with a limited CT scan. The tract was serially dilated allowing placement of a 12 [place] all-purpose drainage catheter. Upon confirmation of the patient's stable vital signs, the procedure was repeated as above on the patient's RIGHT side. Following bilateral pleural drainage catheter placement, appropriate positioning was confirmed with a limited postprocedural CT scan. Approximally 10 mL of milky pleural fluid was aspirated from each drain and  submitted for microbiological analysis. The tubes were connected to pleura vacs on negative suction and sutured in place. Dressings were placed. The patient tolerated the procedure well without immediate post procedural complication. IMPRESSION: Successful CT-guided placement of bilateral 12 Fr pleural drainage catheters  into the posterior dependent portions of the loculated pleural collections. Samples were sent to the laboratory as requested by the ordering clinical team. Michaelle Birks, MD [facility] Radiology Electronically Signed   By: Michaelle Birks M.D.   On: 08/18/2021 19:49   VAS [place] LOWER EXTREMITY VENOUS (DVT)  Result Date: 08/18/2021  Lower Venous DVT Study Patient Name:  JAQUANE BOUGHNER  Date of Exam:   08/18/2021 Medical Rec #: [id number]      Accession #:    [id number] Date of Birth: [birth year]       Patient Gender: M Patient Age:   50 years Exam [facility]:  [facility] Procedure:      VAS [place] LOWER EXTREMITY VENOUS (DVT) Referring Phys: Jerald Kief Gilman Olazabal --------------------------------------------------------------------------------  Indications: Swelling.  Risk Factors: None identified. Comparison Study: No prior studies. Performing Technologist: Oliver Hum RVT  Examination Guidelines: A complete evaluation includes B-mode imaging, spectral Doppler, color Doppler, and power Doppler as needed of all accessible portions of each vessel. Bilateral testing is considered an integral part of a complete examination. Limited examinations for reoccurring indications may be performed as noted. The reflux portion of the exam is performed with the patient in reverse Trendelenburg.  +---------+---------------+---------+-----------+----------+--------------+  RIGHT     Compressibility Phasicity Spontaneity Properties Thrombus Aging  +---------+---------------+---------+-----------+----------+--------------+  CFV       Full            Yes       Yes                                     +---------+---------------+---------+-----------+----------+--------------+  SFJ       Full                                                             +---------+---------------+---------+-----------+----------+--------------+  FV Prox   Full                                                             +---------+---------------+---------+-----------+----------+--------------+  FV Mid    Full                                                             +---------+---------------+---------+-----------+----------+--------------+  FV Distal Full                                                             +---------+---------------+---------+-----------+----------+--------------+  PFV       Full                                                             +---------+---------------+---------+-----------+----------+--------------+  POP       Full            Yes       Yes                                    +---------+---------------+---------+-----------+----------+--------------+  PTV       Full                                                             +---------+---------------+---------+-----------+----------+--------------+  PERO      Full                                                             +---------+---------------+---------+-----------+----------+--------------+   +---------+---------------+---------+-----------+----------+--------------+  LEFT      Compressibility Phasicity Spontaneity Properties Thrombus Aging  +---------+---------------+---------+-----------+----------+--------------+  CFV       Full            Yes       Yes                                    +---------+---------------+---------+-----------+----------+--------------+  SFJ       Full                                                             +---------+---------------+---------+-----------+----------+--------------+  FV Prox   Full                                                              +---------+---------------+---------+-----------+----------+--------------+  FV Mid    Full                                                             +---------+---------------+---------+-----------+----------+--------------+  FV Distal Full                                                             +---------+---------------+---------+-----------+----------+--------------+  PFV       Full                                                             +---------+---------------+---------+-----------+----------+--------------+  POP       Full            Yes       Yes                                    +---------+---------------+---------+-----------+----------+--------------+  PTV       Full                                                             +---------+---------------+---------+-----------+----------+--------------+  PERO      Full                                                             +---------+---------------+---------+-----------+----------+--------------+     Summary: RIGHT: - There is no evidence of deep vein thrombosis in the lower extremity.  - No cystic structure found in the popliteal fossa.  LEFT: - There is no evidence of deep vein thrombosis in the lower extremity.  - No cystic structure found in the popliteal fossa.  *See table(s) above for measurements and observations.    Preliminary         Scheduled Meds:  atorvastatin  80 mg Oral Daily   azithromycin  500 mg Oral Daily   darbepoetin (ARANESP) injection - NON-DIALYSIS  100 mcg Subcutaneous Q Thu-1800   ezetimibe  10 mg Oral Daily   furosemide  80 mg Intravenous Q6H   heparin  5,000 Units Subcutaneous Q8H   pantoprazole  40 mg Oral Daily   Continuous Infusions:  piperacillin-tazobactam (ZOSYN)  IV 3.375 g (08/19/21 0817)     LOS: 5 days    Time spent: 35 minutes.     Elmarie Shiley, MD [facility]   If 7PM-7AM, please contact night-coverage [email or web address]  08/19/2021, 8:56 AM

## 2021-08-19 NOTE — Procedures (Signed)
Pleural Fibrinolytic Administration Procedure Note  Philip Trevino  [id number]  01-Dec-1995  Date:08/19/21  Time:3:29 PM   Provider Performing:Pete E Kary Kos   Procedure: Pleural Fibrinolysis Subsequent day [phone]) Right side  Indication(s) Fibrinolysis of complicated pleural effusion  Consent Risks of the procedure as well as the alternatives and risks of each were explained to the patient and/or caregiver.  Consent for the procedure was obtained.   Anesthesia None   Time Out Verified patient identification, verified procedure, site/side was marked, verified correct patient position, special equipment/implants available, medications/allergies/relevant history reviewed, required imaging and test results available.   Sterile Technique Hand hygiene, gloves   Procedure Description Existing pleural catheter was cleaned and accessed in sterile manner.  10mg  of tPA in 30cc of saline and 5mg  of dornase in 30cc of sterile water were injected into pleural space using existing pleural catheter.  Catheter will be clamped for 1 hour and then placed back to suction.   Complications/Tolerance None; patient tolerated the procedure well.   EBL None   Specimen(s) None

## 2021-08-19 NOTE — Procedures (Signed)
Pleural Fibrinolytic Administration Procedure Note   Left side  Philip Trevino  [id number]  09/18/1995  Date:08/19/21  Time:12:14 PM   Provider Performing:Vaneza Pickart D. Harris   Procedure: Pleural Fibrinolysis Initial day (75102)  Indication(s) Fibrinolysis of complicated pleural effusion  Consent Risks of the procedure as well as the alternatives and risks of each were explained to the patient and/or caregiver.  Consent for the procedure was obtained.   Anesthesia None   Time Out Verified patient identification, verified procedure, site/side was marked, verified correct patient position, special equipment/implants available, medications/allergies/relevant history reviewed, required imaging and test results available.   Sterile Technique Hand hygiene, gloves   Procedure Description Existing pleural catheter was cleaned and accessed in sterile manner.  10mg  of tPA in 30cc of saline and 5mg  of dornase in 30cc of sterile water were injected into pleural space using existing pleural catheter.  Catheter will be clamped for 1 hour and then placed back to suction.   Complications/Tolerance None; patient tolerated the procedure well.  EBL None   Specimen(s) None   Gargi Berch D. Kenton Kingfisher, NP-C [facility] Personal contact information can be found on Amion  08/19/2021, 12:14 PM

## 2021-08-19 NOTE — Progress Notes (Signed)
Subjective:   Events of note-  had bilat pigtail chest tubes placed with purulent return-  moved to [place]-  WBC and fever better-  made at least 925 of urine and crt down some   Objective Vital signs in last 24 hours: Vitals:   08/18/21 2000 08/18/21 2307 08/19/21 0000 08/19/21 0250  BP: 125/77 (!) 131/91 133/82 127/79  Pulse: 98 (!) 108 (!) 115 (!) 106  Resp: 17 (!) 25 (!) 21 20  Temp:  99.8 F (37.7 C)  98.4 F (36.9 C)  TempSrc:  Oral  Oral  SpO2: 96% 95% 96% 96%  Weight:      Height:       Weight change:   Intake/Output Summary (Last 24 hours) at 08/19/2021 0734 Last data filed at 08/19/2021 0550 Gross per 24 hour  Intake --  Output 2070 ml  Net -2070 ml     Assessment/Plan: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  In October/November crt was normal-  and 17 days ago crt was 2.2-  now over 6.  Ultrasound does not show obstruction-  urine studies appear nephrotic but with microscopic hematuria as well.  Big change in renal function in last 17 days would be unusual to see because of FSG.  possibly due to ATN from low BP and ACE or due to NSAIDS , UTI, now thinking due to this evolving pulmonary infectrion-   holding ACE and managing infections- continuing to reassess function-  it is slow to improve but did see improvement today  Patient will need an alternative agent to treat his FSG in the future clearly is failing this current situation-  hesitate to use progaf with an elevated crt -  do not  want to give him steroids again with his long term exposure and current infection,  thinking rituxan.  He sounds excited about the prospect of using something different -  I would like to see his acute situation resolve before doing that. Difficult to tell how much of AKI is due to  kidney disease as opposed to something else-  In retrospect think was due to this evolving pulmonary situation-    2. Hypertension/volume  - has at least 1+ edema but he seems to think that it is better-  volume status worsening with IVF in past and crt not that much better-  have added on diuretics -  alb low so is third spacing -   UOP is better-   cont diuretics today  3.  Appetite sxms-  I guess could be due to uremia although usually 43 year olds can tolerate BUNs of 70's without sxms -  or could it be due to gerd from prednisone -  he says is better  4. Anemia  - indicates long term CKD vs GIB possibly due to long term steroids -  have put him on protonix -  and give dose of ESA -  stable  5.  Elevated WBC-  fever. Leukopenia and pyuria-  have discussed with hospitalist-  had been treating for UTI-    wbc still up and worse fever so now thinking possible empyema-  abx broadened and now bilat pigtail chest tubes for bilat empyema -  has been IS due to a couple of years being off and on steroids  Louis Meckel    Labs: Basic Metabolic Panel: Recent Labs  Lab 08/17/21 0140 08/18/21 0157 08/19/21 0121  NA 130* 133* 134*  K 4.6 4.8 4.8  CL 102 104 104  CO2 18* 19* 20*  GLUCOSE 102* 122* 114*  BUN 75* 75* 70*  CREATININE 5.91* 6.20* 5.79*  CALCIUM 7.0* 7.0* 7.0*  PHOS 9.1* 9.1* 9.0*   Liver Function Tests: Recent Labs  Lab 08/14/21 0938 08/16/21 0256 08/17/21 0140 08/18/21 0157 08/19/21 0121  AST 33  --   --   --   --   ALT 12  --   --   --   --   ALKPHOS 79  --   --   --   --   BILITOT 0.2*  --   --   --   --   PROT 5.9*  --   --   --   --   ALBUMIN <1.5*   < > <1.5* <1.5* <1.5*   < > = values in this interval not displayed.   Recent Labs  Lab 08/14/21 0938  LIPASE 45   No results for input(s): AMMONIA in the last 168 hours. CBC: Recent Labs  Lab 08/14/21 0938 08/15/21 0925 08/16/21 0256 08/17/21 0140 08/18/21 0157 08/19/21 0121  WBC 25.0*  26.7* 24.8* 24.8* 24.4* 18.1*  NEUTROABS 21.1*  --  21.0*  --   --   --   HGB 8.7* 9.0* 8.7* 9.0* 8.2* 9.1*  HCT 26.4* 27.9* 26.4* 26.4* 24.3* 26.9*  MCV 88.6 88.9 87.4 87.1 87.1 86.8  PLT 1,082* 1,030* 908* 963* 788* 871*   Cardiac Enzymes: No results for input(s): CKTOTAL, CKMB, CKMBINDEX, TROPONINI in the last 168 hours. CBG: No results for input(s): GLUCAP in the last 168 hours.  Iron Studies: No results for input(s): IRON, TIBC, TRANSFERRIN, FERRITIN in the last 72 hours. Studies/Results: CT CHEST WO CONTRAST  Result Date: 08/18/2021 CLINICAL DATA:  26 year old male with history of fever of unknown origin. EXAM: CT CHEST WITHOUT CONTRAST TECHNIQUE: Multidetector CT imaging of the chest was performed following the standard protocol without IV contrast. RADIATION DOSE REDUCTION: This exam was performed according to the departmental dose-optimization program which includes automated exposure control, adjustment of the mA and/or kV according to patient size and/or use of iterative reconstruction technique. COMPARISON:  No priors. FINDINGS: Cardiovascular: Heart size is normal. There is no significant pericardial fluid, thickening or pericardial calcification. No atherosclerotic calcifications are noted in the thoracic aorta or the coronary arteries. Diffuse low attenuation throughout the intravascular compartment, suggesting anemia. Mediastinum/Nodes: Multiple prominent borderline enlarged mediastinal and bilateral hilar lymph nodes are noted, nonspecific, but likely reactive. Esophagus is unremarkable in appearance. No axillary lymphadenopathy. Lungs/Pleura: Large left and moderate right pleural effusions. These are present predominantly posteriorly, however, these exerts substantial mass effect upon the underlying lung which is distorted, giving the appearance suggestive of probable loculation. This is associated with substantial passive atelectasis in the lower lobes of the lungs bilaterally, as  well as the posterior aspect of the inferior segment of the lingula. Upper Abdomen: Unremarkable. Musculoskeletal: There are no aggressive appearing lytic or blastic lesions noted in the visualized portions of the skeleton. IMPRESSION: 1. Large left and moderate right pleural effusions, which are likely partially loculated. Given the patient's history of fever, the possibility of empyema should be considered. These are associated with substantial passive atelectasis in the lung bases bilaterally. 2. Diffuse low attenuation throughout the intravascular compartment suggesting anemia. Electronically Signed  By: Vinnie Langton M.D.   On: 08/18/2021 08:32   CT [facility] PLEURAL DRAIN W/INDWELL CATH W/IMG GUIDE  Result Date: 08/18/2021 INDICATION: Bilateral loculated pleural effusions.  Question empyema. EXAM: CT PERC PLEURAL DRAIN W/INDWELL CATH W/IMG GUIDE Procedures: CT-GUIDED BILATERAL PIGTAIL THORACOSTOMY TUBE PLACEMENT RADIATION DOSE REDUCTION: This exam was performed according to the departmental dose-optimization program which includes automated exposure control, adjustment of the mA and/or kV according to patient size and/or use of iterative reconstruction technique. COMPARISON:  CT chest, earlier same day. MEDICATIONS: The patient is currently admitted to the hospital and receiving intravenous antibiotics. The antibiotics were administered within an appropriate time frame prior to the initiation of the procedure. ANESTHESIA/SEDATION: Local anesthetic and single agent sedation was employed during this procedure. A total of Versed 4 mg was administered intravenously. The patient's level of consciousness and vital signs were monitored continuously by radiology nursing throughout the procedure under my direct supervision. CONTRAST:  None COMPLICATIONS: None immediate. PROCEDURE: Informed written consent was obtained from the patient and/or patient's representative after a discussion of the risks, benefits and  alternatives to treatment. The patient was placed supine on the CT gantry and a pre procedural CT was performed re-demonstrating the bilateral pleural collections. The procedure was planned. A timeout was performed prior to the initiation of the procedure. The RIGHT and LEFT chest was prepped and draped in the usual sterile fashion. The procedure began in the patient's LEFT side. The overlying soft tissues were anesthetized with 1% lidocaine with epinephrine. Appropriate trajectory was planned with the use of a 22 gauge spinal needle. An 18 gauge trocar needle was advanced into the pleural collection and a short Amplatz super stiff wire was coiled within the collection. Appropriate positioning was confirmed with a limited CT scan. The tract was serially dilated allowing placement of a 12 [place] all-purpose drainage catheter. Upon confirmation of the patient's stable vital signs, the procedure was repeated as above on the patient's RIGHT side. Following bilateral pleural drainage catheter placement, appropriate positioning was confirmed with a limited postprocedural CT scan. Approximally 10 mL of milky pleural fluid was aspirated from each drain and submitted for microbiological analysis. The tubes were connected to pleura vacs on negative suction and sutured in place. Dressings were placed. The patient tolerated the procedure well without immediate post procedural complication. IMPRESSION: Successful CT-guided placement of bilateral 12 Fr pleural drainage catheters into the posterior dependent portions of the loculated pleural collections. Samples were sent to the laboratory as requested by the ordering clinical team. Michaelle Birks, MD [facility] Electronically Signed   By: Michaelle Birks M.D.   On: 08/18/2021 19:49   CT [facility] PLEURAL DRAIN W/INDWELL CATH W/IMG GUIDE  Result Date: 08/18/2021 INDICATION: Bilateral loculated pleural effusions.  Question  empyema. EXAM: CT PERC PLEURAL DRAIN W/INDWELL CATH W/IMG GUIDE Procedures: CT-GUIDED BILATERAL PIGTAIL THORACOSTOMY TUBE PLACEMENT RADIATION DOSE REDUCTION: This exam was performed according to the departmental dose-optimization program which includes automated exposure control, adjustment of the mA and/or kV according to patient size and/or use of iterative reconstruction technique. COMPARISON:  CT chest, earlier same day. MEDICATIONS: The patient is currently admitted to the hospital and receiving intravenous antibiotics. The antibiotics were administered within an appropriate time frame prior to the initiation of the procedure. ANESTHESIA/SEDATION: Local anesthetic and single agent sedation was employed during this procedure. A total of Versed 4 mg was administered intravenously. The patient's level of consciousness and vital signs were monitored continuously by radiology nursing  throughout the procedure under my direct supervision. CONTRAST:  None COMPLICATIONS: None immediate. PROCEDURE: Informed written consent was obtained from the patient and/or patient's representative after a discussion of the risks, benefits and alternatives to treatment. The patient was placed supine on the CT gantry and a pre procedural CT was performed re-demonstrating the bilateral pleural collections. The procedure was planned. A timeout was performed prior to the initiation of the procedure. The RIGHT and LEFT chest was prepped and draped in the usual sterile fashion. The procedure began in the patient's LEFT side. The overlying soft tissues were anesthetized with 1% lidocaine with epinephrine. Appropriate trajectory was planned with the use of a 22 gauge spinal needle. An 18 gauge trocar needle was advanced into the pleural collection and a short Amplatz super stiff wire was coiled within the collection. Appropriate positioning was confirmed with a limited CT scan. The tract was serially dilated allowing placement of a 12 [place]  all-purpose drainage catheter. Upon confirmation of the patient's stable vital signs, the procedure was repeated as above on the patient's RIGHT side. Following bilateral pleural drainage catheter placement, appropriate positioning was confirmed with a limited postprocedural CT scan. Approximally 10 mL of milky pleural fluid was aspirated from each drain and submitted for microbiological analysis. The tubes were connected to pleura vacs on negative suction and sutured in place. Dressings were placed. The patient tolerated the procedure well without immediate post procedural complication. IMPRESSION: Successful CT-guided placement of bilateral 12 Fr pleural drainage catheters into the posterior dependent portions of the loculated pleural collections. Samples were sent to the laboratory as requested by the ordering clinical team. Michaelle Birks, MD [facility] Radiology Electronically Signed   By: Michaelle Birks M.D.   On: 08/18/2021 19:49   VAS [place] LOWER EXTREMITY VENOUS (DVT)  Result Date: 08/18/2021  Lower Venous DVT Study Patient Name:  KAEGAN HETTICH  Date of Exam:   08/18/2021 Medical Rec #: [id number]      Accession #:    [id number] Date of Birth: November 06, 1995       Patient Gender: M Patient Age:   26 years Exam Location:  [facility] Procedure:      VAS [place] LOWER EXTREMITY VENOUS (DVT) Referring Phys: Jerald Kief REGALADO --------------------------------------------------------------------------------  Indications: Swelling.  Risk Factors: None identified. Comparison Study: No prior studies. Performing Technologist: Oliver Hum RVT  Examination Guidelines: A complete evaluation includes B-mode imaging, spectral Doppler, color Doppler, and power Doppler as needed of all accessible portions of each vessel. Bilateral testing is considered an integral part of a complete examination. Limited examinations for reoccurring indications may be performed as noted. The reflux  portion of the exam is performed with the patient in reverse Trendelenburg.  +---------+---------------+---------+-----------+----------+--------------+  RIGHT     Compressibility Phasicity Spontaneity Properties Thrombus Aging  +---------+---------------+---------+-----------+----------+--------------+  CFV       Full            Yes       Yes                                    +---------+---------------+---------+-----------+----------+--------------+  SFJ       Full                                                             +---------+---------------+---------+-----------+----------+--------------+  FV Prox   Full                                                             +---------+---------------+---------+-----------+----------+--------------+  FV Mid    Full                                                             +---------+---------------+---------+-----------+----------+--------------+  FV Distal Full                                                             +---------+---------------+---------+-----------+----------+--------------+  PFV       Full                                                             +---------+---------------+---------+-----------+----------+--------------+  POP       Full            Yes       Yes                                    +---------+---------------+---------+-----------+----------+--------------+  PTV       Full                                                             +---------+---------------+---------+-----------+----------+--------------+  PERO      Full                                                             +---------+---------------+---------+-----------+----------+--------------+   +---------+---------------+---------+-----------+----------+--------------+  LEFT      Compressibility Phasicity Spontaneity Properties Thrombus Aging  +---------+---------------+---------+-----------+----------+--------------+  CFV       Full            Yes       Yes                                     +---------+---------------+---------+-----------+----------+--------------+  SFJ       Full                                                             +---------+---------------+---------+-----------+----------+--------------+  FV Prox   Full                                                             +---------+---------------+---------+-----------+----------+--------------+  FV Mid    Full                                                             +---------+---------------+---------+-----------+----------+--------------+  FV Distal Full                                                             +---------+---------------+---------+-----------+----------+--------------+  PFV       Full                                                             +---------+---------------+---------+-----------+----------+--------------+  POP       Full            Yes       Yes                                    +---------+---------------+---------+-----------+----------+--------------+  PTV       Full                                                             +---------+---------------+---------+-----------+----------+--------------+  PERO      Full                                                             +---------+---------------+---------+-----------+----------+--------------+     Summary: RIGHT: - There is no evidence of deep vein thrombosis in the lower extremity.  - No cystic structure found in the popliteal fossa.  LEFT: - There is no evidence of deep vein thrombosis in the lower extremity.  - No cystic structure found in the popliteal fossa.  *See table(s) above for measurements and observations.    Preliminary    Medications: Infusions:  piperacillin-tazobactam (ZOSYN)  IV 3.375 g (08/18/21 2238)     Scheduled Medications:  atorvastatin  80 mg Oral Daily   azithromycin  500 mg Oral Daily   darbepoetin (ARANESP) injection - NON-DIALYSIS  100 mcg Subcutaneous Q Thu-1800    ezetimibe  10 mg Oral Daily   furosemide  80  mg Intravenous Q6H   heparin  5,000 Units Subcutaneous Q8H   pantoprazole  40 mg Oral Daily    have reviewed scheduled and prn medications.  Physical Exam: General: flat affect-  feeling a little better that diagnosis has been made but uncomfortable with CTs Heart: RRR Lungs: dec BS at bases -   bilat CT-  1100 of drainage ! Abdomen: soft, non tender Extremities: pitting edema     08/19/2021,7:34 AM  LOS: 5 days

## 2021-08-20 ENCOUNTER — Inpatient Hospital Stay (HOSPITAL_COMMUNITY): Payer: Medicaid Other

## 2021-08-20 ENCOUNTER — Encounter (HOSPITAL_COMMUNITY)
Admission: EM | Disposition: A | Discharge status: Home / Self Care | Payer: Self-pay | Source: Home / Self Care | Attending: Internal Medicine

## 2021-08-20 ENCOUNTER — Encounter (HOSPITAL_COMMUNITY): Payer: Self-pay | Admitting: Internal Medicine

## 2021-08-20 ENCOUNTER — Inpatient Hospital Stay (HOSPITAL_COMMUNITY): Payer: Medicaid Other | Admitting: Anesthesiology

## 2021-08-20 ENCOUNTER — Other Ambulatory Visit: Payer: Self-pay

## 2021-08-20 DIAGNOSIS — J869 Pyothorax without fistula: Secondary | ICD-10-CM

## 2021-08-20 DIAGNOSIS — A491 Streptococcal infection, unspecified site: Secondary | ICD-10-CM | POA: Diagnosis not present

## 2021-08-20 DIAGNOSIS — J9601 Acute respiratory failure with hypoxia: Secondary | ICD-10-CM | POA: Diagnosis not present

## 2021-08-20 DIAGNOSIS — N179 Acute kidney failure, unspecified: Secondary | ICD-10-CM | POA: Diagnosis not present

## 2021-08-20 HISTORY — PX: VIDEO ASSISTED THORACOSCOPY (VATS)/EMPYEMA: SHX6172

## 2021-08-20 LAB — SURGICAL PCR SCREEN
MRSA, PCR: NEGATIVE
Staphylococcus aureus: NEGATIVE

## 2021-08-20 LAB — CBC
HCT: 25.8 % — ABNORMAL LOW (ref 39.0–52.0)
Hemoglobin: 8.3 g/dL — ABNORMAL LOW (ref 13.0–17.0)
MCH: 28.2 pg (ref 26.0–34.0)
MCHC: 32.2 g/dL (ref 30.0–36.0)
MCV: 87.8 fL (ref 80.0–100.0)
Platelets: 860 10*3/uL — ABNORMAL HIGH (ref 150–400)
RBC: 2.94 MIL/uL — ABNORMAL LOW (ref 4.22–5.81)
RDW: 12.4 % (ref 11.5–15.5)
WBC: 13.3 10*3/uL — ABNORMAL HIGH (ref 4.0–10.5)
nRBC: 0 % (ref 0.0–0.2)

## 2021-08-20 LAB — BODY FLUID CELL COUNT WITH DIFFERENTIAL
Eos, Fluid: 0 %
Eos, Fluid: 0 %
Eos, Fluid: 0 %
Lymphs, Fluid: 7 %
Lymphs, Fluid: 7 %
Lymphs, Fluid: 3 %
Monocyte-Macrophage-Serous Fluid: 2 % — ABNORMAL LOW (ref 50–90)
Monocyte-Macrophage-Serous Fluid: 5 % — ABNORMAL LOW (ref 50–90)
Monocyte-Macrophage-Serous Fluid: 5 % — ABNORMAL LOW (ref 50–90)
Neutrophil Count, Fluid: 88 % — ABNORMAL HIGH (ref 0–25)
Neutrophil Count, Fluid: 91 % — ABNORMAL HIGH (ref 0–25)
Neutrophil Count, Fluid: 92 % — ABNORMAL HIGH (ref 0–25)
Total Nucleated Cell Count, Fluid: 159075 cu mm — ABNORMAL HIGH (ref 0–1000)
Total Nucleated Cell Count, Fluid: 782750 cu mm — ABNORMAL HIGH (ref 0–1000)
Total Nucleated Cell Count, Fluid: 306525 cu mm — ABNORMAL HIGH (ref 0–1000)

## 2021-08-20 LAB — RENAL FUNCTION PANEL
Albumin: <1.5 g/dL — ABNORMAL LOW (ref 3.5–5.0)
Anion gap: 8 (ref 5–15)
BUN: 62 mg/dL — ABNORMAL HIGH (ref 6–20)
CO2: 22 mmol/L (ref 22–32)
Calcium: 7.4 mg/dL — ABNORMAL LOW (ref 8.9–10.3)
Chloride: 107 mmol/L (ref 98–111)
Creatinine, Ser: 5.37 mg/dL — ABNORMAL HIGH (ref 0.61–1.24)
GFR, Estimated: 14 mL/min — ABNORMAL LOW (ref >60)
Glucose, Bld: 110 mg/dL — ABNORMAL HIGH (ref 70–99)
Phosphorus: 9.4 mg/dL — ABNORMAL HIGH (ref 2.5–4.6)
Potassium: 4.6 mmol/L (ref 3.5–5.1)
Sodium: 137 mmol/L (ref 135–145)

## 2021-08-20 LAB — APTT: aPTT: 42 seconds — ABNORMAL HIGH (ref 24–36)

## 2021-08-20 LAB — PREPARE RBC (CROSSMATCH)

## 2021-08-20 SURGERY — VIDEO ASSISTED THORACOSCOPY (VATS)/EMPYEMA
Anesthesia: General | Site: Chest | Laterality: Left

## 2021-08-20 MED ORDER — ESMOLOL HCL 100 MG/10ML IV SOLN
INTRAVENOUS | Status: AC
  Filled 2021-08-20: qty 10

## 2021-08-20 MED ORDER — ONDANSETRON HCL 4 MG/2ML IJ SOLN
4.0000 mg | Freq: Four times a day (QID) | INTRAMUSCULAR | Status: DC | PRN
  Administered 2021-08-23: 4 mg via INTRAVENOUS
  Filled 2021-08-20: qty 2

## 2021-08-20 MED ORDER — PROPOFOL 10 MG/ML IV BOLUS
INTRAVENOUS | Status: AC
  Filled 2021-08-20: qty 40

## 2021-08-20 MED ORDER — ONDANSETRON HCL 4 MG/2ML IJ SOLN: 4.0000 mg | Freq: Four times a day (QID) | INTRAMUSCULAR | Status: DC | PRN

## 2021-08-20 MED ORDER — LACTATED RINGERS IV SOLN: INTRAVENOUS | Status: DC | PRN

## 2021-08-20 MED ORDER — MIDAZOLAM HCL 2 MG/2ML IJ SOLN
INTRAMUSCULAR | Status: AC
  Filled 2021-08-20: qty 2

## 2021-08-20 MED ORDER — LIDOCAINE 2% (20 MG/ML) 5 ML SYRINGE
INTRAMUSCULAR | Status: AC
  Filled 2021-08-20: qty 5

## 2021-08-20 MED ORDER — CHLORHEXIDINE GLUCONATE CLOTH 2 % EX PADS
6.0000 | MEDICATED_PAD | Freq: Every day | CUTANEOUS | Status: DC
  Administered 2021-08-20 – 2021-08-21 (×2): 6 via TOPICAL

## 2021-08-20 MED ORDER — DEXMEDETOMIDINE (PRECEDEX) IN NS 20 MCG/5ML (4 MCG/ML) IV SYRINGE
PREFILLED_SYRINGE | INTRAVENOUS | Status: DC | PRN
  Administered 2021-08-20: 12 ug via INTRAVENOUS
  Administered 2021-08-20: 8 ug via INTRAVENOUS

## 2021-08-20 MED ORDER — HYDRALAZINE HCL 20 MG/ML IJ SOLN
10.0000 mg | Freq: Four times a day (QID) | INTRAMUSCULAR | Status: DC | PRN
  Administered 2021-08-22: 10 mg via INTRAVENOUS
  Filled 2021-08-20: qty 1

## 2021-08-20 MED ORDER — HYDROMORPHONE HCL 1 MG/ML IJ SOLN
0.2500 mg | INTRAMUSCULAR | Status: DC | PRN
  Administered 2021-08-20: 0.5 mg via INTRAVENOUS

## 2021-08-20 MED ORDER — BISACODYL 5 MG PO TBEC
10.0000 mg | DELAYED_RELEASE_TABLET | Freq: Every day | ORAL | Status: DC
  Filled 2021-08-20: qty 2

## 2021-08-20 MED ORDER — FENTANYL 50 MCG/ML IV PCA SOLN
INTRAVENOUS | Status: DC
  Administered 2021-08-20: 210 ug via INTRAVENOUS
  Filled 2021-08-20 (×2): qty 20

## 2021-08-20 MED ORDER — CHLORHEXIDINE GLUCONATE 0.12 % MT SOLN
OROMUCOSAL | Status: AC
  Administered 2021-08-20: 15 mL
  Filled 2021-08-20: qty 15

## 2021-08-20 MED ORDER — FENTANYL CITRATE (PF) 100 MCG/2ML IJ SOLN
INTRAMUSCULAR | Status: AC
  Filled 2021-08-20: qty 2

## 2021-08-20 MED ORDER — ACETAMINOPHEN 500 MG PO TABS
1000.0000 mg | ORAL_TABLET | Freq: Four times a day (QID) | ORAL | Status: DC
  Administered 2021-08-20 – 2021-08-24 (×13): 1000 mg via ORAL
  Filled 2021-08-20 (×14): qty 2

## 2021-08-20 MED ORDER — ACETAMINOPHEN 160 MG/5ML PO SOLN
1000.0000 mg | Freq: Four times a day (QID) | ORAL | Status: DC
  Filled 2021-08-20: qty 40.6

## 2021-08-20 MED ORDER — SODIUM CHLORIDE 0.9% IV SOLUTION: Freq: Once | INTRAVENOUS | Status: DC

## 2021-08-20 MED ORDER — PROPOFOL 10 MG/ML IV BOLUS
INTRAVENOUS | Status: DC | PRN
  Administered 2021-08-20: 100 mg via INTRAVENOUS

## 2021-08-20 MED ORDER — ESMOLOL HCL 100 MG/10ML IV SOLN
INTRAVENOUS | Status: DC | PRN
  Administered 2021-08-20: 10 mg via INTRAVENOUS

## 2021-08-20 MED ORDER — ONDANSETRON HCL 4 MG/2ML IJ SOLN
INTRAMUSCULAR | Status: AC
  Filled 2021-08-20: qty 2

## 2021-08-20 MED ORDER — DEXMEDETOMIDINE (PRECEDEX) IN NS 20 MCG/5ML (4 MCG/ML) IV SYRINGE
PREFILLED_SYRINGE | INTRAVENOUS | Status: AC
  Filled 2021-08-20: qty 5

## 2021-08-20 MED ORDER — HYDRALAZINE HCL 20 MG/ML IJ SOLN
INTRAMUSCULAR | Status: AC
  Administered 2021-08-20: 10 mg via INTRAVENOUS
  Filled 2021-08-20: qty 1

## 2021-08-20 MED ORDER — MIDAZOLAM HCL 2 MG/2ML IJ SOLN
INTRAMUSCULAR | Status: DC | PRN
  Administered 2021-08-20: 2 mg via INTRAVENOUS

## 2021-08-20 MED ORDER — PROMETHAZINE HCL 25 MG/ML IJ SOLN: 6.2500 mg | INTRAMUSCULAR | Status: DC | PRN

## 2021-08-20 MED ORDER — VANCOMYCIN HCL 1000 MG IV SOLR
INTRAVENOUS | Status: DC | PRN
  Administered 2021-08-20: 1000 mL

## 2021-08-20 MED ORDER — ROCURONIUM BROMIDE 10 MG/ML (PF) SYRINGE
PREFILLED_SYRINGE | INTRAVENOUS | Status: DC | PRN
  Administered 2021-08-20: 60 mg via INTRAVENOUS
  Administered 2021-08-20: 40 mg via INTRAVENOUS

## 2021-08-20 MED ORDER — BUPIVACAINE HCL (PF) 0.5 % IJ SOLN
INTRAMUSCULAR | Status: DC | PRN
  Administered 2021-08-20: 5 mL

## 2021-08-20 MED ORDER — SENNOSIDES-DOCUSATE SODIUM 8.6-50 MG PO TABS
1.0000 | ORAL_TABLET | Freq: Every day | ORAL | Status: DC
  Administered 2021-08-20 – 2021-08-21 (×2): 1 via ORAL
  Filled 2021-08-20 (×2): qty 1

## 2021-08-20 MED ORDER — ONDANSETRON HCL 4 MG/2ML IJ SOLN
INTRAMUSCULAR | Status: DC | PRN
  Administered 2021-08-20: 4 mg via INTRAVENOUS

## 2021-08-20 MED ORDER — SODIUM CHLORIDE 0.9% FLUSH: 9.0000 mL | INTRAVENOUS | Status: DC | PRN

## 2021-08-20 MED ORDER — DEXAMETHASONE SODIUM PHOSPHATE 10 MG/ML IJ SOLN
INTRAMUSCULAR | Status: AC
  Filled 2021-08-20: qty 1

## 2021-08-20 MED ORDER — SUGAMMADEX SODIUM 200 MG/2ML IV SOLN
INTRAVENOUS | Status: DC | PRN
  Administered 2021-08-20: 300 mg via INTRAVENOUS

## 2021-08-20 MED ORDER — HYDROMORPHONE HCL 1 MG/ML IJ SOLN
INTRAMUSCULAR | Status: AC
  Filled 2021-08-20: qty 1

## 2021-08-20 MED ORDER — 0.9 % SODIUM CHLORIDE (POUR BTL) OPTIME
TOPICAL | Status: DC | PRN
  Administered 2021-08-20: 1000 mL

## 2021-08-20 MED ORDER — BUPIVACAINE HCL (PF) 0.5 % IJ SOLN
INTRAMUSCULAR | Status: AC
  Filled 2021-08-20: qty 10

## 2021-08-20 MED ORDER — VANCOMYCIN HCL 1000 MG IV SOLR
INTRAVENOUS | Status: DC
  Filled 2021-08-20: qty 20

## 2021-08-20 MED ORDER — OXYCODONE HCL 5 MG PO TABS: 5.0000 mg | ORAL_TABLET | Freq: Once | ORAL | Status: DC | PRN

## 2021-08-20 MED ORDER — FENTANYL CITRATE (PF) 100 MCG/2ML IJ SOLN
25.0000 ug | INTRAMUSCULAR | Status: DC | PRN
  Administered 2021-08-20: 50 ug via INTRAVENOUS

## 2021-08-20 MED ORDER — ALBUMIN HUMAN 25 % IV SOLN
25.0000 g | Freq: Four times a day (QID) | INTRAVENOUS | Status: DC
  Filled 2021-08-20 (×2): qty 100

## 2021-08-20 MED ORDER — NALOXONE HCL 0.4 MG/ML IJ SOLN: 0.4000 mg | INTRAMUSCULAR | Status: DC | PRN

## 2021-08-20 MED ORDER — BUPIVACAINE 0.5 % ON-Q PUMP SINGLE CATH 400 ML
INJECTION | Status: AC | PRN
  Administered 2021-08-20: 400 mL

## 2021-08-20 MED ORDER — DIPHENHYDRAMINE HCL 50 MG/ML IJ SOLN: 12.5000 mg | Freq: Four times a day (QID) | INTRAMUSCULAR | Status: DC | PRN

## 2021-08-20 MED ORDER — BUPIVACAINE 0.5 % ON-Q PUMP SINGLE CATH 400 ML
400.0000 mL | INJECTION | Status: AC
  Filled 2021-08-20: qty 400

## 2021-08-20 MED ORDER — OXYCODONE HCL 5 MG/5ML PO SOLN
5.0000 mg | Freq: Once | ORAL | Status: DC | PRN
Start: 2021-08-20 — End: 2021-08-20

## 2021-08-20 MED ORDER — FUROSEMIDE 10 MG/ML IJ SOLN
80.0000 mg | Freq: Four times a day (QID) | INTRAMUSCULAR | Status: DC
  Administered 2021-08-21 – 2021-08-23 (×10): 80 mg via INTRAVENOUS
  Filled 2021-08-20 (×10): qty 8

## 2021-08-20 MED ORDER — DIPHENHYDRAMINE HCL 12.5 MG/5ML PO ELIX: 12.5000 mg | ORAL_SOLUTION | Freq: Four times a day (QID) | ORAL | Status: DC | PRN

## 2021-08-20 MED ORDER — LIDOCAINE 2% (20 MG/ML) 5 ML SYRINGE
INTRAMUSCULAR | Status: DC | PRN
  Administered 2021-08-20: 60 mg via INTRAVENOUS

## 2021-08-20 MED ORDER — FENTANYL CITRATE (PF) 250 MCG/5ML IJ SOLN
INTRAMUSCULAR | Status: DC | PRN
  Administered 2021-08-20: 150 ug via INTRAVENOUS
  Administered 2021-08-20: 100 ug via INTRAVENOUS

## 2021-08-20 MED ORDER — DEXAMETHASONE SODIUM PHOSPHATE 10 MG/ML IJ SOLN
INTRAMUSCULAR | Status: DC | PRN
  Administered 2021-08-20: 10 mg via INTRAVENOUS

## 2021-08-20 MED ORDER — ACETAMINOPHEN 10 MG/ML IV SOLN: 1000.0000 mg | Freq: Once | INTRAVENOUS | Status: DC | PRN

## 2021-08-20 MED ORDER — FENTANYL CITRATE (PF) 250 MCG/5ML IJ SOLN
INTRAMUSCULAR | Status: AC
  Filled 2021-08-20: qty 5

## 2021-08-20 MED ORDER — OXYCODONE HCL 5 MG PO TABS
5.0000 mg | ORAL_TABLET | ORAL | Status: DC | PRN
  Administered 2021-08-20 – 2021-08-21 (×2): 10 mg via ORAL
  Administered 2021-08-21: 5 mg via ORAL
  Administered 2021-08-21 – 2021-08-23 (×9): 10 mg via ORAL
  Administered 2021-08-23: 5 mg via ORAL
  Administered 2021-08-23 – 2021-08-25 (×4): 10 mg via ORAL
  Filled 2021-08-20 (×15): qty 2
  Filled 2021-08-20: qty 1
  Filled 2021-08-20: qty 2

## 2021-08-20 SURGICAL SUPPLY — 78 items
ADH SKN CLS APL DERMABOND .7 (GAUZE/BANDAGES/DRESSINGS)
BAG DECANTER FOR FLEXI CONT (MISCELLANEOUS) IMPLANT
BLADE CLIPPER SURG (BLADE) ×2 IMPLANT
BLADE SURG 11 STRL SS (BLADE) ×4 IMPLANT
CANISTER SUCT 3000ML PPV (MISCELLANEOUS) ×3 IMPLANT
CATH KIT ON-Q SILVERSOAK 5 (CATHETERS) IMPLANT
CATH KIT ON-Q SILVERSOAK 5IN (CATHETERS) ×2 IMPLANT
CATH ROBINSON RED A/P 22FR (CATHETERS) IMPLANT
CATH THORACIC 28FR (CATHETERS) IMPLANT
CATH THORACIC 28FR RT ANG (CATHETERS) IMPLANT
CATH THORACIC 36FR (CATHETERS) IMPLANT
CNTNR URN SCR LID CUP LEK RST (MISCELLANEOUS) ×4 IMPLANT
CONN ST 1/4X3/8  BEN (MISCELLANEOUS) ×2
CONN ST 1/4X3/8 BEN (MISCELLANEOUS) IMPLANT
CONT SPEC 4OZ STRL OR WHT (MISCELLANEOUS) ×4
CONTAINER PROTECT SURGISLUSH (MISCELLANEOUS) ×5 IMPLANT
DERMABOND ADVANCED (GAUZE/BANDAGES/DRESSINGS)
DERMABOND ADVANCED .7 DNX12 (GAUZE/BANDAGES/DRESSINGS) IMPLANT
DRAIN CHANNEL 28F RND 3/8 FF (WOUND CARE) ×1 IMPLANT
DRAPE LAPAROSCOPIC ABDOMINAL (DRAPES) ×3 IMPLANT
DRAPE WARM FLUID 44X44 (DRAPES) ×2 IMPLANT
ELECT BLADE 4.0 EZ CLEAN MEGAD (MISCELLANEOUS) ×2
ELECT BLADE 6.5 EXT (BLADE) ×1 IMPLANT
ELECT REM PT RETURN 9FT ADLT (ELECTROSURGICAL) ×2
ELECTRODE BLDE 4.0 EZ CLN MEGD (MISCELLANEOUS) IMPLANT
ELECTRODE REM PT RTRN 9FT ADLT (ELECTROSURGICAL) ×2 IMPLANT
GAUZE 4X4 16PLY ~~LOC~~+RFID DBL (SPONGE) ×3 IMPLANT
GAUZE SPONGE 4X4 12PLY STRL (GAUZE/BANDAGES/DRESSINGS) ×3 IMPLANT
GLOVE SURG ENC TEXT LTX SZ7.5 (GLOVE) ×3 IMPLANT
GLOVE SURG UNDER LTX SZ8 (GLOVE) ×3 IMPLANT
GOWN STRL REUS W/ TWL LRG LVL3 (GOWN DISPOSABLE) ×6 IMPLANT
GOWN STRL REUS W/TWL LRG LVL3 (GOWN DISPOSABLE) ×8
KIT BASIN OR (CUSTOM PROCEDURE TRAY) ×3 IMPLANT
KIT SUCTION CATH 14FR (SUCTIONS) ×2 IMPLANT
KIT TURNOVER KIT B (KITS) ×3 IMPLANT
NEEDLE 22X1 1/2 (OR ONLY) (NEEDLE) ×1 IMPLANT
NS IRRIG 1000ML POUR BTL (IV SOLUTION) ×10 IMPLANT
PACK CHEST (CUSTOM PROCEDURE TRAY) ×3 IMPLANT
PAD ARMBOARD 7.5X6 YLW CONV (MISCELLANEOUS) ×6 IMPLANT
SEALANT SURG COSEAL 4ML (VASCULAR PRODUCTS) IMPLANT
SOL ANTI FOG 6CC (MISCELLANEOUS) ×2 IMPLANT
SOLUTION ANTI FOG 6CC (MISCELLANEOUS) ×1
SPONGE T-LAP 18X18 ~~LOC~~+RFID (SPONGE) ×10 IMPLANT
SPONGE T-LAP 4X18 ~~LOC~~+RFID (SPONGE) ×3 IMPLANT
SPONGE TONSIL 1 RF SGL (DISPOSABLE) ×1 IMPLANT
SPONGE TONSIL TAPE 1 RFD (DISPOSABLE) ×3 IMPLANT
SUT CHROMIC 3 0 SH 27 (SUTURE) IMPLANT
SUT ETHILON 3 0 PS 1 (SUTURE) IMPLANT
SUT PROLENE 3 0 SH DA (SUTURE) IMPLANT
SUT PROLENE 4 0 RB 1 (SUTURE)
SUT PROLENE 4-0 RB1 .5 CRCL 36 (SUTURE) IMPLANT
SUT PROLENE 6 0 C 1 30 (SUTURE) IMPLANT
SUT SILK  1 MH (SUTURE) ×2
SUT SILK 1 MH (SUTURE) ×4 IMPLANT
SUT SILK 1 TIES 10X30 (SUTURE) IMPLANT
SUT SILK 2 0 SH (SUTURE) ×1 IMPLANT
SUT SILK 2 0SH CR/8 30 (SUTURE) IMPLANT
SUT SILK 3 0SH CR/8 30 (SUTURE) IMPLANT
SUT VIC AB 1 CTX 18 (SUTURE) ×1 IMPLANT
SUT VIC AB 2 TP1 27 (SUTURE) IMPLANT
SUT VIC AB 2-0 CT2 18 VCP726D (SUTURE) IMPLANT
SUT VIC AB 2-0 CTX 36 (SUTURE) ×1 IMPLANT
SUT VIC AB 3-0 SH 18 (SUTURE) IMPLANT
SUT VIC AB 3-0 X1 27 (SUTURE) IMPLANT
SUT VICRYL 0 UR6 27IN ABS (SUTURE) IMPLANT
SUT VICRYL 2 TP 1 (SUTURE) ×1 IMPLANT
SWAB COLLECTION DEVICE MRSA (MISCELLANEOUS) ×1 IMPLANT
SWAB CULTURE ESWAB REG 1ML (MISCELLANEOUS) ×1 IMPLANT
SYSTEM SAHARA CHEST DRAIN ATS (WOUND CARE) ×3 IMPLANT
TAPE CLOTH SURG 4X10 WHT LF (GAUZE/BANDAGES/DRESSINGS) ×1 IMPLANT
TIP APPLICATOR SPRAY EXTEND 16 (VASCULAR PRODUCTS) IMPLANT
TOWEL GREEN STERILE (TOWEL DISPOSABLE) ×3 IMPLANT
TOWEL GREEN STERILE FF (TOWEL DISPOSABLE) ×3 IMPLANT
TRAP SPECIMEN MUCUS 40CC (MISCELLANEOUS) ×1 IMPLANT
TRAY FOLEY MTR SLVR 16FR STAT (SET/KITS/TRAYS/PACK) ×3 IMPLANT
TROCAR XCEL BLADELESS 5X75MML (TROCAR) ×1 IMPLANT
TUNNELER SHEATH ON-Q 11GX8 DSP (PAIN MANAGEMENT) ×1 IMPLANT
WATER STERILE IRR 1000ML POUR (IV SOLUTION) ×5 IMPLANT

## 2021-08-20 NOTE — Plan of Care (Signed)
°  Problem: Clinical Measurements: Goal: Ability to maintain clinical measurements within normal limits will improve Outcome: Progressing Goal: Respiratory complications will improve Outcome: Progressing Goal: Cardiovascular complication will be avoided Outcome: Progressing   Problem: Activity: Goal: Risk for activity intolerance will decrease Outcome: Progressing   Problem: Nutrition: Goal: Adequate nutrition will be maintained Outcome: Progressing   Problem: Elimination: Goal: Will not experience complications related to bowel motility Outcome: Progressing   Problem: Safety: Goal: Ability to remain free from injury will improve Outcome: Progressing

## 2021-08-20 NOTE — Progress Notes (Signed)
Subjective:   Patient underwent thoracoscopy and mini thoracotomy to help with left empyema.  Patient seen postop.  Somewhat confused and in pain.  Unable to relate specific concerns.  Did have good urine output during the surgery.  Also 2.5 L of urine made yesterday.  Creatinine slightly better at 5.4.  Objective Vital signs in last 24 hours: Vitals:   08/19/21 1626 08/19/21 2306 08/20/21 0458 08/20/21 0828  BP: 123/87 (!) 131/93 133/88 (!) 152/97  Pulse:  92 (!) 111   Resp:  20 20 18   Temp: 98.2 F (36.8 C) (!) 97.4 F (36.3 C) 99.1 F (37.3 C) 98.7 F (37.1 C)  TempSrc: Oral Axillary Oral Oral  SpO2:  97% 96% 97%  Weight:      Height:       Weight change:   Intake/Output Summary (Last 24 hours) at 08/20/2021 1207 Last data filed at 08/20/2021 1124 Gross per 24 hour  Intake 1665.02 ml  Output 3010 ml  Net -1344.98 ml     Assessment/Plan:25 ear old BM with history of FSG-  course complicated by frequent flaring and medication non compliance-  now with A on CRF and likely disease flaring  1.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  Creatinine was normal and in November 2022.  15 days prior to admission creatinine was around 2.2.  Felt that his acute kidney injury here with creatinine up to 6 was more likely ATN on top of his FSGS given the background of infection.  He has been treated with steroids in the past.    It is likely patient is going to need alternative immunotherapy after everything is said and done.  Not excited about a CN I right now given his active AKI.  Rituximab could be considered but would want to adequately treat his significant infection first.  Trying to avoid excessive steroids. -Continue to monitor creatinine daily -Give IV albumin given significantly decreased serum albumin -Continue treatment of infection -Consider further immunosuppression based on response   2. Hypertension/volume   -significant edema.  IV albumin.  Continue IV Lasix 3.  Appetite sxms-possibly some degree of uremia but also GERD and abdominal edema. 4. Anemia  - indicates long term CKD vs GIB possibly due to long term steroids -  have put him on protonix -  and give dose of ESA -  stable  5.  Elevated WBC/bilateral empyema-  fever. Leukopenia and pyuria-initially treated for UTI now with concern of bilateral empyema.  Appreciate primary and consultant management.  Continue antibiotics     Philip Trevino    Labs: Basic Metabolic Panel: Recent Labs  Lab 08/18/21 0157 08/19/21 0121 08/20/21 0051  NA 133* 134* 137  K 4.8 4.8 4.6  CL 104 104 107  CO2 19* 20* 22  GLUCOSE 122* 114* 110*  BUN 75* 70* 62*  CREATININE 6.20* 5.79* 5.37*  CALCIUM 7.0* 7.0* 7.4*  PHOS 9.1* 9.0* 9.4*   Liver Function Tests: Recent Labs  Lab 08/14/21 0938 08/16/21 0256 08/18/21 0157 08/19/21 0121 08/20/21 0051  AST 33  --   --   --   --   ALT 12  --   --   --   --   ALKPHOS 79  --   --   --   --   BILITOT 0.2*  --   --   --   --   PROT 5.9*  --   --   --   --  ALBUMIN <1.5*   < > <1.5* <1.5* <1.5*   < > = values in this interval not displayed.   Recent Labs  Lab 08/14/21 0938  LIPASE 45   No results for input(s): AMMONIA in the last 168 hours. CBC: Recent Labs  Lab 08/14/21 0938 08/15/21 0925 08/16/21 0256 08/17/21 0140 08/18/21 0157 08/19/21 0121 08/20/21 0051  WBC 25.0*   < > 24.8* 24.8* 24.4* 18.1* 13.3*  NEUTROABS 21.1*  --  21.0*  --   --   --   --   HGB 8.7*   < > 8.7* 9.0* 8.2* 9.1* 8.3*  HCT 26.4*   < > 26.4* 26.4* 24.3* 26.9* 25.8*  MCV 88.6   < > 87.4 87.1 87.1 86.8 87.8  PLT 1,082*   < > 908* 963* 788* 871* 860*   < > = values in this interval not displayed.   Cardiac Enzymes: No results for input(s): CKTOTAL, CKMB, CKMBINDEX, TROPONINI in the last 168 hours. CBG: No results for input(s): GLUCAP in the last 168 hours.  Iron Studies: No results for input(s): IRON, TIBC,  TRANSFERRIN, FERRITIN in the last 72 hours. Studies/Results: CT chest w/o contrast  Result Date: 08/20/2021 CLINICAL DATA:  Follow-up left empyema, treated with lytic therapy. EXAM: CT CHEST WITHOUT CONTRAST TECHNIQUE: Multidetector CT imaging of the chest was performed following the standard protocol without IV contrast. RADIATION DOSE REDUCTION: This exam was performed according to the departmental dose-optimization program which includes automated exposure control, adjustment of the mA and/or kV according to patient size and/or use of iterative reconstruction technique. COMPARISON:  08/18/2021 FINDINGS: Cardiovascular: No significant vascular findings. Normal heart size. No pericardial effusion. Mediastinum/Nodes: No enlarged mediastinal or axillary lymph nodes. Thyroid gland, trachea, and esophagus demonstrate no significant findings. Lungs/Pleura: Interval bilateral pleural pigtail catheters. Moderate-sized left pleural effusion with an approximately 50% decrease in size. Interval small amount of interspersed air or gas in the pleural fluid on the left. Small right pleural effusion with an approximately 90% decrease in size. Bilateral lower lobe atelectasis without significant change. Upper Abdomen: Unremarkable. Musculoskeletal: Normal appearing bones. IMPRESSION: 1. Interval bilateral pleural catheters. 2. Moderate left pleural effusion with an approximately 50% decrease in size. 3. Small right pleural effusion with an approximately 90% decrease in size. 4. Interval small amount of air or gas interspersed in the left pleural fluid, most likely introduced by the left pleural catheter. The fact that this is suspended within the fluid indicates a thicker fluid. 5. No significant change in bilateral lower lobe atelectasis Electronically Signed   By: Claudie Revering M.D.   On: 08/20/2021 08:19   DG Chest Port 1 View  Result Date: 08/20/2021 CLINICAL DATA:  Chest tubes. EXAM: PORTABLE CHEST 1 VIEW COMPARISON:   Chest XR, most recently 08/19/2021. CT chest, 08/18/2021. IR CT, 08/18/2018. FINDINGS: Support lines: Stable positioning of bilateral basilar-directed thoracostomy tubes. Obscured cardiac apex. Hypoinflated lungs. Similar appearance with LEFT basilar consolidation and moderate pleural effusion. Trace RIGHT pleural effusion. No pneumothorax. No interval osseous abnormality. IMPRESSION: 1. Stable positioning of bilateral pigtail thoracostomy tubes. No pneumothorax. 2. Similar LEFT basilar consolidation and moderate pleural effusion. 3. Trace RIGHT pleural effusion Electronically Signed   By: Michaelle Birks M.D.   On: 08/20/2021 07:17   DG Chest Port 1 View  Result Date: 08/19/2021 CLINICAL DATA:  Bilateral pleural effusions. EXAM: PORTABLE CHEST 1 VIEW COMPARISON:  Radiograph August 14, 2021 and CT August 18, 2021. FINDINGS: Pigtail thoracostomy is in the bilateral lung bases. Similar  size of the small moderate right with slightly decreased size of the moderate left pleural effusions. Adjacent airspace opacities are similar. The heart size and mediastinal contours are unchanged. The visualized skeletal structures are stable. IMPRESSION: Similar size of the small-moderate right pleural effusion with slightly decreased size of the moderate left pleural effusion and stable adjacent opacities which may reflect atelectasis or infection. Electronically Signed   By: Dahlia Bailiff M.D.   On: 08/19/2021 08:17   CT [facility] PLEURAL DRAIN W/INDWELL CATH W/IMG GUIDE  Result Date: 08/18/2021 INDICATION: Bilateral loculated pleural effusions.  Question empyema. EXAM: CT PERC PLEURAL DRAIN W/INDWELL CATH W/IMG GUIDE Procedures: CT-GUIDED BILATERAL PIGTAIL THORACOSTOMY TUBE PLACEMENT RADIATION DOSE REDUCTION: This exam was performed according to the departmental dose-optimization program which includes automated exposure control, adjustment of the mA and/or kV according to patient size and/or use of iterative reconstruction  technique. COMPARISON:  CT chest, earlier same day. MEDICATIONS: The patient is currently admitted to the hospital and receiving intravenous antibiotics. The antibiotics were administered within an appropriate time frame prior to the initiation of the procedure. ANESTHESIA/SEDATION: Local anesthetic and single agent sedation was employed during this procedure. A total of Versed 4 mg was administered intravenously. The patient's level of consciousness and vital signs were monitored continuously by radiology nursing throughout the procedure under my direct supervision. CONTRAST:  None COMPLICATIONS: None immediate. PROCEDURE: Informed written consent was obtained from the patient and/or patient's representative after a discussion of the risks, benefits and alternatives to treatment. The patient was placed supine on the CT gantry and a pre procedural CT was performed re-demonstrating the bilateral pleural collections. The procedure was planned. A timeout was performed prior to the initiation of the procedure. The RIGHT and LEFT chest was prepped and draped in the usual sterile fashion. The procedure began in the patient's LEFT side. The overlying soft tissues were anesthetized with 1% lidocaine with epinephrine. Appropriate trajectory was planned with the use of a 22 gauge spinal needle. An 18 gauge trocar needle was advanced into the pleural collection and a short Amplatz super stiff wire was coiled within the collection. Appropriate positioning was confirmed with a limited CT scan. The tract was serially dilated allowing placement of a 12 [place] all-purpose drainage catheter. Upon confirmation of the patient's stable vital signs, the procedure was repeated as above on the patient's RIGHT side. Following bilateral pleural drainage catheter placement, appropriate positioning was confirmed with a limited postprocedural CT scan. Approximally 10 mL of milky pleural fluid was aspirated from each drain and submitted for  microbiological analysis. The tubes were connected to pleura vacs on negative suction and sutured in place. Dressings were placed. The patient tolerated the procedure well without immediate post procedural complication. IMPRESSION: Successful CT-guided placement of bilateral 12 Fr pleural drainage catheters into the posterior dependent portions of the loculated pleural collections. Samples were sent to the laboratory as requested by the ordering clinical team. Michaelle Birks, MD [facility] Radiology Electronically Signed   By: Michaelle Birks M.D.   On: 08/18/2021 19:49   CT [facility] PLEURAL DRAIN W/INDWELL CATH W/IMG GUIDE  Result Date: 08/18/2021 INDICATION: Bilateral loculated pleural effusions.  Question empyema. EXAM: CT PERC PLEURAL DRAIN W/INDWELL CATH W/IMG GUIDE Procedures: CT-GUIDED BILATERAL PIGTAIL THORACOSTOMY TUBE PLACEMENT RADIATION DOSE REDUCTION: This exam was performed according to the departmental dose-optimization program which includes automated exposure control, adjustment of the mA and/or kV according to patient size and/or use of iterative reconstruction technique. COMPARISON:  CT  chest, earlier same day. MEDICATIONS: The patient is currently admitted to the hospital and receiving intravenous antibiotics. The antibiotics were administered within an appropriate time frame prior to the initiation of the procedure. ANESTHESIA/SEDATION: Local anesthetic and single agent sedation was employed during this procedure. A total of Versed 4 mg was administered intravenously. The patient's level of consciousness and vital signs were monitored continuously by radiology nursing throughout the procedure under my direct supervision. CONTRAST:  None COMPLICATIONS: None immediate. PROCEDURE: Informed written consent was obtained from the patient and/or patient's representative after a discussion of the risks, benefits and alternatives to treatment. The patient was  placed supine on the CT gantry and a pre procedural CT was performed re-demonstrating the bilateral pleural collections. The procedure was planned. A timeout was performed prior to the initiation of the procedure. The RIGHT and LEFT chest was prepped and draped in the usual sterile fashion. The procedure began in the patient's LEFT side. The overlying soft tissues were anesthetized with 1% lidocaine with epinephrine. Appropriate trajectory was planned with the use of a 22 gauge spinal needle. An 18 gauge trocar needle was advanced into the pleural collection and a short Amplatz super stiff wire was coiled within the collection. Appropriate positioning was confirmed with a limited CT scan. The tract was serially dilated allowing placement of a 12 [place] all-purpose drainage catheter. Upon confirmation of the patient's stable vital signs, the procedure was repeated as above on the patient's RIGHT side. Following bilateral pleural drainage catheter placement, appropriate positioning was confirmed with a limited postprocedural CT scan. Approximally 10 mL of milky pleural fluid was aspirated from each drain and submitted for microbiological analysis. The tubes were connected to pleura vacs on negative suction and sutured in place. Dressings were placed. The patient tolerated the procedure well without immediate post procedural complication. IMPRESSION: Successful CT-guided placement of bilateral 12 Fr pleural drainage catheters into the posterior dependent portions of the loculated pleural collections. Samples were sent to the laboratory as requested by the ordering clinical team. Michaelle Birks, MD [facility] Radiology Electronically Signed   By: Michaelle Birks M.D.   On: 08/18/2021 19:49   Medications: Infusions:  acetaminophen     [MAR Hold] albumin human     bupivacaine 0.5 % ON-Q pump SINGLE CATH 400 mL     [MAR Hold] piperacillin-tazobactam (ZOSYN)  IV 3.375 g  (08/20/21 8127)     Scheduled Medications:  sodium chloride   Intravenous Once   [MAR Hold] atorvastatin  80 mg Oral Daily   [MAR Hold] darbepoetin (ARANESP) injection - NON-DIALYSIS  100 mcg Subcutaneous Q Thu-1800   [MAR Hold] ezetimibe  10 mg Oral Daily   fentaNYL       fentaNYL   Intravenous Q4H   [MAR Hold] furosemide  80 mg Intravenous Q6H   [MAR Hold] pantoprazole  40 mg Oral Daily   [MAR Hold] sodium chloride flush  10 mL Other Q8H   [MAR Hold] sodium chloride flush  10 mL Other Q8H   vancomycin 1000 mg in NS (1000 ml) irrigation for Dr. Roxy Manns case   Irrigation To OR    have reviewed scheduled and prn medications.  Physical Exam: General: f lying in bed, in pain, mild distress Heart: Tachycardia, regular rhythm Lungs: Bilateral chest rise, short quick breathing Abdomen: soft, non tender Extremities: pitting edema, warm and well perfused    08/20/2021,12:07 PM  LOS: 6 days

## 2021-08-20 NOTE — Progress Notes (Signed)
Patient ID: Philip Trevino, male   DOB: 05-17-1996, 26 y.o.   MRN: [id number] TCTS Evening Rounds:   Hemodynamically stable  Sats 96% [place]  Chest tube output low, no air leak.

## 2021-08-20 NOTE — Progress Notes (Signed)
PROGRESS NOTE    Philip Trevino  ERX:[id number] DOB: 09-Jun-1996 DOA: 08/14/2021 PCP: Kerin Perna, NP   Brief Narrative: 26 year old past medical history significant for focal segmental glomerulosclerosis, minimal-change disease, hypertension presents with multiple complaints.  Over the last 2 or 3 weeks he noticed loss of taste, fatigue, weakness.  Pleuritic chest pain.  Chest congestion.  Subjective fever.  Multiple episodes of diarrhea.  He was supposed to follow-up with nephrology, but due to transportation issue never follow-up.  Not taking any home medications.  Evaluation in the ED: COVID-negative, BUN 76, creatinine 6.4 albumin less than 1.5, total protein 5.9.  Chest x-ray with bibasilar chest density left greater than the right.  Finding suggestive of pleural effusion compressive atelectasis.    Patient spike fever, WBC continue to be elevated. CT chest showed BL pleural effusion/ loculated. CCM consulted for thoracentesis. CCM performed bedside US which showed bilateral loculated effusion without clear window. Recommendation was for IR chest tube placement.   Assessment & Plan:   Principal Problem:   AKI (acute kidney injury) ([place]) Active Problems:   Anemia, unspecified   Benign essential HTN   Leukocytosis   Hyperlipidemia   Pleural effusion, bilateral   1-AKI in the setting of FSG: Hyponatremia; in setting renal failure -Renal biopsy-proven FSGS with frequent flares but usually associated with medication noncompliance -Normal creatinine October November, 2 weeks ago creatinine 2.2 presenting with a creatinine over 6. -AKI also could be related to ATN from low blood pressure, ace.  Also treated for UTI. -Appreciate nephrology assistance. -Nephrology considering further alternative agent to treat  FSG. Infection will need to be resolved/controlled.  -On  IV  lasix. Plan for IV albumin today.  Renal function stable cr 5.3  Bilateral Empyema:  Patient with  Leukocytosis, Fever, BL loculated pleural effusion.  -Chest x ray with atelectasis vs consolidation. -Spike fever 1/27. CT chest 1/28 showed worsening BL pleural effusion left worse than right, loculated, consider empyema.  -CCM consulted, no clear window for safe thoracentesis, concern for empyema. -IR consulted for Chest tube placement. Underwent BL chest tube placement 1/28.  -Pleural fluid consistent with empyema, awaiting triglycerides level.  -Pleural fluid growing streptococcus. Continue with Zosyn to cover anaerobes.  -WBC trending down.  -Underwent VATS of Left pleural effusion. Fail pleural fibrinolytic on the left.  -Dr Nils Pyle following.  -Started on PCA pump for pain management.   Hyperlipidemia:  Continue with Zetia and Lipitor  Hypertension: started on Lasix. Hold lisinopril due to AKI.   Anemia thrombocytosis: On Aranesp.   Thrombocytosis, leukocytosis; related to infection.   Hypoalbuminemia -Suspect related to hypoalbuminemia, in setting of FSG. -IV albumin ordered.    Elevation of troponin: Likely demand in the setting of renal failure  Diarrhea:  Reported 2 episode of loose stool since yesterday Resolved.   Failure to thrive: Likely in the setting of AKI And infection.  HIV nonreactive     Estimated body mass index is 24.93 kg/m as calculated from the following:   Height as of this encounter: 5\' 10"  (1.778 m).   Weight as of this encounter: 78.8 kg.   DVT prophylaxis: SCDs, Heparin  Code Status: Full code Family Communication: Care discussed with patient Disposition Plan:  Status is: Inpatient  Remains inpatient appropriate because: Management for acute renal failure    Consultants:  Nephrology  Procedures:    Antimicrobials:    Subjective: He is sleepy, I saw him in PACU He moan, he is in pain. Plan for ICU for  close observation.    Objective: Vitals:   08/20/21 0828 08/20/21 1155 08/20/21 1210 08/20/21 1225  BP: (!)  152/97 (!) 147/103 (!) 150/99   Pulse:  (!) 103    Resp: 18 (!) 22 (!) 28   Temp: 98.7 F (37.1 C) 97.6 F (36.4 C)    TempSrc: Oral     SpO2: 97% 100%  100%  Weight:      Height:        Intake/Output Summary (Last 24 hours) at 08/20/2021 1236 Last data filed at 08/20/2021 1124 Gross per 24 hour  Intake 1665.02 ml  Output 3010 ml  Net -1344.98 ml    Filed Weights   08/15/21 1400  Weight: 78.8 kg    Examination:  General exam: Sleepy Respiratory system: BL [place], Chest tube in place.  Cardiovascular system: S 1, S 2 RRR Gastrointestinal system: BS present, soft, nt Central nervous system: Non focal.  Extremities: No edema   Data Reviewed: I have personally reviewed following labs and imaging studies  CBC: Recent Labs  Lab 08/14/21 0938 08/15/21 0925 08/16/21 0256 08/17/21 0140 08/18/21 0157 08/19/21 0121 08/20/21 0051  WBC 25.0*   < > 24.8* 24.8* 24.4* 18.1* 13.3*  NEUTROABS 21.1*  --  21.0*  --   --   --   --   HGB 8.7*   < > 8.7* 9.0* 8.2* 9.1* 8.3*  HCT 26.4*   < > 26.4* 26.4* 24.3* 26.9* 25.8*  MCV 88.6   < > 87.4 87.1 87.1 86.8 87.8  PLT 1,082*   < > 908* 963* 788* 871* 860*   < > = values in this interval not displayed.    Basic Metabolic Panel: Recent Labs  Lab 08/14/21 0938 08/15/21 0925 08/16/21 0256 08/17/21 0140 08/18/21 0157 08/19/21 0121 08/20/21 0051  NA 133*   < > 137 130* 133* 134* 137  K 3.9   < > 4.4 4.6 4.8 4.8 4.6  CL 102   < > 100 102 104 104 107  CO2 21*   < > 20* 18* 19* 20* 22  GLUCOSE 100*   < > 106* 102* 122* 114* 110*  BUN 76*   < > 76* 75* 75* 70* 62*  CREATININE 6.41*   < > 6.36* 5.91* 6.20* 5.79* 5.37*  CALCIUM 7.1*   < > 7.7* 7.0* 7.0* 7.0* 7.4*  MG 2.7*  --   --   --   --   --   --   PHOS  --   --  8.2* 9.1* 9.1* 9.0* 9.4*   < > = values in this interval not displayed.    GFR: Estimated Creatinine Clearance: 21.9 mL/min (A) (by C-G formula based on SCr of 5.37 mg/dL (H)). Liver Function Tests: Recent Labs   Lab 08/14/21 0938 08/16/21 0256 08/17/21 0140 08/18/21 0157 08/19/21 0121 08/20/21 0051  AST 33  --   --   --   --   --   ALT 12  --   --   --   --   --   ALKPHOS 79  --   --   --   --   --   BILITOT 0.2*  --   --   --   --   --   PROT 5.9*  --   --   --   --   --   ALBUMIN <1.5* <1.5* <1.5* <1.5* <1.5* <1.5*    Recent Labs  Lab 08/14/21 0938  LIPASE  45    No results for input(s): AMMONIA in the last 168 hours. Coagulation Profile: Recent Labs  Lab 08/18/21 1639  INR 1.3*    Cardiac Enzymes: No results for input(s): CKTOTAL, CKMB, CKMBINDEX, TROPONINI in the last 168 hours. BNP (last 3 results) No results for input(s): PROBNP in the last 8760 hours. HbA1C: No results for input(s): HGBA1C in the last 72 hours. CBG: No results for input(s): GLUCAP in the last 168 hours. Lipid Profile: No results for input(s): CHOL, HDL, LDLCALC, TRIG, CHOLHDL, LDLDIRECT in the last 72 hours.  Thyroid Function Tests: No results for input(s): TSH, T4TOTAL, FREET4, T3FREE, THYROIDAB in the last 72 hours. Anemia Panel: No results for input(s): VITAMINB12, FOLATE, FERRITIN, TIBC, IRON, RETICCTPCT in the last 72 hours. Sepsis Labs: No results for input(s): PROCALCITON, LATICACIDVEN in the last 168 hours.  Recent Results (from the past 240 hour(s))  Resp Panel by RT-PCR (Flu A&B, Covid) Nasopharyngeal Swab     Status: None   Collection Time: 08/14/21  4:26 PM   Specimen: Nasopharyngeal Swab; Nasopharyngeal(NP) swabs in vial transport medium  Result Value Ref Range Status   SARS Coronavirus 2 by RT PCR NEGATIVE NEGATIVE Final    Comment: (NOTE) SARS-CoV-2 target nucleic acids are NOT DETECTED.  The SARS-CoV-2 RNA is generally detectable in upper respiratory specimens during the acute phase of infection. The lowest concentration of SARS-CoV-2 viral copies this assay can detect is 138 copies/mL. A negative result does not preclude SARS-Cov-2 infection and should not be used as the  sole basis for treatment or other patient management decisions. A negative result may occur with  improper specimen collection/handling, submission of specimen other than nasopharyngeal swab, presence of viral mutation(s) within the areas targeted by this assay, and inadequate number of viral copies(<138 copies/mL). A negative result must be combined with clinical observations, patient history, and epidemiological information. The expected result is Negative.  Fact Sheet for Patients:  EntrepreneurPulse.com.au  Fact Sheet for Healthcare Providers:  IncredibleEmployment.be  This test is no t yet approved or cleared by the [place] FDA and  has been authorized for detection and/or diagnosis of SARS-CoV-2 by FDA under an Emergency Use Authorization (EUA). This EUA will remain  in effect (meaning this test can be used) for the duration of the COVID-19 declaration under Section 564(b)(1) of the Act, 21 U.S.C.section 360bbb-3(b)(1), unless the authorization is terminated  or revoked sooner.       Influenza A by PCR NEGATIVE NEGATIVE Final   Influenza B by PCR NEGATIVE NEGATIVE Final    Comment: (NOTE) The Xpert Xpress SARS-CoV-2/FLU/RSV plus assay is intended as an aid in the diagnosis of influenza from Nasopharyngeal swab specimens and should not be used as a sole basis for treatment. Nasal washings and aspirates are unacceptable for Xpert Xpress SARS-CoV-2/FLU/RSV testing.  Fact Sheet for Patients: EntrepreneurPulse.com.au  Fact Sheet for Healthcare Providers: IncredibleEmployment.be  This test is not yet approved or cleared by the [place] FDA and has been authorized for detection and/or diagnosis of SARS-CoV-2 by FDA under an Emergency Use Authorization (EUA). This EUA will remain in effect (meaning this test can be used) for the duration of the COVID-19 declaration under Section 564(b)(1) of the  Act, 21 U.S.C. section 360bbb-3(b)(1), unless the authorization is terminated or revoked.  Performed at [facility], [place] [address] [place], [place] [address]   Culture, blood (routine x 2)     Status: None   Collection Time: 08/14/21  9:32 PM  Specimen: BLOOD RIGHT HAND  Result Value Ref Range Status   Specimen Description BLOOD RIGHT HAND  Final   Special Requests   Final    BOTTLES DRAWN AEROBIC AND ANAEROBIC Blood Culture results may not be optimal due to an inadequate volume of blood received in culture bottles   Culture   Final    NO GROWTH 5 DAYS Performed at [facility], [place] [address] [place], [place] [address]    Report Status 08/19/2021 FINAL  Final  Culture, blood (routine x 2)     Status: None   Collection Time: 08/14/21  9:33 PM   Specimen: BLOOD  Result Value Ref Range Status   Specimen Description BLOOD LEFT ANTECUBITAL  Final   Special Requests   Final    BOTTLES DRAWN AEROBIC AND ANAEROBIC Blood Culture adequate volume   Culture   Final    NO GROWTH 5 DAYS Performed at [facility], [place] [address] [place], [place] [address]    Report Status 08/19/2021 FINAL  Final  Urine Culture     Status: Abnormal   Collection Time: 08/16/21  9:38 AM   Specimen: Urine, Clean Catch  Result Value Ref Range Status   Specimen Description URINE, CLEAN CATCH  Final   Special Requests NONE  Final   Culture (A)  Final    <10,000 COLONIES/mL INSIGNIFICANT GROWTH Performed at [facility], [place] [address] [place], [place] [address]    Report Status 08/17/2021 FINAL  Final  Culture, blood (routine x 2)     Status: None (Preliminary result)   Collection Time: 08/18/21  9:13 AM   Specimen: BLOOD  Result Value Ref Range Status   Specimen Description BLOOD LEFT ANTECUBITAL  Final   Special Requests   Final    BOTTLES DRAWN AEROBIC AND ANAEROBIC Blood Culture adequate volume   Culture   Final    NO GROWTH < 24 HOURS Performed at [place], [place] [address], [place], [place] [address]    Report Status PENDING  Incomplete  Culture, blood (routine x 2)     Status: None (Preliminary result)   Collection Time: 08/18/21  9:25 AM   Specimen: BLOOD LEFT HAND  Result Value Ref Range Status   Specimen Description BLOOD LEFT HAND  Final   Special Requests   Final    BOTTLES DRAWN AEROBIC AND ANAEROBIC Blood Culture adequate volume   Culture   Final    NO GROWTH < 24 HOURS Performed at [facility], [place] [address], [place], [place] [address]    Report Status PENDING  Incomplete  Aerobic/Anaerobic Culture w Gram Stain (surgical/deep wound)     Status: None (Preliminary result)   Collection Time: 08/18/21  2:57 PM   Specimen: Pleural Fluid  Result Value Ref Range Status   Specimen Description PLEURAL FLUID  Final   Special Requests RIGHT CHEST  Final   Gram Stain   Final    ABUNDANT WBC PRESENT,BOTH PMN AND MONONUCLEAR NO ORGANISMS SEEN Performed at [facility], [place] [address], [place],  [address]    Culture   Final    RARE STREPTOCOCCUS PNEUMONIAE SUSCEPTIBILITIES PERFORMED ON PREVIOUS CULTURE WITHIN THE LAST 5 DAYS. NO ANAEROBES ISOLATED; CULTURE IN PROGRESS FOR 5 DAYS    Report Status PENDING  Incomplete  Aerobic/Anaerobic Culture w Gram Stain (surgical/deep wound)     Status: None (Preliminary result)   Collection Time: 08/18/21  2:58 PM  Specimen: Pleural Fluid  Result Value Ref Range Status   Specimen Description PLEURAL FLUID  Final   Special Requests LEFT CHEST  Final   Gram Stain   Final    ABUNDANT WBC PRESENT,BOTH PMN AND MONONUCLEAR NO ORGANISMS SEEN Performed at [facility], [address] [place], [place] [address]    Culture   Final    FEW STREPTOCOCCUS PNEUMONIAE CRITICAL RESULT CALLED TO, READ BACK BY AND VERIFIED WITH: RN K.[email or web address] ON [id number] AT 1120 BY E.PARRISH NO ANAEROBES ISOLATED; CULTURE IN PROGRESS FOR 5 DAYS    Report Status PENDING  Incomplete   Organism ID,  Bacteria STREPTOCOCCUS PNEUMONIAE  Final      Susceptibility   Streptococcus pneumoniae - MIC*    ERYTHROMYCIN >=8 RESISTANT Resistant     LEVOFLOXACIN 0.5 SENSITIVE Sensitive     VANCOMYCIN <=0.12 SENSITIVE Sensitive     PENICILLIN (meningitis) 0.25 RESISTANT Resistant     PENO - penicillin 0.25      PENICILLIN (non-meningitis) 0.25 SENSITIVE Sensitive     PENICILLIN (oral) 0.25 INTERMEDIATE Intermediate     CEFTRIAXONE (non-meningitis) <=0.12 SENSITIVE Sensitive     CEFTRIAXONE (meningitis) <=0.12 SENSITIVE Sensitive     * FEW STREPTOCOCCUS PNEUMONIAE  Surgical pcr screen     Status: None   Collection Time: 08/20/21  7:56 AM   Specimen: Nasal Mucosa; Nasal Swab  Result Value Ref Range Status   MRSA, PCR NEGATIVE NEGATIVE Final   Staphylococcus aureus NEGATIVE NEGATIVE Final    Comment: (NOTE) The Xpert SA Assay (FDA approved for NASAL specimens in patients 72 years of age and older), is one component of a comprehensive surveillance program. It is not intended to diagnose infection nor to guide or monitor treatment. Performed at [facility], [place] [address], [place], [place] [address]           Radiology Studies: CT chest w/o contrast  Result Date: 08/20/2021 CLINICAL DATA:  Follow-up left empyema, treated with lytic therapy. EXAM: CT CHEST WITHOUT CONTRAST TECHNIQUE: Multidetector CT imaging of the chest was performed following the standard protocol without IV contrast. RADIATION DOSE REDUCTION: This exam was performed according to the departmental dose-optimization program which includes automated exposure control, adjustment of the mA and/or kV according to patient size and/or use of iterative reconstruction technique. COMPARISON:  08/18/2021 FINDINGS: Cardiovascular: No significant vascular findings. Normal heart size. No pericardial effusion. Mediastinum/Nodes: No enlarged mediastinal or axillary lymph nodes. Thyroid gland, trachea, and esophagus demonstrate no  significant findings. Lungs/Pleura: Interval bilateral pleural pigtail catheters. Moderate-sized left pleural effusion with an approximately 50% decrease in size. Interval small amount of interspersed air or gas in the pleural fluid on the left. Small right pleural effusion with an approximately 90% decrease in size. Bilateral lower lobe atelectasis without significant change. Upper Abdomen: Unremarkable. Musculoskeletal: Normal appearing bones. IMPRESSION: 1. Interval bilateral pleural catheters. 2. Moderate left pleural effusion with an approximately 50% decrease in size. 3. Small right pleural effusion with an approximately 90% decrease in size. 4. Interval small amount of air or gas interspersed in the left pleural fluid, most likely introduced by the left pleural catheter. The fact that this is suspended within the fluid indicates a thicker fluid. 5. No significant change in bilateral lower lobe atelectasis Electronically Signed   By: Claudie Revering M.D.   On: 08/20/2021 08:19   DG Chest Port 1 View  Result Date: 08/20/2021 CLINICAL DATA:  Chest tubes. EXAM: PORTABLE CHEST 1 VIEW COMPARISON:  Chest  XR, most recently 08/19/2021. CT chest, 08/18/2021. IR CT, 08/18/2018. FINDINGS: Support lines: Stable positioning of bilateral basilar-directed thoracostomy tubes. Obscured cardiac apex. Hypoinflated lungs. Similar appearance with LEFT basilar consolidation and moderate pleural effusion. Trace RIGHT pleural effusion. No pneumothorax. No interval osseous abnormality. IMPRESSION: 1. Stable positioning of bilateral pigtail thoracostomy tubes. No pneumothorax. 2. Similar LEFT basilar consolidation and moderate pleural effusion. 3. Trace RIGHT pleural effusion Electronically Signed   By: Michaelle Birks M.D.   On: 08/20/2021 07:17   DG Chest Port 1 View  Result Date: 08/19/2021 CLINICAL DATA:  Bilateral pleural effusions. EXAM: PORTABLE CHEST 1 VIEW COMPARISON:  Radiograph August 14, 2021 and CT August 18, 2021.  FINDINGS: Pigtail thoracostomy is in the bilateral lung bases. Similar size of the small moderate right with slightly decreased size of the moderate left pleural effusions. Adjacent airspace opacities are similar. The heart size and mediastinal contours are unchanged. The visualized skeletal structures are stable. IMPRESSION: Similar size of the small-moderate right pleural effusion with slightly decreased size of the moderate left pleural effusion and stable adjacent opacities which may reflect atelectasis or infection. Electronically Signed   By: Dahlia Bailiff M.D.   On: 08/19/2021 08:17   CT [facility] PLEURAL DRAIN W/INDWELL CATH W/IMG GUIDE  Result Date: 08/18/2021 INDICATION: Bilateral loculated pleural effusions.  Question empyema. EXAM: CT PERC PLEURAL DRAIN W/INDWELL CATH W/IMG GUIDE Procedures: CT-GUIDED BILATERAL PIGTAIL THORACOSTOMY TUBE PLACEMENT RADIATION DOSE REDUCTION: This exam was performed according to the departmental dose-optimization program which includes automated exposure control, adjustment of the mA and/or kV according to patient size and/or use of iterative reconstruction technique. COMPARISON:  CT chest, earlier same day. MEDICATIONS: The patient is currently admitted to the hospital and receiving intravenous antibiotics. The antibiotics were administered within an appropriate time frame prior to the initiation of the procedure. ANESTHESIA/SEDATION: Local anesthetic and single agent sedation was employed during this procedure. A total of Versed 4 mg was administered intravenously. The patient's level of consciousness and vital signs were monitored continuously by radiology nursing throughout the procedure under my direct supervision. CONTRAST:  None COMPLICATIONS: None immediate. PROCEDURE: Informed written consent was obtained from the patient and/or patient's representative after a discussion of the risks, benefits and alternatives to treatment. The patient was placed supine on the CT  gantry and a pre procedural CT was performed re-demonstrating the bilateral pleural collections. The procedure was planned. A timeout was performed prior to the initiation of the procedure. The RIGHT and LEFT chest was prepped and draped in the usual sterile fashion. The procedure began in the patient's LEFT side. The overlying soft tissues were anesthetized with 1% lidocaine with epinephrine. Appropriate trajectory was planned with the use of a 22 gauge spinal needle. An 18 gauge trocar needle was advanced into the pleural collection and a short Amplatz super stiff wire was coiled within the collection. Appropriate positioning was confirmed with a limited CT scan. The tract was serially dilated allowing placement of a 12 [place] all-purpose drainage catheter. Upon confirmation of the patient's stable vital signs, the procedure was repeated as above on the patient's RIGHT side. Following bilateral pleural drainage catheter placement, appropriate positioning was confirmed with a limited postprocedural CT scan. Approximally 10 mL of milky pleural fluid was aspirated from each drain and submitted for microbiological analysis. The tubes were connected to pleura vacs on negative suction and sutured in place. Dressings were placed. The patient tolerated the procedure well without immediate post procedural complication. IMPRESSION: Successful CT-guided placement of bilateral  12 Fr pleural drainage catheters into the posterior dependent portions of the loculated pleural collections. Samples were sent to the laboratory as requested by the ordering clinical team. Michaelle Birks, MD [facility] Radiology Electronically Signed   By: Michaelle Birks M.D.   On: 08/18/2021 19:49   CT [facility] PLEURAL DRAIN W/INDWELL CATH W/IMG GUIDE  Result Date: 08/18/2021 INDICATION: Bilateral loculated pleural effusions.  Question empyema. EXAM: CT PERC PLEURAL DRAIN W/INDWELL CATH W/IMG GUIDE  Procedures: CT-GUIDED BILATERAL PIGTAIL THORACOSTOMY TUBE PLACEMENT RADIATION DOSE REDUCTION: This exam was performed according to the departmental dose-optimization program which includes automated exposure control, adjustment of the mA and/or kV according to patient size and/or use of iterative reconstruction technique. COMPARISON:  CT chest, earlier same day. MEDICATIONS: The patient is currently admitted to the hospital and receiving intravenous antibiotics. The antibiotics were administered within an appropriate time frame prior to the initiation of the procedure. ANESTHESIA/SEDATION: Local anesthetic and single agent sedation was employed during this procedure. A total of Versed 4 mg was administered intravenously. The patient's level of consciousness and vital signs were monitored continuously by radiology nursing throughout the procedure under my direct supervision. CONTRAST:  None COMPLICATIONS: None immediate. PROCEDURE: Informed written consent was obtained from the patient and/or patient's representative after a discussion of the risks, benefits and alternatives to treatment. The patient was placed supine on the CT gantry and a pre procedural CT was performed re-demonstrating the bilateral pleural collections. The procedure was planned. A timeout was performed prior to the initiation of the procedure. The RIGHT and LEFT chest was prepped and draped in the usual sterile fashion. The procedure began in the patient's LEFT side. The overlying soft tissues were anesthetized with 1% lidocaine with epinephrine. Appropriate trajectory was planned with the use of a 22 gauge spinal needle. An 18 gauge trocar needle was advanced into the pleural collection and a short Amplatz super stiff wire was coiled within the collection. Appropriate positioning was confirmed with a limited CT scan. The tract was serially dilated allowing placement of a 12 [place] all-purpose drainage catheter. Upon confirmation of the patient's  stable vital signs, the procedure was repeated as above on the patient's RIGHT side. Following bilateral pleural drainage catheter placement, appropriate positioning was confirmed with a limited postprocedural CT scan. Approximally 10 mL of milky pleural fluid was aspirated from each drain and submitted for microbiological analysis. The tubes were connected to pleura vacs on negative suction and sutured in place. Dressings were placed. The patient tolerated the procedure well without immediate post procedural complication. IMPRESSION: Successful CT-guided placement of bilateral 12 Fr pleural drainage catheters into the posterior dependent portions of the loculated pleural collections. Samples were sent to the laboratory as requested by the ordering clinical team. Michaelle Birks, MD [facility] Radiology Electronically Signed   By: Michaelle Birks M.D.   On: 08/18/2021 19:49        Scheduled Meds:  sodium chloride   Intravenous Once   [MAR Hold] atorvastatin  80 mg Oral Daily   [MAR Hold] darbepoetin (ARANESP) injection - NON-DIALYSIS  100 mcg Subcutaneous Q Thu-1800   [MAR Hold] ezetimibe  10 mg Oral Daily   fentaNYL       fentaNYL   Intravenous Q4H   [MAR Hold] furosemide  80 mg Intravenous Q6H   [MAR Hold] pantoprazole  40 mg Oral Daily   [MAR Hold] sodium chloride flush  10 mL Other Q8H   [MAR Hold]  sodium chloride flush  10 mL Other Q8H   vancomycin 1000 mg in NS (1000 ml) irrigation for Dr. Roxy Manns case   Irrigation To OR   Continuous Infusions:  acetaminophen     [MAR Hold] albumin human     bupivacaine 0.5 % ON-Q pump SINGLE CATH 400 mL     [MAR Hold] piperacillin-tazobactam (ZOSYN)  IV 3.375 g (08/20/21 0833)     LOS: 6 days    Time spent: 35 minutes.     Elmarie Shiley, MD [facility]   If 7PM-7AM, please contact night-coverage [email or web address]  08/20/2021, 12:36 PM

## 2021-08-20 NOTE — Progress Notes (Signed)
Pre Procedure note for inpatients:   Philip Trevino has been scheduled for Procedure(s): VIDEO ASSISTED THORACOSCOPY (VATS)/EMPYEMA (Left) today. The various methods of treatment have been discussed with the patient. After consideration of the risks, benefits and treatment options the patient has consented to the planned procedure.   The patient has been seen and labs reviewed. There are no changes in the patients condition to prevent proceeding with the planned procedure today.  Recent labs:  Lab Results  Component Value Date   WBC 13.3 (H) 08/20/2021   HGB 8.3 (L) 08/20/2021   HCT 25.8 (L) 08/20/2021   PLT 860 (H) 08/20/2021   GLUCOSE 110 (H) 08/20/2021   CHOL 155 08/15/2021   TRIG 121 08/15/2021   HDL 18 (L) 08/15/2021   LDLCALC 113 (H) 08/15/2021   ALT 12 08/14/2021   AST 33 08/14/2021   NA 137 08/20/2021   K 4.6 08/20/2021   CL 107 08/20/2021   CREATININE 5.37 (H) 08/20/2021   BUN 62 (H) 08/20/2021   CO2 22 08/20/2021   TSH 3.916 01/28/2020   INR 1.3 (H) 08/18/2021    Dahlia Byes, MD 08/20/2021 8:18 AM

## 2021-08-20 NOTE — Anesthesia Procedure Notes (Signed)
Procedure Name: Intubation Date/Time: 08/20/2021 9:41 AM Performed by: Leonor Liv, CRNA Pre-anesthesia Checklist: Patient identified, Emergency Drugs available, Suction available and Patient being monitored Patient Re-evaluated:Patient Re-evaluated prior to induction Oxygen Delivery Method: Circle System Utilized Preoxygenation: Pre-oxygenation with 100% oxygen Induction Type: IV induction Ventilation: Mask ventilation without difficulty Laryngoscope Size: Mac and 4 Grade View: Grade I Tube type: Oral Endobronchial tube: Left, Double lumen EBT and EBT position confirmed by fiberoptic bronchoscope and 39 Fr Number of attempts: 1 Airway Equipment and Method: Rigid stylet Placement Confirmation: ETT inserted through vocal cords under direct vision, positive ETCO2 and breath sounds checked- equal and bilateral Secured at: 31 cm Tube secured with: Tape Dental Injury: Teeth and Oropharynx as per pre-operative assessment

## 2021-08-20 NOTE — Brief Op Note (Addendum)
08/14/2021 - 08/20/2021  11:24 AM  PATIENT:  Philip Trevino  26 y.o. male  PRE-OPERATIVE DIAGNOSIS:  Left empyema  POST-OPERATIVE DIAGNOSIS:  Left empyema  PROCEDURE:  LEFT VIDEO ASSISTED THORACOSCOPY (VATS), MINI LEFT THORACOTOMY for DRAINAGE of LEFT EMPYEMA  Findings: Loculated purulent fluid and solid material removed with decortication of LLL pleural membrane and vanco irrigation of pleural space  SURGEON:  Surgeon(s) and Role:    Dahlia Byes, MD - Primary  PHYSICIAN ASSISTANT: Lars Pinks PA-C  ANESTHESIA:   general  EBL:  100 mL   BLOOD ADMINISTERED: One CC PRBC  DRAIN:28 Blake drain placed in the left pleural space  LOCAL MEDICATIONS USED:  Bupivacaine   SPECIMEN:  Source of Specimen:  Left pleural fluid  DISPOSITION OF SPECIMEN:   Gram stain, culture, AFB and fungal  COUNTS CORRECT:  YES  DICTATION: .Dragon Dictation  PLAN OF CARE: Admit to inpatient   PATIENT DISPOSITION:  PACU - hemodynamically stable.   Delay start of Pharmacological VTE agent (>24hrs) due to surgical blood loss or risk of bleeding: no

## 2021-08-20 NOTE — Progress Notes (Signed)
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  He reports a cough minimally productive of clear sputum and bilateral chest pain when he would cough or take deep breaths.  Reports preceding URI symptoms and tested negative for COVID and flu x2, no sick contacts.  He tried marijuana to increase his appetite.  Initial labs showed BUN/creatinine of 76/6.4, baseline noted to be 2.2 about 3 weeks ago.  WBC count was 20 5K with hemoglobin of 8.7 Chest x-ray showed bibasal effusions  He was initially given IV fluids then developed fever, urine culture showed less than 10K organisms, started ceftriaxone and azithromycin to cover UTI and pneumonia.  HIV negative.  CT chest without contrast 1/28 showed large left and moderate right, partially loculated pleural effusions.  Mass effect on underlying lung with passive atelectasis Hence PCCM consulted  He states that he has not seen a nephrologist over the past year since he was "bouncing around"  Significant tests/ events reviewed 1/28 CT-guided bilateral chest tubes   PAST MEDICAL HISTORY :  Past Medical History:  Diagnosis Date   Nephrotic syndrome    Past Surgical History:  Procedure Laterality Date   IR FLUORO GUIDE CV LINE RIGHT  01/31/2020   IR US GUIDE VASC ACCESS RIGHT  01/31/2020   TONSILLECTOMY      SUBJECTIVE:  Left-sided VATS this morning, going to ICU for observation after. R sided chest tube remains with purulent output, improved on CT scan. PCCM asked to remain involved by primary team.   VITAL SIGNS: Temp:  [97.4 F (36.3 C)-99.1 F (37.3 C)] 97.6 F (36.4 C)  (01/30 1155) Pulse Rate:  [92-111] 103 (01/30 1155) Resp:  [18-28] 28 (01/30 1210) BP: (123-152)/(87-103) 150/99 (01/30 1210) SpO2:  [96 %-100 %] 100 % (01/30 1225) Arterial Line BP: (150-159)/(94-95) 159/95 (01/30 1210)  PHYSICAL EXAMINATION: Gen. Ill appearing man lying in bed in NAD HENT - [place]/AT Lungs: shallow breathing with tachypnea, purulent bloody drainage from R chest tube, serosanguinous output from left chest tube. Tidaling but no air leak. Cardiovascular: S1S2, RRR Abdomen: soft, TTP Musculoskeletal: no c/c/e Neuro: Arousable to stimulation, very sleepy. Moaning but answering with one word answers with stimulation. Skin:  warm, no rashes.  CT chest personally reviewed> small residual pleural effusion with chest tube in appropriate position, resolving RLL infiltrate. Some loculated fluid in R major fissure. L effusion persistent.  Bilateral pleural fluid> strep pneumoniae Blood cx NGTD  WBC 13.3 H/H 8.3/25.8 Platelets 860 BUN 62 Cr 5.37  ASSESSMENT / PLAN: Acute respiratory failure with hypoxia Acute bilateral lower lobe pneumonia Bilateral empyema-- pneumococcus. 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 Appreciate TCTS management for source control on the left.  Repeat cultures from OR pending. -R side improved with lytics. Monitor output and daily CXR. Based on today's CT scan, not sure that additional doses are going to resolve this further. If worsening on CXR, may need to restart these medications.  -Con't antibiotics. He will need a  prolonged course with the effusion trapped in the fissure. Pleural lytics will not address this.  -Supplemental O2 as required to maintain SpO2 >90% -Pain control per TCTS-- PCA  AKI/FSGS -management per nephrology  Anemia -transfuse  for Hb<7 or hemodynamically significant bleeding -monitor  This patient is critically ill with multiple organ system failure which requires frequent high complexity decision making, assessment, support, evaluation, and titration of therapies. This was completed through the application of advanced monitoring technologies and extensive interpretation of multiple databases. During this encounter critical care time was devoted to patient care services described in this note for 36 minutes.   Julian Hy, DO 08/20/21 1:23 PM [facility]

## 2021-08-20 NOTE — Anesthesia Procedure Notes (Signed)
Arterial Line Insertion Start/End1/30/2023 9:42 AM, 08/20/2021 9:50 AM Performed by: Betha Loa, CRNA  Patient location: OR. Preanesthetic checklist: patient identified, IV checked, site marked, risks and benefits discussed, surgical consent, monitors and equipment checked, pre-op evaluation, timeout performed and anesthesia consent Patient sedated Left, radial was placed Catheter size: 20 G Hand hygiene performed  and maximum sterile barriers used  Allen's test indicative of satisfactory collateral circulation Attempts: 1 Procedure performed without using ultrasound guided technique. Following insertion, dressing applied and Biopatch. Post procedure assessment: normal  Patient tolerated the procedure well with no immediate complications. Additional procedure comments: Placed in OR under anesthesia.

## 2021-08-20 NOTE — Progress Notes (Signed)
Transported to radiology for CT of the chest. From there went to peri op.

## 2021-08-20 NOTE — Hospital Course (Signed)
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  He reports a cough minimally productive of clear sputum and bilateral chest pain when he would cough or take deep breaths.  Reports preceding URI symptoms and tested negative for COVID and flu x2, no sick contacts.  He tried marijuana to increase his appetite.   Initial labs showed BUN/creatinine of 76/6.4, baseline noted to be 2.2 about 3 weeks ago.  WBC count was 20 5K with hemoglobin of 8.7 Chest x-ray showed bibasal effusions   He was initially given IV fluids then developed fever, urine culture showed less than 10K organisms, started ceftriaxone and azithromycin to cover UTI and pneumonia.  HIV negative.  CT chest without contrast 1/28 showed large left and moderate right, partially loculated pleural effusions.  Mass effect on underlying lung with passive atelectasis. Dr. Elsworth Soho and Dr. Prescott Gum agree that a larger chest tube needs to be placed with surgical decortication due to the very purulent nature of the material. Dr. Prescott Gum discussed the need for left VATS, drainage of empyema. Potential risks, benefits, and complications of the surgery were discussed with the patient and he agreed to proceed with surgery.  Hospital Course:

## 2021-08-20 NOTE — Transfer of Care (Signed)
Immediate Anesthesia Transfer of Care Note  Patient: Philip Trevino  Procedure(s) Performed: VIDEO ASSISTED THORACOSCOPY (VATS)/EMPYEMA (Left: Chest)  Patient Location: PACU  Anesthesia Type:General  Level of Consciousness: drowsy  Airway & Oxygen Therapy: Patient Spontanous Breathing and Patient connected to face mask oxygen  Post-op Assessment: Report given to RN, Post -op Vital signs reviewed and stable and Patient moving all extremities  Post vital signs: Reviewed and stable  Last Vitals:  Vitals Value Taken Time  BP 147/103 08/20/21 1152  Temp    Pulse 94 08/20/21 1158  Resp 24 08/20/21 1158  SpO2 100 % 08/20/21 1158  Vitals shown include unvalidated device data.  Last Pain:  Vitals:   08/20/21 0828  TempSrc: Oral  PainSc:       Patients Stated Pain Goal: 5 (16/10/96 0454)  Complications: No notable events documented.

## 2021-08-20 NOTE — Anesthesia Postprocedure Evaluation (Signed)
Anesthesia Post Note  Patient: Philip Trevino  Procedure(s) Performed: VIDEO ASSISTED THORACOSCOPY (VATS)/EMPYEMA (Left: Chest)     Patient location during evaluation: PACU Anesthesia Type: General Level of consciousness: awake Pain management: pain level controlled Vital Signs Assessment: post-procedure vital signs reviewed and stable Respiratory status: spontaneous breathing, nonlabored ventilation, respiratory function stable and patient connected to nasal cannula oxygen Cardiovascular status: blood pressure returned to baseline and stable Postop Assessment: no apparent nausea or vomiting Anesthetic complications: no   No notable events documented.  Last Vitals:  Vitals:   08/20/21 2000 08/20/21 2100  BP:    Pulse: 99 97  Resp: 20 (!) 23  Temp:    SpO2: 95% 96%    Last Pain:  Vitals:   08/20/21 2000  TempSrc:   PainSc: 2                  Chick Cousins P Azelea Seguin

## 2021-08-21 ENCOUNTER — Inpatient Hospital Stay (HOSPITAL_COMMUNITY): Payer: Medicaid Other

## 2021-08-21 ENCOUNTER — Encounter (HOSPITAL_COMMUNITY): Payer: Self-pay | Admitting: Cardiothoracic Surgery

## 2021-08-21 DIAGNOSIS — J90 Pleural effusion, not elsewhere classified: Secondary | ICD-10-CM | POA: Diagnosis not present

## 2021-08-21 DIAGNOSIS — N179 Acute kidney failure, unspecified: Secondary | ICD-10-CM | POA: Diagnosis not present

## 2021-08-21 DIAGNOSIS — J869 Pyothorax without fistula: Secondary | ICD-10-CM | POA: Diagnosis not present

## 2021-08-21 LAB — POCT I-STAT 7, (LYTES, BLD GAS, ICA,H+H)
Acid-Base Excess: 0 mmol/L (ref 0.0–2.0)
Bicarbonate: 24.4 mmol/L (ref 20.0–28.0)
Calcium, Ion: 1.01 mmol/L — ABNORMAL LOW (ref 1.15–1.40)
HCT: 26 % — ABNORMAL LOW (ref 39.0–52.0)
Hemoglobin: 8.8 g/dL — ABNORMAL LOW (ref 13.0–17.0)
O2 Saturation: 98 %
Patient temperature: 97.6
Potassium: 5 mmol/L (ref 3.5–5.1)
Sodium: 139 mmol/L (ref 135–145)
TCO2: 26 mmol/L (ref 22–32)
pCO2 arterial: 38 mmHg (ref 32.0–48.0)
pH, Arterial: 7.413 (ref 7.350–7.450)
pO2, Arterial: 102 mmHg (ref 83.0–108.0)

## 2021-08-21 LAB — CBC
HCT: 27.7 % — ABNORMAL LOW (ref 39.0–52.0)
Hemoglobin: 9.2 g/dL — ABNORMAL LOW (ref 13.0–17.0)
MCH: 29.1 pg (ref 26.0–34.0)
MCHC: 33.2 g/dL (ref 30.0–36.0)
MCV: 87.7 fL (ref 80.0–100.0)
Platelets: 826 10*3/uL — ABNORMAL HIGH (ref 150–400)
RBC: 3.16 MIL/uL — ABNORMAL LOW (ref 4.22–5.81)
RDW: 12.3 % (ref 11.5–15.5)
WBC: 20.2 10*3/uL — ABNORMAL HIGH (ref 4.0–10.5)
nRBC: 0 % (ref 0.0–0.2)

## 2021-08-21 LAB — RENAL FUNCTION PANEL
Albumin: <1.5 g/dL — ABNORMAL LOW (ref 3.5–5.0)
Anion gap: 10 (ref 5–15)
BUN: 58 mg/dL — ABNORMAL HIGH (ref 6–20)
CO2: 21 mmol/L — ABNORMAL LOW (ref 22–32)
Calcium: 7.4 mg/dL — ABNORMAL LOW (ref 8.9–10.3)
Chloride: 106 mmol/L (ref 98–111)
Creatinine, Ser: 5.09 mg/dL — ABNORMAL HIGH (ref 0.61–1.24)
GFR, Estimated: 15 mL/min — ABNORMAL LOW (ref >60)
Glucose, Bld: 127 mg/dL — ABNORMAL HIGH (ref 70–99)
Phosphorus: 9.4 mg/dL — ABNORMAL HIGH (ref 2.5–4.6)
Potassium: 4.7 mmol/L (ref 3.5–5.1)
Sodium: 137 mmol/L (ref 135–145)

## 2021-08-21 LAB — CYTOLOGY - NON PAP

## 2021-08-21 MED ORDER — METRONIDAZOLE 500 MG/100ML IV SOLN
500.0000 mg | Freq: Two times a day (BID) | INTRAVENOUS | Status: DC
  Administered 2021-08-21 – 2021-08-23 (×4): 500 mg via INTRAVENOUS
  Filled 2021-08-21 (×4): qty 100

## 2021-08-21 MED ORDER — ALBUMIN HUMAN 25 % IV SOLN
25.0000 g | Freq: Four times a day (QID) | INTRAVENOUS | Status: AC
  Administered 2021-08-21 – 2021-08-22 (×4): 25 g via INTRAVENOUS
  Filled 2021-08-21 (×4): qty 100

## 2021-08-21 MED ORDER — SODIUM CHLORIDE 0.9 % IV SOLN
2.0000 g | INTRAVENOUS | Status: DC
  Administered 2021-08-21 – 2021-08-22 (×2): 2 g via INTRAVENOUS
  Filled 2021-08-21 (×3): qty 20

## 2021-08-21 NOTE — Op Note (Signed)
Philip Trevino, [facility] MEDICAL RECORD NO: [id number] ACCOUNT NO: [id number] DATE OF BIRTH: 25-Sep-1995 FACILITY: [facility] LOCATION: [facility] PHYSICIAN: Ivin Poot III, MD  Operative Report   DATE OF PROCEDURE: 08/20/2021  OPERATION: 1.  Left VATS (video-assisted thoracic surgery) with drainage of left empyema and decortication of the left lower lobe. 2.  Placement of On-Q wound analgesia system.  PREOPERATIVE DIAGNOSES:   POSTOPERATIVE DIAGNOSIS:  Left empyema with pleural fluid growing Streptococcus pneumoniae with history of nephrotic syndrome and chronic stage IV kidney disease.  SURGEON:  Len Childs, MD .  ASSISTANT:  Lars Pinks, PA-C.  A surgical first assistant was required for this operation to assist with exposure, suctioning, manipulation of the videoscope and general surgical assistance.  ANESTHESIA:  General.  CLINICAL NOTE:  The patient is a 26 year old who was admitted to the Pulmonary Service with shortness of breath and bilateral pneumonia with a history of nephrotic syndrome, who has been lost from medical care.  Radiographic studies showed significant  bilateral effusions consistent with empyema.  Interventional radiology placed pigtail catheters under CT guidance in each pleural space.  The right sided catheter had a good result and drainage.  The left sided catheter drained purulent material, but  without significant improvement in x-ray.  After injection of lytic therapy through the left sided catheter, there was still no improvement in the CT scan, which was performed just prior to surgery, which I felt was a strong indication to proceed with  surgical drainage of the empyema.  I discussed the procedure in detail with the patient including the use of general anesthesia, the location of the surgical incisions, the expected postoperative recovery with chest tube drains and the risks of further  infection, bleeding, pain, and organ failure.  He demonstrated  his understanding and agreed to proceed.  DESCRIPTION OF PROCEDURE:  The patient was brought to the operating room and placed supine on the operating table.  General anesthesia was induced and the patient was intubated with a double lumen endotracheal tube.  The patient was turned left side up  and the previously placed left pigtail catheter was removed.  The left chest was prepped and draped as a sterile field.  A proper time-out was performed.  A small incision was made in the fifth interspace anterior to the tip of the scapula.  The port and  then the camera were inserted.  Visualization was poor.  I placed a suction catheter and drained 750 mL of material.  This was purulent.  This was sent for cultures including routine aerobic, anaerobic and AFB and fungal.  I placed the scope in again  and had somewhat better visualization, but the incision was slightly extended in order to fully evaluate the pleural space.  There was large amount of solid material of fibrin white cells, protein and bacteria and this was removed.  The empyema cavity  was irrigated with a liter of warm vancomycin irrigation.  The pleural peel on the lower lobe was excised off the visceral pleura.  The field was then irrigated again with antibiotic irrigation and a 28-French Bard catheter was placed in the space  between the diaphragm and the lower lobe.  This was brought out through a separate incision and secured.  The ribs were reapproximated with 1 Vicryl suture.  The muscle was closed in layers using interrupted Vicryl and the subcutaneous and skin layers were closed with running Vicryl.  A PleurX catheter was placed beneath the incision above the chest  tube  site, brought out anteriorly and connected to a Marcaine reservoir, 0.5% Marcaine.  Sterile dressings were applied.  The patient was then turned supine.  He was extubated in the operating room and remained stable.  There was no significant air leak.  He  returned to the  recovery room.  A chest x-ray there showed the chest tube in good position with good clearance of the empyema.        PAA D: 08/20/2021 4:38:41 pm T: 08/21/2021 3:37:00 am  JOB: [id number]/ [id number]

## 2021-08-21 NOTE — Progress Notes (Signed)
Subjective:   Patient feeling much better today.  Less urine output with 1 L but creatinine improved to 5.1.  Some chest pain.  Less confusion.  Never received any of the albumin that was ordered.  Objective Vital signs in last 24 hours: Vitals:   08/21/21 0700 08/21/21 0730 08/21/21 0800 08/21/21 0814  BP:    (!) 134/104  Pulse: (!) 102 86 83 77  Resp: (!) 26 (!) 21 13 20   Temp:      TempSrc:      SpO2: 99% 95% 96% 97%  Weight:      Height:       Weight change:   Intake/Output Summary (Last 24 hours) at 08/21/2021 4098 Last data filed at 08/21/2021 0800 Gross per 24 hour  Intake 1668.33 ml  Output 2380 ml  Net -711.67 ml     Assessment/Plan:25 ear old BM with history of FSG-  course complicated by frequent flaring and medication non compliance-  now with A on CRF and likely disease flaring  1.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  Creatinine was normal and in November 2022.  15 days prior to admission creatinine was around 2.2.  Felt that his acute kidney injury here with creatinine up to 6 was more likely ATN on top of his FSGS given the background of infection.  He has been treated with steroids in the past.    It is likely patient is going to need alternative immunotherapy after everything is said and done.  Not excited about a CNI right now given his active AKI.  Rituximab could be considered but would want to adequately treat his significant infection first.  Trying to avoid excessive steroids. -Continue to monitor creatinine daily -I received albumin yesterday, reordered today -Continue treatment of infection -Consider further immunosuppression based on response   2. Hypertension/volume  -significant edema.  IV albumin.  Continue IV Lasix 3.  Appetite sxms-possibly some degree of uremia but also GERD and abdominal edema. 4. Anemia  - indicates long term CKD vs GIB possibly due to long term steroids -   have put him on protonix -  and give dose of ESA -  stable  5.  Elevated WBC/bilateral empyema-  fever. Leukopenia and pyuria-initially treated for UTI now with concern of bilateral empyema.  Appreciate primary and consultant management.  Status post thoracotomy and bilateral chest tubes.  Continue antibiotics per primary team     Reesa Chew    Labs: Basic Metabolic Panel: Recent Labs  Lab 08/19/21 0121 08/20/21 0051 08/21/21 0327 08/21/21 0328  NA 134* 137 139 137  K 4.8 4.6 5.0 4.7  CL 104 107  --  106  CO2 20* 22  --  21*  GLUCOSE 114* 110*  --  127*  BUN 70* 62*  --  58*  CREATININE 5.79* 5.37*  --  5.09*  CALCIUM 7.0* 7.4*  --  7.4*  PHOS 9.0* 9.4*  --  9.4*   Liver Function Tests: Recent Labs  Lab 08/14/21 0938 08/16/21 0256 08/19/21 0121 08/20/21 0051 08/21/21 0328  AST 33  --   --   --   --   ALT 12  --   --   --   --   ALKPHOS 79  --   --   --   --   BILITOT 0.2*  --   --   --   --   PROT 5.9*  --   --   --   --  ALBUMIN <1.5*   < > <1.5* <1.5* <1.5*   < > = values in this interval not displayed.   Recent Labs  Lab 08/14/21 0938  LIPASE 45   No results for input(s): AMMONIA in the last 168 hours. CBC: Recent Labs  Lab 08/14/21 0938 08/15/21 0925 08/16/21 0256 08/17/21 0140 08/18/21 0157 08/19/21 0121 08/20/21 0051 08/21/21 0327 08/21/21 0328  WBC 25.0*   < > 24.8* 24.8* 24.4* 18.1* 13.3*  --  20.2*  NEUTROABS 21.1*  --  21.0*  --   --   --   --   --   --   HGB 8.7*   < > 8.7* 9.0* 8.2* 9.1* 8.3* 8.8* 9.2*  HCT 26.4*   < > 26.4* 26.4* 24.3* 26.9* 25.8* 26.0* 27.7*  MCV 88.6   < > 87.4 87.1 87.1 86.8 87.8  --  87.7  PLT 1,082*   < > 908* 963* 788* 871* 860*  --  826*   < > = values in this interval not displayed.   Cardiac Enzymes: No results for input(s): CKTOTAL, CKMB, CKMBINDEX, TROPONINI in the last 168 hours. CBG: No results for input(s): GLUCAP in the last 168 hours.  Iron Studies: No results for input(s): IRON, TIBC,  TRANSFERRIN, FERRITIN in the last 72 hours. Studies/Results: CT chest w/o contrast  Result Date: 08/20/2021 CLINICAL DATA:  Follow-up left empyema, treated with lytic therapy. EXAM: CT CHEST WITHOUT CONTRAST TECHNIQUE: Multidetector CT imaging of the chest was performed following the standard protocol without IV contrast. RADIATION DOSE REDUCTION: This exam was performed according to the departmental dose-optimization program which includes automated exposure control, adjustment of the mA and/or kV according to patient size and/or use of iterative reconstruction technique. COMPARISON:  08/18/2021 FINDINGS: Cardiovascular: No significant vascular findings. Normal heart size. No pericardial effusion. Mediastinum/Nodes: No enlarged mediastinal or axillary lymph nodes. Thyroid gland, trachea, and esophagus demonstrate no significant findings. Lungs/Pleura: Interval bilateral pleural pigtail catheters. Moderate-sized left pleural effusion with an approximately 50% decrease in size. Interval small amount of interspersed air or gas in the pleural fluid on the left. Small right pleural effusion with an approximately 90% decrease in size. Bilateral lower lobe atelectasis without significant change. Upper Abdomen: Unremarkable. Musculoskeletal: Normal appearing bones. IMPRESSION: 1. Interval bilateral pleural catheters. 2. Moderate left pleural effusion with an approximately 50% decrease in size. 3. Small right pleural effusion with an approximately 90% decrease in size. 4. Interval small amount of air or gas interspersed in the left pleural fluid, most likely introduced by the left pleural catheter. The fact that this is suspended within the fluid indicates a thicker fluid. 5. No significant change in bilateral lower lobe atelectasis Electronically Signed   By: Claudie Revering M.D.   On: 08/20/2021 08:19   DG Chest Port 1 View  Result Date: 08/21/2021 CLINICAL DATA:  Pneumothorax EXAM: PORTABLE CHEST 1 VIEW COMPARISON:   Previous studies including the examination of 08/20/2021 FINDINGS: Transverse diameter of heart is increased. Central pulmonary vessels are prominent. There is a pigtail right chest tube with its tip in the medial right lower lung fields. There is a larger caliber left chest tube with its tip in the medial left mid lung fields. There is small loculated pneumothorax near the left lateral costophrenic angle. There is no demonstrable apical pneumothorax. Subcutaneous emphysema is seen in the left chest wall. There are patchy infiltrates in both lower lung fields, more so on the left side. IMPRESSION: Small loculated left pneumothorax is seen in  the lateral aspect of left lower lung fields. Small left pleural effusion. There are patchy infiltrates in both lower lung fields suggesting atelectasis/pneumonia. Electronically Signed   By: Elmer Picker M.D.   On: 08/21/2021 08:02   DG Chest Port 1 View  Result Date: 08/20/2021 CLINICAL DATA:  Pleural effusions EXAM: PORTABLE CHEST 1 VIEW COMPARISON:  Previous studies including the examination done earlier today FINDINGS: Transverse diameter of heart is increased. No significant changes seen in the right pleural pigtail catheter. There is interval improvement in aeration of right parahilar region suggesting decrease in effusion in the interlobar fissure. There is interval replacement of pigtail left chest tube with a larger caliber chest tube. There is improvement in aeration of left lower lung fields suggesting significant decrease in left pleural effusion. There is small loculated pneumothorax in the lateral aspect of left lower lung fields. There is no definite left apical pneumothorax. Subcutaneous emphysema is seen in the left chest wall. IMPRESSION: There is significant interval decrease in the left pleural effusion after placement of larger caliber left chest tube. There is a small residual left pleural effusion and small loculated pneumothorax in the lateral  aspect of left lower lung fields. Electronically Signed   By: Elmer Picker M.D.   On: 08/20/2021 13:40   DG Chest Port 1 View  Result Date: 08/20/2021 CLINICAL DATA:  Chest tubes. EXAM: PORTABLE CHEST 1 VIEW COMPARISON:  Chest XR, most recently 08/19/2021. CT chest, 08/18/2021. IR CT, 08/18/2018. FINDINGS: Support lines: Stable positioning of bilateral basilar-directed thoracostomy tubes. Obscured cardiac apex. Hypoinflated lungs. Similar appearance with LEFT basilar consolidation and moderate pleural effusion. Trace RIGHT pleural effusion. No pneumothorax. No interval osseous abnormality. IMPRESSION: 1. Stable positioning of bilateral pigtail thoracostomy tubes. No pneumothorax. 2. Similar LEFT basilar consolidation and moderate pleural effusion. 3. Trace RIGHT pleural effusion Electronically Signed   By: Michaelle Birks M.D.   On: 08/20/2021 07:17   Medications: Infusions:  albumin human 60 mL/hr at 08/21/21 0800   piperacillin-tazobactam (ZOSYN)  IV Stopped (08/21/21 1829)     Scheduled Medications:  acetaminophen  1,000 mg Oral Q6H   Or   acetaminophen (TYLENOL) oral liquid 160 mg/5 mL  1,000 mg Oral Q6H   atorvastatin  80 mg Oral Daily   Chlorhexidine Gluconate Cloth  6 each Topical Q0600   darbepoetin (ARANESP) injection - NON-DIALYSIS  100 mcg Subcutaneous Q Thu-1800   ezetimibe  10 mg Oral Daily   furosemide  80 mg Intravenous Q6H   pantoprazole  40 mg Oral Daily   senna-docusate  1 tablet Oral QHS    have reviewed scheduled and prn medications.  Physical Exam: General: f lying in bed, in pain, mild distress Heart: Tachycardia, regular rhythm Lungs: Bilateral chest rise, short quick breathing Abdomen: soft, non tender Extremities: pitting edema, warm and well perfused    08/21/2021,9:16 AM  LOS: 7 days

## 2021-08-21 NOTE — Progress Notes (Signed)
PROGRESS NOTE    Philip Trevino  ZHY:[id number] DOB: 11/24/1995 DOA: 08/14/2021 PCP: Kerin Perna, NP   Brief Narrative: 26 year old past medical history significant for focal segmental glomerulosclerosis, minimal-change disease, hypertension presents with multiple complaints.  Over the last 2 or 3 weeks he noticed loss of taste, fatigue, weakness.  Pleuritic chest pain.  Chest congestion.  Subjective fever.  Multiple episodes of diarrhea.  He was supposed to follow-up with nephrology, but due to transportation issue never follow-up.  Not taking any home medications.  Evaluation in the ED: COVID-negative, BUN 76, creatinine 6.4 albumin less than 1.5, total protein 5.9.  Chest x-ray with bibasilar chest density left greater than the right.  Finding suggestive of pleural effusion compressive atelectasis.    Patient spike fever, WBC continue to be elevated. CT chest showed BL pleural effusion/ loculated. CCM consulted for thoracentesis. CCM performed bedside US which showed bilateral loculated effusion without clear window. Recommendation was for IR chest tube placement.   Left side pleural effusion didn't improved with chest tube or lytics. CVTS consulted, he underwent Left VATS with drainage of left Empyema and decortication of the lower lobe on 1/30.  Assessment & Plan:   Principal Problem:   AKI (acute kidney injury) ([place]) Active Problems:   Anemia, unspecified   Benign essential HTN   Leukocytosis   Hyperlipidemia   Pleural effusion, bilateral   1-AKI in the setting of FSG: Hyponatremia; in setting renal failure -Renal biopsy-proven FSGS with frequent flares but usually associated with medication noncompliance -Normal creatinine October, November, 2 weeks ago creatinine 2.2 presenting with a creatinine over 6. -AKI related to ATN on top of FSGS.  -Appreciate nephrology assistance. -Nephrology considering further alternative agent to treat  FSG. Infection will need to be  resolved/controlled.  -On  IV  lasix. Plan for IV albumin. Renal function stable cr 5.0  Bilateral Empyema:  Patient with Leukocytosis, Fever, BL loculated pleural effusion.  -Chest x ray with atelectasis vs consolidation. -Spike fever 1/27. CT chest 1/28 showed worsening BL pleural effusion left worse than right, loculated, consider empyema.  -CCM consulted, no clear window for safe thoracentesis, concern for empyema. -IR consulted for Chest tube placement. Underwent BL chest tube placement 1/28.  -Pleural fluid consistent with empyema, awaiting triglycerides level.  -Pleural fluid growing streptococcus.  -WBC spike today, suspect post Sx. Follow trend.  -Underwent VATS of Left pleural effusion. Fail pleural fibrinolytic on the left.  -Dr Nils Pyle following.  -Pain management per CVTS.  -Ok to change zosyn to ceftriaxone per CCM. I will also Add flagyl to cover anaerobes.   Hyperlipidemia:  Continue with Zetia and Lipitor  Hypertension:  on Lasix. Hold lisinopril due to AKI.   Anemia /thrombocytosis: On Aranesp.  Hb stable.   Thrombocytosis, leukocytosis; related to infection.   Hypoalbuminemia -Suspect related to hypoalbuminemia, in setting of FSG. -IV albumin ordered.    Elevation of troponin: Likely demand in the setting of renal failure  Diarrhea:  Reported 2 episode of loose stool since yesterday Resolved.   Failure to thrive: Likely in the setting of AKI And infection.  HIV nonreactive     Estimated body mass index is 23.76 kg/m as calculated from the following:   Height as of this encounter: 5\' 10"  (1.778 m).   Weight as of this encounter: 75.1 kg.   DVT prophylaxis: SCDs, Heparin  Code Status: Full code Family Communication: Care discussed with patient Disposition Plan:  Status is: Inpatient  Remains inpatient appropriate because: Management for  acute renal failure    Consultants:  Nephrology  Procedures:    Antimicrobials:     Subjective: He is alert, sitting recliner, feels better, complaints of pain.   Objective: Vitals:   08/21/21 0700 08/21/21 0730 08/21/21 0800 08/21/21 0814  BP:    (!) 134/104  Pulse: (!) 102 86 83 77  Resp: (!) 26 (!) 21 13 20   Temp:      TempSrc:      SpO2: 99% 95% 96% 97%  Weight:      Height:        Intake/Output Summary (Last 24 hours) at 08/21/2021 0834 Last data filed at 08/21/2021 0800 Gross per 24 hour  Intake 1668.33 ml  Output 2380 ml  Net -711.67 ml    Filed Weights   08/15/21 1400 08/21/21 0652  Weight: 78.8 kg 75.1 kg    Examination:  General exam: SAlert Respiratory system: BL ronchus. BL chest tube in place.  Cardiovascular system: S 1, S 2 RRR Gastrointestinal system: BS present, soft, nt Central nervous system: Non focal.  Extremities: No edema   Data Reviewed: I have personally reviewed following labs and imaging studies  CBC: Recent Labs  Lab 08/14/21 0938 08/15/21 0925 08/16/21 0256 08/17/21 0140 08/18/21 0157 08/19/21 0121 08/20/21 0051 08/21/21 0327 08/21/21 0328  WBC 25.0*   < > 24.8* 24.8* 24.4* 18.1* 13.3*  --  20.2*  NEUTROABS 21.1*  --  21.0*  --   --   --   --   --   --   HGB 8.7*   < > 8.7* 9.0* 8.2* 9.1* 8.3* 8.8* 9.2*  HCT 26.4*   < > 26.4* 26.4* 24.3* 26.9* 25.8* 26.0* 27.7*  MCV 88.6   < > 87.4 87.1 87.1 86.8 87.8  --  87.7  PLT 1,082*   < > 908* 963* 788* 871* 860*  --  826*   < > = values in this interval not displayed.    Basic Metabolic Panel: Recent Labs  Lab 08/14/21 0938 08/15/21 0925 08/17/21 0140 08/18/21 0157 08/19/21 0121 08/20/21 0051 08/21/21 0327 08/21/21 0328  NA 133*   < > 130* 133* 134* 137 139 137  K 3.9   < > 4.6 4.8 4.8 4.6 5.0 4.7  CL 102   < > 102 104 104 107  --  106  CO2 21*   < > 18* 19* 20* 22  --  21*  GLUCOSE 100*   < > 102* 122* 114* 110*  --  127*  BUN 76*   < > 75* 75* 70* 62*  --  58*  CREATININE 6.41*   < > 5.91* 6.20* 5.79* 5.37*  --  5.09*  CALCIUM 7.1*   < > 7.0*  7.0* 7.0* 7.4*  --  7.4*  MG 2.7*  --   --   --   --   --   --   --   PHOS  --    < > 9.1* 9.1* 9.0* 9.4*  --  9.4*   < > = values in this interval not displayed.    GFR: Estimated Creatinine Clearance: 23.1 mL/min (A) (by C-G formula based on SCr of 5.09 mg/dL (H)). Liver Function Tests: Recent Labs  Lab 08/14/21 8119 08/16/21 0256 08/17/21 0140 08/18/21 0157 08/19/21 0121 08/20/21 0051 08/21/21 0328  AST 33  --   --   --   --   --   --   ALT 12  --   --   --   --   --   --  ALKPHOS 79  --   --   --   --   --   --   BILITOT 0.2*  --   --   --   --   --   --   PROT 5.9*  --   --   --   --   --   --   ALBUMIN <1.5*   < > <1.5* <1.5* <1.5* <1.5* <1.5*   < > = values in this interval not displayed.    Recent Labs  Lab 08/14/21 0938  LIPASE 45    No results for input(s): AMMONIA in the last 168 hours. Coagulation Profile: Recent Labs  Lab 08/18/21 1639  INR 1.3*    Cardiac Enzymes: No results for input(s): CKTOTAL, CKMB, CKMBINDEX, TROPONINI in the last 168 hours. BNP (last 3 results) No results for input(s): PROBNP in the last 8760 hours. HbA1C: No results for input(s): HGBA1C in the last 72 hours. CBG: No results for input(s): GLUCAP in the last 168 hours. Lipid Profile: No results for input(s): CHOL, HDL, LDLCALC, TRIG, CHOLHDL, LDLDIRECT in the last 72 hours.  Thyroid Function Tests: No results for input(s): TSH, T4TOTAL, FREET4, T3FREE, THYROIDAB in the last 72 hours. Anemia Panel: No results for input(s): VITAMINB12, FOLATE, FERRITIN, TIBC, IRON, RETICCTPCT in the last 72 hours. Sepsis Labs: No results for input(s): PROCALCITON, LATICACIDVEN in the last 168 hours.  Recent Results (from the past 240 hour(s))  Resp Panel by RT-PCR (Flu A&B, Covid) Nasopharyngeal Swab     Status: None   Collection Time: 08/14/21  4:26 PM   Specimen: Nasopharyngeal Swab; Nasopharyngeal(NP) swabs in vial transport medium  Result Value Ref Range Status   SARS Coronavirus 2  by RT PCR NEGATIVE NEGATIVE Final    Comment: (NOTE) SARS-CoV-2 target nucleic acids are NOT DETECTED.  The SARS-CoV-2 RNA is generally detectable in upper respiratory specimens during the acute phase of infection. The lowest concentration of SARS-CoV-2 viral copies this assay can detect is 138 copies/mL. A negative result does not preclude SARS-Cov-2 infection and should not be used as the sole basis for treatment or other patient management decisions. A negative result may occur with  improper specimen collection/handling, submission of specimen other than nasopharyngeal swab, presence of viral mutation(s) within the areas targeted by this assay, and inadequate number of viral copies(<138 copies/mL). A negative result must be combined with clinical observations, patient history, and epidemiological information. The expected result is Negative.  Fact Sheet for Patients:  EntrepreneurPulse.com.au  Fact Sheet for Healthcare Providers:  IncredibleEmployment.be  This test is no t yet approved or cleared by the [place] FDA and  has been authorized for detection and/or diagnosis of SARS-CoV-2 by FDA under an Emergency Use Authorization (EUA). This EUA will remain  in effect (meaning this test can be used) for the duration of the COVID-19 declaration under Section 564(b)(1) of the Act, 21 U.S.C.section 360bbb-3(b)(1), unless the authorization is terminated  or revoked sooner.       Influenza A by PCR NEGATIVE NEGATIVE Final   Influenza B by PCR NEGATIVE NEGATIVE Final    Comment: (NOTE) The Xpert Xpress SARS-CoV-2/FLU/RSV plus assay is intended as an aid in the diagnosis of influenza from Nasopharyngeal swab specimens and should not be used as a sole basis for treatment. Nasal washings and aspirates are unacceptable for Xpert Xpress SARS-CoV-2/FLU/RSV testing.  Fact Sheet for Patients: EntrepreneurPulse.com.au  Fact  Sheet for Healthcare Providers: IncredibleEmployment.be  This test is not yet approved or  cleared by the [place] and has been authorized for detection and/or diagnosis of SARS-CoV-2 by FDA under an Emergency Use Authorization (EUA). This EUA will remain in effect (meaning this test can be used) for the duration of the COVID-19 declaration under Section 564(b)(1) of the Act, 21 U.S.C. section 360bbb-3(b)(1), unless the authorization is terminated or revoked.  Performed at [facility], [place] [address], [place], [place] [address]   Culture, blood (routine x 2)     Status: None   Collection Time: 08/14/21  9:32 PM   Specimen: BLOOD RIGHT HAND  Result Value Ref Range Status   Specimen Description BLOOD RIGHT HAND  Final   Special Requests   Final    BOTTLES DRAWN AEROBIC AND ANAEROBIC Blood Culture results may not be optimal due to an inadequate volume of blood received in culture bottles   Culture   Final    NO GROWTH 5 DAYS Performed at [facility], [place] [address], [place], [place] [address]    Report Status 08/19/2021 FINAL  Final  Culture, blood (routine x 2)     Status: None   Collection Time: 08/14/21  9:33 PM   Specimen: BLOOD  Result Value Ref Range Status   Specimen Description BLOOD LEFT ANTECUBITAL  Final   Special Requests   Final    BOTTLES DRAWN AEROBIC AND ANAEROBIC Blood Culture adequate volume   Culture   Final    NO GROWTH 5 DAYS Performed at [facility], [place] [address] [place], [place] [address]    Report Status 08/19/2021 FINAL  Final  Urine Culture     Status: Abnormal   Collection Time: 08/16/21  9:38 AM   Specimen: Urine, Clean Catch  Result Value Ref Range Status   Specimen Description URINE, CLEAN CATCH  Final   Special Requests NONE  Final   Culture (A)  Final    <10,000 COLONIES/mL INSIGNIFICANT GROWTH Performed at [facility], [place] [address], [place], [place] [address]    Report Status  08/17/2021 FINAL  Final  Culture, blood (routine x 2)     Status: None (Preliminary result)   Collection Time: 08/18/21  9:13 AM   Specimen: BLOOD  Result Value Ref Range Status   Specimen Description BLOOD LEFT ANTECUBITAL  Final   Special Requests   Final    BOTTLES DRAWN AEROBIC AND ANAEROBIC Blood Culture adequate volume   Culture   Final    NO GROWTH 2 DAYS Performed at [facility], [place] [address], [place], [place] [address]    Report Status PENDING  Incomplete  Culture, blood (routine x 2)     Status: None (Preliminary result)   Collection Time: 08/18/21  9:25 AM   Specimen: BLOOD LEFT HAND  Result Value Ref Range Status   Specimen Description BLOOD LEFT HAND  Final   Special Requests   Final    BOTTLES DRAWN AEROBIC AND ANAEROBIC Blood Culture adequate volume   Culture   Final    NO GROWTH 2 DAYS Performed at [facility], [place] [address], [place], [place] [address]    Report Status PENDING  Incomplete  Aerobic/Anaerobic Culture w Gram Stain (surgical/deep wound)     Status: None (Preliminary result)   Collection Time: 08/18/21  2:57 PM   Specimen: Pleural Fluid  Result Value Ref Range Status   Specimen Description PLEURAL FLUID  Final   Special Requests RIGHT CHEST  Final  Gram Stain   Final    ABUNDANT WBC PRESENT,BOTH PMN AND MONONUCLEAR NO ORGANISMS SEEN Performed at [facility], [place] [address] [place], [place] [address]    Culture   Final    RARE STREPTOCOCCUS PNEUMONIAE SUSCEPTIBILITIES PERFORMED ON PREVIOUS CULTURE WITHIN THE LAST 5 DAYS. NO ANAEROBES ISOLATED; CULTURE IN PROGRESS FOR 5 DAYS    Report Status PENDING  Incomplete  Aerobic/Anaerobic Culture w Gram Stain (surgical/deep wound)     Status: None (Preliminary result)   Collection Time: 08/18/21  2:58 PM   Specimen: Pleural Fluid  Result Value Ref Range Status   Specimen Description PLEURAL FLUID  Final   Special Requests LEFT CHEST  Final   Gram Stain   Final    ABUNDANT WBC  PRESENT,BOTH PMN AND MONONUCLEAR NO ORGANISMS SEEN Performed at [facility], [address], [place], [place] [address]    Culture   Final    FEW STREPTOCOCCUS PNEUMONIAE CRITICAL RESULT CALLED TO, READ BACK BY AND VERIFIED WITH: RN K.SICKLES ON [id number] AT 1120 BY E.PARRISH NO ANAEROBES ISOLATED; CULTURE IN PROGRESS FOR 5 DAYS    Report Status PENDING  Incomplete   Organism ID, Bacteria STREPTOCOCCUS PNEUMONIAE  Final      Susceptibility   Streptococcus pneumoniae - MIC*    ERYTHROMYCIN >=8 RESISTANT Resistant     LEVOFLOXACIN 0.5 SENSITIVE Sensitive     VANCOMYCIN <=0.12 SENSITIVE Sensitive     PENICILLIN (meningitis) 0.25 RESISTANT Resistant     PENO - penicillin 0.25      PENICILLIN (non-meningitis) 0.25 SENSITIVE Sensitive     PENICILLIN (oral) 0.25 INTERMEDIATE Intermediate     CEFTRIAXONE (non-meningitis) <=0.12 SENSITIVE Sensitive     CEFTRIAXONE (meningitis) <=0.12 SENSITIVE Sensitive     * FEW STREPTOCOCCUS PNEUMONIAE  Surgical pcr screen     Status: None   Collection Time: 08/20/21  7:56 AM   Specimen: Nasal Mucosa; Nasal Swab  Result Value Ref Range Status   MRSA, PCR NEGATIVE NEGATIVE Final   Staphylococcus aureus NEGATIVE NEGATIVE Final    Comment: (NOTE) The Xpert SA Assay (FDA approved for NASAL specimens in patients 73 years of age and older), is one component of a comprehensive surveillance program. It is not intended to diagnose infection nor to guide or monitor treatment. Performed at [facility], [place] [address], [place], [place] [address]   Body fluid culture w Gram Stain     Status: None (Preliminary result)   Collection Time: 08/20/21 10:58 AM   Specimen: PATH Other; Body Fluid  Result Value Ref Range Status   Specimen Description PLEURAL FLUID  Final   Special Requests NONE  Final   Gram Stain   Final    ABUNDANT WBC PRESENT, PREDOMINANTLY PMN NO ORGANISMS SEEN Performed at [facility], [address] [place], [place]  [address]    Culture PENDING  Incomplete   Report Status PENDING  Incomplete  Aerobic/Anaerobic Culture w Gram Stain (surgical/deep wound)     Status: None (Preliminary result)   Collection Time: 08/20/21 11:02 AM   Specimen: PATH Other; Body Fluid  Result Value Ref Range Status   Specimen Description PLEURAL  Final   Special Requests NONE  Final   Gram Stain   Final    ABUNDANT WBC PRESENT, PREDOMINANTLY PMN NO ORGANISMS SEEN Performed at [facility], [address], [place], [place] [address]    Culture PENDING  Incomplete   Report Status PENDING  Incomplete          Radiology Studies: CT chest w/o contrast  Result Date: 08/20/2021 CLINICAL DATA:  Follow-up left empyema, treated with lytic therapy. EXAM: CT CHEST WITHOUT CONTRAST TECHNIQUE: Multidetector CT imaging of the chest was performed following the standard protocol without IV contrast. RADIATION DOSE REDUCTION: This exam was performed according to the departmental dose-optimization program which includes automated exposure control, adjustment of the mA and/or kV according to patient size and/or use of iterative reconstruction technique. COMPARISON:  08/18/2021 FINDINGS: Cardiovascular: No significant vascular findings. Normal heart size. No pericardial effusion. Mediastinum/Nodes: No enlarged mediastinal or axillary lymph nodes. Thyroid gland, trachea, and esophagus demonstrate no significant findings. Lungs/Pleura: Interval bilateral pleural pigtail catheters. Moderate-sized left pleural effusion with an approximately 50% decrease in size. Interval small amount of interspersed air or gas in the pleural fluid on the left. Small right pleural effusion with an approximately 90% decrease in size. Bilateral lower lobe atelectasis without significant change. Upper Abdomen: Unremarkable. Musculoskeletal: Normal appearing bones. IMPRESSION: 1. Interval bilateral pleural catheters. 2. Moderate left pleural effusion with an  approximately 50% decrease in size. 3. Small right pleural effusion with an approximately 90% decrease in size. 4. Interval small amount of air or gas interspersed in the left pleural fluid, most likely introduced by the left pleural catheter. The fact that this is suspended within the fluid indicates a thicker fluid. 5. No significant change in bilateral lower lobe atelectasis Electronically Signed   By: Claudie Revering M.D.   On: 08/20/2021 08:19   DG Chest Port 1 View  Result Date: 08/21/2021 CLINICAL DATA:  Pneumothorax EXAM: PORTABLE CHEST 1 VIEW COMPARISON:  Previous studies including the examination of 08/20/2021 FINDINGS: Transverse diameter of heart is increased. Central pulmonary vessels are prominent. There is a pigtail right chest tube with its tip in the medial right lower lung fields. There is a larger caliber left chest tube with its tip in the medial left mid lung fields. There is small loculated pneumothorax near the left lateral costophrenic angle. There is no demonstrable apical pneumothorax. Subcutaneous emphysema is seen in the left chest wall. There are patchy infiltrates in both lower lung fields, more so on the left side. IMPRESSION: Small loculated left pneumothorax is seen in the lateral aspect of left lower lung fields. Small left pleural effusion. There are patchy infiltrates in both lower lung fields suggesting atelectasis/pneumonia. Electronically Signed   By: Elmer Picker M.D.   On: 08/21/2021 08:02   DG Chest Port 1 View  Result Date: 08/20/2021 CLINICAL DATA:  Pleural effusions EXAM: PORTABLE CHEST 1 VIEW COMPARISON:  Previous studies including the examination done earlier today FINDINGS: Transverse diameter of heart is increased. No significant changes seen in the right pleural pigtail catheter. There is interval improvement in aeration of right parahilar region suggesting decrease in effusion in the interlobar fissure. There is interval replacement of pigtail left chest  tube with a larger caliber chest tube. There is improvement in aeration of left lower lung fields suggesting significant decrease in left pleural effusion. There is small loculated pneumothorax in the lateral aspect of left lower lung fields. There is no definite left apical pneumothorax. Subcutaneous emphysema is seen in the left chest wall. IMPRESSION: There is significant interval decrease in the left pleural effusion after placement of larger caliber left chest tube. There is a small residual left pleural effusion and small loculated pneumothorax in the lateral aspect of left lower lung fields. Electronically Signed   By: Royston Cowper  Rathinasamy M.D.   On: 08/20/2021 13:40   DG Chest Port 1 View  Result Date: 08/20/2021 CLINICAL DATA:  Chest tubes. EXAM: PORTABLE CHEST 1 VIEW COMPARISON:  Chest XR, most recently 08/19/2021. CT chest, 08/18/2021. IR CT, 08/18/2018. FINDINGS: Support lines: Stable positioning of bilateral basilar-directed thoracostomy tubes. Obscured cardiac apex. Hypoinflated lungs. Similar appearance with LEFT basilar consolidation and moderate pleural effusion. Trace RIGHT pleural effusion. No pneumothorax. No interval osseous abnormality. IMPRESSION: 1. Stable positioning of bilateral pigtail thoracostomy tubes. No pneumothorax. 2. Similar LEFT basilar consolidation and moderate pleural effusion. 3. Trace RIGHT pleural effusion Electronically Signed   By: Michaelle Birks M.D.   On: 08/20/2021 07:17        Scheduled Meds:  acetaminophen  1,000 mg Oral Q6H   Or   acetaminophen (TYLENOL) oral liquid 160 mg/5 mL  1,000 mg Oral Q6H   atorvastatin  80 mg Oral Daily   bisacodyl  10 mg Oral Daily   Chlorhexidine Gluconate Cloth  6 each Topical Q0600   darbepoetin (ARANESP) injection - NON-DIALYSIS  100 mcg Subcutaneous Q Thu-1800   ezetimibe  10 mg Oral Daily   furosemide  80 mg Intravenous Q6H   pantoprazole  40 mg Oral Daily   senna-docusate  1 tablet Oral QHS   Continuous  Infusions:  albumin human 60 mL/hr at 08/21/21 0800   bupivacaine 0.5 % ON-Q pump SINGLE CATH 400 mL     piperacillin-tazobactam (ZOSYN)  IV Stopped (08/21/21 0552)     LOS: 7 days    Time spent: 35 minutes.     Elmarie Shiley, MD [facility]   If 7PM-7AM, please contact night-coverage [email or web address]  08/21/2021, 8:34 AM

## 2021-08-21 NOTE — Progress Notes (Signed)
47ml of fentanyl PCA wasted into stericycle in the presence of RN Shelby Dubin.

## 2021-08-21 NOTE — Progress Notes (Signed)
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  He reports a cough minimally productive of clear sputum and bilateral chest pain when he would cough or take deep breaths.  Reports preceding URI symptoms and tested negative for COVID and flu x2, no sick contacts.  He tried marijuana to increase his appetite.  Initial labs showed BUN/creatinine of 76/6.4, baseline noted to be 2.2 about 3 weeks ago.  WBC count was 20 5K with hemoglobin of 8.7 Chest x-ray showed bibasal effusions  He was initially given IV fluids then developed fever, urine culture showed less than 10K organisms, started ceftriaxone and azithromycin to cover UTI and pneumonia.  HIV negative.  CT chest without contrast 1/28 showed large left and moderate right, partially loculated pleural effusions.  Mass effect on underlying lung with passive atelectasis Hence PCCM consulted  He states that he has not seen a nephrologist over the past year since he was "bouncing around"  Significant tests/ events reviewed 1/28 CT-guided bilateral chest tubes   SUBJECTIVE: Underwent left-sided VATS yesterday. R sided chest tube remains with purulent output, improved on CT scan   VITAL SIGNS: Temp:  [97.6 F (36.4 C)-98.3 F (36.8 C)] 97.6 F (36.4 C) (01/31 0400) Pulse Rate:  [70-103] 77 (01/31 0814) Resp:  [12-34] 20 (01/31 0814) BP: (134-151)/(99-120) 134/104 (01/31 0814) SpO2:  [94 %-100 %] 97 % (01/31 0814) Arterial Line BP: (123-169)/(80-127) 134/106 (01/31 0553) FiO2 (%):  [28 %] 28 % (01/30 1335) Weight:  [75.1 kg] 75.1 kg (01/31 0652)  PHYSICAL  EXAMINATION: Gen. acutely ill appearing young male, sitting on the chair HENT - [place]/AT, moist mucous membranes Lungs: shallow breathing, purulent bloody drainage from R chest tube, serosanguinous output from left chest tube. Tidaling but no air leak. Cardiovascular: S1S2, RRR, no murmur appreciated Abdomen: soft, TTP Musculoskeletal: no c/c/e Neuro: Awake, alert, following commands, moving all 4 extremities  Skin:  warm, no rashes.  Repeat chest x-ray showed small residual left-sided pneumothorax, right-sided chest tube in place with bilateral infiltrates   Bilateral pleural fluid> strep pneumoniae Blood cx NGTD   Recent Labs  Lab 08/19/21 0121 08/20/21 0051 08/21/21 0327 08/21/21 0328  HGB 9.1* 8.3* 8.8* 9.2*  HCT 26.9* 25.8* 26.0* 27.7*  WBC 18.1* 13.3*  --  20.2*  PLT 871* 860*  --  826*    ASSESSMENT / PLAN: Acute respiratory failure with hypoxia Acute bilateral lower lobe pneumonia Bilateral empyema-- pneumococcus. Continue supplemental oxygen, titrate with O2 sat goal 92% Appreciate TCTS input Patient underwent left-sided VATS Right-sided chest tube in place Continue IV antibiotics Pneumococcal culture is sensitive to ceftriaxone, would recommend switching back to ceftriaxone from Zosyn Patient is having still good chest tube output, closely monitor Daily x-ray chest Continue pain control per primary team Con't chest tube to suction   AKI on CKD/FSGS Management per nephrology  Anemia of chronic disease Monitor H&H and transfuse for Hb<7 or hemodynamically significant bleeding  PCCM will continue to follow for right-sided chest tube management    Jacky Kindle MD [place] for pager If no response to pager, please call [phone] until 7pm After 7pm, Please call  E-link [phone]

## 2021-08-22 ENCOUNTER — Inpatient Hospital Stay (HOSPITAL_COMMUNITY): Payer: Medicaid Other

## 2021-08-22 DIAGNOSIS — J154 Pneumonia due to other streptococci: Secondary | ICD-10-CM

## 2021-08-22 DIAGNOSIS — N189 Chronic kidney disease, unspecified: Secondary | ICD-10-CM

## 2021-08-22 DIAGNOSIS — Z4682 Encounter for fitting and adjustment of non-vascular catheter: Secondary | ICD-10-CM

## 2021-08-22 DIAGNOSIS — J869 Pyothorax without fistula: Secondary | ICD-10-CM | POA: Diagnosis not present

## 2021-08-22 LAB — PATHOLOGIST SMEAR REVIEW

## 2021-08-22 LAB — COMPREHENSIVE METABOLIC PANEL
ALT: 8 U/L (ref 0–44)
AST: 17 U/L (ref 15–41)
Albumin: 1.6 g/dL — ABNORMAL LOW (ref 3.5–5.0)
Alkaline Phosphatase: 49 U/L (ref 38–126)
Anion gap: 11 (ref 5–15)
BUN: 57 mg/dL — ABNORMAL HIGH (ref 6–20)
CO2: 22 mmol/L (ref 22–32)
Calcium: 7.4 mg/dL — ABNORMAL LOW (ref 8.9–10.3)
Chloride: 107 mmol/L (ref 98–111)
Creatinine, Ser: 4.79 mg/dL — ABNORMAL HIGH (ref 0.61–1.24)
GFR, Estimated: 16 mL/min — ABNORMAL LOW (ref >60)
Glucose, Bld: 103 mg/dL — ABNORMAL HIGH (ref 70–99)
Potassium: 3.9 mmol/L (ref 3.5–5.1)
Sodium: 140 mmol/L (ref 135–145)
Total Bilirubin: <0.1 mg/dL — ABNORMAL LOW (ref 0.3–1.2)
Total Protein: 5.9 g/dL — ABNORMAL LOW (ref 6.5–8.1)

## 2021-08-22 LAB — CBC
HCT: 25.2 % — ABNORMAL LOW (ref 39.0–52.0)
Hemoglobin: 8.1 g/dL — ABNORMAL LOW (ref 13.0–17.0)
MCH: 28.3 pg (ref 26.0–34.0)
MCHC: 32.1 g/dL (ref 30.0–36.0)
MCV: 88.1 fL (ref 80.0–100.0)
Platelets: 746 10*3/uL — ABNORMAL HIGH (ref 150–400)
RBC: 2.86 MIL/uL — ABNORMAL LOW (ref 4.22–5.81)
RDW: 12.5 % (ref 11.5–15.5)
WBC: 16.3 10*3/uL — ABNORMAL HIGH (ref 4.0–10.5)
nRBC: 0 % (ref 0.0–0.2)

## 2021-08-22 LAB — PROTEIN / CREATININE RATIO, URINE
Creatinine, Urine: 315.63 mg/dL
Protein Creatinine Ratio: 4.19 mg/mg{Cre} — ABNORMAL HIGH (ref 0.00–0.15)
Total Protein, Urine: 1321 mg/dL

## 2021-08-22 MED ORDER — ALBUMIN HUMAN 25 % IV SOLN
25.0000 g | Freq: Four times a day (QID) | INTRAVENOUS | Status: AC
  Administered 2021-08-22 (×2): 25 g via INTRAVENOUS
  Filled 2021-08-22 (×2): qty 100

## 2021-08-22 MED ORDER — CHLORHEXIDINE GLUCONATE CLOTH 2 % EX PADS: 6.0000 | MEDICATED_PAD | Freq: Every day | CUTANEOUS | Status: DC

## 2021-08-22 NOTE — Progress Notes (Addendum)
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  He reports a cough minimally productive of clear sputum and bilateral chest pain when he would cough or take deep breaths.  Reports preceding URI symptoms and tested negative for COVID and flu x2, no sick contacts.  He tried marijuana to increase his appetite.  Initial labs showed BUN/creatinine of 76/6.4, baseline noted to be 2.2 about 3 weeks ago.  WBC count was 20 5K with hemoglobin of 8.7 Chest x-ray showed bibasal effusions  He was initially given IV fluids then developed fever, urine culture showed less than 10K organisms, started ceftriaxone and azithromycin to cover UTI and pneumonia.  HIV negative.  CT chest without contrast 1/28 showed large left and moderate right, partially loculated pleural effusions.  Mass effect on underlying lung with passive atelectasis Hence PCCM consulted  He states that he has not seen a nephrologist over the past year since he was "bouncing around"  Significant tests/ events reviewed 1/28 CT-guided bilateral chest tubes 1/30 left VATs  SUBJECTIVE:  Still having pain.    VITAL SIGNS: Temp:  [97.8 F (36.6 C)-98.6 F (37 C)] 97.8 F (36.6 C) (02/01 0353) Pulse Rate:  [73-90] 87 (02/01 0736) Resp:  [13-22] 13 (02/01 0736) BP: (127-143)/(97-110) 138/105 (02/01 0736) SpO2:  [96 %-100 %] 97 % (02/01 0736) Room Air  PHYSICAL EXAMINATION:  General this is a 26 yom currently resting in bed. He is in no distress HENT NCAT. MMM Pulm decreased both bases. Right chest tube w/ about 30 ml last 24 hrs. Left chest tube ~ 50 CXR: improved  bilateral aeration. Still has some left basilar volume loss.  Card rrr Abd soft  Ext warm  trace dependent edema  Neuro intact   Resolved problem list   Acute respiratory failure with hypoxia  ASSESSMENT / PLAN:  Acute bilateral lower lobe pneumonia Bilateral empyema-- pneumococcus. 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    Pulm problem list  Bilateral Pneumococcal PNA w/ bilateral Empyema. S/p left VAT 1/30 and right chest tube placement w/ 1 round of Lytics on right.   -CXR improving chest tube output minimal -pneumococcal (sensitive to CTX) -chest tube output minimal  Plan/rec Cont post-op care per CVTS (deferring larger bore left tube to them) Post-op pulm hygiene and pain management  Will keep right CT to sxn today. The output has been minimal. If less than 50 ml output over next 24 hrs can prob dc the right tube Will need extensive abx course now day 8. Should plan on at least 4 weeks total. With plan to f/u out pt imaging prior to stopping   Erick Colace ACNP-BC [place] Pager # [phone] OR # [phone] if no answer    Attending attestation: Mr. Klingensmith has ongoing pain from his chest tubes but otherwise feels fair.  BP (!) 138/105 (BP Location: Right Arm)    Pulse 87    Temp 97.8 F (36.6 C) (Oral)    Resp 13    Ht 5\' 10"  (1.778 m)    Wt 75.1 kg  SpO2 97%    BMI 23.76 kg/m  Young man sitting up in bed watching TV, drinking juice [place]/AT, eyes anicteric Breathing comfortably on RA. Reduced R breath sounds in the base, CTA in left base. Left chest tube draining serous fluid, minimally bloody. R chest tube slightly more bloody, but thinner fluid than 2 days ago. 140cc in atrium. No air leak in either tube. S1S2, tachycardic, reg rhythm Abd soft, NT Skin warm, dry Awake, alert, answering questions  CXR personally reviewed> not  reaccumulating fluid on R, chest tube in lower hemithorax. L chest tube remains, small lateral lower pneumothorax.   WBC 16.3 H/H 8.1/25.2 BUN 57 Cr 4.79  Assessment & plan:  Bilateral pneumococcal pneumonia and empyemas s/p VATS on the left after failed chest tube and lytics. Chest tube and lytics resolved effusion on the R.  -Con't antibiotics; will need prolonged course of antibiotics for the loculated portion of empyema on the R that did not resolved with tube. Anticipate since he is immunocompromised that he should be on 6 weeks of antibiotics total to sterilize this space. -Hopefully can remove chest tube on the R tomorrow- 140cc in the atrium today.  -Chest tube management on the left per Dr. Lucianne Lei Tright -AM CXR to ensure he is not reaccumulating fluid despite tube being in place.  FSGS, AKI on CKD -nephrology planning on chronic immunosuppression to control disease  Julian Hy, DO 08/22/21 3:27 PM [facility]

## 2021-08-22 NOTE — Progress Notes (Signed)
Subjective:   Patient feels about the same today.  Creatinine does continue to improve.  Good urine output.  He is unsure if his edema has significantly improved.  Long discussion with the patient this morning about some of his difficulties outpatient.  He has a 25-year-old daughter and has been dealing with homelessness intermittently.  We started some discussions on possible options.  Likely would qualify for disability based on his current bout of illnesses.  Discussed establishing care with a primary care physician  Objective Vital signs in last 24 hours: Vitals:   08/21/21 1955 08/21/21 2349 08/22/21 0353 08/22/21 0736  BP: (!) 134/100 (!) 143/107 (!) 138/101 (!) 138/105  Pulse: 85 87 85 87  Resp: 17 18 18 13   Temp: 98.1 F (36.7 C) 98.6 F (37 C) 97.8 F (36.6 C)   TempSrc: Oral Oral Oral   SpO2: 98% 98% 97% 97%  Weight:      Height:       Weight change:   Intake/Output Summary (Last 24 hours) at 08/22/2021 0946 Last data filed at 08/22/2021 1607 Gross per 24 hour  Intake 1037.55 ml  Output 3050 ml  Net -2012.45 ml     Assessment/Plan:25 ear old BM with history of FSG-  course complicated by frequent flaring and medication non compliance-  now with A on CRF and likely disease flaring  1.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  Creatinine was normal and in November 2022.  15 days prior to admission creatinine was around 2.2.  Felt that his acute kidney injury here with creatinine up to 6 was more likely ATN on top of his FSGS given the background of infection.  He has been treated with steroids in the past.    It is likely patient is going to need alternative immunotherapy after everything is said and done.  Not excited about a CNI right now given his active AKI.  Rituximab could be considered but would want to adequately treat his significant infection first.  Trying to avoid excessive steroids. -Continue to  monitor creatinine daily -Albumin has improved to 1.6 today, redose with total of 50 g today ideally to achieve an albumin greater than 2 -Continue treatment of infection -Consider further immunosuppression based on response   2. Hypertension/volume  -significant edema but overall improved.  IV albumin.  Continue IV Lasix 3.  Appetite sxms-has improved with treatment of infection.  Unlikely uremia 4. Anemia  - indicates long term CKD vs GIB possibly due to long term steroids -  have put him on protonix -  and give dose of ESA - hgb around 8-9 5.  Elevated WBC/bilateral empyema-  fever. Leukopenia and pyuria-initially treated for UTI now with concern of bilateral empyema.  Appreciate primary and consultant management.  Status post thoracotomy and bilateral chest tubes.  Continue antibiotics per primary team     Reesa Chew    Labs: Basic Metabolic Panel: Recent Labs  Lab 08/19/21 0121 08/20/21 0051 08/21/21 0327 08/21/21 0328 08/22/21 0106  NA 134* 137 139 137 140  K 4.8 4.6 5.0 4.7 3.9  CL 104 107  --  106 107  CO2 20* 22  --  21* 22  GLUCOSE 114* 110*  --  127* 103*  BUN 70* 62*  --  58* 57*  CREATININE 5.79* 5.37*  --  5.09* 4.79*  CALCIUM 7.0* 7.4*  --  7.4* 7.4*  PHOS 9.0* 9.4*  --  9.4*  --  Liver Function Tests: Recent Labs  Lab 08/20/21 0051 08/21/21 0328 08/22/21 0106  AST  --   --  17  ALT  --   --  8  ALKPHOS  --   --  49  BILITOT  --   --  <0.1*  PROT  --   --  5.9*  ALBUMIN <1.5* <1.5* 1.6*   No results for input(s): LIPASE, AMYLASE in the last 168 hours.  No results for input(s): AMMONIA in the last 168 hours. CBC: Recent Labs  Lab 08/16/21 0256 08/17/21 0140 08/18/21 0157 08/19/21 0121 08/20/21 0051 08/21/21 0327 08/21/21 0328 08/22/21 0106  WBC 24.8*   < > 24.4* 18.1* 13.3*  --  20.2* 16.3*  NEUTROABS 21.0*  --   --   --   --   --   --   --   HGB 8.7*   < > 8.2* 9.1* 8.3* 8.8* 9.2* 8.1*  HCT 26.4*   < > 24.3* 26.9* 25.8* 26.0*  27.7* 25.2*  MCV 87.4   < > 87.1 86.8 87.8  --  87.7 88.1  PLT 908*   < > 788* 871* 860*  --  826* 746*   < > = values in this interval not displayed.   Cardiac Enzymes: No results for input(s): CKTOTAL, CKMB, CKMBINDEX, TROPONINI in the last 168 hours. CBG: No results for input(s): GLUCAP in the last 168 hours.  Iron Studies: No results for input(s): IRON, TIBC, TRANSFERRIN, FERRITIN in the last 72 hours. Studies/Results: DG Chest Port 1 View  Result Date: 08/21/2021 CLINICAL DATA:  Pneumothorax EXAM: PORTABLE CHEST 1 VIEW COMPARISON:  Previous studies including the examination of 08/20/2021 FINDINGS: Transverse diameter of heart is increased. Central pulmonary vessels are prominent. There is a pigtail right chest tube with its tip in the medial right lower lung fields. There is a larger caliber left chest tube with its tip in the medial left mid lung fields. There is small loculated pneumothorax near the left lateral costophrenic angle. There is no demonstrable apical pneumothorax. Subcutaneous emphysema is seen in the left chest wall. There are patchy infiltrates in both lower lung fields, more so on the left side. IMPRESSION: Small loculated left pneumothorax is seen in the lateral aspect of left lower lung fields. Small left pleural effusion. There are patchy infiltrates in both lower lung fields suggesting atelectasis/pneumonia. Electronically Signed   By: Elmer Picker M.D.   On: 08/21/2021 08:02   DG Chest Port 1 View  Result Date: 08/20/2021 CLINICAL DATA:  Pleural effusions EXAM: PORTABLE CHEST 1 VIEW COMPARISON:  Previous studies including the examination done earlier today FINDINGS: Transverse diameter of heart is increased. No significant changes seen in the right pleural pigtail catheter. There is interval improvement in aeration of right parahilar region suggesting decrease in effusion in the interlobar fissure. There is interval replacement of pigtail left chest tube with a  larger caliber chest tube. There is improvement in aeration of left lower lung fields suggesting significant decrease in left pleural effusion. There is small loculated pneumothorax in the lateral aspect of left lower lung fields. There is no definite left apical pneumothorax. Subcutaneous emphysema is seen in the left chest wall. IMPRESSION: There is significant interval decrease in the left pleural effusion after placement of larger caliber left chest tube. There is a small residual left pleural effusion and small loculated pneumothorax in the lateral aspect of left lower lung fields. Electronically Signed   By: Prudy Feeler.D.  On: 08/20/2021 13:40   Medications: Infusions:  albumin human     cefTRIAXone (ROCEPHIN)  IV Stopped (08/21/21 2241)   metronidazole 500 mg (08/22/21 0929)     Scheduled Medications:  acetaminophen  1,000 mg Oral Q6H   Or   acetaminophen (TYLENOL) oral liquid 160 mg/5 mL  1,000 mg Oral Q6H   atorvastatin  80 mg Oral Daily   darbepoetin (ARANESP) injection - NON-DIALYSIS  100 mcg Subcutaneous Q Thu-1800   ezetimibe  10 mg Oral Daily   furosemide  80 mg Intravenous Q6H   pantoprazole  40 mg Oral Daily   senna-docusate  1 tablet Oral QHS    have reviewed scheduled and prn medications.  Physical Exam: General: Sitting in chair, no distress Heart: Tachycardia, regular rhythm Lungs: Bilateral chest rise, short quick breathing Abdomen: soft, non tender Extremities: 1+ pitting edema in the bilateral lower extremities, warm and well perfused    08/22/2021,9:46 AM  LOS: 8 days

## 2021-08-22 NOTE — Progress Notes (Signed)
Initial Nutrition Assessment  DOCUMENTATION CODES:   Non-severe (moderate) malnutrition in context of acute illness/injury  INTERVENTION:   - Boost Breeze po TID, each supplement provides 250 kcal and 9 grams of protein  - Liberalize diet to Regular, verbal with readback order placed per Nephrology MD  - Encourage PO intake  NUTRITION DIAGNOSIS:   Moderate Malnutrition related to acute illness (empyema, pneumonia) as evidenced by mild fat depletion, moderate muscle depletion.  GOAL:   Patient will meet greater than or equal to 90% of their needs  MONITOR:   PO intake, Supplement acceptance, Labs, Weight trends, I & O's  REASON FOR ASSESSMENT:   Malnutrition Screening Tool    ASSESSMENT:   26 year old male who presented to the ED on 1/24 with chest pain, loss of taste, anorexia. PMH of focal segmental glomerulosclerosis noncompliant with outpatient Nephrology care/medications, HTN, HLD. Pt admitted with AKI in setting of FSGS, pleural effusion.  01/28 - s/p bilateral pigtail thoracostomy drainage catheter placement 01/30 - s/p left VATS for drainage of left empyema  Pt found to have bilateral pneumococcal PNA with bilateral empyema and is s/p L VATS on 1/30.  Pt currently on a renal diet with no fluid restriction.  Spoke with pt at bedside. Pt with poor PO intake consisting mostly of fruit, juice, and cold foods like [place] ice which pt was eating at time of RD visit. Pt states that he has had a decreased appetite for about 1 month (decreased appetite began when he started feeling sick) and that he also feels nauseous when he tries to eat more solid foods. Pt is aware of the need to improve PO intake to promote healing and states that he will try. Pt states that he normally eats very well and could eat someone "out of house and home." Pt does not like Ensure supplements but willing to try Boost Breeze.  Discussed diet liberalization with pt who was willing to try this. He  thinks that if he has more options, he would be able to eat more (example: fries). Discussed with Nephrology MD who was in agreement with liberalizing diet to Regular. Orders placed.  Pt is unsure whether he has lost weight but suspects that he has secondary to diuresis. Pt reports that his UBW is 140-150 lbs. Pt states that he weighed ~170 lbs when he was admitted but suspected that this was due to volume overload as his legs were very swollen. Most recent weight from 1/31 documented as 165.6 lbs (75.1 kg).  Reviewed available weight history in chart. Pt with a 6.8 kg weight loss since 10/01/20. This is an 8.3% weight loss in 11 months which is not significant for timeframe. Unsure how much of weight loss can be attributed to fluid shifts vs true dry weight loss.  Discussed with pt the importance of increasing PO intake to prevent further dry weight loss and to maintain lean muscle mass. Pt expresses understanding.  Discussed pt with CCM NP. Per notes, pt was supposed to have pleural fluid checked for triglycerides to evaluate for chylothorax. However, RD cannot locate these results. CCM aware and will look into this further. Drainage from right chest tube is milky and purulent in appearance.  Admit weight: 78.8 kg Current weight: 75.1 kg  Pt with non-pitting edema to BLE.  Meal Completion: 0-100% x last 8 documented meals (multiple meal completions missing)  Medications reviewed and include: aranesp weekly, IV lasix, protonix, senna, IV albumin, IV abx  Labs reviewed: potassium 3.4, BUN 42 (  trending down), creatinine 3.49 (trending down), ionized calcium 1.01 on 1/31, phosphorus 5.7 (trending down), hemoglobin 8.4, WBC 15.2  UOP: 3725 ml x 24 hours CT 1: 80 ml x 24 hours CT 2: 108 ml x 24 hours I/O's: -6.0 L since admit  NUTRITION - FOCUSED PHYSICAL EXAM:  Flowsheet Row Most Recent Value  Orbital Region Mild depletion  Upper Arm Region Moderate depletion  Thoracic and Lumbar Region  Mild depletion  Buccal Region Mild depletion  Temple Region Mild depletion  Clavicle Bone Region Moderate depletion  Clavicle and Acromion Bone Region Moderate depletion  Scapular Bone Region Mild depletion  Dorsal Hand Mild depletion  Patellar Region Mild depletion  Anterior Thigh Region Mild depletion  Posterior Calf Region No depletion  Edema (RD Assessment) Mild  [BLE]  Hair Reviewed  Eyes Reviewed  Mouth Reviewed  Skin Reviewed  Nails Reviewed       Diet Order:   Diet Order             Diet regular Room service appropriate? Yes; Fluid consistency: Thin  Diet effective now                   EDUCATION NEEDS:   Education needs have been addressed  Skin:  Skin Assessment: Skin Integrity Issues: Incisions: L chest  Last BM:  08/22/21 type 6  Height:   Ht Readings from Last 1 Encounters:  08/15/21 5\' 10"  (1.778 m)    Weight:   Wt Readings from Last 1 Encounters:  08/21/21 75.1 kg    BMI:  Body mass index is 23.76 kg/m.  Estimated Nutritional Needs:   Kcal:  2200-2400  Protein:  110-125 grams  Fluid:  >/= 2.0 L    Gustavus Bryant, MS, RD, LDN Inpatient Clinical Dietitian Please see AM[facility] for contact information.

## 2021-08-22 NOTE — Plan of Care (Signed)
Discussed with patient plan of care for the evening, pain management and bedtime medications with some teach back displayed.  Problem: Education: Goal: Knowledge of General Education information will improve Description: Including pain rating scale, medication(s)/side effects and non-pharmacologic comfort measures Outcome: Progressing   Problem: Health Behavior/Discharge Planning: Goal: Ability to manage health-related needs will improve Outcome: Progressing   

## 2021-08-22 NOTE — Progress Notes (Addendum)
° °   °  Philip SouthSuite 411       Philip Trevino [id number]             [id number]      2 Days Post-Op Procedure(s) (LRB): VIDEO ASSISTED THORACOSCOPY (VATS)/EMPYEMA (Left)  Subjective:  Patient up in chair, complains of pain  Objective: Vital signs in last 24 hours: Temp:  [97.8 F (36.6 C)-98.6 F (37 C)] 97.8 F (36.6 C) (02/01 0353) Pulse Rate:  [73-90] 87 (02/01 0736) Cardiac Rhythm: Normal sinus rhythm (02/01 0721) Resp:  [13-22] 13 (02/01 0736) BP: (127-143)/(97-107) 138/105 (02/01 0736) SpO2:  [96 %-100 %] 97 % (02/01 0736)  Intake/Output from previous day: 01/31 0701 - 02/01 0700 In: 1154 [P.O.:600; IV Piggyback:554] Out: 2505 [Urine:2385; Chest Tube:120] Intake/Output this shift: Total I/O In: -  Out: 605 [Urine:575; Chest Tube:30]  General appearance: alert, cooperative, and no distress Heart: regular rate and rhythm Lungs: diminished breath sounds bibasilar Abdomen: soft, non-tender; bowel sounds normal; no masses,  no organomegaly Extremities: extremities normal, atraumatic, no cyanosis or edema Wound: clean and dry  Lab Results: Recent Labs    08/21/21 0328 08/22/21 0106  WBC 20.2* 16.3*  HGB 9.2* 8.1*  HCT 27.7* 25.2*  PLT 826* 746*   BMET:  Recent Labs    08/21/21 0328 08/22/21 0106  NA 137 140  K 4.7 3.9  CL 106 107  CO2 21* 22  GLUCOSE 127* 103*  BUN 58* 57*  CREATININE 5.09* 4.79*  CALCIUM 7.4* 7.4*    PT/INR: No results for input(s): LABPROT, INR in the last 72 hours. ABG    Component Value Date/Time   PHART 7.413 08/21/2021 0327   HCO3 24.4 08/21/2021 0327   TCO2 26 08/21/2021 0327   O2SAT 98.0 08/21/2021 0327   CBG (last 3)  No results for input(s): GLUCAP in the last 72 hours.  Assessment/Plan: S/P Procedure(s) (LRB): VIDEO ASSISTED THORACOSCOPY (VATS)/EMPYEMA (Left)  CV- hemodynamically stable in NSR Pulm- S/P VATS drainage of empyema, CT output was 120 yesterday, CXR with improvement of effusion.Marland Kitchen leave  chest tubes in place ID- afebrile, ABX per medicine Renal- FSGS, Nephrology following Dispo-  care per medicine, leave chest tubes in place   LOS: 8 days    Ellwood Handler, PA-C 08/22/2021  Chest x-ray with much improved aeration of left base Image from left chest tube tapering off but with the extreme purulent nature of the material drained we will leave the tube in longer than standard care. Creatinine and white count both showing some improvement  patient examined and medical record reviewed,agree with above note. Dahlia Byes 08/22/2021

## 2021-08-22 NOTE — Progress Notes (Signed)
PROGRESS NOTE    Philip Trevino  JSH:[id number] DOB: Dec 06, 1995 DOA: 08/14/2021 PCP: Kerin Perna, NP   Brief Narrative: 26 year old past medical history significant for focal segmental glomerulosclerosis, minimal-change disease, hypertension presents with multiple complaints.  Over the last 2 or 3 weeks he noticed loss of taste, fatigue, weakness.  Pleuritic chest pain.  Chest congestion.  Subjective fever.  Multiple episodes of diarrhea.  He was supposed to follow-up with nephrology, but due to transportation issue never follow-up.  Not taking any home medications.  Evaluation in the ED: COVID-negative, BUN 76, creatinine 6.4 albumin less than 1.5, total protein 5.9.  Chest x-ray with bibasilar chest density left greater than the right.  Finding suggestive of pleural effusion compressive atelectasis.    Patient spike fever, WBC continue to be elevated. CT chest showed BL pleural effusion/ loculated. CCM consulted for thoracentesis. CCM performed bedside US which showed bilateral loculated effusion without clear window. Recommendation was for IR chest tube placement.   Left side pleural effusion didn't improved with chest tube or lytics. CVTS consulted, he underwent Left VATS with drainage of left Empyema and decortication of the lower lobe on 1/30.  Assessment & Plan:   Principal Problem:   Acute on chronic renal failure (HCC) Active Problems:   Anemia, unspecified   Benign essential HTN   Leukocytosis   Hyperlipidemia   Pleural effusion, bilateral   1-AKI in the setting of FSG: Hyponatremia; in setting renal failure -Renal biopsy-proven FSGS with frequent flares but usually associated with medication noncompliance -Normal creatinine October, November, 2 weeks ago creatinine 2.2 presenting with a creatinine over 6. -AKI related to ATN on top of FSGS.  -Appreciate nephrology assistance. -Nephrology considering further alternative agent to treat  FSG. Infection will need to be  resolved/controlled.  -On  IV  lasix. Plan for IV albumin. Renal function stable cr 5.0  Bilateral Empyema:  Patient with Leukocytosis, Fever, BL loculated pleural effusion.  -Chest x ray with atelectasis vs consolidation. -Spike fever 1/27. CT chest 1/28 showed worsening BL pleural effusion left worse than right, loculated, consider empyema.  -CCM consulted, no clear window for safe thoracentesis, concern for empyema. -IR consulted for Chest tube placement. Underwent BL chest tube placement 1/28.  -Pleural fluid consistent with empyema, awaiting triglycerides level.  -Pleural fluid growing streptococcus.  -WBC spike today, suspect post Sx. Follow trend.  -Underwent VATS of Left pleural effusion. Fail pleural fibrinolytic on the left.  -Dr Nils Pyle following.  -Pain management per CVTS.  -Ok to change zosyn to ceftriaxone per CCM. I will also Add flagyl to cover anaerobes.   Hyperlipidemia:  Continue with Zetia and Lipitor  Hypertension:  on Lasix. Hold lisinopril due to AKI.   Anemia /thrombocytosis: On Aranesp.  Hb stable.   Thrombocytosis, leukocytosis; related to infection.   Hypoalbuminemia -Suspect related to hypoalbuminemia, in setting of FSG. -IV albumin ordered.    Elevation of troponin: Likely demand in the setting of renal failure  Diarrhea:  Reported 2 episode of loose stool since yesterday Resolved.   Failure to thrive: Likely in the setting of AKI And infection.  HIV nonreactive     Estimated body mass index is 23.76 kg/m as calculated from the following:   Height as of this encounter: 5\' 10"  (1.778 m).   Weight as of this encounter: 75.1 kg.   DVT prophylaxis: SCDs, Heparin  Code Status: Full code Family Communication: Care discussed with patient Disposition Plan:  Status is: Inpatient  Remains inpatient appropriate because: Management  for acute renal failure    Consultants:  Nephrology  Procedures:  VATS left  Antimicrobials:     Subjective: Up in recliner appearing comfortable  Objective: Vitals:   08/21/21 1955 08/21/21 2349 08/22/21 0353 08/22/21 0736  BP: (!) 134/100 (!) 143/107 (!) 138/101 (!) 138/105  Pulse: 85 87 85 87  Resp: 17 18 18 13   Temp: 98.1 F (36.7 C) 98.6 F (37 C) 97.8 F (36.6 C)   TempSrc: Oral Oral Oral   SpO2: 98% 98% 97% 97%  Weight:      Height:        Intake/Output Summary (Last 24 hours) at 08/22/2021 1009 Last data filed at 08/22/2021 4034 Gross per 24 hour  Intake 1006.49 ml  Output 3050 ml  Net -2043.51 ml   Filed Weights   08/15/21 1400 08/21/21 0652  Weight: 78.8 kg 75.1 kg    Examination:  General exam: SAlert Respiratory system: BL ronchus. BL chest tube in place.  Cardiovascular system: S 1, S 2 RRR Gastrointestinal system: BS present, soft, nt Central nervous system: Non focal.  Extremities: No edema   Data Reviewed: I have personally reviewed following labs and imaging studies  CBC: Recent Labs  Lab 08/16/21 0256 08/17/21 0140 08/18/21 0157 08/19/21 0121 08/20/21 0051 08/21/21 0327 08/21/21 0328 08/22/21 0106  WBC 24.8*   < > 24.4* 18.1* 13.3*  --  20.2* 16.3*  NEUTROABS 21.0*  --   --   --   --   --   --   --   HGB 8.7*   < > 8.2* 9.1* 8.3* 8.8* 9.2* 8.1*  HCT 26.4*   < > 24.3* 26.9* 25.8* 26.0* 27.7* 25.2*  MCV 87.4   < > 87.1 86.8 87.8  --  87.7 88.1  PLT 908*   < > 788* 871* 860*  --  826* 746*   < > = values in this interval not displayed.   Basic Metabolic Panel: Recent Labs  Lab 08/17/21 0140 08/18/21 0157 08/19/21 0121 08/20/21 0051 08/21/21 0327 08/21/21 0328 08/22/21 0106  NA 130* 133* 134* 137 139 137 140  K 4.6 4.8 4.8 4.6 5.0 4.7 3.9  CL 102 104 104 107  --  106 107  CO2 18* 19* 20* 22  --  21* 22  GLUCOSE 102* 122* 114* 110*  --  127* 103*  BUN 75* 75* 70* 62*  --  58* 57*  CREATININE 5.91* 6.20* 5.79* 5.37*  --  5.09* 4.79*  CALCIUM 7.0* 7.0* 7.0* 7.4*  --  7.4* 7.4*  PHOS 9.1* 9.1* 9.0* 9.4*  --  9.4*  --     GFR: Estimated Creatinine Clearance: 24.6 mL/min (A) (by C-G formula based on SCr of 4.79 mg/dL (H)). Liver Function Tests: Recent Labs  Lab 08/18/21 0157 08/19/21 0121 08/20/21 0051 08/21/21 0328 08/22/21 0106  AST  --   --   --   --  17  ALT  --   --   --   --  8  ALKPHOS  --   --   --   --  49  BILITOT  --   --   --   --  <0.1*  PROT  --   --   --   --  5.9*  ALBUMIN <1.5* <1.5* <1.5* <1.5* 1.6*   No results for input(s): LIPASE, AMYLASE in the last 168 hours. No results for input(s): AMMONIA in the last 168 hours. Coagulation Profile: Recent Labs  Lab 08/18/21  1639  INR 1.3*   Cardiac Enzymes: No results for input(s): CKTOTAL, CKMB, CKMBINDEX, TROPONINI in the last 168 hours. BNP (last 3 results) No results for input(s): PROBNP in the last 8760 hours. HbA1C: No results for input(s): HGBA1C in the last 72 hours. CBG: No results for input(s): GLUCAP in the last 168 hours. Lipid Profile: No results for input(s): CHOL, HDL, LDLCALC, TRIG, CHOLHDL, LDLDIRECT in the last 72 hours.  Thyroid Function Tests: No results for input(s): TSH, T4TOTAL, FREET4, T3FREE, THYROIDAB in the last 72 hours. Anemia Panel: No results for input(s): VITAMINB12, FOLATE, FERRITIN, TIBC, IRON, RETICCTPCT in the last 72 hours. Sepsis Labs: No results for input(s): PROCALCITON, LATICACIDVEN in the last 168 hours.  Recent Results (from the past 240 hour(s))  Resp Panel by RT-PCR (Flu A&B, Covid) Nasopharyngeal Swab     Status: None   Collection Time: 08/14/21  4:26 PM   Specimen: Nasopharyngeal Swab; Nasopharyngeal(NP) swabs in vial transport medium  Result Value Ref Range Status   SARS Coronavirus 2 by RT PCR NEGATIVE NEGATIVE Final    Comment: (NOTE) SARS-CoV-2 target nucleic acids are NOT DETECTED.  The SARS-CoV-2 RNA is generally detectable in upper respiratory specimens during the acute phase of infection. The lowest concentration of SARS-CoV-2 viral copies this assay can detect  is 138 copies/mL. A negative result does not preclude SARS-Cov-2 infection and should not be used as the sole basis for treatment or other patient management decisions. A negative result may occur with  improper specimen collection/handling, submission of specimen other than nasopharyngeal swab, presence of viral mutation(s) within the areas targeted by this assay, and inadequate number of viral copies(<138 copies/mL). A negative result must be combined with clinical observations, patient history, and epidemiological information. The expected result is Negative.  Fact Sheet for Patients:  EntrepreneurPulse.com.au  Fact Sheet for Healthcare Providers:  IncredibleEmployment.be  This test is no t yet approved or cleared by the [place] FDA and  has been authorized for detection and/or diagnosis of SARS-CoV-2 by FDA under an Emergency Use Authorization (EUA). This EUA will remain  in effect (meaning this test can be used) for the duration of the COVID-19 declaration under Section 564(b)(1) of the Act, 21 U.S.C.section 360bbb-3(b)(1), unless the authorization is terminated  or revoked sooner.       Influenza A by PCR NEGATIVE NEGATIVE Final   Influenza B by PCR NEGATIVE NEGATIVE Final    Comment: (NOTE) The Xpert Xpress SARS-CoV-2/FLU/RSV plus assay is intended as an aid in the diagnosis of influenza from Nasopharyngeal swab specimens and should not be used as a sole basis for treatment. Nasal washings and aspirates are unacceptable for Xpert Xpress SARS-CoV-2/FLU/RSV testing.  Fact Sheet for Patients: EntrepreneurPulse.com.au  Fact Sheet for Healthcare Providers: IncredibleEmployment.be  This test is not yet approved or cleared by the [place] FDA and has been authorized for detection and/or diagnosis of SARS-CoV-2 by FDA under an Emergency Use Authorization (EUA). This EUA will remain in effect  (meaning this test can be used) for the duration of the COVID-19 declaration under Section 564(b)(1) of the Act, 21 U.S.C. section 360bbb-3(b)(1), unless the authorization is terminated or revoked.  Performed at [facility], [place] [address], [place], [place] [address]   Culture, blood (routine x 2)     Status: None   Collection Time: 08/14/21  9:32 PM   Specimen: BLOOD RIGHT HAND  Result Value Ref Range Status   Specimen Description BLOOD RIGHT HAND  Final  Special Requests   Final    BOTTLES DRAWN AEROBIC AND ANAEROBIC Blood Culture results may not be optimal due to an inadequate volume of blood received in culture bottles   Culture   Final    NO GROWTH 5 DAYS Performed at [facility], [place] [address] [place], [place] [address]    Report Status 08/19/2021 FINAL  Final  Culture, blood (routine x 2)     Status: None   Collection Time: 08/14/21  9:33 PM   Specimen: BLOOD  Result Value Ref Range Status   Specimen Description BLOOD LEFT ANTECUBITAL  Final   Special Requests   Final    BOTTLES DRAWN AEROBIC AND ANAEROBIC Blood Culture adequate volume   Culture   Final    NO GROWTH 5 DAYS Performed at [facility], [place] [address] [place], [place] [address]    Report Status 08/19/2021 FINAL  Final  Urine Culture     Status: Abnormal   Collection Time: 08/16/21  9:38 AM   Specimen: Urine, Clean Catch  Result Value Ref Range Status   Specimen Description URINE, CLEAN CATCH  Final   Special Requests NONE  Final   Culture (A)  Final    <10,000 COLONIES/mL INSIGNIFICANT GROWTH Performed at [facility], [place] [address], [place], [place] [address]    Report Status 08/17/2021 FINAL  Final  Culture, blood (routine x 2)     Status: None (Preliminary result)   Collection Time: 08/18/21  9:13 AM   Specimen: BLOOD  Result Value Ref Range Status   Specimen Description BLOOD LEFT ANTECUBITAL  Final   Special Requests   Final    BOTTLES DRAWN AEROBIC AND ANAEROBIC  Blood Culture adequate volume   Culture   Final    NO GROWTH 3 DAYS Performed at [facility], [place] [address] [place], [place] [address]    Report Status PENDING  Incomplete  Culture, blood (routine x 2)     Status: None (Preliminary result)   Collection Time: 08/18/21  9:25 AM   Specimen: BLOOD LEFT HAND  Result Value Ref Range Status   Specimen Description BLOOD LEFT HAND  Final   Special Requests   Final    BOTTLES DRAWN AEROBIC AND ANAEROBIC Blood Culture adequate volume   Culture   Final    NO GROWTH 3 DAYS Performed at [facility], [place] [address], [place], [place] [address]    Report Status PENDING  Incomplete  Aerobic/Anaerobic Culture w Gram Stain (surgical/deep wound)     Status: None (Preliminary result)   Collection Time: 08/18/21  2:57 PM   Specimen: Pleural Fluid  Result Value Ref Range Status   Specimen Description PLEURAL FLUID  Final   Special Requests RIGHT CHEST  Final   Gram Stain   Final    ABUNDANT WBC PRESENT,BOTH PMN AND MONONUCLEAR NO ORGANISMS SEEN Performed at [facility], [place] [address] [place], [place] [address]    Culture   Final    RARE STREPTOCOCCUS PNEUMONIAE SUSCEPTIBILITIES PERFORMED ON PREVIOUS CULTURE WITHIN THE LAST 5 DAYS. NO ANAEROBES ISOLATED; CULTURE IN PROGRESS FOR 5 DAYS    Report Status PENDING  Incomplete  Aerobic/Anaerobic Culture w Gram Stain (surgical/deep wound)     Status: None (Preliminary result)   Collection Time: 08/18/21  2:58 PM   Specimen: Pleural Fluid  Result Value Ref Range Status   Specimen Description PLEURAL FLUID  Final   Special Requests  LEFT CHEST  Final   Gram Stain   Final    ABUNDANT WBC PRESENT,BOTH PMN AND MONONUCLEAR NO ORGANISMS SEEN Performed at [facility], [place] [address], [place], [place] [address]    Culture   Final    FEW STREPTOCOCCUS PNEUMONIAE CRITICAL RESULT CALLED TO, READ BACK BY AND VERIFIED WITH: RN K.SICKLES ON [id number] AT 1120 BY E.PARRISH NO ANAEROBES  ISOLATED; CULTURE IN PROGRESS FOR 5 DAYS    Report Status PENDING  Incomplete   Organism ID, Bacteria STREPTOCOCCUS PNEUMONIAE  Final      Susceptibility   Streptococcus pneumoniae - MIC*    ERYTHROMYCIN >=8 RESISTANT Resistant     LEVOFLOXACIN 0.5 SENSITIVE Sensitive     VANCOMYCIN <=0.12 SENSITIVE Sensitive     PENICILLIN (meningitis) 0.25 RESISTANT Resistant     PENO - penicillin 0.25      PENICILLIN (non-meningitis) 0.25 SENSITIVE Sensitive     PENICILLIN (oral) 0.25 INTERMEDIATE Intermediate     CEFTRIAXONE (non-meningitis) <=0.12 SENSITIVE Sensitive     CEFTRIAXONE (meningitis) <=0.12 SENSITIVE Sensitive     * FEW STREPTOCOCCUS PNEUMONIAE  Surgical pcr screen     Status: None   Collection Time: 08/20/21  7:56 AM   Specimen: Nasal Mucosa; Nasal Swab  Result Value Ref Range Status   MRSA, PCR NEGATIVE NEGATIVE Final   Staphylococcus aureus NEGATIVE NEGATIVE Final    Comment: (NOTE) The Xpert SA Assay (FDA approved for NASAL specimens in patients 33 years of age and older), is one component of a comprehensive surveillance program. It is not intended to diagnose infection nor to guide or monitor treatment. Performed at [facility], [place] [address], [place], [place] [address]   Body fluid culture w Gram Stain     Status: None (Preliminary result)   Collection Time: 08/20/21 10:58 AM   Specimen: PATH Other; Body Fluid  Result Value Ref Range Status   Specimen Description PLEURAL FLUID  Final   Special Requests NONE  Final   Gram Stain   Final    ABUNDANT WBC PRESENT, PREDOMINANTLY PMN NO ORGANISMS SEEN    Culture   Final    NO GROWTH 2 DAYS Performed at [facility], [address], [place], [place] [address]    Report Status PENDING  Incomplete  Aerobic/Anaerobic Culture w Gram Stain (surgical/deep wound)     Status: None (Preliminary result)   Collection Time: 08/20/21 11:02 AM   Specimen: PATH Other; Body Fluid  Result Value Ref Range Status   Specimen  Description PLEURAL  Final   Special Requests NONE  Final   Gram Stain   Final    ABUNDANT WBC PRESENT, PREDOMINANTLY PMN NO ORGANISMS SEEN    Culture   Final    NO GROWTH < 24 HOURS Performed at [facility], [address], [place],  [address]    Report Status PENDING  Incomplete          Radiology Studies: DG Chest Port 1 View  Result Date: 08/21/2021 CLINICAL DATA:  Pneumothorax EXAM: PORTABLE CHEST 1 VIEW COMPARISON:  Previous studies including the examination of 08/20/2021 FINDINGS: Transverse diameter of heart is increased. Central pulmonary vessels are prominent. There is a pigtail right chest tube with its tip in the medial right lower lung fields. There is a larger caliber left chest tube with its tip in the medial left mid lung fields. There is small loculated pneumothorax near the left lateral costophrenic angle. There is no  demonstrable apical pneumothorax. Subcutaneous emphysema is seen in the left chest wall. There are patchy infiltrates in both lower lung fields, more so on the left side. IMPRESSION: Small loculated left pneumothorax is seen in the lateral aspect of left lower lung fields. Small left pleural effusion. There are patchy infiltrates in both lower lung fields suggesting atelectasis/pneumonia. Electronically Signed   By: Elmer Picker M.D.   On: 08/21/2021 08:02   DG Chest Port 1 View  Result Date: 08/20/2021 CLINICAL DATA:  Pleural effusions EXAM: PORTABLE CHEST 1 VIEW COMPARISON:  Previous studies including the examination done earlier today FINDINGS: Transverse diameter of heart is increased. No significant changes seen in the right pleural pigtail catheter. There is interval improvement in aeration of right parahilar region suggesting decrease in effusion in the interlobar fissure. There is interval replacement of pigtail left chest tube with a larger caliber chest tube. There is improvement in aeration of left lower lung fields suggesting  significant decrease in left pleural effusion. There is small loculated pneumothorax in the lateral aspect of left lower lung fields. There is no definite left apical pneumothorax. Subcutaneous emphysema is seen in the left chest wall. IMPRESSION: There is significant interval decrease in the left pleural effusion after placement of larger caliber left chest tube. There is a small residual left pleural effusion and small loculated pneumothorax in the lateral aspect of left lower lung fields. Electronically Signed   By: Elmer Picker M.D.   On: 08/20/2021 13:40        Scheduled Meds:  acetaminophen  1,000 mg Oral Q6H   Or   acetaminophen (TYLENOL) oral liquid 160 mg/5 mL  1,000 mg Oral Q6H   atorvastatin  80 mg Oral Daily   darbepoetin (ARANESP) injection - NON-DIALYSIS  100 mcg Subcutaneous Q Thu-1800   ezetimibe  10 mg Oral Daily   furosemide  80 mg Intravenous Q6H   pantoprazole  40 mg Oral Daily   senna-docusate  1 tablet Oral QHS   Continuous Infusions:  albumin human     cefTRIAXone (ROCEPHIN)  IV Stopped (08/21/21 2241)   metronidazole 500 mg (08/22/21 0929)     LOS: 8 days    Time spent: 30 minutes.     Phillips Grout, MD [facility]   08/22/2021, 10:09 AM

## 2021-08-23 ENCOUNTER — Inpatient Hospital Stay (HOSPITAL_COMMUNITY): Payer: Medicaid Other

## 2021-08-23 DIAGNOSIS — Z4682 Encounter for fitting and adjustment of non-vascular catheter: Secondary | ICD-10-CM | POA: Diagnosis not present

## 2021-08-23 DIAGNOSIS — E440 Moderate protein-calorie malnutrition: Secondary | ICD-10-CM | POA: Insufficient documentation

## 2021-08-23 DIAGNOSIS — J869 Pyothorax without fistula: Secondary | ICD-10-CM

## 2021-08-23 DIAGNOSIS — Z9689 Presence of other specified functional implants: Secondary | ICD-10-CM

## 2021-08-23 LAB — BPAM RBC
Blood Product Expiration Date: 202302222359
Blood Product Expiration Date: 202302232359
ISSUE DATE / TIME: 202301300850
Unit Type and Rh: 6200
Unit Type and Rh: 6200

## 2021-08-23 LAB — RENAL FUNCTION PANEL
Albumin: 2.1 g/dL — ABNORMAL LOW (ref 3.5–5.0)
Anion gap: 10 (ref 5–15)
BUN: 42 mg/dL — ABNORMAL HIGH (ref 6–20)
CO2: 24 mmol/L (ref 22–32)
Calcium: 7.5 mg/dL — ABNORMAL LOW (ref 8.9–10.3)
Chloride: 106 mmol/L (ref 98–111)
Creatinine, Ser: 3.49 mg/dL — ABNORMAL HIGH (ref 0.61–1.24)
GFR, Estimated: 24 mL/min — ABNORMAL LOW
Glucose, Bld: 90 mg/dL (ref 70–99)
Phosphorus: 5.7 mg/dL — ABNORMAL HIGH (ref 2.5–4.6)
Potassium: 3.4 mmol/L — ABNORMAL LOW (ref 3.5–5.1)
Sodium: 140 mmol/L (ref 135–145)

## 2021-08-23 LAB — CBC WITH DIFFERENTIAL/PLATELET
Abs Immature Granulocytes: 0.69 10*3/uL — ABNORMAL HIGH (ref 0.00–0.07)
Basophils Absolute: 0.1 10*3/uL (ref 0.0–0.1)
Basophils Relative: 1 %
Eosinophils Absolute: 0.2 10*3/uL (ref 0.0–0.5)
Eosinophils Relative: 1 %
HCT: 25.3 % — ABNORMAL LOW (ref 39.0–52.0)
Hemoglobin: 8.4 g/dL — ABNORMAL LOW (ref 13.0–17.0)
Immature Granulocytes: 5 %
Lymphocytes Relative: 16 %
Lymphs Abs: 2.5 10*3/uL (ref 0.7–4.0)
MCH: 29.3 pg (ref 26.0–34.0)
MCHC: 33.2 g/dL (ref 30.0–36.0)
MCV: 88.2 fL (ref 80.0–100.0)
Monocytes Absolute: 1.6 10*3/uL — ABNORMAL HIGH (ref 0.1–1.0)
Monocytes Relative: 10 %
Neutro Abs: 10.3 10*3/uL — ABNORMAL HIGH (ref 1.7–7.7)
Neutrophils Relative %: 67 %
Platelets: 722 10*3/uL — ABNORMAL HIGH (ref 150–400)
RBC: 2.87 MIL/uL — ABNORMAL LOW (ref 4.22–5.81)
RDW: 12.4 % (ref 11.5–15.5)
WBC: 15.2 10*3/uL — ABNORMAL HIGH (ref 4.0–10.5)
nRBC: 0.3 % — ABNORMAL HIGH (ref 0.0–0.2)

## 2021-08-23 LAB — TYPE AND SCREEN
ABO/RH(D): A POS
Antibody Screen: NEGATIVE
Unit division: 0
Unit division: 0

## 2021-08-23 LAB — CULTURE, BLOOD (ROUTINE X 2)
Culture: NO GROWTH
Culture: NO GROWTH
Special Requests: ADEQUATE
Special Requests: ADEQUATE

## 2021-08-23 LAB — BODY FLUID CULTURE W GRAM STAIN: Culture: NO GROWTH

## 2021-08-23 LAB — AEROBIC/ANAEROBIC CULTURE W GRAM STAIN (SURGICAL/DEEP WOUND)

## 2021-08-23 MED ORDER — AMOXICILLIN-POT CLAVULANATE 875-125 MG PO TABS
1.0000 | ORAL_TABLET | Freq: Two times a day (BID) | ORAL | Status: DC
  Administered 2021-08-23 – 2021-08-25 (×4): 1 via ORAL
  Filled 2021-08-23 (×4): qty 1

## 2021-08-23 MED ORDER — BOOST / RESOURCE BREEZE PO LIQD CUSTOM
1.0000 | Freq: Three times a day (TID) | ORAL | Status: DC
  Administered 2021-08-23: 1 via ORAL

## 2021-08-23 MED ORDER — FUROSEMIDE 10 MG/ML IJ SOLN
80.0000 mg | Freq: Two times a day (BID) | INTRAMUSCULAR | Status: DC
  Administered 2021-08-23: 80 mg via INTRAVENOUS
  Filled 2021-08-23: qty 8

## 2021-08-23 MED ORDER — ALBUMIN HUMAN 25 % IV SOLN
25.0000 g | Freq: Four times a day (QID) | INTRAVENOUS | Status: AC
  Administered 2021-08-23 (×2): 25 g via INTRAVENOUS
  Filled 2021-08-23 (×2): qty 100

## 2021-08-23 NOTE — Progress Notes (Signed)
Subjective:   Patient having some left-sided chest pain today but otherwise no significant issues.  Urine output has been excellent.  Creatinine improved significantly in the past 24 hours  Objective Vital signs in last 24 hours: Vitals:   08/22/21 0736 08/22/21 1916 08/22/21 2349 08/23/21 0325  BP: (!) 138/105 (!) 138/107 (!) 161/113 (!) 151/100  Pulse: 87 88 79 95  Resp: 13 13 19 20   Temp:  97.9 F (36.6 C) 98.3 F (36.8 C) 98.2 F (36.8 C)  TempSrc:  Oral Oral Oral  SpO2: 97% 95% 98% 98%  Weight:      Height:       Weight change:   Intake/Output Summary (Last 24 hours) at 08/23/2021 3825 Last data filed at 08/23/2021 0539 Gross per 24 hour  Intake 540.02 ml  Output 3308 ml  Net -2767.98 ml     Assessment/Plan:25 ear old BM with history of FSG-  course complicated by frequent flaring and medication non compliance-  now with A on CRF and likely disease flaring  1.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  Creatinine was normal and in November 2022.  15 days prior to admission creatinine was around 2.2.  Felt that his acute kidney injury here with creatinine up to 6 was more likely ATN on top of his FSGS given the background of infection.  He has been treated with steroids in the past.    It is likely patient is going to need alternative immunotherapy after everything is said and done.  Not excited about a CNI right now given his active AKI.  Rituximab could be considered but would want to adequately treat his significant infection first.  Trying to avoid excessive steroids. -Continue to monitor creatinine daily -No further albumin today -Continue treatment of infection -Consider further immunosuppression; likely to take place outpatient   2. Hypertension/volume  -significant edema but overall improved.  Status post IV albumin.  Continue IV Lasix; consider switching to oral tomorrow 3.  Appetite sxms-has improved  with treatment of infection.  Unlikely uremia 4. Anemia  - indicates long term CKD vs GIB possibly due to long term steroids -  have put him on protonix -  and give dose of ESA - hgb around 8-9 5.  Elevated WBC/bilateral empyema-  fever. Leukopenia and pyuria-initially treated for UTI now with concern of bilateral empyema.  Appreciate primary and consultant management.  Status post thoracotomy and bilateral chest tubes.  Continue antibiotics per primary team; plan for a total of 6 weeks  Dispo: From a nephrology perspective the patient is stable for discharge with close follow-up in the outpatient setting.  However, we will continue to follow as inpatient to ensure his kidney function continues to improve. Not sure when he will leave given long term need for antibiotics.     Reesa Chew    Labs: Basic Metabolic Panel: Recent Labs  Lab 08/20/21 0051 08/21/21 0327 08/21/21 0328 08/22/21 0106 08/23/21 0049  NA 137   < > 137 140 140  K 4.6   < > 4.7 3.9 3.4*  CL 107  --  106 107 106  CO2 22  --  21* 22 24  GLUCOSE 110*  --  127* 103* 90  BUN 62*  --  58* 57* 42*  CREATININE 5.37*  --  5.09* 4.79* 3.49*  CALCIUM 7.4*  --  7.4* 7.4* 7.5*  PHOS 9.4*  --  9.4*  --  5.7*   < > =  values in this interval not displayed.   Liver Function Tests: Recent Labs  Lab 08/21/21 0328 08/22/21 0106 08/23/21 0049  AST  --  17  --   ALT  --  8  --   ALKPHOS  --  49  --   BILITOT  --  <0.1*  --   PROT  --  5.9*  --   ALBUMIN <1.5* 1.6* 2.1*   No results for input(s): LIPASE, AMYLASE in the last 168 hours.  No results for input(s): AMMONIA in the last 168 hours. CBC: Recent Labs  Lab 08/19/21 0121 08/20/21 0051 08/21/21 0327 08/21/21 0328 08/22/21 0106 08/23/21 0049  WBC 18.1* 13.3*  --  20.2* 16.3* 15.2*  NEUTROABS  --   --   --   --   --  10.3*  HGB 9.1* 8.3*   < > 9.2* 8.1* 8.4*  HCT 26.9* 25.8*   < > 27.7* 25.2* 25.3*  MCV 86.8 87.8  --  87.7 88.1 88.2  PLT 871* 860*  --   826* 746* 722*   < > = values in this interval not displayed.   Cardiac Enzymes: No results for input(s): CKTOTAL, CKMB, CKMBINDEX, TROPONINI in the last 168 hours. CBG: No results for input(s): GLUCAP in the last 168 hours.  Iron Studies: No results for input(s): IRON, TIBC, TRANSFERRIN, FERRITIN in the last 72 hours. Studies/Results: DG Chest Port 1 View  Result Date: 08/23/2021 CLINICAL DATA:  Chest pain. EXAM: PORTABLE CHEST 1 VIEW COMPARISON:  08/20/2021 FINDINGS: Trace right pleural effusion. Right-sided chest tube in unchanged position. Small left pleural effusion. Left-sided chest tube in satisfactory position. Left lower lobe airspace disease concerning for pneumonia. Hazy right lower lobe airspace disease may reflect atelectasis versus pneumonia. No pneumothorax. Stable cardiomediastinal silhouette. No aggressive osseous lesion. IMPRESSION: 1. Bilateral chest tubes in satisfactory position. 2. Trace right pleural effusion. Right lower lobe airspace disease which may reflect atelectasis versus pneumonia. 3. Small left pleural effusion. Left lower lobe airspace disease concerning for pneumonia. Electronically Signed   By: Kathreen Devoid M.D.   On: 08/23/2021 07:21   DG Chest Port 1 View  Result Date: 08/22/2021 CLINICAL DATA:  Follow up pleural effusions. Status post left-sided VATS for empyema. EXAM: PORTABLE CHEST 1 VIEW COMPARISON:  Radiographs 08/21/2021 and 08/20/2021.  CT 08/20/2021. FINDINGS: 0638 hours. Right pigtail and left surgical chest tubes remain in place inferiorly. Small loculated hydropneumothorax at the left costophrenic angle is unchanged. The overall aeration of the lungs has improved. The heart size and mediastinal contours are stable. There is stable soft tissue emphysema within the left lateral chest wall. IMPRESSION: Improving aeration of the lung bases. No significant change in volume of small left-sided hydropneumothorax. Electronically Signed   By: Richardean Sale M.D.    On: 08/22/2021 08:13   Medications: Infusions:  albumin human     cefTRIAXone (ROCEPHIN)  IV 2 g (08/22/21 2212)   metronidazole 500 mg (08/22/21 2256)     Scheduled Medications:  acetaminophen  1,000 mg Oral Q6H   Or   acetaminophen (TYLENOL) oral liquid 160 mg/5 mL  1,000 mg Oral Q6H   atorvastatin  80 mg Oral Daily   darbepoetin (ARANESP) injection - NON-DIALYSIS  100 mcg Subcutaneous Q Thu-1800   ezetimibe  10 mg Oral Daily   furosemide  80 mg Intravenous Q6H   pantoprazole  40 mg Oral Daily   senna-docusate  1 tablet Oral QHS    have reviewed scheduled and prn medications.  Physical Exam: General: Sitting in chair, no distress Heart: Tachycardia, regular rhythm Lungs: Bilateral chest rise, short quick breathing Abdomen: soft, non tender Extremities: 1+ pitting edema in the bilateral lower extremities, warm and well perfused    08/23/2021,8:53 AM  LOS: 9 days

## 2021-08-23 NOTE — TOC Progression Note (Signed)
Transition of Care [facility]) - Progression Note    Patient Details  Name: Philip Trevino MRN: [id number] Date of Birth: 07-05-1996  [facility]) CM/SW [place], RN Phone [phone]  08/23/2021, 3:08 PM  Clinical Narrative:    CM received message that patient is requesting assistance with PCP. Patient is currently active at [facility]. CM called to set up hospital follow up appointment. Per Evelina Dun at [facility] appointments are booked up for a month out and the he has been advised that he is to send message to clinical staff to work the patient in. The clinical staff will reach out to the patient for hospital follow up appointment date. This information will be added to the AVS.    Expected Discharge Plan: Home/Self Care Barriers to Discharge: Continued Medical Work up  Expected Discharge Plan and Services Expected Discharge Plan: Home/Self Care   Discharge Planning Services: CM Consult, Medication Assistance   Living arrangements for the past 2 months: Single Family Home                 DME Arranged: N/A DME Agency: NA       HH Arranged: NA HH Agency: NA         Social Determinants of Health (SDOH) Interventions    Readmission Risk Interventions Readmission Risk Prevention Plan 09/28/2020  Post Dischage Appt Complete  Medication Screening Complete  Transportation Screening Complete  Some recent data might be hidden

## 2021-08-23 NOTE — Progress Notes (Signed)
3 Days Post-Op Procedure(s) (LRB): VIDEO ASSISTED THORACOSCOPY (VATS)/EMPYEMA (Left) Subjective: Continues to do well with drainage of bilat empyema- VATS on L and pigtail cath on R CXR clear  200 cc chest tube output yesterday Renal fx also improving Objective: Vital signs in last 24 hours: Temp:  [97.9 F (36.6 C)-98.3 F (36.8 C)] 98.2 F (36.8 C) (02/02 0325) Pulse Rate:  [79-95] 95 (02/02 0325) Cardiac Rhythm: Normal sinus rhythm (02/02 0756) Resp:  [13-20] 20 (02/02 0325) BP: (138-161)/(100-113) 151/100 (02/02 0325) SpO2:  [95 %-98 %] 98 % (02/02 0325)  Hemodynamic parameters for last 24 hours:  stable  Intake/Output from previous day: 02/01 0701 - 02/02 0700 In: 540 [P.O.:240; IV Piggyback:300] Out: 8264 [Urine:3725; Chest Tube:188] Intake/Output this shift: Total I/O In: -  Out: 1150 [Urine:1150]  No air leak from chest tubes  Lab Results: Recent Labs    08/22/21 0106 08/23/21 0049  WBC 16.3* 15.2*  HGB 8.1* 8.4*  HCT 25.2* 25.3*  PLT 746* 722*   BMET:  Recent Labs    08/22/21 0106 08/23/21 0049  NA 140 140  K 3.9 3.4*  CL 107 106  CO2 22 24  GLUCOSE 103* 90  BUN 57* 42*  CREATININE 4.79* 3.49*  CALCIUM 7.4* 7.5*    PT/INR: No results for input(s): LABPROT, INR in the last 72 hours. ABG    Component Value Date/Time   PHART 7.413 08/21/2021 0327   HCO3 24.4 08/21/2021 0327   TCO2 26 08/21/2021 0327   O2SAT 98.0 08/21/2021 0327   CBG (last 3)  No results for input(s): GLUCAP in the last 72 hours.  Assessment/Plan: S/P Procedure(s) (LRB): VIDEO ASSISTED THORACOSCOPY (VATS)/EMPYEMA (Left) Cont chest tubes to water seal If output < 50 cc/dy will remove tube tomorrow  LOS: 9 days    Dahlia Byes 08/23/2021

## 2021-08-23 NOTE — Progress Notes (Signed)
Mobility Specialist Progress Note    08/23/21 1302  Mobility  Bed Position Chair  Activity Ambulated with assistance in hallway  Level of Assistance Contact guard assist, steadying assist  Assistive Device Front wheel walker  Distance Ambulated (ft) 820 ft  Activity Response Tolerated fair  $Mobility charge 1 Mobility   Pt received in bed and agreeable. Had BM in BR. No complaints during walk. Returned to chair with call bell in reach.   [facility] Mobility Specialist  M.S. 2C and 6E: [phone] M.S. 4E: [phone]

## 2021-08-23 NOTE — Progress Notes (Signed)
PROGRESS NOTE    Philip Trevino  XBD:[id number] DOB: [birth year] DOA: 08/14/2021 PCP: Kerin Perna, NP   Brief Narrative: 26 year old past medical history significant for focal segmental glomerulosclerosis, minimal-change disease, hypertension presents with multiple complaints.  Over the last 2 or 3 weeks he noticed loss of taste, fatigue, weakness.  Pleuritic chest pain.  Chest congestion.  Subjective fever.  Multiple episodes of diarrhea.  He was supposed to follow-up with nephrology, but due to transportation issue never follow-up.  Not taking any home medications.  Evaluation in the ED: COVID-negative, BUN 76, creatinine 6.4 albumin less than 1.5, total protein 5.9.  Chest x-ray with bibasilar chest density left greater than the right.  Finding suggestive of pleural effusion compressive atelectasis.    Patient spike fever, WBC continue to be elevated. CT chest showed BL pleural effusion/ loculated. CCM consulted for thoracentesis. CCM performed bedside US which showed bilateral loculated effusion without clear window. Recommendation was for IR chest tube placement.   Left side pleural effusion didn't improved with chest tube or lytics. CVTS consulted, he underwent Left VATS with drainage of left Empyema and decortication of the lower lobe on 1/30.  Assessment & Plan:   Principal Problem:   Acute on chronic renal failure (HCC) Active Problems:   Anemia, unspecified   Benign essential HTN   Leukocytosis   Hyperlipidemia   Pleural effusion, bilateral   Chest tube in place   Empyema [facility])   1-AKI in the setting of FSG: Hyponatremia; in setting renal failure -Renal biopsy-proven FSGS with frequent flares but usually associated with medication noncompliance -Normal creatinine October, November, 2 weeks ago creatinine 2.2 presenting with a creatinine over 6. -AKI related to ATN on top of FSGS.  -Appreciate nephrology assistance. -Nephrology considering further alternative agent to  treat  FSG. Infection will need to be resolved/controlled.  -On  IV  lasix.  Occasional IV albumin. Renal function stable cr continues to improve at 3.5 today  Bilateral Empyema:  Patient with Leukocytosis, Fever, BL loculated pleural effusion.  -Chest x ray with atelectasis vs consolidation. -Spike fever 1/27. CT chest 1/28 showed worsening BL pleural effusion left worse than right, loculated, consider empyema.  -CCM consulted, no clear window for safe thoracentesis, concern for empyema. -IR consulted for Chest tube placement. Underwent BL chest tube placement 1/28.  -Pleural fluid consistent with empyema, awaiting triglycerides level unsure if ever done.  -Pleural fluid growing streptococcus.  -Underwent VATS of Left pleural effusion. Fail pleural fibrinolytic on the left.  -Dr Nils Pyle following.  -Pain management per CVTS.  -Ok to change zosyn to ceftriaxone per CCM. I will also Add flagyl to cover anaerobes.  -We will need 4 weeks of antibiotics total per pulm  Hyperlipidemia:  Continue with Zetia and Lipitor  Hypertension:  on Lasix. Hold lisinopril due to AKI.   Anemia /thrombocytosis: On Aranesp.  Hb stable.   Thrombocytosis, leukocytosis; related to infection.   Hypoalbuminemia -Suspect related to hypoalbuminemia, in setting of FSG. -IV albumin.   Elevation of troponin: Likely demand in the setting of renal failure  Diarrhea:  Reported 2 episode of loose stool since yesterday Resolved.   Failure to thrive: Likely in the setting of AKI And infection.  HIV nonreactive     Estimated body mass index is 23.76 kg/m as calculated from the following:   Height as of this encounter: 5\' 10"  (1.778 m).   Weight as of this encounter: 75.1 kg.   DVT prophylaxis: SCDs, Heparin  Code Status: Full code  Family Communication: Care discussed with patient Disposition Plan:  Status is: Inpatient  Remains inpatient appropriate because: Management for acute renal  failure    Consultants:  Nephrology  Procedures:  VATS left  Antimicrobials:    Subjective: Still uncomfortable with a left chest tube otherwise eating well doing well  Objective: Vitals:   08/22/21 0736 08/22/21 1916 08/22/21 2349 08/23/21 0325  BP: (!) 138/105 (!) 138/107 (!) 161/113 (!) 151/100  Pulse: 87 88 79 95  Resp: 13 13 19 20   Temp:  97.9 F (36.6 C) 98.3 F (36.8 C) 98.2 F (36.8 C)  TempSrc:  Oral Oral Oral  SpO2: 97% 95% 98% 98%  Weight:      Height:        Intake/Output Summary (Last 24 hours) at 08/23/2021 1042 Last data filed at 08/23/2021 4132 Gross per 24 hour  Intake 540.02 ml  Output 3308 ml  Net -2767.98 ml   Filed Weights   08/15/21 1400 08/21/21 0652  Weight: 78.8 kg 75.1 kg    Examination:  General exam: SAlert Respiratory system: BL ronchus. BL chest tube in place.  Cardiovascular system: S 1, S 2 RRR Gastrointestinal system: BS present, soft, nt Central nervous system: Non focal.  Extremities: No edema   Data Reviewed: I have personally reviewed following labs and imaging studies  CBC: Recent Labs  Lab 08/19/21 0121 08/20/21 0051 08/21/21 0327 08/21/21 0328 08/22/21 0106 08/23/21 0049  WBC 18.1* 13.3*  --  20.2* 16.3* 15.2*  NEUTROABS  --   --   --   --   --  10.3*  HGB 9.1* 8.3* 8.8* 9.2* 8.1* 8.4*  HCT 26.9* 25.8* 26.0* 27.7* 25.2* 25.3*  MCV 86.8 87.8  --  87.7 88.1 88.2  PLT 871* 860*  --  826* 746* 440*   Basic Metabolic Panel: Recent Labs  Lab 08/18/21 0157 08/19/21 0121 08/20/21 0051 08/21/21 0327 08/21/21 0328 08/22/21 0106 08/23/21 0049  NA 133* 134* 137 139 137 140 140  K 4.8 4.8 4.6 5.0 4.7 3.9 3.4*  CL 104 104 107  --  106 107 106  CO2 19* 20* 22  --  21* 22 24  GLUCOSE 122* 114* 110*  --  127* 103* 90  BUN 75* 70* 62*  --  58* 57* 42*  CREATININE 6.20* 5.79* 5.37*  --  5.09* 4.79* 3.49*  CALCIUM 7.0* 7.0* 7.4*  --  7.4* 7.4* 7.5*  PHOS 9.1* 9.0* 9.4*  --  9.4*  --  5.7*   GFR: Estimated  Creatinine Clearance: 33.7 mL/min (A) (by C-G formula based on SCr of 3.49 mg/dL (H)). Liver Function Tests: Recent Labs  Lab 08/19/21 0121 08/20/21 0051 08/21/21 0328 08/22/21 0106 08/23/21 0049  AST  --   --   --  17  --   ALT  --   --   --  8  --   ALKPHOS  --   --   --  49  --   BILITOT  --   --   --  <0.1*  --   PROT  --   --   --  5.9*  --   ALBUMIN <1.5* <1.5* <1.5* 1.6* 2.1*   No results for input(s): LIPASE, AMYLASE in the last 168 hours. No results for input(s): AMMONIA in the last 168 hours. Coagulation Profile: Recent Labs  Lab 08/18/21 1639  INR 1.3*   Cardiac Enzymes: No results for input(s): CKTOTAL, CKMB, CKMBINDEX, TROPONINI in the last 168 hours.  BNP (last 3 results) No results for input(s): PROBNP in the last 8760 hours. HbA1C: No results for input(s): HGBA1C in the last 72 hours. CBG: No results for input(s): GLUCAP in the last 168 hours. Lipid Profile: No results for input(s): CHOL, HDL, LDLCALC, TRIG, CHOLHDL, LDLDIRECT in the last 72 hours.  Thyroid Function Tests: No results for input(s): TSH, T4TOTAL, FREET4, T3FREE, THYROIDAB in the last 72 hours. Anemia Panel: No results for input(s): VITAMINB12, FOLATE, FERRITIN, TIBC, IRON, RETICCTPCT in the last 72 hours. Sepsis Labs: No results for input(s): PROCALCITON, LATICACIDVEN in the last 168 hours.  Recent Results (from the past 240 hour(s))  Resp Panel by RT-PCR (Flu A&B, Covid) Nasopharyngeal Swab     Status: None   Collection Time: 08/14/21  4:26 PM   Specimen: Nasopharyngeal Swab; Nasopharyngeal(NP) swabs in vial transport medium  Result Value Ref Range Status   SARS Coronavirus 2 by RT PCR NEGATIVE NEGATIVE Final    Comment: (NOTE) SARS-CoV-2 target nucleic acids are NOT DETECTED.  The SARS-CoV-2 RNA is generally detectable in upper respiratory specimens during the acute phase of infection. The lowest concentration of SARS-CoV-2 viral copies this assay can detect is 138 copies/mL. A  negative result does not preclude SARS-Cov-2 infection and should not be used as the sole basis for treatment or other patient management decisions. A negative result may occur with  improper specimen collection/handling, submission of specimen other than nasopharyngeal swab, presence of viral mutation(s) within the areas targeted by this assay, and inadequate number of viral copies(<138 copies/mL). A negative result must be combined with clinical observations, patient history, and epidemiological information. The expected result is Negative.  Fact Sheet for Patients:  EntrepreneurPulse.com.au  Fact Sheet for Healthcare Providers:  IncredibleEmployment.be  This test is no t yet approved or cleared by the [place] FDA and  has been authorized for detection and/or diagnosis of SARS-CoV-2 by FDA under an Emergency Use Authorization (EUA). This EUA will remain  in effect (meaning this test can be used) for the duration of the COVID-19 declaration under Section 564(b)(1) of the Act, 21 U.S.C.section 360bbb-3(b)(1), unless the authorization is terminated  or revoked sooner.       Influenza A by PCR NEGATIVE NEGATIVE Final   Influenza B by PCR NEGATIVE NEGATIVE Final    Comment: (NOTE) The Xpert Xpress SARS-CoV-2/FLU/RSV plus assay is intended as an aid in the diagnosis of influenza from Nasopharyngeal swab specimens and should not be used as a sole basis for treatment. Nasal washings and aspirates are unacceptable for Xpert Xpress SARS-CoV-2/FLU/RSV testing.  Fact Sheet for Patients: EntrepreneurPulse.com.au  Fact Sheet for Healthcare Providers: IncredibleEmployment.be  This test is not yet approved or cleared by the [place] FDA and has been authorized for detection and/or diagnosis of SARS-CoV-2 by FDA under an Emergency Use Authorization (EUA). This EUA will remain in effect (meaning this test can  be used) for the duration of the COVID-19 declaration under Section 564(b)(1) of the Act, 21 U.S.C. section 360bbb-3(b)(1), unless the authorization is terminated or revoked.  Performed at [facility], [place] [address], [place], [place] [address]   Culture, blood (routine x 2)     Status: None   Collection Time: 08/14/21  9:32 PM   Specimen: BLOOD RIGHT HAND  Result Value Ref Range Status   Specimen Description BLOOD RIGHT HAND  Final   Special Requests   Final    BOTTLES DRAWN AEROBIC AND ANAEROBIC Blood Culture results may not be optimal due  to an inadequate volume of blood received in culture bottles   Culture   Final    NO GROWTH 5 DAYS Performed at [facility], [place] [address] [place], [place] [address]    Report Status 08/19/2021 FINAL  Final  Culture, blood (routine x 2)     Status: None   Collection Time: 08/14/21  9:33 PM   Specimen: BLOOD  Result Value Ref Range Status   Specimen Description BLOOD LEFT ANTECUBITAL  Final   Special Requests   Final    BOTTLES DRAWN AEROBIC AND ANAEROBIC Blood Culture adequate volume   Culture   Final    NO GROWTH 5 DAYS Performed at [facility], [place] [address] [place], [place] [address]    Report Status 08/19/2021 FINAL  Final  Urine Culture     Status: Abnormal   Collection Time: 08/16/21  9:38 AM   Specimen: Urine, Clean Catch  Result Value Ref Range Status   Specimen Description URINE, CLEAN CATCH  Final   Special Requests NONE  Final   Culture (A)  Final    <10,000 COLONIES/mL INSIGNIFICANT GROWTH Performed at [facility], [place] [address], [place], [place] [address]    Report Status 08/17/2021 FINAL  Final  Culture, blood (routine x 2)     Status: None   Collection Time: 08/18/21  9:13 AM   Specimen: BLOOD  Result Value Ref Range Status   Specimen Description BLOOD LEFT ANTECUBITAL  Final   Special Requests   Final    BOTTLES DRAWN AEROBIC AND ANAEROBIC Blood Culture adequate volume   Culture    Final    NO GROWTH 5 DAYS Performed at [facility], [place] [address] [place], [place] [address]    Report Status 08/23/2021 FINAL  Final  Culture, blood (routine x 2)     Status: None   Collection Time: 08/18/21  9:25 AM   Specimen: BLOOD LEFT HAND  Result Value Ref Range Status   Specimen Description BLOOD LEFT HAND  Final   Special Requests   Final    BOTTLES DRAWN AEROBIC AND ANAEROBIC Blood Culture adequate volume   Culture   Final    NO GROWTH 5 DAYS Performed at [facility], [place] [address], [place], [place] [address]    Report Status 08/23/2021 FINAL  Final  Aerobic/Anaerobic Culture w Gram Stain (surgical/deep wound)     Status: None (Preliminary result)   Collection Time: 08/18/21  2:57 PM   Specimen: Pleural Fluid  Result Value Ref Range Status   Specimen Description PLEURAL FLUID  Final   Special Requests RIGHT CHEST  Final   Gram Stain   Final    ABUNDANT WBC PRESENT,BOTH PMN AND MONONUCLEAR NO ORGANISMS SEEN Performed at [facility], [place] [address] [place],  [address]    Culture   Final    RARE STREPTOCOCCUS PNEUMONIAE SUSCEPTIBILITIES PERFORMED ON PREVIOUS CULTURE WITHIN THE LAST 5 DAYS. NO ANAEROBES ISOLATED; CULTURE IN PROGRESS FOR 5 DAYS    Report Status PENDING  Incomplete  Aerobic/Anaerobic Culture w Gram Stain (surgical/deep wound)     Status: None (Preliminary result)   Collection Time: 08/18/21  2:58 PM   Specimen: Pleural Fluid  Result Value Ref Range Status   Specimen Description PLEURAL FLUID  Final   Special Requests LEFT CHEST  Final   Gram Stain   Final    ABUNDANT WBC PRESENT,BOTH PMN AND MONONUCLEAR NO ORGANISMS SEEN  Performed at [facility], [place] [address], [place], [place] [address]    Culture   Final    FEW STREPTOCOCCUS PNEUMONIAE CRITICAL RESULT CALLED TO, READ BACK BY AND VERIFIED WITH: RN K.SICKLES ON [id number] AT 1120 BY E.PARRISH NO ANAEROBES ISOLATED; CULTURE IN PROGRESS FOR 5 DAYS    Report  Status PENDING  Incomplete   Organism ID, Bacteria STREPTOCOCCUS PNEUMONIAE  Final      Susceptibility   Streptococcus pneumoniae - MIC*    ERYTHROMYCIN >=8 RESISTANT Resistant     LEVOFLOXACIN 0.5 SENSITIVE Sensitive     VANCOMYCIN <=0.12 SENSITIVE Sensitive     PENICILLIN (meningitis) 0.25 RESISTANT Resistant     PENO - penicillin 0.25      PENICILLIN (non-meningitis) 0.25 SENSITIVE Sensitive     PENICILLIN (oral) 0.25 INTERMEDIATE Intermediate     CEFTRIAXONE (non-meningitis) <=0.12 SENSITIVE Sensitive     CEFTRIAXONE (meningitis) <=0.12 SENSITIVE Sensitive     * FEW STREPTOCOCCUS PNEUMONIAE  Surgical pcr screen     Status: None   Collection Time: 08/20/21  7:56 AM   Specimen: Nasal Mucosa; Nasal Swab  Result Value Ref Range Status   MRSA, PCR NEGATIVE NEGATIVE Final   Staphylococcus aureus NEGATIVE NEGATIVE Final    Comment: (NOTE) The Xpert SA Assay (FDA approved for NASAL specimens in patients 39 years of age and older), is one component of a comprehensive surveillance program. It is not intended to diagnose infection nor to guide or monitor treatment. Performed at [facility], [place] [address] [place], [place] [address]   Body fluid culture w Gram Stain     Status: None (Preliminary result)   Collection Time: 08/20/21 10:58 AM   Specimen: PATH Other; Body Fluid  Result Value Ref Range Status   Specimen Description PLEURAL FLUID  Final   Special Requests NONE  Final   Gram Stain   Final    ABUNDANT WBC PRESENT, PREDOMINANTLY PMN NO ORGANISMS SEEN    Culture   Final    NO GROWTH 3 DAYS Performed at [facility], [address], [place], [place] [address]    Report Status PENDING  Incomplete  Aerobic/Anaerobic Culture w Gram Stain (surgical/deep wound)     Status: None (Preliminary result)   Collection Time: 08/20/21 11:02 AM   Specimen: PATH Other; Body Fluid  Result Value Ref Range Status   Specimen Description PLEURAL  Final   Special Requests NONE   Final   Gram Stain   Final    ABUNDANT WBC PRESENT, PREDOMINANTLY PMN NO ORGANISMS SEEN    Culture   Final    NO GROWTH 2 DAYS NO ANAEROBES ISOLATED; CULTURE IN PROGRESS FOR 5 DAYS Performed at [facility], [address], [place], [place] [address]    Report Status PENDING  Incomplete          Radiology Studies: DG Chest Port 1 View  Result Date: 08/23/2021 CLINICAL DATA:  Chest pain. EXAM: PORTABLE CHEST 1 VIEW COMPARISON:  08/20/2021 FINDINGS: Trace right pleural effusion. Right-sided chest tube in unchanged position. Small left pleural effusion. Left-sided chest tube in satisfactory position. Left lower lobe airspace disease concerning for pneumonia. Hazy right lower lobe airspace disease may reflect atelectasis versus pneumonia. No pneumothorax. Stable cardiomediastinal silhouette. No aggressive osseous lesion. IMPRESSION: 1. Bilateral chest tubes in satisfactory position. 2. Trace right pleural effusion. Right lower lobe airspace disease which may reflect atelectasis versus pneumonia. 3. Small left pleural effusion. Left lower lobe  airspace disease concerning for pneumonia. Electronically Signed   By: Kathreen Devoid M.D.   On: 08/23/2021 07:21   DG Chest Port 1 View  Result Date: 08/22/2021 CLINICAL DATA:  Follow up pleural effusions. Status post left-sided VATS for empyema. EXAM: PORTABLE CHEST 1 VIEW COMPARISON:  Radiographs 08/21/2021 and 08/20/2021.  CT 08/20/2021. FINDINGS: 0638 hours. Right pigtail and left surgical chest tubes remain in place inferiorly. Small loculated hydropneumothorax at the left costophrenic angle is unchanged. The overall aeration of the lungs has improved. The heart size and mediastinal contours are stable. There is stable soft tissue emphysema within the left lateral chest wall. IMPRESSION: Improving aeration of the lung bases. No significant change in volume of small left-sided hydropneumothorax. Electronically Signed   By: Richardean Sale M.D.   On:  08/22/2021 08:13        Scheduled Meds:  acetaminophen  1,000 mg Oral Q6H   Or   acetaminophen (TYLENOL) oral liquid 160 mg/5 mL  1,000 mg Oral Q6H   atorvastatin  80 mg Oral Daily   darbepoetin (ARANESP) injection - NON-DIALYSIS  100 mcg Subcutaneous Q Thu-1800   ezetimibe  10 mg Oral Daily   feeding supplement  1 Container Oral TID BM   furosemide  80 mg Intravenous BID   pantoprazole  40 mg Oral Daily   senna-docusate  1 tablet Oral QHS   Continuous Infusions:  albumin human 25 g (08/23/21 0944)   cefTRIAXone (ROCEPHIN)  IV 2 g (08/22/21 2212)   metronidazole 500 mg (08/23/21 0954)     LOS: 9 days    Time spent: 35 minutes   Ehab Humber A, MD [facility]   08/23/2021, 10:42 AM

## 2021-08-23 NOTE — Progress Notes (Signed)
° °  Name: Philip Trevino MRN: [id number] DOB: 12/11/1995    ADMISSION DATE:  08/14/2021 CONSULTATION DATE:  08/23/2021   REFERRING MD :  Greig Castilla  CHIEF COMPLAINT: Bilateral pleural effusions, chest pain, fever   HISTORY OF PRESENT ILLNESS: 26 year old man with a diagnosis of renal biopsy-proven FSGS lost to renal follow-up over the past year, admitted 1/24 with generalized weakness and pleuritic chest pain.  He reports a cough minimally productive of clear sputum and bilateral chest pain when he would cough or take deep breaths.  Reports preceding URI symptoms and tested negative for COVID and flu x2, no sick contacts.  He tried marijuana to increase his appetite.  Initial labs showed BUN/creatinine of 76/6.4, baseline noted to be 2.2 about 3 weeks ago.  WBC count was 20 5K with hemoglobin of 8.7 Chest x-ray showed bibasal effusions  He was initially given IV fluids then developed fever, urine culture showed less than 10K organisms, started ceftriaxone and azithromycin to cover UTI and pneumonia.  HIV negative.  CT chest without contrast 1/28 showed large left and moderate right, partially loculated pleural effusions.  Mass effect on underlying lung with passive atelectasis Hence PCCM consulted  He states that he has not seen a nephrologist over the past year since he was "bouncing around"  Significant tests/ events reviewed 1/28 CT-guided bilateral chest tubes 1/30 left VATs  SUBJECTIVE:  Reports improved pain and was able to ambulate several times around the unit yesterday evening    VITAL SIGNS: Temp:  [97.9 F (36.6 C)-98.3 F (36.8 C)] 98.2 F (36.8 C) (02/02 0325) Pulse Rate:  [79-95] 95 (02/02 0325) Resp:  [13-20] 20 (02/02 0325) BP: (138-161)/(100-113) 151/100 (02/02 0325) SpO2:  [95 %-98 %] 98 % (02/02 0325) Room Air  PHYSICAL EXAMINATION:  General: Thin well appearing adult male lying in bed in NAD  HEENT: [place]/AT, MM pink/moist, PERRL,  Neuro: Alert and  oriented x3 CV: s1s2 regular rate and rhythm, no murmur, rubs, or gallops,  PULM:  Clear to ascultation bilaterally, no increased work of breathing, no added breath sounds  GI: soft, bowel sounds active in all 4 quadrants, non-tender, non-distended, tolerating oral diet Extremities: warm/dry, no edema  Skin: no rashes or lesions  Resolved problem list   Acute respiratory failure with hypoxia  ASSESSMENT / PLAN:  Acute bilateral lower lobe pneumonia Bilateral empyema-- pneumococcus. 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    Pulm problem list  Bilateral Pneumococcal PNA w/ bilateral Empyema. S/p left VAT 1/30 and right chest tube placement w/ 1 round of Lytics on right.   -CXR improved  -179ml documented over the last 24hrs  -pneumococcal seen on culture (sensitive to CTX) -chest tube output minimal  Plan/rec Continued right chest tube until output is less than 78mls per day.  Right chest tube management per CVTS Encourage frequent pulmonary hygiene  Continue to encourage mobilization  Ensure adequate pain control to avoid pulmonary splinting Continue antibiotics with plan for prolonged course (estimate 6 weeks), currently on day 9   [name] D. Kenton Kingfisher, NP-C [facility] Personal contact information can be found on Amion  08/23/2021, 7:38 AM

## 2021-08-23 NOTE — TOC Progression Note (Signed)
Transition of Care [facility]) - Progression Note    Patient Details  Name: Philip Trevino MRN: [id number] Date of Birth: Nov 13, 1995  Transition of Care [facility]) CM/SW [place], [place] Phone Number: 08/23/2021, 2:57 PM  Clinical Narrative:     CSW spoke with pt at bedside and provided resources for applying for disability. Pt is frustrated with the process and his current condition. He requests assistance with setting up a PCP. CSW consulted CM who will follow up. TOC will continue to follow.  Expected Discharge Plan: Home/Self Care Barriers to Discharge: Continued Medical Work up  Expected Discharge Plan and Services Expected Discharge Plan: Home/Self Care   Discharge Planning Services: CM Consult, Medication Assistance   Living arrangements for the past 2 months: Single Family Home                 DME Arranged: N/A DME Agency: NA       HH Arranged: NA HH Agency: NA         Social Determinants of Health (SDOH) Interventions    Readmission Risk Interventions Readmission Risk Prevention Plan 09/28/2020  Post Dischage Appt Complete  Medication Screening Complete  Transportation Screening Complete  Some recent data might be hidden

## 2021-08-24 ENCOUNTER — Inpatient Hospital Stay (HOSPITAL_COMMUNITY): Payer: Medicaid Other

## 2021-08-24 DIAGNOSIS — J869 Pyothorax without fistula: Secondary | ICD-10-CM | POA: Diagnosis not present

## 2021-08-24 LAB — CBC WITH DIFFERENTIAL/PLATELET
Abs Immature Granulocytes: 0.53 10*3/uL — ABNORMAL HIGH (ref 0.00–0.07)
Basophils Absolute: 0.1 10*3/uL (ref 0.0–0.1)
Basophils Relative: 1 %
Eosinophils Absolute: 0.3 10*3/uL (ref 0.0–0.5)
Eosinophils Relative: 2 %
HCT: 23.9 % — ABNORMAL LOW (ref 39.0–52.0)
Hemoglobin: 8 g/dL — ABNORMAL LOW (ref 13.0–17.0)
Immature Granulocytes: 4 %
Lymphocytes Relative: 19 %
Lymphs Abs: 2.4 10*3/uL (ref 0.7–4.0)
MCH: 29.6 pg (ref 26.0–34.0)
MCHC: 33.5 g/dL (ref 30.0–36.0)
MCV: 88.5 fL (ref 80.0–100.0)
Monocytes Absolute: 1.6 10*3/uL — ABNORMAL HIGH (ref 0.1–1.0)
Monocytes Relative: 12 %
Neutro Abs: 7.9 10*3/uL — ABNORMAL HIGH (ref 1.7–7.7)
Neutrophils Relative %: 62 %
Platelets: 655 10*3/uL — ABNORMAL HIGH (ref 150–400)
RBC: 2.7 MIL/uL — ABNORMAL LOW (ref 4.22–5.81)
RDW: 12.7 % (ref 11.5–15.5)
WBC: 12.8 10*3/uL — ABNORMAL HIGH (ref 4.0–10.5)
nRBC: 0.2 % (ref 0.0–0.2)

## 2021-08-24 LAB — RENAL FUNCTION PANEL
Albumin: 1.9 g/dL — ABNORMAL LOW (ref 3.5–5.0)
Anion gap: 8 (ref 5–15)
BUN: 27 mg/dL — ABNORMAL HIGH (ref 6–20)
CO2: 25 mmol/L (ref 22–32)
Calcium: 7.3 mg/dL — ABNORMAL LOW (ref 8.9–10.3)
Chloride: 106 mmol/L (ref 98–111)
Creatinine, Ser: 2.63 mg/dL — ABNORMAL HIGH (ref 0.61–1.24)
GFR, Estimated: 34 mL/min — ABNORMAL LOW (ref >60)
Glucose, Bld: 87 mg/dL (ref 70–99)
Phosphorus: 4.8 mg/dL — ABNORMAL HIGH (ref 2.5–4.6)
Potassium: 3.3 mmol/L — ABNORMAL LOW (ref 3.5–5.1)
Sodium: 139 mmol/L (ref 135–145)

## 2021-08-24 LAB — ACID FAST SMEAR (AFB, MYCOBACTERIA): Acid Fast Smear: NEGATIVE

## 2021-08-24 MED ORDER — FUROSEMIDE 40 MG PO TABS
80.0000 mg | ORAL_TABLET | Freq: Two times a day (BID) | ORAL | Status: DC
  Administered 2021-08-24 – 2021-08-25 (×3): 80 mg via ORAL
  Filled 2021-08-24 (×3): qty 2

## 2021-08-24 MED ORDER — POTASSIUM CHLORIDE CRYS ER 20 MEQ PO TBCR
40.0000 meq | EXTENDED_RELEASE_TABLET | Freq: Once | ORAL | Status: AC
  Administered 2021-08-24: 40 meq via ORAL
  Filled 2021-08-24: qty 2

## 2021-08-24 MED ORDER — FE FUMARATE-B12-VIT C-FA-IFC PO CAPS
1.0000 | ORAL_CAPSULE | Freq: Three times a day (TID) | ORAL | Status: DC
  Administered 2021-08-24 – 2021-08-25 (×4): 1 via ORAL
  Filled 2021-08-24 (×6): qty 1

## 2021-08-24 NOTE — Progress Notes (Signed)
Mobility Specialist Progress Note    08/24/21 1038  Mobility  Activity Ambulated independently in hallway  Level of Assistance Standby assist, set-up cues, supervision of patient - no hands on  Assistive Device None  Distance Ambulated (ft) 820 ft  Activity Response Tolerated well  $Mobility charge 1 Mobility   Pre-Mobility: 96 HR During Mobility: 107 HR Post-Mobility: 92 HR  Pt received in bed and agreeable. No complaints on walk. Returned to [facility] for void then bed. Left with call bell in reach.   [facility] Mobility Specialist  M.S. 2C and 6E: [phone] M.S. 4E: [phone]

## 2021-08-24 NOTE — Progress Notes (Addendum)
° °   °  [address]       [place] [address]             [phone]       4 Days Post-Op Procedure(s) (LRB): VIDEO ASSISTED THORACOSCOPY (VATS)/EMPYEMA (Left)  Subjective: Patient feeling better. He is glad chest tubes have been removed.  Objective: Vital signs in last 24 hours: Temp:  [98.3 F (36.8 C)-98.6 F (37 C)] 98.6 F (37 C) (02/03 0821) Pulse Rate:  [77] 77 (02/03 0821) Cardiac Rhythm: Normal sinus rhythm (02/03 0721) Resp:  [15-20] 18 (02/03 0821) BP: (136-149)/(88-104) 149/104 (02/03 0821) SpO2:  [95 %-97 %] 97 % (02/03 0821)     Intake/Output from previous day: 02/02 0701 - 02/03 0700 In: 440 [P.O.:240; IV Piggyback:200] Out: 2575 [Urine:2525; Chest Tube:50]   Physical Exam:  Cardiovascular: RRR Pulmonary: Clear to auscultation bilaterally Abdomen: Soft, non tender, bowel sounds present. Extremities: No lower extremity edema. Wounds: Clean and dry.  No erythema or signs of infection.   Lab Results: CBC: Recent Labs    08/23/21 0049 08/24/21 0105  WBC 15.2* 12.8*  HGB 8.4* 8.0*  HCT 25.3* 23.9*  PLT 722* 655*   BMET:  Recent Labs    08/23/21 0049 08/24/21 0105  NA 140 139  K 3.4* 3.3*  CL 106 106  CO2 24 25  GLUCOSE 90 87  BUN 42* 27*  CREATININE 3.49* 2.63*  CALCIUM 7.5* 7.3*    PT/INR: No results for input(s): LABPROT, INR in the last 72 hours. ABG:  INR: Will add last result for INR, ABG once components are confirmed Will add last 4 CBG results once components are confirmed  Assessment/Plan:  1. CV - SR. 2.  Pulmonary - On room air. Right pigtail chest tube removed this am. Left chest tube with 50 cc last 24 hours. Chest tube to water seal, no air leak. CXR this am shows left basilar atelectasis, small pleural effusion and minimal right basilar atelectasis. Left chest tube removed. Check CXR in am. Encourage incentive spirometer 3. ID-WBC decreased to 12,800. On Augmentin for bilateral Pneumococcal  PNA/empyema.  4. AKI in the setting of FSG, ATN-creatinine down to 2.63. Nephrology following 5. Anemia-H and H this am slightly decreased to 8 and 23.9. On Trinsicon. Per medicine 6. Remove On Early Chars ZimmermanPA-C 08/24/2021,8:32 AM [phone]   patient examined and medical record reviewed,agree with above note. PCXR reviewed this am - improved L loculated effusion Dahlia Byes 08/24/2021

## 2021-08-24 NOTE — Progress Notes (Signed)
PROGRESS NOTE    Philip Trevino  NGE:[id number] DOB: October 04, 1995 DOA: 08/14/2021 PCP: Kerin Perna, NP   Brief Narrative: 26 year old past medical history significant for focal segmental glomerulosclerosis, minimal-change disease, hypertension presents with multiple complaints.  Over the last 2 or 3 weeks he noticed loss of taste, fatigue, weakness.  Pleuritic chest pain.  Chest congestion.  Subjective fever.  Multiple episodes of diarrhea.  He was supposed to follow-up with nephrology, but due to transportation issue never follow-up.  Not taking any home medications.  Evaluation in the ED: COVID-negative, BUN 76, creatinine 6.4 albumin less than 1.5, total protein 5.9.  Chest x-ray with bibasilar chest density left greater than the right.  Finding suggestive of pleural effusion compressive atelectasis.    Patient spike fever, WBC continue to be elevated. CT chest showed BL pleural effusion/ loculated. CCM consulted for thoracentesis. CCM performed bedside US which showed bilateral loculated effusion without clear window. Recommendation was for IR chest tube placement.   Left side pleural effusion didn't improved with chest tube or lytics. CVTS consulted, he underwent Left VATS with drainage of left Empyema and decortication of the lower lobe on 1/30.  Assessment & Plan:   Principal Problem:   Acute on chronic renal failure (HCC) Active Problems:   Anemia, unspecified   Benign essential HTN   Leukocytosis   Hyperlipidemia   Pleural effusion, bilateral   Chest tube in place   Empyema [facility])   Malnutrition of moderate degree   1-AKI in the setting of FSG: Hyponatremia; in setting renal failure -Renal biopsy-proven FSGS with frequent flares but usually associated with medication noncompliance -Normal creatinine October, November, 2 weeks ago creatinine 2.2 presenting with a creatinine over 6. -AKI related to ATN on top of FSGS.  -Appreciate nephrology assistance. -Nephrology  considering further alternative agent to treat  FSG. Infection will need to be resolved/controlled.  -On  IV  lasix.  Occasional IV albumin. Renal function stable cr continues to improve at 2.6 today  Bilateral Empyema:  Patient with Leukocytosis, Fever, BL loculated pleural effusion.  -Chest x ray with atelectasis vs consolidation. -Spike fever 1/27. CT chest 1/28 showed worsening BL pleural effusion left worse than right, loculated, consider empyema.  -CCM consulted, no clear window for safe thoracentesis, concern for empyema. -IR consulted for Chest tube placement. Underwent BL chest tube placement 1/28.  -Pleural fluid consistent with empyema, awaiting triglycerides level unsure if ever done.  -Pleural fluid growing streptococcus.  -Underwent VATS of Left pleural effusion. Fail pleural fibrinolytic on the left.  -Dr Nils Pyle following.  -Pain management per CVTS.  -Ok to change zosyn to ceftriaxone per CCM. I will also Add flagyl to cover anaerobes.  -We will need 4 weeks of antibiotics total per pulm -Both chest tubes removed this morning  Hyperlipidemia:  Continue with Zetia and Lipitor  Hypertension:  on Lasix. Hold lisinopril due to AKI.   Anemia /thrombocytosis: On Aranesp.  Hb stable.   Thrombocytosis, leukocytosis; related to infection.   Hypoalbuminemia -Suspect related to hypoalbuminemia, in setting of FSG. -IV albumin given   Elevation of troponin: Likely demand in the setting of renal failure  Diarrhea:  Reported 2 episode of loose stool since yesterday Resolved.   Failure to thrive: Likely in the setting of AKI And infection.  HIV nonreactive  We will likely be to be discharged in the next 24 to 48 hours if remains stable post chest tube removal with a month of antibiotics   Estimated body mass index is  23.76 kg/m as calculated from the following:   Height as of this encounter: 5\' 10"  (1.778 m).   Weight as of this encounter: 75.1 kg.   DVT  prophylaxis: SCDs, Heparin  Code Status: Full code Family Communication: Care discussed with patient Disposition Plan:  Status is: Inpatient  Remains inpatient appropriate because: Management for acute renal failure    Consultants:  Nephrology  Procedures:  VATS left  Antimicrobials:    Subjective: Feels much better now the chest tubes are out   Objective: Vitals:   08/23/21 2303 08/24/21 0418 08/24/21 0721 08/24/21 0821  BP: (!) 137/101 (!) 147/101  (!) 149/104  Pulse:    77  Resp: 20 15  18   Temp: 98.3 F (36.8 C) 98.5 F (36.9 C)  98.6 F (37 C)  TempSrc: Oral Oral  Oral  SpO2: 95% 96% 96% 97%  Weight:      Height:        Intake/Output Summary (Last 24 hours) at 08/24/2021 1007 Last data filed at 08/24/2021 0938 Gross per 24 hour  Intake 440 ml  Output 2775 ml  Net -2335 ml   Filed Weights   08/15/21 1400 08/21/21 0652  Weight: 78.8 kg 75.1 kg    Examination:  General exam: SAlert Respiratory system: BL ronchus. BL chest tube in place.  Cardiovascular system: S 1, S 2 RRR Gastrointestinal system: BS present, soft, nt Central nervous system: Non focal.  Extremities: No edema   Data Reviewed: I have personally reviewed following labs and imaging studies  CBC: Recent Labs  Lab 08/20/21 0051 08/21/21 0327 08/21/21 0328 08/22/21 0106 08/23/21 0049 08/24/21 0105  WBC 13.3*  --  20.2* 16.3* 15.2* 12.8*  NEUTROABS  --   --   --   --  10.3* 7.9*  HGB 8.3* 8.8* 9.2* 8.1* 8.4* 8.0*  HCT 25.8* 26.0* 27.7* 25.2* 25.3* 23.9*  MCV 87.8  --  87.7 88.1 88.2 88.5  PLT 860*  --  826* 746* 722* 182*   Basic Metabolic Panel: Recent Labs  Lab 08/19/21 0121 08/20/21 0051 08/21/21 0327 08/21/21 0328 08/22/21 0106 08/23/21 0049 08/24/21 0105  NA 134* 137 139 137 140 140 139  K 4.8 4.6 5.0 4.7 3.9 3.4* 3.3*  CL 104 107  --  106 107 106 106  CO2 20* 22  --  21* 22 24 25   GLUCOSE 114* 110*  --  127* 103* 90 87  BUN 70* 62*  --  58* 57* 42* 27*   CREATININE 5.79* 5.37*  --  5.09* 4.79* 3.49* 2.63*  CALCIUM 7.0* 7.4*  --  7.4* 7.4* 7.5* 7.3*  PHOS 9.0* 9.4*  --  9.4*  --  5.7* 4.8*   GFR: Estimated Creatinine Clearance: 44.7 mL/min (A) (by C-G formula based on SCr of 2.63 mg/dL (H)). Liver Function Tests: Recent Labs  Lab 08/20/21 0051 08/21/21 0328 08/22/21 0106 08/23/21 0049 08/24/21 0105  AST  --   --  17  --   --   ALT  --   --  8  --   --   ALKPHOS  --   --  49  --   --   BILITOT  --   --  <0.1*  --   --   PROT  --   --  5.9*  --   --   ALBUMIN <1.5* <1.5* 1.6* 2.1* 1.9*   No results for input(s): LIPASE, AMYLASE in the last 168 hours. No results for input(s): AMMONIA  in the last 168 hours. Coagulation Profile: Recent Labs  Lab 08/18/21 1639  INR 1.3*   Cardiac Enzymes: No results for input(s): CKTOTAL, CKMB, CKMBINDEX, TROPONINI in the last 168 hours. BNP (last 3 results) No results for input(s): PROBNP in the last 8760 hours. HbA1C: No results for input(s): HGBA1C in the last 72 hours. CBG: No results for input(s): GLUCAP in the last 168 hours. Lipid Profile: No results for input(s): CHOL, HDL, LDLCALC, TRIG, CHOLHDL, LDLDIRECT in the last 72 hours.  Thyroid Function Tests: No results for input(s): TSH, T4TOTAL, FREET4, T3FREE, THYROIDAB in the last 72 hours. Anemia Panel: No results for input(s): VITAMINB12, FOLATE, FERRITIN, TIBC, IRON, RETICCTPCT in the last 72 hours. Sepsis Labs: No results for input(s): PROCALCITON, LATICACIDVEN in the last 168 hours.  Recent Results (from the past 240 hour(s))  Resp Panel by RT-PCR (Flu A&B, Covid) Nasopharyngeal Swab     Status: None   Collection Time: 08/14/21  4:26 PM   Specimen: Nasopharyngeal Swab; Nasopharyngeal(NP) swabs in vial transport medium  Result Value Ref Range Status   SARS Coronavirus 2 by RT PCR NEGATIVE NEGATIVE Final    Comment: (NOTE) SARS-CoV-2 target nucleic acids are NOT DETECTED.  The SARS-CoV-2 RNA is generally detectable in upper  respiratory specimens during the acute phase of infection. The lowest concentration of SARS-CoV-2 viral copies this assay can detect is 138 copies/mL. A negative result does not preclude SARS-Cov-2 infection and should not be used as the sole basis for treatment or other patient management decisions. A negative result may occur with  improper specimen collection/handling, submission of specimen other than nasopharyngeal swab, presence of viral mutation(s) within the areas targeted by this assay, and inadequate number of viral copies(<138 copies/mL). A negative result must be combined with clinical observations, patient history, and epidemiological information. The expected result is Negative.  Fact Sheet for Patients:  EntrepreneurPulse.com.au  Fact Sheet for Healthcare Providers:  IncredibleEmployment.be  This test is no t yet approved or cleared by the [place] FDA and  has been authorized for detection and/or diagnosis of SARS-CoV-2 by FDA under an Emergency Use Authorization (EUA). This EUA will remain  in effect (meaning this test can be used) for the duration of the COVID-19 declaration under Section 564(b)(1) of the Act, 21 U.S.C.section 360bbb-3(b)(1), unless the authorization is terminated  or revoked sooner.       Influenza A by PCR NEGATIVE NEGATIVE Final   Influenza B by PCR NEGATIVE NEGATIVE Final    Comment: (NOTE) The Xpert Xpress SARS-CoV-2/FLU/RSV plus assay is intended as an aid in the diagnosis of influenza from Nasopharyngeal swab specimens and should not be used as a sole basis for treatment. Nasal washings and aspirates are unacceptable for Xpert Xpress SARS-CoV-2/FLU/RSV testing.  Fact Sheet for Patients: EntrepreneurPulse.com.au  Fact Sheet for Healthcare Providers: IncredibleEmployment.be  This test is not yet approved or cleared by the [place] FDA and has been  authorized for detection and/or diagnosis of SARS-CoV-2 by FDA under an Emergency Use Authorization (EUA). This EUA will remain in effect (meaning this test can be used) for the duration of the COVID-19 declaration under Section 564(b)(1) of the Act, 21 U.S.C. section 360bbb-3(b)(1), unless the authorization is terminated or revoked.  Performed at [facility], [place] [address], [place], [place] [address]   Culture, blood (routine x 2)     Status: None   Collection Time: 08/14/21  9:32 PM   Specimen: BLOOD RIGHT HAND  Result Value Ref Range  Status   Specimen Description BLOOD RIGHT HAND  Final   Special Requests   Final    BOTTLES DRAWN AEROBIC AND ANAEROBIC Blood Culture results may not be optimal due to an inadequate volume of blood received in culture bottles   Culture   Final    NO GROWTH 5 DAYS Performed at [facility], [place] [address], [place], [place] [address]    Report Status 08/19/2021 FINAL  Final  Culture, blood (routine x 2)     Status: None   Collection Time: 08/14/21  9:33 PM   Specimen: BLOOD  Result Value Ref Range Status   Specimen Description BLOOD LEFT ANTECUBITAL  Final   Special Requests   Final    BOTTLES DRAWN AEROBIC AND ANAEROBIC Blood Culture adequate volume   Culture   Final    NO GROWTH 5 DAYS Performed at [facility], [place] [address], [place], [place] [address]    Report Status 08/19/2021 FINAL  Final  Urine Culture     Status: Abnormal   Collection Time: 08/16/21  9:38 AM   Specimen: Urine, Clean Catch  Result Value Ref Range Status   Specimen Description URINE, CLEAN CATCH  Final   Special Requests NONE  Final   Culture (A)  Final    <10,000 COLONIES/mL INSIGNIFICANT GROWTH Performed at [facility], [place] [address], [place], [place] [address]    Report Status 08/17/2021 FINAL  Final  Culture, blood (routine x 2)     Status: None   Collection Time: 08/18/21  9:13 AM   Specimen: BLOOD  Result Value Ref Range Status    Specimen Description BLOOD LEFT ANTECUBITAL  Final   Special Requests   Final    BOTTLES DRAWN AEROBIC AND ANAEROBIC Blood Culture adequate volume   Culture   Final    NO GROWTH 5 DAYS Performed at [facility], [place] [address], [place], [place] [address]    Report Status 08/23/2021 FINAL  Final  Culture, blood (routine x 2)     Status: None   Collection Time: 08/18/21  9:25 AM   Specimen: BLOOD LEFT HAND  Result Value Ref Range Status   Specimen Description BLOOD LEFT HAND  Final   Special Requests   Final    BOTTLES DRAWN AEROBIC AND ANAEROBIC Blood Culture adequate volume   Culture   Final    NO GROWTH 5 DAYS Performed at [facility], [place] [address], [place],  [address]    Report Status 08/23/2021 FINAL  Final  Aerobic/Anaerobic Culture w Gram Stain (surgical/deep wound)     Status: None   Collection Time: 08/18/21  2:57 PM   Specimen: Pleural Fluid  Result Value Ref Range Status   Specimen Description PLEURAL FLUID  Final   Special Requests RIGHT CHEST  Final   Gram Stain   Final    ABUNDANT WBC PRESENT,BOTH PMN AND MONONUCLEAR NO ORGANISMS SEEN    Culture   Final    RARE STREPTOCOCCUS PNEUMONIAE SUSCEPTIBILITIES PERFORMED ON PREVIOUS CULTURE WITHIN THE LAST 5 DAYS. NO ANAEROBES ISOLATED Performed at [facility], [place] [address] [place],  [address]    Report Status 08/23/2021 FINAL  Final  Aerobic/Anaerobic Culture w Gram Stain (surgical/deep wound)     Status: None   Collection Time: 08/18/21  2:58 PM   Specimen: Pleural Fluid  Result Value Ref Range Status   Specimen Description PLEURAL FLUID  Final   Special  Requests LEFT CHEST  Final   Gram Stain   Final    ABUNDANT WBC PRESENT,BOTH PMN AND MONONUCLEAR NO ORGANISMS SEEN    Culture   Final    FEW STREPTOCOCCUS PNEUMONIAE CRITICAL RESULT CALLED TO, READ BACK BY AND VERIFIED WITH: RN K.SICKLES ON [id number] AT 1120 BY E.PARRISH NO ANAEROBES ISOLATED Performed at [facility], [place] [address] [place], [place] [address]    Report Status 08/23/2021 FINAL  Final   Organism ID, Bacteria STREPTOCOCCUS PNEUMONIAE  Final      Susceptibility   Streptococcus pneumoniae - MIC*    ERYTHROMYCIN >=8 RESISTANT Resistant     LEVOFLOXACIN 0.5 SENSITIVE Sensitive     VANCOMYCIN <=0.12 SENSITIVE Sensitive     PENICILLIN (meningitis) 0.25 RESISTANT Resistant     PENO - penicillin 0.25      PENICILLIN (non-meningitis) 0.25 SENSITIVE Sensitive     PENICILLIN (oral) 0.25 INTERMEDIATE Intermediate     CEFTRIAXONE (non-meningitis) <=0.12 SENSITIVE Sensitive     CEFTRIAXONE (meningitis) <=0.12 SENSITIVE Sensitive     * FEW STREPTOCOCCUS PNEUMONIAE  Surgical pcr screen     Status: None   Collection Time: 08/20/21  7:56 AM   Specimen: Nasal Mucosa; Nasal Swab  Result Value Ref Range Status   MRSA, PCR NEGATIVE NEGATIVE Final   Staphylococcus aureus NEGATIVE NEGATIVE Final    Comment: (NOTE) The Xpert SA Assay (FDA approved for NASAL specimens in patients 52 years of age and older), is one component of a comprehensive surveillance program. It is not intended to diagnose infection nor to guide or monitor treatment. Performed at [facility], [place] [address], [place], [place] [address]   Body fluid culture w Gram Stain     Status: None   Collection Time: 08/20/21 10:58 AM   Specimen: PATH Other; Body Fluid  Result Value Ref Range Status   Specimen Description PLEURAL FLUID  Final   Special Requests NONE  Final   Gram Stain   Final    ABUNDANT WBC PRESENT, PREDOMINANTLY PMN NO ORGANISMS SEEN    Culture   Final    NO GROWTH 3 DAYS Performed at [facility], [address] [place], [place] [address]    Report Status 08/23/2021 FINAL  Final  Aerobic/Anaerobic Culture w Gram Stain (surgical/deep wound)     Status: None (Preliminary result)   Collection Time: 08/20/21 11:02 AM   Specimen: PATH Other; Body Fluid  Result Value Ref Range Status   Specimen  Description PLEURAL  Final   Special Requests NONE  Final   Gram Stain   Final    ABUNDANT WBC PRESENT, PREDOMINANTLY PMN NO ORGANISMS SEEN    Culture   Final    NO GROWTH 3 DAYS NO ANAEROBES ISOLATED; CULTURE IN PROGRESS FOR 5 DAYS Performed at [facility], [address], [place], [place] [address]    Report Status PENDING  Incomplete          Radiology Studies: DG Chest Port 1 View  Result Date: 08/24/2021 CLINICAL DATA:  Bilateral empyema. EXAM: PORTABLE CHEST 1 VIEW COMPARISON:  August 23, 2021. FINDINGS: Stable cardiomediastinal silhouette. Stable bilateral chest tubes without definite pneumothorax. Stable left basilar atelectasis or pneumonia is noted with probable small left pleural effusion. Minimal right basilar subsegmental atelectasis is noted. Bony thorax is unremarkable. IMPRESSION: Stable bilateral chest tubes. Stable left basilar atelectasis or pneumonia with probable small left pleural effusion. Minimal right basilar subsegmental atelectasis. Electronically Signed  By: Marijo Conception M.D.   On: 08/24/2021 08:13   DG Chest Port 1 View  Result Date: 08/23/2021 CLINICAL DATA:  Chest pain. EXAM: PORTABLE CHEST 1 VIEW COMPARISON:  08/20/2021 FINDINGS: Trace right pleural effusion. Right-sided chest tube in unchanged position. Small left pleural effusion. Left-sided chest tube in satisfactory position. Left lower lobe airspace disease concerning for pneumonia. Hazy right lower lobe airspace disease may reflect atelectasis versus pneumonia. No pneumothorax. Stable cardiomediastinal silhouette. No aggressive osseous lesion. IMPRESSION: 1. Bilateral chest tubes in satisfactory position. 2. Trace right pleural effusion. Right lower lobe airspace disease which may reflect atelectasis versus pneumonia. 3. Small left pleural effusion. Left lower lobe airspace disease concerning for pneumonia. Electronically Signed   By: Kathreen Devoid M.D.   On: 08/23/2021 07:21         Scheduled Meds:  acetaminophen  1,000 mg Oral Q6H   Or   acetaminophen (TYLENOL) oral liquid 160 mg/5 mL  1,000 mg Oral Q6H   amoxicillin-clavulanate  1 tablet Oral Q12H   atorvastatin  80 mg Oral Daily   darbepoetin (ARANESP) injection - NON-DIALYSIS  100 mcg Subcutaneous Q Thu-1800   ezetimibe  10 mg Oral Daily   feeding supplement  1 Container Oral TID BM   ferrous [id number] C-folic acid  1 capsule Oral TID PC   furosemide  80 mg Oral BID   pantoprazole  40 mg Oral Daily   senna-docusate  1 tablet Oral QHS   Continuous Infusions:     LOS: 10 days      Dona Klemann A, MD [facility]   08/24/2021, 10:07 AM

## 2021-08-24 NOTE — Discharge Instructions (Addendum)
ACTIVITY:  1.Increase activity slowly. 2.Walk daily and increase frequency and duration as tolerates. 3.May walk up steps. 4.No lifting more than ten pounds for two weeks. 5.No driving for two weeks. 6.Avoid straining. 7.STOP any activity that causes chest pain, shortness of breath, dizziness,sweating,     or excessive weakness. 8.Continue with breathing exercises daily.   WOUND:  1.May shower. 2.Clean wounds with mild soap and water.  Call the office at [phone] if any problems arise.       

## 2021-08-24 NOTE — Procedures (Signed)
Right chest tube removed w/out difficulty Occlusive dressing placed.   Erick Colace ACNP-BC [place] Pager # [phone] OR # [phone] if no answer

## 2021-08-24 NOTE — Plan of Care (Signed)

## 2021-08-24 NOTE — Plan of Care (Signed)
Problem: Education: Goal: Knowledge of General Education information will improve Description: Including pain rating scale, medication(s)/side effects and non-pharmacologic comfort measures 08/24/2021 2127 by Burt Knack, Sheala Dosh D, RN Outcome: Progressing 08/24/2021 2127 by Burt Knack, Johari Bennetts D, RN Outcome: Progressing 08/24/2021 2127 by Burt Knack, Jaydian Santana D, RN Outcome: Progressing 08/24/2021 2126 by Burt Knack, Emmajo Bennette D, RN Outcome: Progressing   Problem: Health Behavior/Discharge Planning: Goal: Ability to manage health-related needs will improve 08/24/2021 2127 by Burt Knack, Demetrius Barrell D, RN Outcome: Progressing 08/24/2021 2127 by Burt Knack, Geovanna Simko D, RN Outcome: Progressing 08/24/2021 2127 by Burt Knack, Council Munguia D, RN Outcome: Progressing 08/24/2021 2126 by Burt Knack, Endre Coutts D, RN Outcome: Progressing   Problem: Clinical Measurements: Goal: Ability to maintain clinical measurements within normal limits will improve 08/24/2021 2127 by Burt Knack, Raheem Kolbe D, RN Outcome: Progressing 08/24/2021 2127 by Burt Knack, Glennice Marcos D, RN Outcome: Progressing 08/24/2021 2127 by Burt Knack, Mercie Balsley D, RN Outcome: Progressing 08/24/2021 2126 by Burt Knack, Drew Lips D, RN Outcome: Progressing Goal: Will remain free from infection 08/24/2021 2127 by Burt Knack, Truda Staub D, RN Outcome: Progressing 08/24/2021 2127 by Burt Knack, Sondra Blixt D, RN Outcome: Progressing 08/24/2021 2127 by Burt Knack, Shayna Eblen D, RN Outcome: Progressing 08/24/2021 2126 by Burt Knack, Rollan Roger D, RN Outcome: Progressing Goal: Diagnostic test results will improve 08/24/2021 2127 by Burt Knack, Jamieson Hetland D, RN Outcome: Progressing 08/24/2021 2127 by Burt Knack, Kina Shiffman D, RN Outcome: Progressing 08/24/2021 2127 by Burt Knack, Faria Casella D, RN Outcome: Progressing 08/24/2021 2126 by Burt Knack, Alethea Terhaar D, RN Outcome: Progressing Goal: Respiratory complications will improve 08/24/2021 2127 by Burt Knack, Arthurine Oleary D, RN Outcome: Progressing 08/24/2021 2127 by Burt Knack, Vennie Salsbury D, RN Outcome: Progressing 08/24/2021 2127 by Burt Knack, Gemma Ruan D, RN Outcome: Progressing 08/24/2021 2126 by  Burt Knack, Eugen Jeansonne D, RN Outcome: Progressing Goal: Cardiovascular complication will be avoided 08/24/2021 2127 by Heriberto Antigua D, RN Outcome: Progressing 08/24/2021 2127 by Burt Knack, Darrian Grzelak D, RN Outcome: Progressing 08/24/2021 2127 by Burt Knack, Andi Layfield D, RN Outcome: Progressing 08/24/2021 2126 by Burt Knack, Dontreal Miera D, RN Outcome: Progressing   Problem: Activity: Goal: Risk for activity intolerance will decrease 08/24/2021 2127 by Heriberto Antigua D, RN Outcome: Progressing 08/24/2021 2127 by Burt Knack, Esme Durkin D, RN Outcome: Progressing 08/24/2021 2127 by Burt Knack, Khamila Bassinger D, RN Outcome: Progressing 08/24/2021 2126 by Burt Knack, Ashdon Gillson D, RN Outcome: Progressing   Problem: Nutrition: Goal: Adequate nutrition will be maintained 08/24/2021 2127 by Burt Knack, Jeremy Mclamb D, RN Outcome: Progressing 08/24/2021 2127 by Burt Knack, Armonii Sieh D, RN Outcome: Progressing 08/24/2021 2127 by Burt Knack, Shardee Dieu D, RN Outcome: Progressing 08/24/2021 2126 by Burt Knack, Mizani Dilday D, RN Outcome: Progressing   Problem: Coping: Goal: Level of anxiety will decrease 08/24/2021 2127 by Burt Knack, Kymari Nuon D, RN Outcome: Progressing 08/24/2021 2127 by Burt Knack, Lisa-Marie Rueger D, RN Outcome: Progressing 08/24/2021 2127 by Burt Knack, Tessie Ordaz D, RN Outcome: Progressing 08/24/2021 2126 by Burt Knack, Naoki Migliaccio D, RN Outcome: Progressing   Problem: Elimination: Goal: Will not experience complications related to bowel motility 08/24/2021 2127 by Burt Knack, Renalda Locklin D, RN Outcome: Progressing 08/24/2021 2127 by Burt Knack, Vivyan Biggers D, RN Outcome: Progressing 08/24/2021 2127 by Burt Knack, Lanetta Figuero D, RN Outcome: Progressing 08/24/2021 2126 by Burt Knack, Issacc Merlo D, RN Outcome: Progressing Goal: Will not experience complications related to urinary retention 08/24/2021 2127 by Burt Knack, Knox Holdman D, RN Outcome: Progressing 08/24/2021 2127 by Burt Knack, Alishia Lebo D, RN Outcome: Progressing 08/24/2021 2127 by Burt Knack, Mehul Rudin D, RN Outcome: Progressing 08/24/2021 2126 by Burt Knack, Karsynn Deweese D, RN Outcome: Progressing   Problem: Pain Managment: Goal: General  experience of comfort will improve 08/24/2021 2127 by Heriberto Antigua D, RN Outcome: Progressing 08/24/2021 2127 by Burt Knack, Thorsten Climer D, RN Outcome: Progressing 08/24/2021 2127 by Burt Knack, Ledora Delker D, RN Outcome: Progressing  08/24/2021 2126 by Burt Knack, Dontez Hauss D, RN Outcome: Progressing   Problem: Safety: Goal: Ability to remain free from injury will improve 08/24/2021 2127 by Burt Knack, Azyria Osmon D, RN Outcome: Progressing 08/24/2021 2127 by Burt Knack, Jamesmichael Shadd D, RN Outcome: Progressing 08/24/2021 2127 by Burt Knack, Jordis Repetto D, RN Outcome: Progressing 08/24/2021 2126 by Burt Knack, Quanda Pavlicek D, RN Outcome: Progressing   Problem: Skin Integrity: Goal: Risk for impaired skin integrity will decrease 08/24/2021 2127 by Burt Knack, Izaiha Lo D, RN Outcome: Progressing 08/24/2021 2127 by Burt Knack, Fynn Adel D, RN Outcome: Progressing 08/24/2021 2127 by Burt Knack, Luccas Towell D, RN Outcome: Progressing 08/24/2021 2126 by Burt Knack, Isaish Alemu D, RN Outcome: Progressing   Problem: Education: Goal: Knowledge of disease and its progression will improve 08/24/2021 2127 by Burt Knack, Frederik Standley D, RN Outcome: Progressing 08/24/2021 2127 by Burt Knack, Camber Ninh D, RN Outcome: Progressing 08/24/2021 2127 by Burt Knack, Lillia Lengel D, RN Outcome: Progressing 08/24/2021 2126 by Burt Knack, Isatou Agredano D, RN Outcome: Progressing   Problem: Health Behavior/Discharge Planning: Goal: Ability to manage health-related needs will improve 08/24/2021 2127 by Burt Knack, Kaelee Pfeffer D, RN Outcome: Progressing 08/24/2021 2127 by Burt Knack, Janiesha Diehl D, RN Outcome: Progressing 08/24/2021 2127 by Burt Knack, Laquana Villari D, RN Outcome: Progressing 08/24/2021 2126 by Burt Knack, Lasandra Batley D, RN Outcome: Progressing   Problem: Clinical Measurements: Goal: Complications related to the disease process or treatment will be avoided or minimized 08/24/2021 2127 by Burt Knack, Metztli Sachdev D, RN Outcome: Progressing 08/24/2021 2127 by Burt Knack, Bader Stubblefield D, RN Outcome: Progressing 08/24/2021 2127 by Burt Knack, Tarence Searcy D, RN Outcome: Progressing 08/24/2021 2126 by Burt Knack, Rahshawn Remo D, RN Outcome:  Progressing Goal: Dialysis access will remain free of complications 09/21/4399 0272 by Heriberto Antigua D, RN Outcome: Progressing 08/24/2021 2127 by Burt Knack, Josian Lanese D, RN Outcome: Progressing 08/24/2021 2127 by Burt Knack, Tabetha Haraway D, RN Outcome: Progressing 08/24/2021 2126 by Burt Knack, Corvin Sorbo D, RN Outcome: Progressing   Problem: Activity: Goal: Activity intolerance will improve 08/24/2021 2127 by Heriberto Antigua D, RN Outcome: Progressing 08/24/2021 2127 by Burt Knack, Ophia Shamoon D, RN Outcome: Progressing 08/24/2021 2127 by Burt Knack, Lakelynn Severtson D, RN Outcome: Progressing 08/24/2021 2126 by Burt Knack, Brenly Trawick D, RN Outcome: Progressing   Problem: Fluid Volume: Goal: Fluid volume balance will be maintained or improved 08/24/2021 2127 by Burt Knack, Icelyn Navarrete D, RN Outcome: Progressing 08/24/2021 2127 by Burt Knack, Dantonio Justen D, RN Outcome: Progressing 08/24/2021 2127 by Burt Knack, Loreley Schwall D, RN Outcome: Progressing 08/24/2021 2126 by Burt Knack, Jahkai Yandell D, RN Outcome: Progressing   Problem: Nutritional: Goal: Ability to make appropriate dietary choices will improve 08/24/2021 2127 by Burt Knack, Lizbett Garciagarcia D, RN Outcome: Progressing 08/24/2021 2127 by Burt Knack, Temitope Griffing D, RN Outcome: Progressing 08/24/2021 2127 by Burt Knack, Timoty Bourke D, RN Outcome: Progressing 08/24/2021 2126 by Burt Knack, Shaina Gullatt D, RN Outcome: Progressing   Problem: Respiratory: Goal: Respiratory symptoms related to disease process will be avoided 08/24/2021 2127 by Burt Knack, Keshaun Dubey D, RN Outcome: Progressing 08/24/2021 2127 by Burt Knack, Sirena Riddle D, RN Outcome: Progressing 08/24/2021 2127 by Burt Knack, Verdun Rackley D, RN Outcome: Progressing 08/24/2021 2126 by Burt Knack, Marquist Binstock D, RN Outcome: Progressing   Problem: Self-Concept: Goal: Body image disturbance will be avoided or minimized 08/24/2021 2127 by Burt Knack, Claretha Townshend D, RN Outcome: Progressing 08/24/2021 2127 by Burt Knack, Kamrie Fanton D, RN Outcome: Progressing 08/24/2021 2127 by Burt Knack, Loveda Colaizzi D, RN Outcome: Progressing 08/24/2021 2126 by Burt Knack, Grayland Daisey D, RN Outcome: Progressing   Problem:  Urinary Elimination: Goal: Progression of disease will be identified and treated 08/24/2021 2127 by Heriberto Antigua D, RN Outcome: Progressing 08/24/2021 2127 by Burt Knack, Jacqualynn Parco D, RN Outcome: Progressing 08/24/2021 2127 by Burt Knack, Mehlani Blankenburg D, RN Outcome: Progressing 08/24/2021 2126 by Burt Knack, Tiarra Anastacio D, RN Outcome: Progressing

## 2021-08-24 NOTE — Progress Notes (Signed)
Right chest tube removed by  critical care PA tolerated well. Continue to monitor.

## 2021-08-24 NOTE — Progress Notes (Addendum)
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  He reports a cough minimally productive of clear sputum and bilateral chest pain when he would cough or take deep breaths.  Reports preceding URI symptoms and tested negative for COVID and flu x2, no sick contacts.  He tried marijuana to increase his appetite.  Initial labs showed BUN/creatinine of 76/6.4, baseline noted to be 2.2 about 3 weeks ago.  WBC count was 20 5K with hemoglobin of 8.7 Chest x-ray showed bibasal effusions  He was initially given IV fluids then developed fever, urine culture showed less than 10K organisms, started ceftriaxone and azithromycin to cover UTI and pneumonia.  HIV negative.  CT chest without contrast 1/28 showed large left and moderate right, partially loculated pleural effusions.  Mass effect on underlying lung with passive atelectasis Hence PCCM consulted  He states that he has not seen a nephrologist over the past year since he was "bouncing around"  Significant tests/ events reviewed 1/28 CT-guided bilateral chest tubes 1/30 left VATs 2/3 right chest tube removed  SUBJECTIVE:  Feeling remarkably better   VITAL SIGNS: Temp:  [98.3 F (36.8 C)-98.6 F (37 C)] 98.5 F (36.9 C) (02/03 0418) Resp:  [15-20] 15 (02/03 0418) BP: (136-147)/(88-101) 147/101 (02/03 0418) SpO2:  [95 %-96 %] 96 % (02/03 0721) Room Air  PHYSICAL EXAMINATION:  General: 26 year old male patient resting in bed he is in no acute distress today, he looks 100% better than he did 48 hours ago.  His overall demeanor is improved, his pain is tolerated.  He is excited  about getting home soon HEENT: Normocephalic atraumatic no jugular venous distention appreciated Pulmonary: Clear to auscultation no accessory use currently room air.  The right chest tube has had no drainage over the last 24 hours, the left chest tube has placed 50 cc of drainage Portable chest x-ray personally reviewed from this morning: Chest tubes in adequate position.  Improved aeration continues on the right.  There is some trace basilar volume loss on the left, really unchanged from prior exam Cardiac regular rate and rhythm Abdomen soft not tender Extremities warm and dry Neuro intact  Resolved problem list   Acute respiratory failure with hypoxia  ASSESSMENT / PLAN:  Acute bilateral lower lobe pneumonia Bilateral empyema-- pneumococcus. 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    Pulm problem list  Bilateral Pneumococcal PNA w/ bilateral Empyema. S/p left VAT 1/30 and right chest tube placement w/ 1 round of Lytics on right.   No output from right. W/ CXR clear on right I removed the right chest tube this am  Left 70ml  Plan/rec Keep right chest tube dressing in place x 48 hrs then can use regular band-aid till gone  Abx day 10; now on augmentin needs 6 weeks  Will place f/u with our clinic in chart around 5-6 weeks w/ plan for re-imaging  Mobilize  Defer left chest tube to thoracic surg   He has f/u w/ PCP already set up by CM (they will call him)   We will s/o  Erick Colace ACNP-BC [place] Pager # [phone] OR # [phone] if no answer   Seen and examined. Feels better with tubes out. Breath sounds remain diminished on L Incomplete re-expansion on left noted on CXR but still pretty good aeration. Agree with prolonged abx (6 weeks) and PCP f/u with referral to pulm vs. TCTS should CXR fail to clear. Available PRN  Erskine Emery  MD PCCM

## 2021-08-24 NOTE — Progress Notes (Signed)
Subjective:   Patient having some left-sided chest pain today but otherwise no significant issues.  Urine output has been excellent.  Creatinine improved significantly in the past 24 hours  Objective Vital signs in last 24 hours: Vitals:   08/23/21 2303 08/24/21 0418 08/24/21 0721 08/24/21 0821  BP: (!) 137/101 (!) 147/101  (!) 149/104  Pulse:    77  Resp: 20 15  18   Temp: 98.3 F (36.8 C) 98.5 F (36.9 C)  98.6 F (37 C)  TempSrc: Oral Oral  Oral  SpO2: 95% 96% 96% 97%  Weight:      Height:       Weight change:   Intake/Output Summary (Last 24 hours) at 08/24/2021 1011 Last data filed at 08/24/2021 2330 Gross per 24 hour  Intake 440 ml  Output 2775 ml  Net -2335 ml     Assessment/Plan:25 ear old BM with history of FSG-  course complicated by frequent flaring and medication non compliance-  now with A on CRF and likely disease flaring  1.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  Creatinine was normal and in November 2022.  15 days prior to admission creatinine was around 2.2.  Felt that his acute kidney injury here with creatinine up to 6 was more likely ATN on top of his FSGS given the background of infection.  Crt has been greatly improving approaching his baseline  It is likely patient is going to need alternative immunotherapy after everything is said and done.  Not excited about a CNI right now given his active AKI.  Rituximab could be considered but would want to adequately treat his significant infection first.  Trying to avoid excessive steroids. -Continue to monitor creatinine daily -No further albumin needed -Continue treatment of infection -Consider further immunosuppression; likely to take place outpatient -arranging follow up with Dr. Hollie Salk in the next 2 weeks -holding steroids at discharge   2. Hypertension/volume  -significant edema but overall improved.  Status post IV albumin.  Switch IV Lasix to  oral Lasix 80 mg twice daily  3.  Appetite sxms-has improved with treatment of infection.  Unlikely uremia 4. Anemia  - indicates long term CKD vs GIB possibly due to long term steroids -  have put him on protonix -  and give dose of ESA - hgb around 8-9 5.  Elevated WBC/bilateral empyema-  fever. Leukopenia and pyuria-initially treated for UTI now with concern of bilateral empyema.  Appreciate primary and consultant management.  Status post thoracotomy and bilateral chest tubes.  Continue antibiotics per primary team; plan for a total of 6 weeks  Dispo: From a nephrology perspective the patient is stable for discharge with close follow-up in the outpatient setting.  I have arranged follow-up with Dr. Hollie Salk in the next 1 to 2 weeks.  Given his improving kidney function I will sign off at this time.  Please do not hesitate to contact us if the patient needs further assistance.     Reesa Chew    Labs: Basic Metabolic Panel: Recent Labs  Lab 08/21/21 0328 08/22/21 0106 08/23/21 0049 08/24/21 0105  NA 137 140 140 139  K 4.7 3.9 3.4* 3.3*  CL 106 107 106 106  CO2 21* 22 24 25   GLUCOSE 127* 103* 90 87  BUN 58* 57* 42* 27*  CREATININE 5.09* 4.79* 3.49* 2.63*  CALCIUM 7.4* 7.4* 7.5* 7.3*  PHOS 9.4*  --  5.7* 4.8*   Liver Function Tests: Recent Labs  Lab 08/22/21 0106 08/23/21 0049 08/24/21 0105  AST 17  --   --   ALT 8  --   --   ALKPHOS 49  --   --   BILITOT <0.1*  --   --   PROT 5.9*  --   --   ALBUMIN 1.6* 2.1* 1.9*   No results for input(s): LIPASE, AMYLASE in the last 168 hours.  No results for input(s): AMMONIA in the last 168 hours. CBC: Recent Labs  Lab 08/20/21 0051 08/21/21 0327 08/21/21 0328 08/22/21 0106 08/23/21 0049 08/24/21 0105  WBC 13.3*  --  20.2* 16.3* 15.2* 12.8*  NEUTROABS  --   --   --   --  10.3* 7.9*  HGB 8.3*   < > 9.2* 8.1* 8.4* 8.0*  HCT 25.8*   < > 27.7* 25.2* 25.3* 23.9*  MCV 87.8  --  87.7 88.1 88.2 88.5  PLT 860*  --  826* 746*  722* 655*   < > = values in this interval not displayed.   Cardiac Enzymes: No results for input(s): CKTOTAL, CKMB, CKMBINDEX, TROPONINI in the last 168 hours. CBG: No results for input(s): GLUCAP in the last 168 hours.  Iron Studies: No results for input(s): IRON, TIBC, TRANSFERRIN, FERRITIN in the last 72 hours. Studies/Results: DG Chest Port 1 View  Result Date: 08/24/2021 CLINICAL DATA:  Bilateral empyema. EXAM: PORTABLE CHEST 1 VIEW COMPARISON:  August 23, 2021. FINDINGS: Stable cardiomediastinal silhouette. Stable bilateral chest tubes without definite pneumothorax. Stable left basilar atelectasis or pneumonia is noted with probable small left pleural effusion. Minimal right basilar subsegmental atelectasis is noted. Bony thorax is unremarkable. IMPRESSION: Stable bilateral chest tubes. Stable left basilar atelectasis or pneumonia with probable small left pleural effusion. Minimal right basilar subsegmental atelectasis. Electronically Signed   By: Marijo Conception M.D.   On: 08/24/2021 08:13   DG Chest Port 1 View  Result Date: 08/23/2021 CLINICAL DATA:  Chest pain. EXAM: PORTABLE CHEST 1 VIEW COMPARISON:  08/20/2021 FINDINGS: Trace right pleural effusion. Right-sided chest tube in unchanged position. Small left pleural effusion. Left-sided chest tube in satisfactory position. Left lower lobe airspace disease concerning for pneumonia. Hazy right lower lobe airspace disease may reflect atelectasis versus pneumonia. No pneumothorax. Stable cardiomediastinal silhouette. No aggressive osseous lesion. IMPRESSION: 1. Bilateral chest tubes in satisfactory position. 2. Trace right pleural effusion. Right lower lobe airspace disease which may reflect atelectasis versus pneumonia. 3. Small left pleural effusion. Left lower lobe airspace disease concerning for pneumonia. Electronically Signed   By: Kathreen Devoid M.D.   On: 08/23/2021 07:21   Medications: Infusions:     Scheduled Medications:   acetaminophen  1,000 mg Oral Q6H   Or   acetaminophen (TYLENOL) oral liquid 160 mg/5 mL  1,000 mg Oral Q6H   amoxicillin-clavulanate  1 tablet Oral Q12H   atorvastatin  80 mg Oral Daily   darbepoetin (ARANESP) injection - NON-DIALYSIS  100 mcg Subcutaneous Q Thu-1800   ezetimibe  10 mg Oral Daily   feeding supplement  1 Container Oral TID BM   ferrous [id number] C-folic acid  1 capsule Oral TID PC   furosemide  80 mg Oral BID   pantoprazole  40 mg Oral Daily   senna-docusate  1 tablet Oral QHS    have reviewed scheduled and prn medications.  Physical Exam: General: Sitting in chair, no distress Heart: Tachycardia, regular rhythm Lungs: Bilateral chest rise, no iwob Abdomen: soft, non tender Extremities: 1+ pitting edema in the  bilateral lower extremities (overall improved), warm and well perfused    08/24/2021,10:11 AM  LOS: 10 days

## 2021-08-25 ENCOUNTER — Inpatient Hospital Stay (HOSPITAL_COMMUNITY): Payer: Medicaid Other

## 2021-08-25 LAB — RENAL FUNCTION PANEL
Albumin: 1.7 g/dL — ABNORMAL LOW (ref 3.5–5.0)
Anion gap: 7 (ref 5–15)
BUN: 17 mg/dL (ref 6–20)
CO2: 24 mmol/L (ref 22–32)
Calcium: 7.4 mg/dL — ABNORMAL LOW (ref 8.9–10.3)
Chloride: 108 mmol/L (ref 98–111)
Creatinine, Ser: 1.85 mg/dL — ABNORMAL HIGH (ref 0.61–1.24)
GFR, Estimated: 52 mL/min — ABNORMAL LOW (ref >60)
Glucose, Bld: 130 mg/dL — ABNORMAL HIGH (ref 70–99)
Phosphorus: 3.2 mg/dL (ref 2.5–4.6)
Potassium: 3.4 mmol/L — ABNORMAL LOW (ref 3.5–5.1)
Sodium: 139 mmol/L (ref 135–145)

## 2021-08-25 LAB — CBC WITH DIFFERENTIAL/PLATELET
Abs Immature Granulocytes: 0.55 10*3/uL — ABNORMAL HIGH (ref 0.00–0.07)
Basophils Absolute: 0.1 10*3/uL (ref 0.0–0.1)
Basophils Relative: 1 %
Eosinophils Absolute: 0.3 10*3/uL (ref 0.0–0.5)
Eosinophils Relative: 2 %
HCT: 24.3 % — ABNORMAL LOW (ref 39.0–52.0)
Hemoglobin: 8.1 g/dL — ABNORMAL LOW (ref 13.0–17.0)
Immature Granulocytes: 4 %
Lymphocytes Relative: 16 %
Lymphs Abs: 2.5 10*3/uL (ref 0.7–4.0)
MCH: 29.7 pg (ref 26.0–34.0)
MCHC: 33.3 g/dL (ref 30.0–36.0)
MCV: 89 fL (ref 80.0–100.0)
Monocytes Absolute: 1.4 10*3/uL — ABNORMAL HIGH (ref 0.1–1.0)
Monocytes Relative: 9 %
Neutro Abs: 10.6 10*3/uL — ABNORMAL HIGH (ref 1.7–7.7)
Neutrophils Relative %: 68 %
Platelets: 646 10*3/uL — ABNORMAL HIGH (ref 150–400)
RBC: 2.73 MIL/uL — ABNORMAL LOW (ref 4.22–5.81)
RDW: 12.4 % (ref 11.5–15.5)
WBC: 15.4 10*3/uL — ABNORMAL HIGH (ref 4.0–10.5)
nRBC: 0.1 % (ref 0.0–0.2)

## 2021-08-25 LAB — AEROBIC/ANAEROBIC CULTURE W GRAM STAIN (SURGICAL/DEEP WOUND): Culture: NO GROWTH

## 2021-08-25 LAB — ACID FAST SMEAR (AFB, MYCOBACTERIA): Acid Fast Smear: NEGATIVE

## 2021-08-25 MED ORDER — ACETAMINOPHEN 500 MG PO TABS: 1000.0000 mg | ORAL_TABLET | Freq: Four times a day (QID) | ORAL | 0 refills | Status: DC

## 2021-08-25 MED ORDER — AMOXICILLIN-POT CLAVULANATE 875-125 MG PO TABS: 1.0000 | ORAL_TABLET | Freq: Two times a day (BID) | ORAL | 0 refills | Status: DC

## 2021-08-25 NOTE — Discharge Summary (Signed)
Physician Discharge Summary  Philip Trevino ACZ:[id number] DOB: 1995-08-06 DOA: 08/14/2021  PCP: Kerin Perna, NP  Admit date: 08/14/2021 Discharge date: 08/25/2021   Discharge Diagnoses:  Principal Problem:   Acute on chronic renal failure [facility]) Active Problems:   Anemia, unspecified   Benign essential HTN   Leukocytosis   Hyperlipidemia   Pleural effusion, bilateral   Chest tube in place   [facility])   Malnutrition of moderate degree   Discharge Condition: Stable   Filed Weights   08/15/21 1400 08/21/21 0652  Weight: 78.8 kg 75.1 kg    History of present illness:  26 year old past medical history significant for focal segmental glomerulosclerosis, minimal-change disease, hypertension presents with multiple complaints.  Over the last 2 or 3 weeks he noticed loss of taste, fatigue, weakness.  Pleuritic chest pain.  Chest congestion.  Subjective fever.  Multiple episodes of diarrhea.  He was supposed to follow-up with nephrology, but due to transportation issue never follow-up.  Not taking any home medications.  Patient was found to have acute kidney injury with bilateral empyemas.  He had bilateral chest tubes placed.  Over the course of several days patient's renal function improved and his chest tubes were removed.  Recommended by pulmonology services to continue Augmentin for 6 weeks total.  He has follow-up arranged with CT surgery, pulmonology, and nephrology as an outpatient.  Also PCP in 1 to 2 weeks.  Patient is being discharged in stable and improved condition.  He is ambulating in the hallway with O2 sats on room air normal.   Evaluation in the ED: COVID-negative, BUN 76, creatinine 6.4 albumin less than 1.5, total protein 5.9.  Chest x-ray with bibasilar chest density left greater than the right.  Finding suggestive of pleural effusion compressive atelectasis.     Patient spike fever, WBC continue to be elevated. CT chest showed BL pleural effusion/ loculated. CCM  consulted for thoracentesis. CCM performed bedside US which showed bilateral loculated effusion without clear window. Recommendation was for IR chest tube placement.    Left side pleural effusion didn't improved with chest tube or lytics. CVTS consulted, he underwent Left VATS with drainage of left Empyema and decortication of the lower lobe on 1/30.    Hospital Course:  1-AKI in the setting of FSG: Hyponatremia; in setting renal failure -Renal biopsy-proven FSGS with frequent flares but usually associated with medication noncompliance -Normal creatinine October, November, 2 weeks ago creatinine 2.2 presenting with a creatinine over 6. -AKI related to ATN on top of FSGS.  -Appreciate nephrology assistance. -Nephrology considering further alternative agent to treat  FSG. Infection will need to be resolved/controlled.  Renal function markedly improved improve at 1.8 today   Bilateral Empyema:  Patient with Leukocytosis, Fever, BL loculated pleural effusion.  -Chest x ray with atelectasis vs consolidation. -Spike fever 1/27. CT chest 1/28 showed worsening BL pleural effusion left worse than right, loculated, consider empyema.  -CCM consulted, no clear window for safe thoracentesis, concern for empyema. -IR consulted for Chest tube placement. Underwent BL chest tube placement 1/28.  -Pleural fluid consistent with empyema, triglyceride level ordered never done -Pleural fluid growing streptococcus.  -Underwent VATS of Left pleural effusion. Fail pleural fibrinolytic on the left.  -Dr Nils Pyle following.  -Pain management per CVTS.  -We will need 6 weeks of antibiotics total per pulm -Both chest tubes removed this yesterday morning   Hyperlipidemia:  Continue with Zetia and Lipitor   Hypertension:  on Lasix. Hold lisinopril due to AKI.  Anemia /thrombocytosis: On Aranesp.  Hb stable.    Thrombocytosis, leukocytosis; related to infection.    Hypoalbuminemia -Suspect related to  hypoalbuminemia, in setting of FSG. -IV albumin given during hospitalization     Elevation of troponin: Likely demand in the setting of renal failure   Failure to thrive: HIV nonreactive     Discharge Exam: Vitals:   08/25/21 0751 08/25/21 1145  BP: (!) 148/111 (!) 147/106  Pulse: 80 (!) 105  Resp: (!) 21 17  Temp: 98.9 F (37.2 C) 98.7 F (37.1 C)  SpO2: 95% 93%    General: Alert and oriented no apparent distress Cardiovascular: Regular rate and rhythm without murmurs rubs or gallops Respiratory: Clear to auscultation bilaterally no wheezes rhonchi rales  Discharge Instructions   Discharge Instructions     Diet - low sodium heart healthy   Complete by: As directed    Discharge instructions   Complete by: As directed    Follow up with PCP 1- 2 weeks  Follow-up with CT surgery, pulmonology, nephrology as arranged   If the dressing is still on your incision site when you go home, remove it on the third day after your surgery date. Remove dressing if it begins to fall off, or if it is dirty or damaged before the third day.   Complete by: As directed    Increase activity slowly   Complete by: As directed       Allergies as of 08/25/2021       Reactions   Broccoli [brassica Oleracea] Hives   CAULIFLOWER (allergic, also)        Medication List     STOP taking these medications    aspirin 81 MG chewable tablet   lisinopril 10 MG tablet Commonly known as: ZESTRIL       TAKE these medications    acetaminophen 500 MG tablet Commonly known as: TYLENOL Take 2 tablets (1,000 mg total) by mouth every 6 (six) hours.   amoxicillin-clavulanate 875-125 MG tablet Commonly known as: AUGMENTIN Take 1 tablet by mouth every 12 (twelve) hours.   atorvastatin 80 MG tablet Commonly known as: LIPITOR Take 1 tablet (80 mg total) by mouth daily. What changed: Another medication with the same name was removed. Continue taking this medication, and follow the directions  you see here.   ezetimibe 10 MG tablet Commonly known as: ZETIA Take 1 tablet (10 mg total) by mouth daily. What changed: Another medication with the same name was removed. Continue taking this medication, and follow the directions you see here.   furosemide 80 MG tablet Commonly known as: LASIX TAKE 1 TABLET (80 MG TOTAL) BY MOUTH 2 (TWO) TIMES DAILY. What changed: Another medication with the same name was removed. Continue taking this medication, and follow the directions you see here.   THERAFLU FLU/COLD/COUGH PO Take 1 capsule by mouth daily as needed (For cold symptoms).               Discharge Care Instructions  (From admission, onward)           Start     Ordered   08/25/21 0000  If the dressing is still on your incision site when you go home, remove it on the third day after your surgery date. Remove dressing if it begins to fall off, or if it is dirty or damaged before the third day.        08/25/21 1133           Allergies  Allergen Reactions   Broccoli [Brassica Oleracea] Hives    CAULIFLOWER (allergic, also)    Follow-up Information     [facility] Follow up.   Specialty: Family Medicine Why: You are active with the above PCP. The office will contact you because they will need to work you in for an after hospital follow up visit. If you have any questions you can call the number listed above. Contact information: [place] [id number] [phone]        Rigoberto Noel, MD Follow up on 10/04/2021.   Specialty: Pulmonary Disease Why: report at 10 am for check in, then go for chest xray followed by your appointment w/ Dr Elsworth Soho at 10:30 Contact information: [place] [address] [phone]         Dahlia Byes, MD. Go to.   Specialty: Cardiothoracic Surgery Why: PA/LAT CXR to be taken (at [facility] which is in the same building as Dr. Lucianne Lei  Trigt's office) on at;Appointment time is at                 The results of significant diagnostics from this hospitalization (including imaging, microbiology, ancillary and laboratory) are listed below for reference.    Significant Diagnostic Studies: DG Chest 2 View  Result Date: 08/25/2021 CLINICAL DATA:  Removal of chest tube EXAM: CHEST - 2 VIEW COMPARISON:  Previous studies including the examination of 08/24/2021 FINDINGS: Transverse diameter of heart is increased. There is interval removal of bilateral chest tubes. There is blunting of both lateral CP angles more so on the left side. Linear densities seen in the left lower lung fields. There is small radiolucency in the lateral aspect of left lower lung fields which may be part of aeration in the left lower lung fields or small loculated pneumothorax. Subcutaneous emphysema is seen in the left lateral chest wall. There is no demonstrable apical pneumothorax. IMPRESSION: Interval removal of bilateral chest tubes. There is no apical pneumothorax. Small radiolucency in the lateral aspect of left lower lung fields may be part of the lung or small loculated pneumothorax. Small bilateral pleural effusions, more so on the left side. Linear densities in the left lower lung fields suggest subsegmental atelectasis. Electronically Signed   By: Elmer Picker M.D.   On: 08/25/2021 08:20   DG Chest 2 View  Result Date: 08/14/2021 CLINICAL DATA:  Chest pain. EXAM: CHEST - 2 VIEW COMPARISON:  09/27/2020 FINDINGS: Bibasilar chest densities, left side greater than right. Findings are suggestive for bilateral pleural effusions. Suspect compressive atelectasis but cannot exclude basilar airspace disease or consolidation. Upper lungs are clear. Heart size is incompletely evaluated due to the basilar chest densities. Negative for pneumothorax. IMPRESSION: 1. Bibasilar chest densities, left side greater than right. Findings are suggestive for pleural  effusions with compressive atelectasis or consolidation. Left pleural effusion could be moderate in size. Electronically Signed   By: Markus Daft M.D.   On: 08/14/2021 10:02   CT chest w/o contrast  Result Date: 08/20/2021 CLINICAL DATA:  Follow-up left empyema, treated with lytic therapy. EXAM: CT CHEST WITHOUT CONTRAST TECHNIQUE: Multidetector CT imaging of the chest was performed following the standard protocol without IV contrast. RADIATION DOSE REDUCTION: This exam was performed according to the departmental dose-optimization program which includes automated exposure control, adjustment of the mA and/or kV according to patient size and/or use of iterative reconstruction technique. COMPARISON:  08/18/2021 FINDINGS: Cardiovascular: No significant vascular  findings. Normal heart size. No pericardial effusion. Mediastinum/Nodes: No enlarged mediastinal or axillary lymph nodes. Thyroid gland, trachea, and esophagus demonstrate no significant findings. Lungs/Pleura: Interval bilateral pleural pigtail catheters. Moderate-sized left pleural effusion with an approximately 50% decrease in size. Interval small amount of interspersed air or gas in the pleural fluid on the left. Small right pleural effusion with an approximately 90% decrease in size. Bilateral lower lobe atelectasis without significant change. Upper Abdomen: Unremarkable. Musculoskeletal: Normal appearing bones. IMPRESSION: 1. Interval bilateral pleural catheters. 2. Moderate left pleural effusion with an approximately 50% decrease in size. 3. Small right pleural effusion with an approximately 90% decrease in size. 4. Interval small amount of air or gas interspersed in the left pleural fluid, most likely introduced by the left pleural catheter. The fact that this is suspended within the fluid indicates a thicker fluid. 5. No significant change in bilateral lower lobe atelectasis Electronically Signed   By: Claudie Revering M.D.   On: 08/20/2021 08:19   CT  CHEST WO CONTRAST  Result Date: 08/18/2021 CLINICAL DATA:  26 year old male with history of fever of unknown origin. EXAM: CT CHEST WITHOUT CONTRAST TECHNIQUE: Multidetector CT imaging of the chest was performed following the standard protocol without IV contrast. RADIATION DOSE REDUCTION: This exam was performed according to the departmental dose-optimization program which includes automated exposure control, adjustment of the mA and/or kV according to patient size and/or use of iterative reconstruction technique. COMPARISON:  No priors. FINDINGS: Cardiovascular: Heart size is normal. There is no significant pericardial fluid, thickening or pericardial calcification. No atherosclerotic calcifications are noted in the thoracic aorta or the coronary arteries. Diffuse low attenuation throughout the intravascular compartment, suggesting anemia. Mediastinum/Nodes: Multiple prominent borderline enlarged mediastinal and bilateral hilar lymph nodes are noted, nonspecific, but likely reactive. Esophagus is unremarkable in appearance. No axillary lymphadenopathy. Lungs/Pleura: Large left and moderate right pleural effusions. These are present predominantly posteriorly, however, these exerts substantial mass effect upon the underlying lung which is distorted, giving the appearance suggestive of probable loculation. This is associated with substantial passive atelectasis in the lower lobes of the lungs bilaterally, as well as the posterior aspect of the inferior segment of the lingula. Upper Abdomen: Unremarkable. Musculoskeletal: There are no aggressive appearing lytic or blastic lesions noted in the visualized portions of the skeleton. IMPRESSION: 1. Large left and moderate right pleural effusions, which are likely partially loculated. Given the patient's history of fever, the possibility of empyema should be considered. These are associated with substantial passive atelectasis in the lung bases bilaterally. 2. Diffuse low  attenuation throughout the intravascular compartment suggesting anemia. Electronically Signed   By: Vinnie Langton M.D.   On: 08/18/2021 08:32   US RENAL  Result Date: 08/14/2021 CLINICAL DATA:  Bloated abdomen EXAM: RENAL / URINARY TRACT ULTRASOUND COMPLETE COMPARISON:  None. FINDINGS: Right Kidney: Renal measurements: 14.6 x 5.5 x 6.6 cm = volume: 278 mL. Diffusely increased echotexture. No mass or hydronephrosis Left Kidney: Renal measurements: 14.8 x 6.0 x 5.3 cm = volume: 248 mL. Diffusely increased echotexture. No mass or hydronephrosis. Bladder: Appears normal for degree of bladder distention. Other: None. IMPRESSION: Increased echotexture throughout the kidneys. This can be seen with chronic renal disease, sickle cell disease, HIV, but also can be seen as a normal variant. No hydronephrosis. Electronically Signed   By: Rolm Baptise M.D.   On: 08/14/2021 17:45   DG Chest Port 1 View  Result Date: 08/24/2021 CLINICAL DATA:  Bilateral empyema. EXAM: PORTABLE CHEST 1 VIEW  COMPARISON:  August 23, 2021. FINDINGS: Stable cardiomediastinal silhouette. Stable bilateral chest tubes without definite pneumothorax. Stable left basilar atelectasis or pneumonia is noted with probable small left pleural effusion. Minimal right basilar subsegmental atelectasis is noted. Bony thorax is unremarkable. IMPRESSION: Stable bilateral chest tubes. Stable left basilar atelectasis or pneumonia with probable small left pleural effusion. Minimal right basilar subsegmental atelectasis. Electronically Signed   By: Marijo Conception M.D.   On: 08/24/2021 08:13   DG Chest Port 1 View  Result Date: 08/23/2021 CLINICAL DATA:  Chest pain. EXAM: PORTABLE CHEST 1 VIEW COMPARISON:  08/20/2021 FINDINGS: Trace right pleural effusion. Right-sided chest tube in unchanged position. Small left pleural effusion. Left-sided chest tube in satisfactory position. Left lower lobe airspace disease concerning for pneumonia. Hazy right lower lobe  airspace disease may reflect atelectasis versus pneumonia. No pneumothorax. Stable cardiomediastinal silhouette. No aggressive osseous lesion. IMPRESSION: 1. Bilateral chest tubes in satisfactory position. 2. Trace right pleural effusion. Right lower lobe airspace disease which may reflect atelectasis versus pneumonia. 3. Small left pleural effusion. Left lower lobe airspace disease concerning for pneumonia. Electronically Signed   By: Kathreen Devoid M.D.   On: 08/23/2021 07:21   DG Chest Port 1 View  Result Date: 08/22/2021 CLINICAL DATA:  Follow up pleural effusions. Status post left-sided VATS for empyema. EXAM: PORTABLE CHEST 1 VIEW COMPARISON:  Radiographs 08/21/2021 and 08/20/2021.  CT 08/20/2021. FINDINGS: 0638 hours. Right pigtail and left surgical chest tubes remain in place inferiorly. Small loculated hydropneumothorax at the left costophrenic angle is unchanged. The overall aeration of the lungs has improved. The heart size and mediastinal contours are stable. There is stable soft tissue emphysema within the left lateral chest wall. IMPRESSION: Improving aeration of the lung bases. No significant change in volume of small left-sided hydropneumothorax. Electronically Signed   By: Richardean Sale M.D.   On: 08/22/2021 08:13   DG Chest Port 1 View  Result Date: 08/21/2021 CLINICAL DATA:  Pneumothorax EXAM: PORTABLE CHEST 1 VIEW COMPARISON:  Previous studies including the examination of 08/20/2021 FINDINGS: Transverse diameter of heart is increased. Central pulmonary vessels are prominent. There is a pigtail right chest tube with its tip in the medial right lower lung fields. There is a larger caliber left chest tube with its tip in the medial left mid lung fields. There is small loculated pneumothorax near the left lateral costophrenic angle. There is no demonstrable apical pneumothorax. Subcutaneous emphysema is seen in the left chest wall. There are patchy infiltrates in both lower lung fields, more  so on the left side. IMPRESSION: Small loculated left pneumothorax is seen in the lateral aspect of left lower lung fields. Small left pleural effusion. There are patchy infiltrates in both lower lung fields suggesting atelectasis/pneumonia. Electronically Signed   By: Elmer Picker M.D.   On: 08/21/2021 08:02   DG Chest Port 1 View  Result Date: 08/20/2021 CLINICAL DATA:  Pleural effusions EXAM: PORTABLE CHEST 1 VIEW COMPARISON:  Previous studies including the examination done earlier today FINDINGS: Transverse diameter of heart is increased. No significant changes seen in the right pleural pigtail catheter. There is interval improvement in aeration of right parahilar region suggesting decrease in effusion in the interlobar fissure. There is interval replacement of pigtail left chest tube with a larger caliber chest tube. There is improvement in aeration of left lower lung fields suggesting significant decrease in left pleural effusion. There is small loculated pneumothorax in the lateral aspect of left lower lung fields. There is  no definite left apical pneumothorax. Subcutaneous emphysema is seen in the left chest wall. IMPRESSION: There is significant interval decrease in the left pleural effusion after placement of larger caliber left chest tube. There is a small residual left pleural effusion and small loculated pneumothorax in the lateral aspect of left lower lung fields. Electronically Signed   By: Elmer Picker M.D.   On: 08/20/2021 13:40   DG Chest Port 1 View  Result Date: 08/20/2021 CLINICAL DATA:  Chest tubes. EXAM: PORTABLE CHEST 1 VIEW COMPARISON:  Chest XR, most recently 08/19/2021. CT chest, 08/18/2021. IR CT, 08/18/2018. FINDINGS: Support lines: Stable positioning of bilateral basilar-directed thoracostomy tubes. Obscured cardiac apex. Hypoinflated lungs. Similar appearance with LEFT basilar consolidation and moderate pleural effusion. Trace RIGHT pleural effusion. No  pneumothorax. No interval osseous abnormality. IMPRESSION: 1. Stable positioning of bilateral pigtail thoracostomy tubes. No pneumothorax. 2. Similar LEFT basilar consolidation and moderate pleural effusion. 3. Trace RIGHT pleural effusion Electronically Signed   By: Michaelle Birks M.D.   On: 08/20/2021 07:17   DG Chest Port 1 View  Result Date: 08/19/2021 CLINICAL DATA:  Bilateral pleural effusions. EXAM: PORTABLE CHEST 1 VIEW COMPARISON:  Radiograph August 14, 2021 and CT August 18, 2021. FINDINGS: Pigtail thoracostomy is in the bilateral lung bases. Similar size of the small moderate right with slightly decreased size of the moderate left pleural effusions. Adjacent airspace opacities are similar. The heart size and mediastinal contours are unchanged. The visualized skeletal structures are stable. IMPRESSION: Similar size of the small-moderate right pleural effusion with slightly decreased size of the moderate left pleural effusion and stable adjacent opacities which may reflect atelectasis or infection. Electronically Signed   By: Dahlia Bailiff M.D.   On: 08/19/2021 08:17   CT [facility] PLEURAL DRAIN W/INDWELL CATH W/IMG GUIDE  Result Date: 08/18/2021 INDICATION: Bilateral loculated pleural effusions.  Question empyema. EXAM: CT PERC PLEURAL DRAIN W/INDWELL CATH W/IMG GUIDE Procedures: CT-GUIDED BILATERAL PIGTAIL THORACOSTOMY TUBE PLACEMENT RADIATION DOSE REDUCTION: This exam was performed according to the departmental dose-optimization program which includes automated exposure control, adjustment of the mA and/or kV according to patient size and/or use of iterative reconstruction technique. COMPARISON:  CT chest, earlier same day. MEDICATIONS: The patient is currently admitted to the hospital and receiving intravenous antibiotics. The antibiotics were administered within an appropriate time frame prior to the initiation of the procedure. ANESTHESIA/SEDATION: Local anesthetic and single agent sedation was  employed during this procedure. A total of Versed 4 mg was administered intravenously. The patient's level of consciousness and vital signs were monitored continuously by radiology nursing throughout the procedure under my direct supervision. CONTRAST:  None COMPLICATIONS: None immediate. PROCEDURE: Informed written consent was obtained from the patient and/or patient's representative after a discussion of the risks, benefits and alternatives to treatment. The patient was placed supine on the CT gantry and a pre procedural CT was performed re-demonstrating the bilateral pleural collections. The procedure was planned. A timeout was performed prior to the initiation of the procedure. The RIGHT and LEFT chest was prepped and draped in the usual sterile fashion. The procedure began in the patient's LEFT side. The overlying soft tissues were anesthetized with 1% lidocaine with epinephrine. Appropriate trajectory was planned with the use of a 22 gauge spinal needle. An 18 gauge trocar needle was advanced into the pleural collection and a short Amplatz super stiff wire was coiled within the collection. Appropriate positioning was confirmed with a limited CT scan. The tract was serially dilated allowing placement of  a 42 [place] all-purpose drainage catheter. Upon confirmation of the patient's stable vital signs, the procedure was repeated as above on the patient's RIGHT side. Following bilateral pleural drainage catheter placement, appropriate positioning was confirmed with a limited postprocedural CT scan. Approximally 10 mL of milky pleural fluid was aspirated from each drain and submitted for microbiological analysis. The tubes were connected to pleura vacs on negative suction and sutured in place. Dressings were placed. The patient tolerated the procedure well without immediate post procedural complication. IMPRESSION: Successful CT-guided placement of bilateral 12 Fr pleural drainage catheters into the posterior  dependent portions of the loculated pleural collections. Samples were sent to the laboratory as requested by the ordering clinical team. Michaelle Birks, MD [facility] Electronically Signed   By: Michaelle Birks M.D.   On: 08/18/2021 19:49   CT [facility] PLEURAL DRAIN W/INDWELL CATH W/IMG GUIDE  Result Date: 08/18/2021 INDICATION: Bilateral loculated pleural effusions.  Question empyema. EXAM: CT PERC PLEURAL DRAIN W/INDWELL CATH W/IMG GUIDE Procedures: CT-GUIDED BILATERAL PIGTAIL THORACOSTOMY TUBE PLACEMENT RADIATION DOSE REDUCTION: This exam was performed according to the departmental dose-optimization program which includes automated exposure control, adjustment of the mA and/or kV according to patient size and/or use of iterative reconstruction technique. COMPARISON:  CT chest, earlier same day. MEDICATIONS: The patient is currently admitted to the hospital and receiving intravenous antibiotics. The antibiotics were administered within an appropriate time frame prior to the initiation of the procedure. ANESTHESIA/SEDATION: Local anesthetic and single agent sedation was employed during this procedure. A total of Versed 4 mg was administered intravenously. The patient's level of consciousness and vital signs were monitored continuously by radiology nursing throughout the procedure under my direct supervision. CONTRAST:  None COMPLICATIONS: None immediate. PROCEDURE: Informed written consent was obtained from the patient and/or patient's representative after a discussion of the risks, benefits and alternatives to treatment. The patient was placed supine on the CT gantry and a pre procedural CT was performed re-demonstrating the bilateral pleural collections. The procedure was planned. A timeout was performed prior to the initiation of the procedure. The RIGHT and LEFT chest was prepped and draped in the usual sterile fashion. The procedure began in the patient's  LEFT side. The overlying soft tissues were anesthetized with 1% lidocaine with epinephrine. Appropriate trajectory was planned with the use of a 22 gauge spinal needle. An 18 gauge trocar needle was advanced into the pleural collection and a short Amplatz super stiff wire was coiled within the collection. Appropriate positioning was confirmed with a limited CT scan. The tract was serially dilated allowing placement of a 12 [place] all-purpose drainage catheter. Upon confirmation of the patient's stable vital signs, the procedure was repeated as above on the patient's RIGHT side. Following bilateral pleural drainage catheter placement, appropriate positioning was confirmed with a limited postprocedural CT scan. Approximally 10 mL of milky pleural fluid was aspirated from each drain and submitted for microbiological analysis. The tubes were connected to pleura vacs on negative suction and sutured in place. Dressings were placed. The patient tolerated the procedure well without immediate post procedural complication. IMPRESSION: Successful CT-guided placement of bilateral 12 Fr pleural drainage catheters into the posterior dependent portions of the loculated pleural collections. Samples were sent to the laboratory as requested by the ordering clinical team. Michaelle Birks, MD [facility] Electronically Signed   By: Michaelle Birks M.D.   On: 08/18/2021 19:49   VAS [place] LOWER EXTREMITY VENOUS (DVT)  Result Date: 08/19/2021  Lower Venous DVT Study Patient Name:  RODDIE RIEGLER  Date of Exam:   08/18/2021 Medical Rec #: [id number]      Accession #:    [id number] Date of Birth: 06/28/1996       Patient Gender: M Patient Age:   86 years Exam Location:  [facility] Procedure:      VAS [place] LOWER EXTREMITY VENOUS (DVT) Referring Phys: Jerald Kief REGALADO --------------------------------------------------------------------------------  Indications: Swelling.  Risk Factors:  None identified. Comparison Study: No prior studies. Performing Technologist: Oliver Hum RVT  Examination Guidelines: A complete evaluation includes B-mode imaging, spectral Doppler, color Doppler, and power Doppler as needed of all accessible portions of each vessel. Bilateral testing is considered an integral part of a complete examination. Limited examinations for reoccurring indications may be performed as noted. The reflux portion of the exam is performed with the patient in reverse Trendelenburg.  +---------+---------------+---------+-----------+----------+--------------+  RIGHT     Compressibility Phasicity Spontaneity Properties Thrombus Aging  +---------+---------------+---------+-----------+----------+--------------+  CFV       Full            Yes       Yes                                    +---------+---------------+---------+-----------+----------+--------------+  SFJ       Full                                                             +---------+---------------+---------+-----------+----------+--------------+  FV Prox   Full                                                             +---------+---------------+---------+-----------+----------+--------------+  FV Mid    Full                                                             +---------+---------------+---------+-----------+----------+--------------+  FV Distal Full                                                             +---------+---------------+---------+-----------+----------+--------------+  PFV       Full                                                             +---------+---------------+---------+-----------+----------+--------------+  POP       Full            Yes  Yes                                    +---------+---------------+---------+-----------+----------+--------------+  PTV       Full                                                              +---------+---------------+---------+-----------+----------+--------------+  PERO      Full                                                             +---------+---------------+---------+-----------+----------+--------------+   +---------+---------------+---------+-----------+----------+--------------+  LEFT      Compressibility Phasicity Spontaneity Properties Thrombus Aging  +---------+---------------+---------+-----------+----------+--------------+  CFV       Full            Yes       Yes                                    +---------+---------------+---------+-----------+----------+--------------+  SFJ       Full                                                             +---------+---------------+---------+-----------+----------+--------------+  FV Prox   Full                                                             +---------+---------------+---------+-----------+----------+--------------+  FV Mid    Full                                                             +---------+---------------+---------+-----------+----------+--------------+  FV Distal Full                                                             +---------+---------------+---------+-----------+----------+--------------+  PFV       Full                                                             +---------+---------------+---------+-----------+----------+--------------+  POP       Full            Yes       Yes                                    +---------+---------------+---------+-----------+----------+--------------+  PTV       Full                                                             +---------+---------------+---------+-----------+----------+--------------+  PERO      Full                                                             +---------+---------------+---------+-----------+----------+--------------+     Summary: RIGHT: - There is no evidence of deep vein thrombosis in the lower extremity.  - No cystic structure found in  the popliteal fossa.  LEFT: - There is no evidence of deep vein thrombosis in the lower extremity.  - No cystic structure found in the popliteal fossa.  *See table(s) above for measurements and observations. Electronically signed by Servando Snare MD on 08/19/2021 at 9:29:33 AM.    Final     Microbiology: Recent Results (from the past 240 hour(s))  Urine Culture     Status: Abnormal   Collection Time: 08/16/21  9:38 AM   Specimen: Urine, Clean Catch  Result Value Ref Range Status   Specimen Description URINE, CLEAN CATCH  Final   Special Requests NONE  Final   Culture (A)  Final    <10,000 COLONIES/mL INSIGNIFICANT GROWTH Performed at [facility], [address] [place], [place] [address]    Report Status 08/17/2021 FINAL  Final  Culture, blood (routine x 2)     Status: None   Collection Time: 08/18/21  9:13 AM   Specimen: BLOOD  Result Value Ref Range Status   Specimen Description BLOOD LEFT ANTECUBITAL  Final   Special Requests   Final    BOTTLES DRAWN AEROBIC AND ANAEROBIC Blood Culture adequate volume   Culture   Final    NO GROWTH 5 DAYS Performed at [facility], [place] [address], [place], [place] [address]    Report Status 08/23/2021 FINAL  Final  Culture, blood (routine x 2)     Status: None   Collection Time: 08/18/21  9:25 AM   Specimen: BLOOD LEFT HAND  Result Value Ref Range Status   Specimen Description BLOOD LEFT HAND  Final   Special Requests   Final    BOTTLES DRAWN AEROBIC AND ANAEROBIC Blood Culture adequate volume   Culture   Final    NO GROWTH 5 DAYS Performed at [facility], [place] [address] [place], [place] [address]    Report Status 08/23/2021 FINAL  Final  Aerobic/Anaerobic Culture w Gram Stain (surgical/deep wound)     Status: None   Collection Time: 08/18/21  2:57 PM   Specimen: Pleural Fluid  Result Value Ref Range Status   Specimen Description PLEURAL FLUID  Final   Special Requests RIGHT CHEST  Final   Gram Stain   Final     ABUNDANT WBC PRESENT,BOTH PMN AND MONONUCLEAR NO ORGANISMS SEEN    Culture   Final    RARE STREPTOCOCCUS PNEUMONIAE SUSCEPTIBILITIES PERFORMED ON PREVIOUS CULTURE WITHIN THE LAST 5 DAYS. NO ANAEROBES ISOLATED Performed at [facility], [place] [address] [place], [place] [address]    Report Status 08/23/2021 FINAL  Final  Aerobic/Anaerobic Culture w Gram Stain (surgical/deep wound)     Status: None   Collection Time: 08/18/21  2:58 PM   Specimen: Pleural Fluid  Result Value Ref Range Status   Specimen Description PLEURAL FLUID  Final   Special Requests LEFT CHEST  Final   Gram Stain   Final    ABUNDANT WBC PRESENT,BOTH PMN AND MONONUCLEAR NO ORGANISMS SEEN    Culture   Final    FEW STREPTOCOCCUS PNEUMONIAE CRITICAL RESULT CALLED TO, READ BACK BY AND VERIFIED WITH: RN [place] ON [id number] AT 1120 BY E.PARRISH NO ANAEROBES ISOLATED Performed at [facility], [place] [address] [place], [place] [address]    Report Status 08/23/2021 FINAL  Final   Organism ID, Bacteria STREPTOCOCCUS PNEUMONIAE  Final      Susceptibility   Streptococcus pneumoniae - MIC*    ERYTHROMYCIN >=8 RESISTANT Resistant     LEVOFLOXACIN 0.5 SENSITIVE Sensitive     VANCOMYCIN <=0.12 SENSITIVE Sensitive     PENICILLIN (meningitis) 0.25 RESISTANT Resistant     PENO - penicillin 0.25      PENICILLIN (non-meningitis) 0.25 SENSITIVE Sensitive     PENICILLIN (oral) 0.25 INTERMEDIATE Intermediate     CEFTRIAXONE (non-meningitis) <=0.12 SENSITIVE Sensitive     CEFTRIAXONE (meningitis) <=0.12 SENSITIVE Sensitive     * FEW STREPTOCOCCUS PNEUMONIAE  Surgical pcr screen     Status: None   Collection Time: 08/20/21  7:56 AM   Specimen: Nasal Mucosa; Nasal Swab  Result Value Ref Range Status   MRSA, PCR NEGATIVE NEGATIVE Final   Staphylococcus aureus NEGATIVE NEGATIVE Final    Comment: (NOTE) The Xpert SA Assay (FDA approved for NASAL specimens in patients 77 years of age and older), is one component of a  comprehensive surveillance program. It is not intended to diagnose infection nor to guide or monitor treatment. Performed at [facility], [place] [address] [place], [place] [address]   Body fluid culture w Gram Stain     Status: None   Collection Time: 08/20/21 10:58 AM   Specimen: PATH Other; Body Fluid  Result Value Ref Range Status   Specimen Description PLEURAL FLUID  Final   Special Requests NONE  Final   Gram Stain   Final    ABUNDANT WBC PRESENT, PREDOMINANTLY PMN NO ORGANISMS SEEN    Culture   Final    NO GROWTH 3 DAYS Performed at [facility], [address], [place], [place] [address]    Report Status 08/23/2021 FINAL  Final  Aerobic/Anaerobic Culture w Gram Stain (surgical/deep wound)     Status: None (Preliminary result)   Collection Time: 08/20/21 11:02 AM   Specimen: PATH Other; Body Fluid  Result Value Ref Range Status   Specimen Description PLEURAL  Final   Special Requests NONE  Final   Gram Stain   Final    ABUNDANT WBC PRESENT, PREDOMINANTLY PMN NO ORGANISMS SEEN Performed at [facility], [address] [place], [place] [address]    Culture   Final  CULTURE REINCUBATED FOR BETTER GROWTH NO ANAEROBES ISOLATED; CULTURE IN PROGRESS FOR 5 DAYS   Report Status PENDING  Incomplete  Acid Fast Smear (AFB)     Status: None   Collection Time: 08/20/21 11:02 AM   Specimen: PATH Other; Body Fluid  Result Value Ref Range Status   AFB Specimen Processing Concentration  Final   Acid Fast Smear Negative  Final    Comment: (NOTE) Performed At: [facility] [place], [place] [id number] Rush Farmer MD NF:[id number]    Source (AFB) PLEURAL  Final    Comment: Performed at [facility], [place] [address], [place], [place] [address]     Labs: Basic Metabolic Panel: Recent Labs  Lab 08/20/21 0051 08/21/21 0327 08/21/21 0328 08/22/21 0106 08/23/21 0049 08/24/21 0105 08/25/21 0104  NA 137   < > 137 140 140 139 139   K 4.6   < > 4.7 3.9 3.4* 3.3* 3.4*  CL 107  --  106 107 106 106 108  CO2 22  --  21* 22 24 25 24   GLUCOSE 110*  --  127* 103* 90 87 130*  BUN 62*  --  58* 57* 42* 27* 17  CREATININE 5.37*  --  5.09* 4.79* 3.49* 2.63* 1.85*  CALCIUM 7.4*  --  7.4* 7.4* 7.5* 7.3* 7.4*  PHOS 9.4*  --  9.4*  --  5.7* 4.8* 3.2   < > = values in this interval not displayed.   Liver Function Tests: Recent Labs  Lab 08/21/21 0328 08/22/21 0106 08/23/21 0049 08/24/21 0105 08/25/21 0104  AST  --  17  --   --   --   ALT  --  8  --   --   --   ALKPHOS  --  49  --   --   --   BILITOT  --  <0.1*  --   --   --   PROT  --  5.9*  --   --   --   ALBUMIN <1.5* 1.6* 2.1* 1.9* 1.7*   No results for input(s): LIPASE, AMYLASE in the last 168 hours. No results for input(s): AMMONIA in the last 168 hours. CBC: Recent Labs  Lab 08/21/21 0328 08/22/21 0106 08/23/21 0049 08/24/21 0105 08/25/21 0104  WBC 20.2* 16.3* 15.2* 12.8* 15.4*  NEUTROABS  --   --  10.3* 7.9* 10.6*  HGB 9.2* 8.1* 8.4* 8.0* 8.1*  HCT 27.7* 25.2* 25.3* 23.9* 24.3*  MCV 87.7 88.1 88.2 88.5 89.0  PLT 826* 746* 722* 655* 646*   Cardiac Enzymes: No results for input(s): CKTOTAL, CKMB, CKMBINDEX, TROPONINI in the last 168 hours. BNP: BNP (last 3 results) No results for input(s): BNP in the last 8760 hours.  ProBNP (last 3 results) No results for input(s): PROBNP in the last 8760 hours.  CBG: No results for input(s): GLUCAP in the last 168 hours.     Signed:  Phillips Grout MD.  [facility] 08/25/2021, 12:49 PM

## 2021-08-25 NOTE — Progress Notes (Addendum)
° °   °  [address]       [place] [address]             [phone]       5 Days Post-Op Procedure(s) (LRB): VIDEO ASSISTED THORACOSCOPY (VATS)/EMPYEMA (Left)  Subjective: Patient hopes to go home.  Objective: Vital signs in last 24 hours: Temp:  [98.5 F (36.9 C)-99.1 F (37.3 C)] 98.5 F (36.9 C) (02/04 0419) Pulse Rate:  [77-88] 80 (02/04 0751) Cardiac Rhythm: Normal sinus rhythm (02/04 0830) Resp:  [18-24] 21 (02/04 0751) BP: (146-148)/(106-116) 148/111 (02/04 0751) SpO2:  [95 %-98 %] 95 % (02/04 0751)     Intake/Output from previous day: 02/03 0701 - 02/04 0700 In: 51 [P.O.:640] Out: 800 [Urine:800]   Physical Exam:  Cardiovascular: RRR Pulmonary: Clear to auscultation bilaterally Extremities: No lower extremity edema. Wounds: Clean and dry.  No erythema or signs of infection.   Lab Results: CBC: Recent Labs    08/24/21 0105 08/25/21 0104  WBC 12.8* 15.4*  HGB 8.0* 8.1*  HCT 23.9* 24.3*  PLT 655* 646*    BMET:  Recent Labs    08/24/21 0105 08/25/21 0104  NA 139 139  K 3.3* 3.4*  CL 106 108  CO2 25 24  GLUCOSE 87 130*  BUN 27* 17  CREATININE 2.63* 1.85*  CALCIUM 7.3* 7.4*     PT/INR: No results for input(s): LABPROT, INR in the last 72 hours. ABG:  INR: Will add last result for INR, ABG once components are confirmed Will add last 4 CBG results once components are confirmed  Assessment/Plan:  1. CV - SR, hypertensive. Per medicine 2.  Pulmonary - On room air.  CXR this am shows left basilar atelectasis, small pleural effusion and minimal right basilar pleural effusion.Encourage incentive spirometer 3. ID-WBC slightly increased to 15,400. On Augmentin for bilateral Pneumococcal PNA/empyema.  4. AKI in the setting of FSG, ATN-creatinine decreased to 1.85. Nephrology following 5. Anemia-H and H this am slightly increased to 8.1 and 24.3. On Trinsicon. Per medicine 6. Follow up appointment arranged  Sharalyn Ink  [facility] 08/25/2021,8:49 AM [phone]   CXR stable Please call with questions  Lajuana Matte

## 2021-08-25 NOTE — Progress Notes (Signed)
RN went over d/c summary with pt. NT removed PIVs. Belongings w/ pt, including printed Rx. Social worker coordinating ride for pt. RN transported pt to hired transportation.

## 2021-08-25 NOTE — Social Work (Signed)
CSW assisted with transportation with [facility] for DC home. CSW arranged transportation and confirmed address.

## 2021-08-27 LAB — FUNGUS STAIN

## 2021-08-27 LAB — FUNGAL STAIN REFLEX

## 2021-09-07 ENCOUNTER — Other Ambulatory Visit: Payer: Self-pay | Admitting: Cardiothoracic Surgery

## 2021-09-07 DIAGNOSIS — J869 Pyothorax without fistula: Secondary | ICD-10-CM

## 2021-09-10 ENCOUNTER — Other Ambulatory Visit: Payer: Self-pay

## 2021-09-10 ENCOUNTER — Encounter: Payer: Self-pay | Admitting: Cardiothoracic Surgery

## 2021-09-10 ENCOUNTER — Ambulatory Visit
Admission: RE | Admit: 2021-09-10 | Discharge: 2021-09-10 | Disposition: A | Discharge status: Home / Self Care | Payer: Medicaid Other | Source: Ambulatory Visit | Attending: Cardiothoracic Surgery | Admitting: Cardiothoracic Surgery

## 2021-09-10 ENCOUNTER — Ambulatory Visit (INDEPENDENT_AMBULATORY_CARE_PROVIDER_SITE_OTHER): Payer: Self-pay | Admitting: Cardiothoracic Surgery

## 2021-09-10 VITALS — BP 146/104 | HR 94 | Resp 20 | Ht 70.0 in | Wt 161.4 lb

## 2021-09-10 DIAGNOSIS — J90 Pleural effusion, not elsewhere classified: Secondary | ICD-10-CM

## 2021-09-10 DIAGNOSIS — J869 Pyothorax without fistula: Secondary | ICD-10-CM

## 2021-09-10 DIAGNOSIS — Z9889 Other specified postprocedural states: Secondary | ICD-10-CM

## 2021-09-10 MED ORDER — GABAPENTIN 250 MG/5ML PO SOLN: 300.0000 mg | Freq: Every day | ORAL | 1 refills | Status: DC

## 2021-09-10 NOTE — Progress Notes (Signed)
°  HPI:  Patient returns for routine postoperative follow-up having undergone left VATS and drainage of streptococcal empyema 3 weeks ago.  The patient's early postoperative recovery while in the hospital was notable for rapid improvement of his pulmonary condition and improvement of his acute renal failure  Since hospital discharge the patient reports Progressively feeling better.  No fever.  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  He is completing his 6-week course of oral Augmentin.  Current Outpatient Medications  Medication Sig Dispense Refill   acetaminophen (TYLENOL) 500 MG tablet Take 2 tablets (1,000 mg total) by mouth every 6 (six) hours. 30 tablet 0   amoxicillin-clavulanate (AUGMENTIN) 875-125 MG tablet Take 1 tablet by mouth every 12 (twelve) hours. 45 tablet 0   atorvastatin (LIPITOR) 80 MG tablet Take 1 tablet (80 mg total) by mouth daily. 30 tablet 2   ezetimibe (ZETIA) 10 MG tablet Take 1 tablet (10 mg total) by mouth daily. 30 tablet 2   furosemide (LASIX) 80 MG tablet TAKE 1 TABLET (80 MG TOTAL) BY MOUTH 2 (TWO) TIMES DAILY. 60 tablet 2   gabapentin (NEURONTIN) 250 MG/5ML solution Take 6 mLs (300 mg total) by mouth at bedtime. 120 mL 1   Pseudoeph-CPM-DM-APAP (THERAFLU FLU/COLD/COUGH PO) Take 1 capsule by mouth daily as needed (For cold symptoms).     No current facility-administered medications for this visit.    Physical Exam Blood pressure 135/90, heart rate 94 sinus saturation 96% room air  Alert and comfortable Surgical incisions well-healed Breath sounds clear bilaterally Heart rate regular  Diagnostic Tests: Chest x-ray performed today is reviewed showing clear lung fields, no infiltrate or residual pleural collections.   Impression: Patient doing well after left VATS for drainage of streptococcal empyema.  Resume normal activities. He knows to complete his full course of oral antibiotic (Augmentin) We will give him a 3 to  4-week course of oral gabapentin for neuropathic pain.  Plan: Return for final surgical follow-up in about 6 weeks with repeat chest x-ray.  Dahlia Byes, MD [facility] [phone]

## 2021-10-03 ENCOUNTER — Other Ambulatory Visit: Payer: Self-pay | Admitting: Pulmonary Disease

## 2021-10-03 DIAGNOSIS — J90 Pleural effusion, not elsewhere classified: Secondary | ICD-10-CM

## 2021-10-03 NOTE — Progress Notes (Signed)
CXR prior to OV per note.  ? ?

## 2021-10-04 ENCOUNTER — Inpatient Hospital Stay: Payer: Medicaid Other | Admitting: Pulmonary Disease

## 2021-10-04 ENCOUNTER — Ambulatory Visit: Payer: Medicaid Other

## 2021-10-06 LAB — ACID FAST CULTURE WITH REFLEXED SENSITIVITIES (MYCOBACTERIA): Acid Fast Culture: NEGATIVE

## 2021-10-07 LAB — ACID FAST CULTURE WITH REFLEXED SENSITIVITIES (MYCOBACTERIA): Acid Fast Culture: NEGATIVE

## 2021-10-23 ENCOUNTER — Other Ambulatory Visit: Payer: Self-pay | Admitting: Cardiothoracic Surgery

## 2021-10-23 DIAGNOSIS — J90 Pleural effusion, not elsewhere classified: Secondary | ICD-10-CM

## 2021-10-25 ENCOUNTER — Ambulatory Visit (INDEPENDENT_AMBULATORY_CARE_PROVIDER_SITE_OTHER): Payer: Self-pay | Admitting: Cardiothoracic Surgery

## 2021-10-25 ENCOUNTER — Encounter: Payer: Self-pay | Admitting: Cardiothoracic Surgery

## 2021-10-25 ENCOUNTER — Ambulatory Visit
Admission: RE | Admit: 2021-10-25 | Discharge: 2021-10-25 | Disposition: A | Discharge status: Home / Self Care | Payer: Medicaid Other | Source: Ambulatory Visit | Attending: Cardiothoracic Surgery | Admitting: Cardiothoracic Surgery

## 2021-10-25 VITALS — BP 130/90 | HR 86 | Resp 20 | Ht 70.0 in | Wt 191.0 lb

## 2021-10-25 DIAGNOSIS — Z9889 Other specified postprocedural states: Secondary | ICD-10-CM

## 2021-10-25 DIAGNOSIS — J90 Pleural effusion, not elsewhere classified: Secondary | ICD-10-CM

## 2021-10-25 DIAGNOSIS — J869 Pyothorax without fistula: Secondary | ICD-10-CM

## 2021-10-25 DIAGNOSIS — Z09 Encounter for follow-up examination after completed treatment for conditions other than malignant neoplasm: Secondary | ICD-10-CM

## 2021-10-25 NOTE — Progress Notes (Signed)
?  HPI: ?Patient returns for final postoperative follow-up having undergone left VATS for drainage of empyema over 2 months ago. ?The patient received an extended treatment of IV antibiotics for streptococcal empyema.  He has been stable off antibiotics for several weeks.  Chest x-ray today is reviewed and shows no evidence of recurrence with only minimal pleural thickening. ?Patient reports no shortness of breath or cough or fever. ?He is experiencing significant fluid retention related to his chronic renal insufficiency from nephrotic syndrome. ?The patient's pulmonary infection has cleared and he is a candidate to receive immunotherapy to improve his renal function. ? ?He denies any significant postthoracotomy pain and is using only Tylenol as needed. ?Current Outpatient Medications  ?Medication Sig Dispense Refill  ? acetaminophen (TYLENOL) 500 MG tablet Take 2 tablets (1,000 mg total) by mouth every 6 (six) hours. 30 tablet 0  ? furosemide (LASIX) 80 MG tablet TAKE 1 TABLET (80 MG TOTAL) BY MOUTH 2 (TWO) TIMES DAILY. 60 tablet 2  ? atorvastatin (LIPITOR) 80 MG tablet Take 1 tablet (80 mg total) by mouth daily. (Patient not taking: Reported on 10/25/2021) 30 tablet 2  ? ezetimibe (ZETIA) 10 MG tablet Take 1 tablet (10 mg total) by mouth daily. (Patient not taking: Reported on 10/25/2021) 30 tablet 2  ? gabapentin (NEURONTIN) 250 MG/5ML solution Take 6 mLs (300 mg total) by mouth at bedtime. (Patient not taking: Reported on 10/25/2021) 120 mL 1  ? Pseudoeph-CPM-DM-APAP (THERAFLU FLU/COLD/COUGH PO) Take 1 capsule by mouth daily as needed (For cold symptoms). (Patient not taking: Reported on 10/25/2021)    ? ?No current facility-administered medications for this visit.  ? ? ?Physical Exam: ?Camelia Eng has cushingoid facies and evidence of fluid retention from prednisone ?Lungs-clear bilaterally.  Well-healed left VATS incision ?Heart rhythm regular murmur ? ?Diagnostic Tests: ?Chest x-ray taken today is personally reviewed  showing no evidence of recurrent empyema pulmonary function.  He has stable and as expected pleural thickening at the left costophrenic angle, ? ?Impression: ?Resolved left empyema status post VATS, drainage and decortication of left lower lobe. ? ?Plan: ?Patient advised to avoid firsthand or secondhand smoke. ?He is a candidate to receive more aggressive immunotherapy for his nephrotic syndrome. ? ? ?Dahlia Byes, MD ?[facility] ?([phone] ? ?

## 2021-10-29 ENCOUNTER — Ambulatory Visit: Payer: Medicaid Other | Admitting: Cardiothoracic Surgery

## 2021-11-29 ENCOUNTER — Inpatient Hospital Stay (HOSPITAL_COMMUNITY)
Admission: EM | Admit: 2021-11-29 | Discharge: 2021-12-06 | DRG: 641 | Disposition: A | Discharge status: Home / Self Care | Payer: Medicaid Other | Attending: Internal Medicine | Admitting: Internal Medicine

## 2021-11-29 ENCOUNTER — Other Ambulatory Visit: Payer: Self-pay

## 2021-11-29 ENCOUNTER — Encounter (HOSPITAL_COMMUNITY): Payer: Self-pay

## 2021-11-29 ENCOUNTER — Emergency Department (HOSPITAL_COMMUNITY): Payer: Medicaid Other

## 2021-11-29 DIAGNOSIS — D631 Anemia in chronic kidney disease: Secondary | ICD-10-CM | POA: Diagnosis present

## 2021-11-29 DIAGNOSIS — E785 Hyperlipidemia, unspecified: Secondary | ICD-10-CM | POA: Diagnosis present

## 2021-11-29 DIAGNOSIS — N049 Nephrotic syndrome with unspecified morphologic changes: Secondary | ICD-10-CM

## 2021-11-29 DIAGNOSIS — I129 Hypertensive chronic kidney disease with stage 1 through stage 4 chronic kidney disease, or unspecified chronic kidney disease: Secondary | ICD-10-CM | POA: Diagnosis present

## 2021-11-29 DIAGNOSIS — K922 Gastrointestinal hemorrhage, unspecified: Secondary | ICD-10-CM | POA: Diagnosis present

## 2021-11-29 DIAGNOSIS — J90 Pleural effusion, not elsewhere classified: Secondary | ICD-10-CM | POA: Diagnosis present

## 2021-11-29 DIAGNOSIS — N179 Acute kidney failure, unspecified: Secondary | ICD-10-CM | POA: Diagnosis present

## 2021-11-29 DIAGNOSIS — Z91148 Patient's other noncompliance with medication regimen for other reason: Secondary | ICD-10-CM

## 2021-11-29 DIAGNOSIS — R601 Generalized edema: Secondary | ICD-10-CM | POA: Diagnosis present

## 2021-11-29 DIAGNOSIS — F17210 Nicotine dependence, cigarettes, uncomplicated: Secondary | ICD-10-CM | POA: Diagnosis present

## 2021-11-29 DIAGNOSIS — D649 Anemia, unspecified: Secondary | ICD-10-CM

## 2021-11-29 DIAGNOSIS — N1831 Chronic kidney disease, stage 3a: Secondary | ICD-10-CM | POA: Diagnosis present

## 2021-11-29 DIAGNOSIS — E8770 Fluid overload, unspecified: Principal | ICD-10-CM | POA: Diagnosis present

## 2021-11-29 DIAGNOSIS — Z79899 Other long term (current) drug therapy: Secondary | ICD-10-CM

## 2021-11-29 DIAGNOSIS — Z87441 Personal history of nephrotic syndrome: Secondary | ICD-10-CM

## 2021-11-29 DIAGNOSIS — Z91018 Allergy to other foods: Secondary | ICD-10-CM

## 2021-11-29 LAB — PHOSPHORUS: Phosphorus: 5.1 mg/dL — ABNORMAL HIGH (ref 2.5–4.6)

## 2021-11-29 LAB — COMPREHENSIVE METABOLIC PANEL
ALT: 7 U/L (ref 0–44)
AST: 15 U/L (ref 15–41)
Albumin: <1.5 g/dL — ABNORMAL LOW (ref 3.5–5.0)
Alkaline Phosphatase: 69 U/L (ref 38–126)
BUN: 26 mg/dL — ABNORMAL HIGH (ref 6–20)
CO2: 21 mmol/L — ABNORMAL LOW (ref 22–32)
Calcium: 7 mg/dL — ABNORMAL LOW (ref 8.9–10.3)
Chloride: 117 mmol/L — ABNORMAL HIGH (ref 98–111)
Creatinine, Ser: 2.21 mg/dL — ABNORMAL HIGH (ref 0.61–1.24)
GFR, Estimated: 41 mL/min — ABNORMAL LOW (ref >60)
Glucose, Bld: 98 mg/dL (ref 70–99)
Potassium: 4.2 mmol/L (ref 3.5–5.1)
Sodium: 138 mmol/L (ref 135–145)
Total Bilirubin: 0.4 mg/dL (ref 0.3–1.2)
Total Protein: 3.8 g/dL — ABNORMAL LOW (ref 6.5–8.1)

## 2021-11-29 LAB — CBC WITH DIFFERENTIAL/PLATELET
Abs Immature Granulocytes: 0.11 10*3/uL — ABNORMAL HIGH (ref 0.00–0.07)
Basophils Absolute: 0.1 10*3/uL (ref 0.0–0.1)
Basophils Relative: 1 %
Eosinophils Absolute: 0.7 10*3/uL — ABNORMAL HIGH (ref 0.0–0.5)
Eosinophils Relative: 6 %
HCT: 28.4 % — ABNORMAL LOW (ref 39.0–52.0)
Hemoglobin: 9.9 g/dL — ABNORMAL LOW (ref 13.0–17.0)
Immature Granulocytes: 1 %
Lymphocytes Relative: 27 %
Lymphs Abs: 3.2 10*3/uL (ref 0.7–4.0)
MCH: 28.8 pg (ref 26.0–34.0)
MCHC: 34.9 g/dL (ref 30.0–36.0)
MCV: 82.6 fL (ref 80.0–100.0)
Monocytes Absolute: 0.7 10*3/uL (ref 0.1–1.0)
Monocytes Relative: 6 %
Neutro Abs: 7 10*3/uL (ref 1.7–7.7)
Neutrophils Relative %: 59 %
Platelets: 517 10*3/uL — ABNORMAL HIGH (ref 150–400)
RBC: 3.44 MIL/uL — ABNORMAL LOW (ref 4.22–5.81)
RDW: 13.3 % (ref 11.5–15.5)
WBC: 11.8 10*3/uL — ABNORMAL HIGH (ref 4.0–10.5)
nRBC: 0 % (ref 0.0–0.2)

## 2021-11-29 LAB — MAGNESIUM: Magnesium: 2 mg/dL (ref 1.7–2.4)

## 2021-11-29 NOTE — ED Provider Notes (Signed)
?Philip Trevino ?Provider Note ? ? ?CSN: [id number] ?Arrival date & time: 11/29/21  2255 ? ?  ? ?History ? ?Chief Complaint  ?Patient presents with  ? Facial Swelling  ? Leg Swelling  ? abdominal swelling  ? ? ?Philip Trevino is a 26 y.o. male. ? ?The history is provided by the patient and medical records.  ?Philip Trevino is a 26 y.o. male who presents to the Emergency Department complaining of swelling.  He has a history of nephrotic syndrome and complains of swelling to his face, legs.  He states that about 1 month ago he did have URI symptoms with mild sore throat, cough and noticed some increased swelling at that time.  His symptoms have progressively worsened, significantly over the last week.  He did have occasional nausea and vomiting 1 month ago, now resolved.  He is currently on furosemide daily.  He saw his nephrologist about 1 week ago and was prescribed a second medication but was unable to get it filled due to financial constraints.  He is unsure what the medication is.  No chest pain, shortness of breath.  He does have 2-3 loose stools daily.  No hematochezia or melena.  The only medication he currently takes is furosemide. ?  ? ?Home Medications ?Prior to Admission medications   ?Medication Sig Start Date End Date Taking? Authorizing Provider  ?acetaminophen (TYLENOL) 500 MG tablet Take 2 tablets (1,000 mg total) by mouth every 6 (six) hours. ?Patient not taking: Reported on 11/30/2021 08/25/21   Phillips Grout, MD  ?atorvastatin (LIPITOR) 80 MG tablet Take 1 tablet (80 mg total) by mouth daily. ?Patient not taking: Reported on 10/25/2021 10/04/20   Dessa Phi, DO  ?ezetimibe (ZETIA) 10 MG tablet Take 1 tablet (10 mg total) by mouth daily. ?Patient not taking: Reported on 10/25/2021 10/04/20   Dessa Phi, DO  ?furosemide (LASIX) 80 MG tablet TAKE 1 TABLET (80 MG TOTAL) BY MOUTH 2 (TWO) TIMES DAILY. ?Patient not taking: Reported on 11/30/2021 10/03/20 10/25/21  Dessa Phi,  DO  ?gabapentin (NEURONTIN) 250 MG/5ML solution Take 6 mLs (300 mg total) by mouth at bedtime. ?Patient not taking: Reported on 10/25/2021 09/10/21   Dahlia Byes, MD  ?   ? ?Allergies    ?Broccoli [brassica oleracea]   ? ?Review of Systems   ?Review of Systems  ?All other systems reviewed and are negative. ? ?Physical Exam ?Updated Vital Signs ?BP (!) 150/116   Pulse 89   Temp 98.3 ?F (36.8 ?C) (Oral)   Resp (!) 23   Ht 5\' 10"  (1.778 m)   Wt 96.4 kg   SpO2 100%   BMI 30.49 kg/m?  ?Physical Exam ?Vitals and nursing note reviewed.  ?Constitutional:   ?   Appearance: He is well-developed.  ?HENT:  ?   Head: Normocephalic and atraumatic.  ?   Comments: Moderate facial edema that is generalized in nature ?Cardiovascular:  ?   Rate and Rhythm: Normal rate and regular rhythm.  ?   Heart sounds: No murmur heard. ?Pulmonary:  ?   Effort: Pulmonary effort is normal. No respiratory distress.  ?   Breath sounds: Normal breath sounds.  ?Abdominal:  ?   Palpations: Abdomen is soft.  ?   Tenderness: There is no abdominal tenderness. There is no guarding or rebound.  ?   Comments: Pitting edema to the abdominal wall  ?Musculoskeletal:     ?   General: No tenderness.  ?   Comments: 4+ pitting  edema to bilateral lower extremities  ?Skin: ?   General: Skin is warm and dry.  ?Neurological:  ?   Mental Status: He is alert and oriented to person, place, and time.  ?Psychiatric:     ?   Behavior: Behavior normal.  ? ? ?ED Results / Procedures / Treatments   ?Labs ?(all labs ordered are listed, but only abnormal results are displayed) ?Labs Reviewed  ?CBC WITH DIFFERENTIAL/PLATELET - Abnormal; Notable for the following components:  ?    Result Value  ? WBC 11.8 (*)   ? RBC 3.44 (*)   ? Hemoglobin 9.9 (*)   ? HCT 28.4 (*)   ? Platelets 517 (*)   ? Eosinophils Absolute 0.7 (*)   ? Abs Immature Granulocytes 0.11 (*)   ? All other components within normal limits  ?COMPREHENSIVE METABOLIC PANEL - Abnormal; Notable for the following  components:  ? Chloride 117 (*)   ? CO2 21 (*)   ? BUN 26 (*)   ? Creatinine, Ser 2.21 (*)   ? Calcium 7.0 (*)   ? Total Protein 3.8 (*)   ? Albumin <1.5 (*)   ? GFR, Estimated 41 (*)   ? All other components within normal limits  ?URINALYSIS, ROUTINE W REFLEX MICROSCOPIC - Abnormal; Notable for the following components:  ? APPearance CLOUDY (*)   ? Hgb urine dipstick SMALL (*)   ? Protein, ur >=300 (*)   ? Bacteria, UA RARE (*)   ? All other components within normal limits  ?PHOSPHORUS - Abnormal; Notable for the following components:  ? Phosphorus 5.1 (*)   ? All other components within normal limits  ?MAGNESIUM  ?CBC  ?CREATININE, SERUM  ?COMPREHENSIVE METABOLIC PANEL  ?CBC  ? ? ?EKG ?None ? ?Radiology ?DG Chest Port 1 View ? ?Result Date: 11/29/2021 ?CLINICAL DATA:  Edema. EXAM: PORTABLE CHEST 1 VIEW COMPARISON:  Chest radiograph 10/25/2021, CT 08/20/2021 FINDINGS: Lung volumes are low mild cardiomegaly which may be accentuated by low volume technique. There are bilateral pleural effusions, similar on the left but slightly increased on the right. Minimal fluid in the right minor fissure. Slight vascular congestion. No pneumothorax. IMPRESSION: 1. Bilateral pleural effusions, similar on the left but slightly increased on the right. 2. Slight vascular congestion. Mild cardiomegaly may be accentuated by low volume technique. Electronically Signed   By: Keith Rake M.D.   On: 11/29/2021 23:46   ? ?Procedures ?Procedures  ? ? ?Medications Ordered in ED ?Medications  ?heparin injection 5,000 Units (has no administration in time range)  ?acetaminophen (TYLENOL) tablet 650 mg (has no administration in time range)  ?  Or  ?acetaminophen (TYLENOL) suppository 650 mg (has no administration in time range)  ?furosemide (LASIX) injection 80 mg (has no administration in time range)  ?furosemide (LASIX) injection 80 mg (80 mg Intravenous Given 11/30/21 0105)  ? ? ?ED Course/ Medical Decision Making/ A&P ?  ?                         ?Medical Decision Making ?Amount and/or Complexity of Data Reviewed ?Labs: ordered. ?Radiology: ordered. ? ?Risk ?Prescription drug management. ?Decision regarding hospitalization. ? ? ?Patient with history of nephrotic syndrome here for evaluation of progressive edema.  He has generalized edema on evaluation, no respiratory distress.  He does have decreased air movement on lung examination.  Chest x-ray with bilateral pleural effusions, images personally reviewed.  BMP with stable to mildly worsening renal function.  CBC with stable anemia.  He was treated with IV Lasix.  On record review patient has gained 21 kg since February of this year.  Feel with this massive fluid gain he will benefit from admission for further diuresis and nephrology evaluation.  Patient updated on findings of studies and recommendation for admission and he is in agreement with treatment plan.  Hospitalist consulted for admission. ? ? ? ? ? ? ?Final Clinical Impression(s) / ED Diagnoses ?Final diagnoses:  ?Anasarca associated with disorder of kidney  ? ? ?Rx / DC Orders ?ED Discharge Orders   ? ? None  ? ?  ? ? ?  ?Quintella Reichert, MD ?11/30/21 [phone] ? ?

## 2021-11-29 NOTE — ED Triage Notes (Signed)
Pt reports with facial swelling, abdominal swelling, and leg swelling x several days in relation to his nephrotic syndrome.  ?

## 2021-11-30 ENCOUNTER — Encounter (HOSPITAL_COMMUNITY): Payer: Self-pay | Admitting: Internal Medicine

## 2021-11-30 DIAGNOSIS — Z87441 Personal history of nephrotic syndrome: Secondary | ICD-10-CM | POA: Diagnosis not present

## 2021-11-30 DIAGNOSIS — N179 Acute kidney failure, unspecified: Secondary | ICD-10-CM | POA: Diagnosis present

## 2021-11-30 DIAGNOSIS — N1831 Chronic kidney disease, stage 3a: Secondary | ICD-10-CM | POA: Diagnosis present

## 2021-11-30 DIAGNOSIS — I129 Hypertensive chronic kidney disease with stage 1 through stage 4 chronic kidney disease, or unspecified chronic kidney disease: Secondary | ICD-10-CM | POA: Diagnosis present

## 2021-11-30 DIAGNOSIS — E8770 Fluid overload, unspecified: Secondary | ICD-10-CM | POA: Diagnosis present

## 2021-11-30 DIAGNOSIS — N049 Nephrotic syndrome with unspecified morphologic changes: Secondary | ICD-10-CM

## 2021-11-30 DIAGNOSIS — Z91018 Allergy to other foods: Secondary | ICD-10-CM | POA: Diagnosis not present

## 2021-11-30 DIAGNOSIS — J90 Pleural effusion, not elsewhere classified: Secondary | ICD-10-CM | POA: Diagnosis present

## 2021-11-30 DIAGNOSIS — N058 Unspecified nephritic syndrome with other morphologic changes: Secondary | ICD-10-CM | POA: Diagnosis not present

## 2021-11-30 DIAGNOSIS — E785 Hyperlipidemia, unspecified: Secondary | ICD-10-CM | POA: Diagnosis present

## 2021-11-30 DIAGNOSIS — F17210 Nicotine dependence, cigarettes, uncomplicated: Secondary | ICD-10-CM | POA: Diagnosis present

## 2021-11-30 DIAGNOSIS — K922 Gastrointestinal hemorrhage, unspecified: Secondary | ICD-10-CM | POA: Diagnosis present

## 2021-11-30 DIAGNOSIS — Z91148 Patient's other noncompliance with medication regimen for other reason: Secondary | ICD-10-CM | POA: Diagnosis not present

## 2021-11-30 DIAGNOSIS — Z79899 Other long term (current) drug therapy: Secondary | ICD-10-CM | POA: Diagnosis not present

## 2021-11-30 DIAGNOSIS — R601 Generalized edema: Secondary | ICD-10-CM | POA: Diagnosis present

## 2021-11-30 DIAGNOSIS — D631 Anemia in chronic kidney disease: Secondary | ICD-10-CM | POA: Diagnosis present

## 2021-11-30 DIAGNOSIS — D649 Anemia, unspecified: Secondary | ICD-10-CM

## 2021-11-30 LAB — URINALYSIS, ROUTINE W REFLEX MICROSCOPIC
Bilirubin Urine: NEGATIVE
Glucose, UA: NEGATIVE mg/dL
Ketones, ur: NEGATIVE mg/dL
Leukocytes,Ua: NEGATIVE
Nitrite: NEGATIVE
Protein, ur: >=300 mg/dL — AB
Specific Gravity, Urine: 1.028 (ref 1.005–1.030)
pH: 5 (ref 5.0–8.0)

## 2021-11-30 LAB — CBC
HCT: 25.4 % — ABNORMAL LOW (ref 39.0–52.0)
HCT: 29.6 % — ABNORMAL LOW (ref 39.0–52.0)
Hemoglobin: 8.7 g/dL — ABNORMAL LOW (ref 13.0–17.0)
Hemoglobin: 10.1 g/dL — ABNORMAL LOW (ref 13.0–17.0)
MCH: 28.2 pg (ref 26.0–34.0)
MCH: 28.2 pg (ref 26.0–34.0)
MCHC: 34.3 g/dL (ref 30.0–36.0)
MCHC: 34.1 g/dL (ref 30.0–36.0)
MCV: 82.5 fL (ref 80.0–100.0)
MCV: 82.7 fL (ref 80.0–100.0)
Platelets: 442 10*3/uL — ABNORMAL HIGH (ref 150–400)
Platelets: 533 10*3/uL — ABNORMAL HIGH (ref 150–400)
RBC: 3.08 MIL/uL — ABNORMAL LOW (ref 4.22–5.81)
RBC: 3.58 MIL/uL — ABNORMAL LOW (ref 4.22–5.81)
RDW: 13.2 % (ref 11.5–15.5)
RDW: 13.2 % (ref 11.5–15.5)
WBC: 10.9 10*3/uL — ABNORMAL HIGH (ref 4.0–10.5)
WBC: 13 10*3/uL — ABNORMAL HIGH (ref 4.0–10.5)
nRBC: 0 % (ref 0.0–0.2)
nRBC: 0 % (ref 0.0–0.2)

## 2021-11-30 LAB — COMPREHENSIVE METABOLIC PANEL
ALT: 7 U/L (ref 0–44)
AST: 13 U/L — ABNORMAL LOW (ref 15–41)
Albumin: <1.5 g/dL — ABNORMAL LOW (ref 3.5–5.0)
Alkaline Phosphatase: 65 U/L (ref 38–126)
Anion gap: <3 — ABNORMAL LOW (ref 5–15)
BUN: 27 mg/dL — ABNORMAL HIGH (ref 6–20)
CO2: 23 mmol/L (ref 22–32)
Calcium: 7.1 mg/dL — ABNORMAL LOW (ref 8.9–10.3)
Chloride: 114 mmol/L — ABNORMAL HIGH (ref 98–111)
Creatinine, Ser: 2.05 mg/dL — ABNORMAL HIGH (ref 0.61–1.24)
GFR, Estimated: 45 mL/min — ABNORMAL LOW (ref >60)
Glucose, Bld: 86 mg/dL (ref 70–99)
Potassium: 3.7 mmol/L (ref 3.5–5.1)
Sodium: 138 mmol/L (ref 135–145)
Total Bilirubin: 0.4 mg/dL (ref 0.3–1.2)
Total Protein: 3.9 g/dL — ABNORMAL LOW (ref 6.5–8.1)

## 2021-11-30 LAB — CREATININE, SERUM
Creatinine, Ser: 2.34 mg/dL — ABNORMAL HIGH (ref 0.61–1.24)
GFR, Estimated: 39 mL/min — ABNORMAL LOW (ref >60)

## 2021-11-30 MED ORDER — ASPIRIN 81 MG PO CHEW
81.0000 mg | CHEWABLE_TABLET | Freq: Every day | ORAL | Status: DC
  Administered 2021-11-30 – 2021-12-06 (×7): 81 mg via ORAL
  Filled 2021-11-30 (×7): qty 1

## 2021-11-30 MED ORDER — HEPARIN SODIUM (PORCINE) 5000 UNIT/ML IJ SOLN
5000.0000 [IU] | Freq: Three times a day (TID) | INTRAMUSCULAR | Status: DC
  Administered 2021-11-30 – 2021-12-02 (×8): 5000 [IU] via SUBCUTANEOUS
  Filled 2021-11-30 (×10): qty 1

## 2021-11-30 MED ORDER — FUROSEMIDE 10 MG/ML IJ SOLN
80.0000 mg | Freq: Once | INTRAMUSCULAR | Status: AC
  Administered 2021-11-30: 80 mg via INTRAVENOUS
  Filled 2021-11-30: qty 8

## 2021-11-30 MED ORDER — LOSARTAN POTASSIUM 50 MG PO TABS
25.0000 mg | ORAL_TABLET | Freq: Every day | ORAL | Status: DC
  Administered 2021-11-30 – 2021-12-06 (×7): 25 mg via ORAL
  Filled 2021-11-30 (×7): qty 1

## 2021-11-30 MED ORDER — LABETALOL HCL 5 MG/ML IV SOLN
5.0000 mg | INTRAVENOUS | Status: DC | PRN
  Administered 2021-12-03: 5 mg via INTRAVENOUS
  Filled 2021-11-30: qty 4

## 2021-11-30 MED ORDER — FUROSEMIDE 10 MG/ML IJ SOLN
80.0000 mg | Freq: Two times a day (BID) | INTRAMUSCULAR | Status: DC
  Administered 2021-11-30 – 2021-12-01 (×3): 80 mg via INTRAVENOUS
  Filled 2021-11-30 (×3): qty 8

## 2021-11-30 MED ORDER — ACETAMINOPHEN 650 MG RE SUPP: 650.0000 mg | Freq: Four times a day (QID) | RECTAL | Status: DC | PRN

## 2021-11-30 MED ORDER — LABETALOL HCL 5 MG/ML IV SOLN
5.0000 mg | INTRAVENOUS | Status: DC | PRN
  Administered 2021-11-30 (×2): 5 mg via INTRAVENOUS
  Filled 2021-11-30 (×2): qty 4

## 2021-11-30 MED ORDER — ACETAMINOPHEN 325 MG PO TABS: 650.0000 mg | ORAL_TABLET | Freq: Four times a day (QID) | ORAL | Status: DC | PRN

## 2021-11-30 NOTE — Consult Note (Signed)
[facility]  ?INPATIENT CONSULTATION ? ?Reason for Consultation: anasarca, FSGS, CKD ?Requesting Provider: Dr. Wynelle Cleveland ? ?HPI: Philip Trevino is an 26 y.o. 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  ? ?Pt followed by Dr. Hollie Salk - Dx MCD 2020, initially responded to steroids then lost to f/u.  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   Admitted 07/2021 for empyema, AKI.  Ultimately required VATS for drainage and decortication of L effusion.  Completed antibiotics and was cleared for immunosuppression.  ? ?Saw Dr Hollie Salk 5/5 - wasn't taking losartan, unclear lasix dose but 160 BID recommended.  Rituxan had been recommended but he had missed clinic visits and hadn't come by office to sign financial assistance paperwork to secure med.  He finally signed at that visit and arrangements being made for med, but in the meantime he's presented for anasarca.   Started lasix 80 IV BID last PM - so far net neg 1.5L.  On low na diet and fluid restriction too. O2 sates 100% RA.  BPs 130-160s/100s.  ? ?Denies f/c/dyspnea.  Has mild dyspnea and orthopnea.  Stable urine character - foamy, no dysuria or difficulty voiding.  No NSAIDs.   ? ?PMH: ?Past Medical History:  ?Diagnosis Date  ? Nephrotic syndrome   ? ?PSH: ?Past Surgical History:  ?Procedure Laterality Date  ? IR FLUORO GUIDE CV LINE RIGHT  01/31/2020  ? IR US GUIDE VASC ACCESS RIGHT  01/31/2020  ? TONSILLECTOMY    ? VIDEO ASSISTED THORACOSCOPY (VATS)/EMPYEMA Left 08/20/2021  ? Procedure: VIDEO ASSISTED THORACOSCOPY (VATS)/EMPYEMA;  Surgeon: Dahlia Byes, MD;  Location: [place];  Service: Thoracic;  Laterality: Left;  ? ? ?Past Medical History:  ?Diagnosis Date  ? Nephrotic syndrome   ? ? ?Medications: ? I have reviewed the patient's current medications. ? ?(Not in a hospital admission) ? ? ?ALLERGIES: ?  ?Allergies  ?Allergen Reactions  ? Broccoli  [Brassica Oleracea] Hives  ?  CAULIFLOWER (allergic, also)  ? ? ?FAM HX: ?Family History  ?Problem Relation Age of Onset  ? Healthy Mother   ? Healthy Father   ? ? ?Social History:  ? reports that he has been smoking. He has never used smokeless tobacco. He reports that he does not currently use alcohol. He reports current drug use. Drug: Marijuana. ? ?ROS: 12 system ROS neg except per HPI ? ?Blood pressure (!) 141/105, pulse 73, temperature 98.3 ?F (36.8 ?C), temperature source Oral, resp. rate 14, height 5\' 10"  (1.778 m), weight 96.4 kg, SpO2 99 %. ?PHYSICAL EXAM: ?Gen: lying flat in bed looking tired but no distress  ?Eyes:  periorbital edema, EOMI ?ENT: MMM ?Neck: supple ?CV:  RRR, no rub ?Abd: soft, nontender, mildly distended, no fluid wave ?Lungs: dec BS bases, no wheezes ?GU: no foley ?Extr:  2+ pitting LE edema ?Neuro: nonfocal ?  ?Results for orders placed or performed during the hospital encounter of 11/29/21 (from the past 48 hour(s))  ?CBC with Differential     Status: Abnormal  ? Collection Time: 11/29/21 11:07 PM  ?Result Value Ref Range  ? WBC 11.8 (H) 4.0 - 10.5 K/uL  ? RBC 3.44 (L) 4.22 - 5.81 MIL/uL  ? Hemoglobin 9.9 (L) 13.0 - 17.0 g/dL  ? HCT 28.4 (L) 39.0 - 52.0 %  ? MCV 82.6 80.0 - 100.0 fL  ? MCH 28.8 26.0 - 34.0 pg  ? MCHC 34.9 30.0 -  36.0 g/dL  ? RDW 13.3 11.5 - 15.5 %  ? Platelets 517 (H) 150 - 400 K/uL  ? nRBC 0.0 0.0 - 0.2 %  ? Neutrophils Relative % 59 %  ? Neutro Abs 7.0 1.7 - 7.7 K/uL  ? Lymphocytes Relative 27 %  ? Lymphs Abs 3.2 0.7 - 4.0 K/uL  ? Monocytes Relative 6 %  ? Monocytes Absolute 0.7 0.1 - 1.0 K/uL  ? Eosinophils Relative 6 %  ? Eosinophils Absolute 0.7 (H) 0.0 - 0.5 K/uL  ? Basophils Relative 1 %  ? Basophils Absolute 0.1 0.0 - 0.1 K/uL  ? Immature Granulocytes 1 %  ? Abs Immature Granulocytes 0.11 (H) 0.00 - 0.07 K/uL  ?  Comment: Performed at [facility], [place] [address] [place], [place] [address]  ?Comprehensive metabolic panel     Status: Abnormal   ? Collection Time: 11/29/21 11:07 PM  ?Result Value Ref Range  ? Sodium 138 135 - 145 mmol/L  ? Potassium 4.2 3.5 - 5.1 mmol/L  ? Chloride 117 (H) 98 - 111 mmol/L  ? CO2 21 (L) 22 - 32 mmol/L  ? Glucose, Bld 98 70 - 99 mg/dL  ?  Comment: Glucose reference range applies only to samples taken after fasting for at least 8 hours.  ? BUN 26 (H) 6 - 20 mg/dL  ? Creatinine, Ser 2.21 (H) 0.61 - 1.24 mg/dL  ? Calcium 7.0 (L) 8.9 - 10.3 mg/dL  ? Total Protein 3.8 (L) 6.5 - 8.1 g/dL  ? Albumin <1.5 (L) 3.5 - 5.0 g/dL  ? AST 15 15 - 41 U/L  ? ALT 7 0 - 44 U/L  ? Alkaline Phosphatase 69 38 - 126 U/L  ? Total Bilirubin 0.4 0.3 - 1.2 mg/dL  ? GFR, Estimated 41 (L) >60 mL/min  ?  Comment: (NOTE) ?Calculated using the CKD-EPI Creatinine Equation (2021) ?Performed at [facility], [place] [address] ?[place], [place] [address] ?  ?Magnesium     Status: None  ? Collection Time: 11/29/21 11:21 PM  ?Result Value Ref Range  ? Magnesium 2.0 1.7 - 2.4 mg/dL  ?  Comment: Performed at [facility], [place] [address] [place], [place] [address]  ?Phosphorus     Status: Abnormal  ? Collection Time: 11/29/21 11:21 PM  ?Result Value Ref Range  ? Phosphorus 5.1 (H) 2.5 - 4.6 mg/dL  ?  Comment: Performed at [facility], [place] [address] [place], [place] [address]  ?Urinalysis, Routine w reflex microscopic Urine, Clean Catch     Status: Abnormal  ? Collection Time: 11/30/21 12:28 AM  ?Result Value Ref Range  ? Color, Urine YELLOW YELLOW  ? APPearance CLOUDY (A) CLEAR  ? Specific Gravity, Urine 1.028 1.005 - 1.030  ? pH 5.0 5.0 - 8.0  ? Glucose, UA NEGATIVE NEGATIVE mg/dL  ? Hgb urine dipstick SMALL (A) NEGATIVE  ? Bilirubin Urine NEGATIVE NEGATIVE  ? Ketones, ur NEGATIVE NEGATIVE mg/dL  ? Protein, ur >=300 (A) NEGATIVE mg/dL  ? Nitrite NEGATIVE NEGATIVE  ? Leukocytes,Ua NEGATIVE NEGATIVE  ? RBC / HPF 6-10 0 - 5 RBC/hpf  ? WBC, UA 21-50 0 - 5 WBC/hpf  ? Bacteria, UA RARE (A) NONE SEEN  ? Squamous  Epithelial / LPF 0-5 0 - 5  ? Mucus PRESENT   ? Hyaline Casts, UA PRESENT   ?  Comment: Performed at [facility], [place] [address] [place],  [address]  ?CBC  Status: Abnormal  ? Collection Time: 11/30/21  3:21 AM  ?Result Value Ref Range  ? WBC 10.9 (H) 4.0 - 10.5 K/uL  ? RBC 3.08 (L) 4.22 - 5.81 MIL/uL  ? Hemoglobin 8.7 (L) 13.0 - 17.0 g/dL  ? HCT 25.4 (L) 39.0 - 52.0 %  ? MCV 82.5 80.0 - 100.0 fL  ? MCH 28.2 26.0 - 34.0 pg  ? MCHC 34.3 30.0 - 36.0 g/dL  ? RDW 13.2 11.5 - 15.5 %  ? Platelets 442 (H) 150 - 400 K/uL  ? nRBC 0.0 0.0 - 0.2 %  ?  Comment: Performed at [facility] [place], [place] [address] [place], [place] [address]  ?Creatinine, serum     Status: Abnormal  ? Collection Time: 11/30/21  3:21 AM  ?Result Value Ref Range  ? Creatinine, Ser 2.34 (H) 0.61 - 1.24 mg/dL  ? GFR, Estimated 39 (L) >60 mL/min  ?  Comment: (NOTE) ?Calculated using the CKD-EPI Creatinine Equation (2021) ?Performed at [facility], [place] [address] ?[place], [place] [address] ?  ? ? ?DG Chest Port 1 View ? ?Result Date: 11/29/2021 ?CLINICAL DATA:  Edema. EXAM: PORTABLE CHEST 1 VIEW COMPARISON:  Chest radiograph 10/25/2021, CT 08/20/2021 FINDINGS: Lung volumes are low mild cardiomegaly which may be accentuated by low volume technique. There are bilateral pleural effusions, similar on the left but slightly increased on the right. Minimal fluid in the right minor fissure. Slight vascular congestion. No pneumothorax. IMPRESSION: 1. Bilateral pleural effusions, similar on the left but slightly increased on the right. 2. Slight vascular congestion. Mild cardiomegaly may be accentuated by low volume technique. Electronically Signed   By: Keith Rake M.D.   On: 11/29/2021 23:46   ? ?Assessment/Plan ?**Anasarca secondary to FSGS/nephrotic syndrome:  diuresis with lasix 80 IV BID for now.  If not diuresing will increase dose, could add IV alb too if needed.  Low na/fluid restricted  diet.  Rituxan being arranged outpt.  Resume losartan 25 daily.  ASA 81 daily DVT prophylaxis in setting of alb < 1.5.  I/os, daily weights.  ? 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  Baseline Cr in the 2mg /dL range but

## 2021-11-30 NOTE — Progress Notes (Signed)
?[facility] Progress Note ? ?Patient: Philip Trevino     ?HUT:[id number]  ?DOA: 11/29/2021   ?PCP: Pcp, No  ? ?  ?  ?Brief hospital course: ? 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 She was admitted in 07/2021 and discharged on 08/24/2021 for empyema and underwent a VATS procedure with bilateral chest tubes.  She was supposed to follow-up with Dr. Hollie Salk 2 weeks post discharge. ?On her postop visit with cardiothoracic surgery on 10/25/2021, it was felt that she was cleared from the pulmonary perspective to receive immunotherapy for nephrotic syndrome. ? ?In the ED: noted to have anasarca and pleural effusions. IV Lasix started.  ?Subjective:  ?Urinating well. Swelling slightly improved. ? 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 ?Active Problems: ? ?  Normocytic anemia ?-Secondary to renal disease-hemoglobin stable ?- Follow ?  ?DVT prophylaxis:  heparin injection 5,000 Units Start: 11/30/21 0600 ? ?  Code Status: Full Code  ?Consultants: nephrology ?Level of Care: Level of care: Progressive ?Disposition Plan:  ?Status is: Inpatient ?Remains inpatient appropriate because: AKI, anasarca ? ?Objective: ?  ?Vitals:  ? 11/30/21 1100 11/30/21 1200 11/30/21 1300 11/30/21 1400  ?BP: (!) 139/106 (!) 141/105 (!) 160/122 (!) 158/114  ?Pulse: 69 73 64 77  ?Resp: 16 14 18 16   ?Temp:      ?TempSrc:      ?SpO2: 100% 99% 100% 100%  ?Weight:      ?Height:      ? ?Filed Weights  ? 11/29/21 2300  ?Weight: 96.4 kg  ? ?Exam: ?General exam: Appears comfortable  ?HEENT: PERRLA, oral mucosa moist, no sclera  icterus or thrush- severe facial and periorbital edema ?Respiratory system: Clear to auscultation. Respiratory effort normal. ?Cardiovascular system: S1 & S2 heard, regular rate and rhythm ?Gastrointestinal system: Abdomen soft, non-tender, mildly distended. Normal bowel sounds   ?Central nervous system: Alert and oriented. No focal neurological deficits. ?Extremities: No cyanosis, clubbing - severe anasarca  ?Skin: No rashes or ulcers ?Psychiatry:  Mood & affect appropriate.   ? ?Imaging and lab data was personally reviewed ? ? ? CBC: ?Recent Labs  ?Lab 11/29/21 ?2307 11/30/21 ?0321  ?WBC 11.8* 10.9*  ?NEUTROABS 7.0  --   ?HGB 9.9* 8.7*  ?HCT 28.4* 25.4*  ?MCV 82.6 82.5  ?PLT 517* 442*  ? ?Basic Metabolic Panel: ?Recent Labs  ?Lab 11/29/21 ?2307 11/29/21 ?2321 11/30/21 ?0321  ?NA 138  --   --   ?K 4.2  --   --   ?CL 117*  --   --   ?CO2 21*  --   --   ?GLUCOSE 98  --   --   ?BUN 26*  --   --   ?CREATININE 2.21*  --  2.34*  ?CALCIUM 7.0*  --   --   ?MG  --  2.0  --   ?PHOS  --  5.1*  --   ? ?GFR: ?Estimated Creatinine Clearance: 56.2 mL/min (A) (by C-G formula based on SCr of 2.34 mg/dL (H)). ? ?Scheduled Meds: ? aspirin  81 mg Oral Daily  ? furosemide  80 mg Intravenous Q12H  ? heparin  5,000 Units Subcutaneous Q8H  ? losartan  25 mg Oral Daily  ? ?Continuous Infusions: ? ? LOS: 0 days  ? ?Author: ?Debbe Odea  ?11/30/2021 2:49 PM ?   ?

## 2021-11-30 NOTE — ED Notes (Signed)
Decreased periorbital swelling noted ?

## 2021-11-30 NOTE — H&P (Signed)
?History and Physical  ? ? Philip Trevino JIR:[id number] DOB: [birth year] DOA: 11/29/2021 ? ?PCP: Pcp, No  ?Patient coming from: Home. ? ?Chief Complaint: Fluid overload. ? ?HPI: Philip Trevino is a 26 y.o. male with history of nephrotic syndrome recently admitted in January 2023 for empyema and underwent VATS subsequent to which patient's prednisone was discontinued but still takes Lasix 80 mg twice daily had visited his nephrologist last week has come to the ER because of acute worsening of his edema now involving his face.  Has mild shortness of breath denies any difficulty eating or swallowing. ? ?ED Course: In the ER patient is found to have be having significant fluid overload with edema involving his face.  Chest x-ray shows pleural effusion.  Labs show creatinine to be around 2.2 hemoglobin to be around 9.9 which are all around the baseline.  Lasix 80 mg IV was given and admitted for further management. ? ?Review of Systems: As per HPI, rest all negative. ? ? ?Past Medical History:  ?Diagnosis Date  ? Nephrotic syndrome   ? ? ?Past Surgical History:  ?Procedure Laterality Date  ? IR FLUORO GUIDE CV LINE RIGHT  01/31/2020  ? IR US GUIDE VASC ACCESS RIGHT  01/31/2020  ? TONSILLECTOMY    ? VIDEO ASSISTED THORACOSCOPY (VATS)/EMPYEMA Left 08/20/2021  ? Procedure: VIDEO ASSISTED THORACOSCOPY (VATS)/EMPYEMA;  Surgeon: Dahlia Byes, MD;  Location: [facility];  Service: Thoracic;  Laterality: Left;  ? ? ? reports that he has been smoking. He has never used smokeless tobacco. He reports that he does not currently use alcohol. He reports current drug use. Drug: Marijuana. ? ?Allergies  ?Allergen Reactions  ? Broccoli [Brassica Oleracea] Hives  ?  CAULIFLOWER (allergic, also)  ? ? ?Family History  ?Problem Relation Age of Onset  ? Healthy Mother   ? Healthy Father   ? ? ?Prior to Admission medications   ?Medication Sig Start Date End Date Taking? Authorizing Provider  ?acetaminophen (TYLENOL) 500 MG tablet Take 2 tablets (1,000  mg total) by mouth every 6 (six) hours. ?Patient not taking: Reported on 11/30/2021 08/25/21   Phillips Grout, MD  ?atorvastatin (LIPITOR) 80 MG tablet Take 1 tablet (80 mg total) by mouth daily. ?Patient not taking: Reported on 10/25/2021 10/04/20   Dessa Phi, DO  ?ezetimibe (ZETIA) 10 MG tablet Take 1 tablet (10 mg total) by mouth daily. ?Patient not taking: Reported on 10/25/2021 10/04/20   Dessa Phi, DO  ?furosemide (LASIX) 80 MG tablet TAKE 1 TABLET (80 MG TOTAL) BY MOUTH 2 (TWO) TIMES DAILY. ?Patient not taking: Reported on 11/30/2021 10/03/20 10/25/21  Dessa Phi, DO  ?gabapentin (NEURONTIN) 250 MG/5ML solution Take 6 mLs (300 mg total) by mouth at bedtime. ?Patient not taking: Reported on 10/25/2021 09/10/21   Dahlia Byes, MD  ? ? ?Physical Exam: ?Constitutional: Moderately built and nourished. ?Vitals:  ? 11/29/21 2300 11/30/21 0000  ?BP: (!) 164/123 (!) 150/116  ?Pulse: 90 89  ?Resp: 16 (!) 23  ?Temp: 98.3 ?F (36.8 ?C)   ?TempSrc: Oral   ?SpO2: 100% 100%  ?Weight: 96.4 kg   ?Height: 5\' 10"  (1.778 m)   ? ?Eyes: Anicteric no pallor. ?ENMT: No discharge from the ears eyes nose and mouth.  Facial puffiness. ?Neck: No mass felt.  No neck rigidity. ?Respiratory: No rhonchi or crepitations. ?Cardiovascular: S1-S2 heard. ?Abdomen: Distended nontender. ?Musculoskeletal: 3+ edema up to the thighs. ?Skin: No rash. ?Neurologic: Alert awake oriented time place and person.  Moves all extremities. ?Psychiatric: Appears normal.  Normal affect. ? ? ?Labs on Admission: I have personally reviewed following labs and imaging studies ? ?CBC: ?Recent Labs  ?Lab 11/29/21 ?2307  ?WBC 11.8*  ?NEUTROABS 7.0  ?HGB 9.9*  ?HCT 28.4*  ?MCV 82.6  ?PLT 517*  ? ?Basic Metabolic Panel: ?Recent Labs  ?Lab 11/29/21 ?2307 11/29/21 ?2321  ?NA 138  --   ?K 4.2  --   ?CL 117*  --   ?CO2 21*  --   ?GLUCOSE 98  --   ?BUN 26*  --   ?CREATININE 2.21*  --   ?CALCIUM 7.0*  --   ?MG  --  2.0  ?PHOS  --  5.1*  ? ?GFR: ?Estimated Creatinine Clearance:  59.6 mL/min (A) (by C-G formula based on SCr of 2.21 mg/dL (H)). ?Liver Function Tests: ?Recent Labs  ?Lab 11/29/21 ?2307  ?AST 15  ?ALT 7  ?ALKPHOS 69  ?BILITOT 0.4  ?PROT 3.8*  ?ALBUMIN <1.5*  ? ?No results for input(s): LIPASE, AMYLASE in the last 168 hours. ?No results for input(s): AMMONIA in the last 168 hours. ?Coagulation Profile: ?No results for input(s): INR, PROTIME in the last 168 hours. ?Cardiac Enzymes: ?No results for input(s): CKTOTAL, CKMB, CKMBINDEX, TROPONINI in the last 168 hours. ?BNP (last 3 results) ?No results for input(s): PROBNP in the last 8760 hours. ?HbA1C: ?No results for input(s): HGBA1C in the last 72 hours. ?CBG: ?No results for input(s): GLUCAP in the last 168 hours. ?Lipid Profile: ?No results for input(s): CHOL, HDL, LDLCALC, TRIG, CHOLHDL, LDLDIRECT in the last 72 hours. ?Thyroid Function Tests: ?No results for input(s): TSH, T4TOTAL, FREET4, T3FREE, THYROIDAB in the last 72 hours. ?Anemia Panel: ?No results for input(s): VITAMINB12, FOLATE, FERRITIN, TIBC, IRON, RETICCTPCT in the last 72 hours. ?Urine analysis: ?   ?Component Value Date/Time  ? [place] YELLOW 11/30/2021 0028  ? APPEARANCEUR CLOUDY (A) 11/30/2021 0028  ? LABSPEC 1.028 11/30/2021 0028  ? PHURINE 5.0 11/30/2021 0028  ? [place] NEGATIVE 11/30/2021 0028  ? HGBUR SMALL (A) 11/30/2021 0028  ? [place] NEGATIVE 11/30/2021 0028  ? [place] NEGATIVE 11/30/2021 0028  ? PROTEINUR >=300 (A) 11/30/2021 0028  ? UROBILINOGEN 1.0 03/02/2019 1944  ? NITRITE NEGATIVE 11/30/2021 0028  ? LEUKOCYTESUR NEGATIVE 11/30/2021 0028  ? ?Sepsis Labs: ?@LABRCNTIP (procalcitonin:4,lacticidven:4) ?)No results found for this or any previous visit (from the past 240 hour(s)).  ? ?Radiological Exams on Admission: ?DG Chest Port 1 View ? ?Result Date: 11/29/2021 ?CLINICAL DATA:  Edema. EXAM: PORTABLE CHEST 1 VIEW COMPARISON:  Chest radiograph 10/25/2021, CT 08/20/2021 FINDINGS: Lung volumes are low mild cardiomegaly which may be accentuated  by low volume technique. There are bilateral pleural effusions, similar on the left but slightly increased on the right. Minimal fluid in the right minor fissure. Slight vascular congestion. No pneumothorax. IMPRESSION: 1. Bilateral pleural effusions, similar on the left but slightly increased on the right. 2. Slight vascular congestion. Mild cardiomegaly may be accentuated by low volume technique. Electronically Signed   By: Keith Rake M.D.   On: 11/29/2021 23:46   ? ?EKG: Independently reviewed.  Normal sinus rhythm. ? ?Assessment/Plan ?Principal Problem: ?  Anasarca ?Active Problems: ?  Anasarca associated with disorder of kidney ?  ? ?Anasarca with history of nephrotic syndrome we will keep patient on Lasix 80 mg IV every 12 we will consult nephrology in the morning.  Closely follow intake output and daily weights. ?Anemia likely from renal disease follow CBC.  We will check Dopplers to rule out DVT. ?Elevated blood pressure readings  closely follow blood pressure trends presently on IV Lasix we will keep patient on as needed labetalol. ?Recently admitted for empyema and underwent VATS.  Denies any fever chills or chest pain.  Does have bilateral pleural effusion likely from anasarca secondary to nephrotic syndrome. ?Hyperlipidemia not taking his statins. ? ?Since patient has anasarca with nephrotic syndrome and need close monitoring and inpatient status. ? ? ?DVT prophylaxis: Heparin. ?Code Status: Full code. ?Family Communication: Discussed with patient. ?Disposition Plan: Home. ?Consults called: Will need to consult nephrologist. ?Admission status: Inpatient. ? ? ?Rise Patience MD ?[facility] ?Pager [phone]. ? ?If 7PM-7AM, please contact night-coverage ?[email or web address] ?Password [email or web address] ? ?11/30/2021, 3:23 AM  ? ? ? ?

## 2021-12-01 DIAGNOSIS — N049 Nephrotic syndrome with unspecified morphologic changes: Secondary | ICD-10-CM | POA: Diagnosis not present

## 2021-12-01 LAB — BASIC METABOLIC PANEL
Anion gap: 3 — ABNORMAL LOW (ref 5–15)
BUN: 27 mg/dL — ABNORMAL HIGH (ref 6–20)
CO2: 23 mmol/L (ref 22–32)
Calcium: 6.8 mg/dL — ABNORMAL LOW (ref 8.9–10.3)
Chloride: 112 mmol/L — ABNORMAL HIGH (ref 98–111)
Creatinine, Ser: 1.97 mg/dL — ABNORMAL HIGH (ref 0.61–1.24)
GFR, Estimated: 47 mL/min — ABNORMAL LOW (ref >60)
Glucose, Bld: 88 mg/dL (ref 70–99)
Potassium: 3.6 mmol/L (ref 3.5–5.1)
Sodium: 138 mmol/L (ref 135–145)

## 2021-12-01 LAB — CBC
HCT: 24.4 % — ABNORMAL LOW (ref 39.0–52.0)
Hemoglobin: 8.5 g/dL — ABNORMAL LOW (ref 13.0–17.0)
MCH: 28.6 pg (ref 26.0–34.0)
MCHC: 34.8 g/dL (ref 30.0–36.0)
MCV: 82.2 fL (ref 80.0–100.0)
Platelets: 431 10*3/uL — ABNORMAL HIGH (ref 150–400)
RBC: 2.97 MIL/uL — ABNORMAL LOW (ref 4.22–5.81)
RDW: 13.2 % (ref 11.5–15.5)
WBC: 11.9 10*3/uL — ABNORMAL HIGH (ref 4.0–10.5)
nRBC: 0 % (ref 0.0–0.2)

## 2021-12-01 MED ORDER — FUROSEMIDE 10 MG/ML IJ SOLN
80.0000 mg | Freq: Three times a day (TID) | INTRAMUSCULAR | Status: DC
  Administered 2021-12-01 – 2021-12-02 (×3): 80 mg via INTRAVENOUS
  Filled 2021-12-01 (×3): qty 8

## 2021-12-01 NOTE — Progress Notes (Addendum)
?[facility] ?Progress Note  ? ?Subjective:   Feels ok - no new symptoms/issues.  Diuresing, no LUTs.  I/Os yest 0.3 / 2.8L.  Cr 2 today, stable.  ? ?Objective ?Vitals:  ? 12/01/21 0159 12/01/21 0500 12/01/21 0555 12/01/21 0751  ?BP: (!) 140/98  (!) 124/94   ?Pulse: 90  84   ?Resp:      ?Temp: 99.4 ?F (37.4 ?C)  98.2 ?F (36.8 ?C)   ?TempSrc: Oral  Oral   ?SpO2: 100%  99%   ?Weight:  96.3 kg  96.1 kg  ?Height:      ? ?Physical Exam ?General: lying flat in bed, no distress ?Heart: RRR ?Lungs: clear ?Abdomen: soft ?Extremities: 2+ edema still ? ?Additional Objective ?Labs: ?Basic Metabolic Panel: ?Recent Labs  ?Lab 11/29/21 ?2307 11/29/21 ?2321 11/30/21 ?0321 11/30/21 ?1646 12/01/21 ?0932  ?NA 138  --   --  138 138  ?K 4.2  --   --  3.7 3.6  ?CL 117*  --   --  114* 112*  ?CO2 21*  --   --  23 23  ?GLUCOSE 98  --   --  86 88  ?BUN 26*  --   --  27* 27*  ?CREATININE 2.21*  --  2.34* 2.05* 1.97*  ?CALCIUM 7.0*  --   --  7.1* 6.8*  ?PHOS  --  5.1*  --   --   --   ? ?Liver Function Tests: ?Recent Labs  ?Lab 11/29/21 ?2307 11/30/21 ?1646  ?AST 15 13*  ?ALT 7 7  ?ALKPHOS 69 65  ?BILITOT 0.4 0.4  ?PROT 3.8* 3.9*  ?ALBUMIN <1.5* <1.5*  ? ?No results for input(s): LIPASE, AMYLASE in the last 168 hours. ?CBC: ?Recent Labs  ?Lab 11/29/21 ?2307 11/30/21 ?0321 11/30/21 ?1646 12/01/21 ?3557  ?WBC 11.8* 10.9* 13.0* 11.9*  ?NEUTROABS 7.0  --   --   --   ?HGB 9.9* 8.7* 10.1* 8.5*  ?HCT 28.4* 25.4* 29.6* 24.4*  ?MCV 82.6 82.5 82.7 82.2  ?PLT 517* 442* 533* 431*  ? ?Blood Culture ?   ?Component Value Date/Time  ? SDES PLEURAL 08/20/2021 1102  ? [place] NONE 08/20/2021 1102  ? CULT  08/20/2021 1102  ?  No growth aerobically or anaerobically. ?Performed at [facility], [place] [address] [place], [place] [address] ?  ? REPTSTATUS 08/25/2021 FINAL 08/20/2021 1102  ? ? ?Cardiac Enzymes: ?No results for input(s): CKTOTAL, CKMB, CKMBINDEX, TROPONINI in the last 168 hours. ?CBG: ?No results for input(s): GLUCAP in the  last 168 hours. ?Iron Studies: No results for input(s): IRON, TIBC, TRANSFERRIN, FERRITIN in the last 72 hours. ?@lablastinr3 @ ?Studies/Results: ?DG Chest Port 1 View ? ?Result Date: 11/29/2021 ?CLINICAL DATA:  Edema. EXAM: PORTABLE CHEST 1 VIEW COMPARISON:  Chest radiograph 10/25/2021, CT 08/20/2021 FINDINGS: Lung volumes are low mild cardiomegaly which may be accentuated by low volume technique. There are bilateral pleural effusions, similar on the left but slightly increased on the right. Minimal fluid in the right minor fissure. Slight vascular congestion. No pneumothorax. IMPRESSION: 1. Bilateral pleural effusions, similar on the left but slightly increased on the right. 2. Slight vascular congestion. Mild cardiomegaly may be accentuated by low volume technique. Electronically Signed   By: Keith Rake M.D.   On: 11/29/2021 23:46   ?Medications: ? ? aspirin  81 mg Oral Daily  ? furosemide  80 mg Intravenous Q12H  ? heparin  5,000 Units Subcutaneous Q8H  ? losartan  25 mg Oral Daily  ? ? ?  Assessment/Plan:  Philip Trevino is an 26 y.o. 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  ? ?**Anasarca secondary to FSGS/nephrotic syndrome:  diuresis with lasix 80 IV and increase to TID for now.   Low na/fluid restricted diet.  Rituxan being arranged outpt.  Resume losartan 25 daily.  ASA 81 daily DVT prophylaxis in setting of alb < 1.5 + SQ heparin.  I/os, daily weights.  Low na diet, fluid restrict.  Reiterated importance of low na.  ?  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  Baseline Cr in the 2mg /dL range but has had h/o significant AKIs.  Cr stable around 2 currently. Continue to monitor with daily labs in setting of obligatory diuresis.  Rituxan planned per above.  Resumed ARB.   ?  ?**HTN:  resume losartan + diuresis.  BP normal this AM. ?  ?**Anemia, normocytic:  stable in the 8-9 range consistent with past 5 months.  CTM.  ?  ?Will  follow, call with questions.  ? ?Jannifer Hick MD ?12/01/2021, 10:02 AM  ?[facility] ?Pager: [phone] ? ? ?

## 2021-12-01 NOTE — Progress Notes (Signed)
?[facility] Progress Note ? ?Patient: Philip Trevino     ?HYQ:[id number]  ?DOA: 11/29/2021   ?PCP: Pcp, No  ? ?  ?  ?Brief hospital course: ? 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 She was admitted in 07/2021 and discharged on 08/24/2021 for empyema and underwent a VATS procedure with bilateral chest tubes.  She was supposed to follow-up with Dr. Hollie Salk 2 weeks post discharge. ?On her postop visit with cardiothoracic surgery on 10/25/2021, it was felt that she was cleared from the pulmonary perspective to receive immunotherapy for nephrotic syndrome. ? ?In the ED: noted to have anasarca and pleural effusions. IV Lasix started.  ? ?Subjective:  ?Continues to urinate well but feels that swelling is not improving.  ? 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  ?Active Problems: ? ?  Normocytic anemia ?-Secondary to renal disease-hemoglobin stable ?- Follow ?  ?DVT prophylaxis:  heparin injection 5,000 Units Start: 11/30/21 0600 ? ?  Code Status: Full Code  ?Consultants: nephrology ?Level of Care: Level of care: Telemetry ?Disposition Plan:  ?Status is: Inpatient ?Remains inpatient appropriate because: AKI, anasarca ? ?Objective: ?  ?Vitals:  ? 12/01/21 0500 12/01/21 0555 12/01/21 0751 12/01/21 1422  ?BP:  (!) 124/94  (!) 147/109  ?Pulse:  84  70  ?Resp:    18  ?Temp:  98.2 ?F (36.8 ?C)  98.3 ?F (36.8 ?C)  ?TempSrc:  Oral  Oral  ?SpO2:  99%  98%  ?Weight: 96.3 kg  96.1 kg   ?Height:      ? ?Filed Weights  ? 11/29/21 2300 12/01/21 0500 12/01/21 0751  ?Weight: 96.4 kg 96.3 kg  96.1 kg  ? ?Exam: ?General exam: Appears comfortable  ?HEENT: PERRLA, oral mucosa moist, no sclera icterus or thrush- facial edema and periorbital edema noted ?Respiratory system: Clear to auscultation. Respiratory effort normal. ?Cardiovascular system: S1 & S2 heard, regular rate and rhythm ?Gastrointestinal system: Abdomen soft, non-tender,  moderately distended. Normal bowel sounds   ?Central nervous system: Alert and oriented. No focal neurological deficits. ?Extremities: No cyanosis, clubbing -severe anasarca ?Skin: No rashes or ulcers ?Psychiatry:  Mood & affect appropriate.   ? ?Imaging and lab data was personally reviewed ? ? ? CBC: ?Recent Labs  ?Lab 11/29/21 ?2307 11/30/21 ?0321 11/30/21 ?1646 12/01/21 ?9528  ?WBC 11.8* 10.9* 13.0* 11.9*  ?NEUTROABS 7.0  --   --   --   ?HGB 9.9* 8.7* 10.1* 8.5*  ?HCT 28.4* 25.4* 29.6* 24.4*  ?MCV 82.6 82.5 82.7 82.2  ?PLT 517* 442* 533* 431*  ? ? ?Basic Metabolic Panel: ?Recent Labs  ?Lab 11/29/21 ?2307 11/29/21 ?2321 11/30/21 ?0321 11/30/21 ?1646 12/01/21 ?4132  ?NA 138  --   --  138 138  ?K 4.2  --   --  3.7 3.6  ?CL 117*  --   --  114* 112*  ?CO2 21*  --   --  23 23  ?GLUCOSE 98  --   --  86 88  ?BUN 26*  --   --  27* 27*  ?CREATININE 2.21*  --  2.34* 2.05* 1.97*  ?  CALCIUM 7.0*  --   --  7.1* 6.8*  ?MG  --  2.0  --   --   --   ?PHOS  --  5.1*  --   --   --   ? ? ?GFR: ?Estimated Creatinine Clearance: 66.6 mL/min (A) (by C-G formula based on SCr of 1.97 mg/dL (H)). ? ?Scheduled Meds: ? aspirin  81 mg Oral Daily  ? furosemide  80 mg Intravenous Q8H  ? heparin  5,000 Units Subcutaneous Q8H  ? losartan  25 mg Oral Daily  ? ?Continuous Infusions: ? ? LOS: 1 day  ? ?Author: ?Debbe Odea  ?12/01/2021 3:27 PM ?   ?

## 2021-12-02 DIAGNOSIS — N049 Nephrotic syndrome with unspecified morphologic changes: Secondary | ICD-10-CM | POA: Diagnosis not present

## 2021-12-02 LAB — RENAL FUNCTION PANEL
Albumin: <1.5 g/dL — ABNORMAL LOW (ref 3.5–5.0)
Anion gap: 4 — ABNORMAL LOW (ref 5–15)
BUN: 27 mg/dL — ABNORMAL HIGH (ref 6–20)
CO2: 25 mmol/L (ref 22–32)
Calcium: 7.3 mg/dL — ABNORMAL LOW (ref 8.9–10.3)
Chloride: 114 mmol/L — ABNORMAL HIGH (ref 98–111)
Creatinine, Ser: 1.84 mg/dL — ABNORMAL HIGH (ref 0.61–1.24)
GFR, Estimated: 52 mL/min — ABNORMAL LOW (ref >60)
Glucose, Bld: 92 mg/dL (ref 70–99)
Phosphorus: 4.8 mg/dL — ABNORMAL HIGH (ref 2.5–4.6)
Potassium: 3.9 mmol/L (ref 3.5–5.1)
Sodium: 143 mmol/L (ref 135–145)

## 2021-12-02 MED ORDER — FUROSEMIDE 10 MG/ML IJ SOLN
120.0000 mg | Freq: Three times a day (TID) | INTRAVENOUS | Status: DC
  Administered 2021-12-02 – 2021-12-06 (×12): 120 mg via INTRAVENOUS
  Filled 2021-12-02 (×2): qty 12
  Filled 2021-12-02: qty 4
  Filled 2021-12-02: qty 10
  Filled 2021-12-02: qty 12
  Filled 2021-12-02: qty 10
  Filled 2021-12-02: qty 4
  Filled 2021-12-02: qty 12
  Filled 2021-12-02: qty 4
  Filled 2021-12-02: qty 10
  Filled 2021-12-02 (×2): qty 12
  Filled 2021-12-02: qty 10
  Filled 2021-12-02: qty 12
  Filled 2021-12-02: qty 10
  Filled 2021-12-02: qty 12

## 2021-12-02 MED ORDER — FUROSEMIDE 10 MG/ML IJ SOLN
40.0000 mg | Freq: Once | INTRAMUSCULAR | Status: AC
  Administered 2021-12-02: 40 mg via INTRAVENOUS
  Filled 2021-12-02: qty 4

## 2021-12-02 NOTE — Progress Notes (Signed)
?[facility] Progress Note ? ?Patient: Philip Trevino     ?ZOX:[id number]  ?DOA: 11/29/2021   ?PCP: Pcp, No  ? ?  ?  ?Brief hospital course: ? 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 She was admitted in 07/2021 and discharged on 08/24/2021 for empyema and underwent a VATS procedure with bilateral chest tubes.  She was supposed to follow-up with Dr. Hollie Salk 2 weeks post discharge. ?On her postop visit with cardiothoracic surgery on 10/25/2021, it was felt that she was cleared from the pulmonary perspective to receive immunotherapy for nephrotic syndrome. ? ?In the ED: noted to have anasarca and pleural effusions. IV Lasix started.  ? ?Subjective:  ?Finally feels like he is improved and the fluid is coming off. Has no complaints.  ? 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  ?Active Problems: ? ?  Normocytic anemia ?-Secondary to renal disease-hemoglobin stable ?- Follow ?  ?DVT prophylaxis:  heparin injection 5,000 Units Start: 11/30/21 0600 ? ?  Code Status: Full Code  ?Consultants: nephrology ?Level of Care: Level of care: Telemetry ?Disposition Plan:  ?Status is: Inpatient ?Remains inpatient appropriate because: AKI, anasarca ? ?Objective: ?  ?Vitals:  ? 12/02/21 0500 12/02/21 0542 12/02/21 0843 12/02/21 1342  ?BP:  (!) 143/107 (!) 150/109 (!) 137/99  ?Pulse:  71 63 75  ?Resp:  18  20  ?Temp:  98.1 ?F (36.7 ?C)  98.3 ?F (36.8 ?C)  ?TempSrc:  Oral  Oral  ?SpO2:  100%  100%  ?Weight: 94.1 kg     ?Height:      ? ?Filed Weights  ? 12/01/21 0500 12/01/21 0751 12/02/21 0500   ?Weight: 96.3 kg 96.1 kg 94.1 kg  ? ?Exam: ?General exam: Appears comfortable  ?HEENT: PERRLA, oral mucosa moist, no sclera icterus or thrush ?Respiratory system: Clear to auscultation. Respiratory effort normal. ?Cardiovascular system: S1 & S2 heard, regular rate and rhythm ?Gastrointestinal system: Abdomen soft, non-tender, nondistended. Normal bowel sounds   ?Central nervous system: Alert and oriented. No focal neurological deficits. ?Extremities: No cyanosis, clubbing Anasarca is slowing improving ?Skin: No rashes or ulcers ?Psychiatry:  Mood & affect appropriate.   ? ?Imaging and lab data was personally reviewed ? ? ? CBC: ?Recent Labs  ?Lab 11/29/21 ?2307 11/30/21 ?0321 11/30/21 ?1646 12/01/21 ?8119  ?WBC 11.8* 10.9* 13.0* 11.9*  ?NEUTROABS 7.0  --   --   --   ?HGB 9.9* 8.7* 10.1* 8.5*  ?HCT 28.4* 25.4* 29.6* 24.4*  ?MCV 82.6 82.5 82.7 82.2  ?PLT 517* 442* 533* 431*  ? ? ?Basic Metabolic Panel: ?Recent Labs  ?Lab 11/29/21 ?2307 11/29/21 ?2321 11/30/21 ?0321 11/30/21 ?1646 12/01/21 ?1478 12/02/21 ?2956  ?NA 138  --   --  138 138 143  ?K 4.2  --   --  3.7 3.6 3.9  ?CL 117*  --   --  114* 112* 114*  ?CO2 21*  --   --  23 23 25   ?GLUCOSE 98  --   --  86 88 92  ?BUN 26*  --   --  27* 27* 27*  ?CREATININE 2.21*  --  2.34* 2.05* 1.97* 1.84*  ?CALCIUM 7.0*  --   --  7.1* 6.8* 7.3*  ?MG  --  2.0  --   --   --   --   ?PHOS  --  5.1*  --   --   --  4.8*  ? ? ?GFR: ?Estimated Creatinine Clearance: 70.7 mL/min (A) (by C-G formula based on SCr of 1.84 mg/dL (H)). ? ?Scheduled Meds: ? aspirin  81 mg Oral Daily  ? heparin  5,000 Units Subcutaneous Q8H  ? losartan  25 mg Oral Daily  ? ?Continuous Infusions: ? furosemide 120 mg (12/02/21 1318)  ? ? ? LOS: 2 days  ? ?Author: ?Debbe Odea  ?12/02/2021 2:36 PM ?   ?

## 2021-12-02 NOTE — Progress Notes (Signed)
?[facility] ?Progress Note  ? ?Subjective:   Feels ok - no new symptoms/issues.  Diuresing, no LUTs.  I/Os yest 0.7 / 1.5L.  Cr 1.8 today, stable/improved.  ? ?Objective ?Vitals:  ? 12/01/21 2113 12/02/21 0500 12/02/21 0542 12/02/21 0843  ?BP: (!) 154/103  (!) 143/107 (!) 150/109  ?Pulse: 63  71 63  ?Resp: 18  18   ?Temp: 98.2 ?F (36.8 ?C)  98.1 ?F (36.7 ?C)   ?TempSrc: Oral  Oral   ?SpO2: 100%  100%   ?Weight:  94.1 kg    ?Height:      ? ?Physical Exam ?General: lying flat in bed, no distress, periorbital edema persists ?Heart: RRR ?Lungs: clear ?Abdomen: soft ?Extremities: 2+ edema still ? ?Additional Objective ?Labs: ?Basic Metabolic Panel: ?Recent Labs  ?Lab 11/29/21 ?2321 11/30/21 ?0321 11/30/21 ?1646 12/01/21 ?1829 12/02/21 ?9371  ?NA  --   --  138 138 143  ?K  --   --  3.7 3.6 3.9  ?CL  --   --  114* 112* 114*  ?CO2  --   --  23 23 25   ?GLUCOSE  --   --  86 88 92  ?BUN  --   --  27* 27* 27*  ?CREATININE  --    < > 2.05* 1.97* 1.84*  ?CALCIUM  --   --  7.1* 6.8* 7.3*  ?PHOS 5.1*  --   --   --  4.8*  ? < > = values in this interval not displayed.  ? ? ?Liver Function Tests: ?Recent Labs  ?Lab 11/29/21 ?2307 11/30/21 ?1646 12/02/21 ?6967  ?AST 15 13*  --   ?ALT 7 7  --   ?ALKPHOS 69 65  --   ?BILITOT 0.4 0.4  --   ?PROT 3.8* 3.9*  --   ?ALBUMIN <1.5* <1.5* <1.5*  ? ? ?No results for input(s): LIPASE, AMYLASE in the last 168 hours. ?CBC: ?Recent Labs  ?Lab 11/29/21 ?2307 11/30/21 ?0321 11/30/21 ?1646 12/01/21 ?8938  ?WBC 11.8* 10.9* 13.0* 11.9*  ?NEUTROABS 7.0  --   --   --   ?HGB 9.9* 8.7* 10.1* 8.5*  ?HCT 28.4* 25.4* 29.6* 24.4*  ?MCV 82.6 82.5 82.7 82.2  ?PLT 517* 442* 533* 431*  ? ? ?Blood Culture ?   ?Component Value Date/Time  ? SDES PLEURAL 08/20/2021 1102  ? [place] NONE 08/20/2021 1102  ? CULT  08/20/2021 1102  ?  No growth aerobically or anaerobically. ?Performed at [facility], [place] [address] [place], [place] [address] ?  ? REPTSTATUS 08/25/2021 FINAL 08/20/2021 1102   ? ? ?Cardiac Enzymes: ?No results for input(s): CKTOTAL, CKMB, CKMBINDEX, TROPONINI in the last 168 hours. ?CBG: ?No results for input(s): GLUCAP in the last 168 hours. ?Iron Studies: No results for input(s): IRON, TIBC, TRANSFERRIN, FERRITIN in the last 72 hours. ?@lablastinr3 @ ?Studies/Results: ?No results found. ?Medications: ? furosemide    ? ? aspirin  81 mg Oral Daily  ? heparin  5,000 Units Subcutaneous Q8H  ? losartan  25 mg Oral Daily  ? ? ?Assessment/Plan:  Philip Trevino is an 26 y.o. 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  ? ?**Anasarca secondary to FSGS/nephrotic syndrome:  Has been diuresing with lasix 80 TID but still a ways to go - increase to 120 IV TID for now.   Low na/fluid restricted diet.  Rituxan being arranged outpt.  Resumed losartan  25 daily.  ASA 81 daily DVT prophylaxis in setting of alb < 1.5 + SQ heparin.  I/os, daily weights.  Low na diet, fluid restrict.  Reiterated importance of low na.  ?  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  Baseline Cr in the 2mg /dL range but has had h/o significant AKIs.  Cr stable around 2 currently, was 1.8 today. Continue to monitor with daily labs in setting of obligatory diuresis.  Rituxan planned per above.  Resumed ARB.   ?  ?**HTN:  resume losartan + diuresis.  BP normal this AM. ?  ?**Anemia, normocytic:  stable in the 8-9 range consistent with past 5 months.  CTM.  ?  ?Will follow, call with questions.  ? ?Jannifer Hick MD ?12/02/2021, 11:37 AM  ?[facility] ?Pager: [phone] ? ? ?

## 2021-12-03 DIAGNOSIS — N049 Nephrotic syndrome with unspecified morphologic changes: Secondary | ICD-10-CM | POA: Diagnosis not present

## 2021-12-03 LAB — RENAL FUNCTION PANEL
Albumin: <1.5 g/dL — ABNORMAL LOW (ref 3.5–5.0)
Anion gap: 5 (ref 5–15)
BUN: 25 mg/dL — ABNORMAL HIGH (ref 6–20)
CO2: 24 mmol/L (ref 22–32)
Calcium: 6.8 mg/dL — ABNORMAL LOW (ref 8.9–10.3)
Chloride: 109 mmol/L (ref 98–111)
Creatinine, Ser: 1.81 mg/dL — ABNORMAL HIGH (ref 0.61–1.24)
GFR, Estimated: 53 mL/min — ABNORMAL LOW (ref >60)
Glucose, Bld: 96 mg/dL (ref 70–99)
Phosphorus: 4.9 mg/dL — ABNORMAL HIGH (ref 2.5–4.6)
Potassium: 3.5 mmol/L (ref 3.5–5.1)
Sodium: 138 mmol/L (ref 135–145)

## 2021-12-03 MED ORDER — METOLAZONE 5 MG PO TABS
5.0000 mg | ORAL_TABLET | Freq: Two times a day (BID) | ORAL | Status: DC
Start: 2021-12-03 — End: 2021-12-06
  Administered 2021-12-03 – 2021-12-06 (×6): 5 mg via ORAL
  Filled 2021-12-03 (×7): qty 1

## 2021-12-03 NOTE — Progress Notes (Signed)
?[facility] ?Progress Note  ? ?Subjective:   Feels ok, swelling a little better each day (face, and arms). I/O 800 cc yest nad 2 L so far today.  ? ?Objective ?Vitals:  ? 12/03/21 0413 12/03/21 0417 12/03/21 0855 12/03/21 1316  ?BP: (!) 147/111  (!) 135/101 (!) 142/109  ?Pulse:   71 77  ?Resp:   13 20  ?Temp:    98.3 ?F (36.8 ?C)  ?TempSrc:    Oral  ?SpO2:    100%  ?Weight:  91.8 kg    ?Height:      ? ?Physical Exam ?General: lying flat in bed, no distress, periorbital edema persists ?Heart: RRR ?Lungs: clear ?Abdomen: soft ?Extremities: 2+ diffuse edema of LE's / UE's / face and back ? ?Additional Objective ?Labs: ?Basic Metabolic Panel: ?Recent Labs  ?Lab 11/29/21 ?2321 11/30/21 ?0321 12/01/21 ?3300 12/02/21 ?7622 12/03/21 ?0501  ?NA  --    < > 138 143 138  ?K  --    < > 3.6 3.9 3.5  ?CL  --    < > 112* 114* 109  ?CO2  --    < > 23 25 24   ?GLUCOSE  --    < > 88 92 96  ?BUN  --    < > 27* 27* 25*  ?CREATININE  --    < > 1.97* 1.84* 1.81*  ?CALCIUM  --    < > 6.8* 7.3* 6.8*  ?PHOS 5.1*  --   --  4.8* 4.9*  ? < > = values in this interval not displayed.  ? ? ?Liver Function Tests: ?Recent Labs  ?Lab 11/29/21 ?2307 11/30/21 ?1646 12/02/21 ?6333 12/03/21 ?0501  ?AST 15 13*  --   --   ?ALT 7 7  --   --   ?ALKPHOS 69 65  --   --   ?BILITOT 0.4 0.4  --   --   ?PROT 3.8* 3.9*  --   --   ?ALBUMIN <1.5* <1.5* <1.5* <1.5*  ? ? ?No results for input(s): LIPASE, AMYLASE in the last 168 hours. ?CBC: ?Recent Labs  ?Lab 11/29/21 ?2307 11/30/21 ?0321 11/30/21 ?1646 12/01/21 ?5456  ?WBC 11.8* 10.9* 13.0* 11.9*  ?NEUTROABS 7.0  --   --   --   ?HGB 9.9* 8.7* 10.1* 8.5*  ?HCT 28.4* 25.4* 29.6* 24.4*  ?MCV 82.6 82.5 82.7 82.2  ?PLT 517* 442* 533* 431*  ? ? ?Blood Culture ?   ?Component Value Date/Time  ? SDES PLEURAL 08/20/2021 1102  ? [place] NONE 08/20/2021 1102  ? CULT  08/20/2021 1102  ?  No growth aerobically or anaerobically. ?Performed at [facility], [place] [address], [place], [place] [address] ?  ?  REPTSTATUS 08/25/2021 FINAL 08/20/2021 1102  ? ? ?Cardiac Enzymes: ?No results for input(s): CKTOTAL, CKMB, CKMBINDEX, TROPONINI in the last 168 hours. ?CBG: ?No results for input(s): GLUCAP in the last 168 hours. ?Iron Studies: No results for input(s): IRON, TIBC, TRANSFERRIN, FERRITIN in the last 72 hours. ?@lablastinr3 @ ?Studies/Results: ?No results found. ?Medications: ? furosemide 120 mg (12/03/21 1342)  ? ? aspirin  81 mg Oral Daily  ? heparin  5,000 Units Subcutaneous Q8H  ? losartan  25 mg Oral Daily  ? metolazone  5 mg Oral BID  ? ? ?Assessment/Plan:  Philip Trevino is an 26 y.o. 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  ? ?**Anasarca/ vol overload:  secondary to FSGS/nephrotic syndrome: started diuresing with lasix 80 TID. Rituxan being arranged outpt.  Resumed losartan 25 daily.  ASA 81 daily DVT prophylaxis in setting of alb < 1.5 + SQ heparin.  I/os, daily weights.  Low na diet, fluid restrict. Will add metolazone 5 bid today, cont IV lasix at 120 mg TID.  ?  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  Baseline Cr in the 2mg /dL range but has had h/o significant AKIs.  Cr stable. Continue to monitor with daily labs in setting of obligatory diuresis.  Rituxan planned per above.  Resumed ARB.   ?  ?**HTN:  resume losartan + diuresis.  BP normal this AM. ?  ?**Anemia, normocytic:  stable in the 8-9 range consistent with past 5 months.  CTM.  ?  ?Will follow, call with questions.  ? ?Kelly Splinter, MD ?12/03/2021, 4:54 PM ? ?Recent Labs  ?Lab 11/30/21 ?1646 12/01/21 ?0806 12/02/21 ?6244 12/03/21 ?0501  ?HGB 10.1* 8.5*  --   --   ?ALBUMIN <1.5*  --  <1.5* <1.5*  ?CALCIUM 7.1* 6.8* 7.3* 6.8*  ?PHOS  --   --  4.8* 4.9*  ?CREATININE 2.05* 1.97* 1.84* 1.81*  ?K 3.7 3.6 3.9 3.5  ? ? ? ? ? ? ?

## 2021-12-03 NOTE — Plan of Care (Signed)
No acute events overnight. ?Problem: Health Behavior/Discharge Planning: ?Goal: Ability to manage health-related needs will improve ?Outcome: Progressing ?  ?Problem: Clinical Measurements: ?Goal: Ability to maintain clinical measurements within normal limits will improve ?Outcome: Progressing ?  ?Problem: Activity: ?Goal: Risk for activity intolerance will decrease ?Outcome: Progressing ?  ?Problem: Nutrition: ?Goal: Adequate nutrition will be maintained ?Outcome: Progressing ?  ?Problem: Elimination: ?Goal: Will not experience complications related to bowel motility ?Outcome: Progressing ?Goal: Will not experience complications related to urinary retention ?Outcome: Progressing ?  ?Problem: Pain Managment: ?Goal: General experience of comfort will improve ?Outcome: Progressing ?  ?

## 2021-12-03 NOTE — Progress Notes (Signed)
?[facility] Progress Note ? ?Patient: Philip Trevino     ?HYW:[id number]  ?DOA: 11/29/2021   ?PCP: Pcp, No  ? ?  ?  ?Brief hospital course: ? 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 She was admitted in 07/2021 and discharged on 08/24/2021 for empyema and underwent a VATS procedure with bilateral chest tubes.  She was supposed to follow-up with Dr. Hollie Salk 2 weeks post discharge. ?On her postop visit with cardiothoracic surgery on 10/25/2021, it was felt that she was cleared from the pulmonary perspective to receive immunotherapy for nephrotic syndrome. ? ?In the ED: noted to have anasarca and pleural effusions. IV Lasix started.  ? ?Subjective:  ?No new complaints.  ? 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  ?Active Problems: ? ?  Normocytic anemia ?-Secondary to renal disease-hemoglobin stable ?- Follow ?  ?DVT prophylaxis:  heparin injection 5,000 Units Start: 11/30/21 0600 ? ?  Code Status: Full Code  ?Consultants: nephrology ?Level of Care: Level of care: Telemetry ?Disposition Plan:  ?Status is: Inpatient ?Remains inpatient appropriate because: AKI, anasarca ? ?Objective: ?  ?Vitals:  ? 12/03/21 0413 12/03/21 0417 12/03/21 0855 12/03/21 1316  ?BP: (!) 147/111  (!) 135/101 (!) 142/109  ?Pulse:   71 77  ?Resp:   13 20  ?Temp:    98.3 ?F (36.8 ?C)  ?TempSrc:    Oral  ?SpO2:    100%  ?Weight:  91.8 kg    ?Height:      ? ?Filed Weights  ? 12/02/21 0500 12/02/21 2218 12/03/21 0417   ?Weight: 94.1 kg 93.3 kg 91.8 kg  ? ?Exam: ?General exam: Appears comfortable  ?HEENT: PERRLA, oral mucosa moist, no sclera icterus or thrush ?Respiratory system: Clear to auscultation. Respiratory effort normal. ?Cardiovascular system: S1 & S2 heard, regular rate and rhythm ?Gastrointestinal system: Abdomen soft, non-tender, nondistended. Normal bowel sounds   ?Central nervous system: Alert and oriented. No focal neurological deficits. ?Extremities: No cyanosis, clubbing + anasarca, improving but not resolved ?Skin: No rashes or ulcers ?Psychiatry:  Mood & affect appropriate.   ? ?Imaging and lab data was personally reviewed ? ? ? CBC: ?Recent Labs  ?Lab 11/29/21 ?2307 11/30/21 ?0321 11/30/21 ?1646 12/01/21 ?4854  ?WBC 11.8* 10.9* 13.0* 11.9*  ?NEUTROABS 7.0  --   --   --   ?HGB 9.9* 8.7* 10.1* 8.5*  ?HCT 28.4* 25.4* 29.6* 24.4*  ?MCV 82.6 82.5 82.7 82.2  ?PLT 517* 442* 533* 431*  ? ? ?Basic Metabolic Panel: ?Recent Labs  ?Lab 11/29/21 ?2307 11/29/21 ?2321 11/30/21 ?0321 11/30/21 ?1646 12/01/21 ?6270 12/02/21 ?3500 12/03/21 ?0501  ?NA 138  --   --  138 138 143 138  ?K 4.2  --   --  3.7 3.6 3.9 3.5  ?CL 117*  --   --  114* 112* 114* 109  ?CO2 21*  --   --  23 23 25 24   ?GLUCOSE 98  --   --  86 88 92 96  ?BUN 26*  --   --  27* 27* 27* 25*  ?CREATININE 2.21*  --  2.34* 2.05* 1.97* 1.84* 1.81*  ?CALCIUM 7.0*  --   --  7.1* 6.8* 7.3* 6.8*  ?MG  --  2.0  --   --   --   --   --   ?PHOS  --  5.1*  --   --   --  4.8* 4.9*  ? ? ?GFR: ?Estimated Creatinine Clearance: 71 mL/min (A) (by C-G formula based on SCr of 1.81 mg/dL (H)). ? ?Scheduled Meds: ? aspirin  81 mg Oral Daily  ? heparin  5,000 Units Subcutaneous Q8H  ? losartan  25 mg Oral Daily  ? ?Continuous Infusions: ? furosemide 120 mg (12/03/21 1342)  ? ? ? LOS: 3 days  ? ?Author: ?Debbe Odea  ?12/03/2021 2:35 PM ?   ?

## 2021-12-03 NOTE — Progress Notes (Signed)
?  Transition of Care (TOC) Screening Note ? ? ?Patient Details  ?Name: Philip Trevino ?Date of Birth: 07/30/1995 ? ? ?Transition of Care (TOC) CM/SW Contact:    ?Torria Fromer, LCSW ?Phone Number: ?12/03/2021, 2:53 PM ? ? ? ?Transition of Care Department [facility]) has reviewed patient and no TOC needs have been identified at this time. We will continue to monitor patient advancement through interdisciplinary progression rounds. If new patient transition needs arise, please place a TOC consult. ? ? ?

## 2021-12-04 DIAGNOSIS — N049 Nephrotic syndrome with unspecified morphologic changes: Secondary | ICD-10-CM | POA: Diagnosis not present

## 2021-12-04 NOTE — Plan of Care (Signed)
?  Problem: Health Behavior/Discharge Planning: ?Goal: Ability to manage health-related needs will improve ?Outcome: Progressing ?  ?Problem: Clinical Measurements: ?Goal: Ability to maintain clinical measurements within normal limits will improve ?Outcome: Progressing ?  ?Problem: Activity: ?Goal: Risk for activity intolerance will decrease ?Outcome: Progressing ?  ?Problem: Nutrition: ?Goal: Adequate nutrition will be maintained ?Outcome: Progressing ?  ?Problem: Elimination: ?Goal: Will not experience complications related to bowel motility ?Outcome: Progressing ?Goal: Will not experience complications related to urinary retention ?Outcome: Progressing ?  ?Problem: Pain Managment: ?Goal: General experience of comfort will improve ?Outcome: Progressing ?  ?

## 2021-12-04 NOTE — TOC Progression Note (Signed)
Transition of Care (TOC) - Progression Note  ? ? ?Patient Details  ?Name: Symir Mah ?MRN: [id number] ?Date of Birth: November 03, 1995 ? ?Transition of Care (TOC) CM/SW Contact  ?Ross Ludwig, LCSW ?Phone Number: ?12/04/2021, 4:59 PM ? ?Clinical Narrative:    ? ?CSW received a message to call Bethel Born from [facility], [phone] for an update on patient.  CSW attempted to call her, however she was unavailable, message left for her to call back.  TOC to continue to follow patient's progress throughout discharge planning. ? ?  ?  ? ?Expected Discharge Plan and Services ?  ?  ?  ?  ?  ?                ?  ?  ?  ?  ?  ?  ?  ?  ?  ?  ? ? ?Social Determinants of Health (SDOH) Interventions ?  ? ?Readmission Risk Interventions ? ?  08/23/2021  ?  3:17 PM 09/28/2020  ?  2:31 PM  ?Readmission Risk Prevention Plan  ?Post Dischage Appt  Complete  ?Medication Screening  Complete  ?Transportation Screening Complete Complete  ?PCP or Specialist Appt within 5-7 Days Complete   ?Home Care Screening Complete   ?Medication Review (RN CM) Referral to Pharmacy   ? ? ?

## 2021-12-04 NOTE — Progress Notes (Signed)
?[facility] Progress Note ? ?Patient: Philip Trevino     ?YKD:[id number]  ?DOA: 11/29/2021   ?PCP: Pcp, No  ? ?  ?  ?Brief hospital course: ? 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 She was admitted in 07/2021 and discharged on 08/24/2021 for empyema and underwent a VATS procedure with bilateral chest tubes.  She was supposed to follow-up with Dr. Hollie Salk 2 weeks post discharge. ?On her postop visit with cardiothoracic surgery on 10/25/2021, it was felt that she was cleared from the pulmonary perspective to receive immunotherapy for nephrotic syndrome. ? ?In the ED: noted to have anasarca and pleural effusions. IV Lasix started.  ? ?Subjective:  ?No complaints ? 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  ?Active Problems: ? ?  Normocytic anemia ?-Secondary to renal disease ? ?DVT prophylaxis:  heparin injection 5,000 Units Start: 11/30/21 0600 ? ?  Code Status: Full Code  ?Consultants: nephrology ?Level of Care: Level of care: Telemetry ?Disposition Plan:  ?Status is: Inpatient ?Remains inpatient appropriate because: AKI, anasarca ? ?Objective: ?  ?Vitals:  ? 12/03/21 0855 12/03/21 1316 12/04/21 0500 12/04/21 1334  ?BP: (!) 135/101 (!) 142/109 (!) 147/103 (!) 139/106  ?Pulse: 71 77 87 (!) 105  ?Resp: 13 20 16 16   ?Temp:  98.3 ?F (36.8 ?C) 98 ?F (36.7 ?C) 99 ?F (37.2 ?C)  ?TempSrc:  Oral Oral   ?SpO2:  100% 99% 97%  ?Weight:      ?Height:      ? ?Filed Weights  ? 12/02/21 0500 12/02/21  2218 12/03/21 0417  ?Weight: 94.1 kg 93.3 kg 91.8 kg  ? ?Exam: ?General exam: Appears comfortable  ?HEENT: PERRLA, oral mucosa moist, no sclera icterus or thrush ?Respiratory system: Clear to auscultation. Respiratory effort normal. ?Cardiovascular system: S1 & S2 heard, regular rate and rhythm ?Gastrointestinal system: Abdomen soft, non-tender, nondistended. Normal bowel sounds   ?Central nervous system: Alert and oriented. No focal neurological deficits. ?Extremities: No cyanosis, clubbing - Anasarca improving ?Skin: No rashes or ulcers ?Psychiatry:  Mood & affect appropriate.   ? ?Imaging and lab data was personally reviewed ? ? ? CBC: ?Recent Labs  ?Lab 11/29/21 ?2307 11/30/21 ?0321 11/30/21 ?1646 12/01/21 ?3976  ?WBC 11.8* 10.9* 13.0* 11.9*  ?NEUTROABS 7.0  --   --   --   ?HGB 9.9* 8.7* 10.1* 8.5*  ?HCT 28.4* 25.4* 29.6* 24.4*  ?MCV 82.6 82.5 82.7 82.2  ?PLT 517* 442* 533* 431*  ? ? ?Basic Metabolic Panel: ?Recent Labs  ?Lab 11/29/21 ?2307 11/29/21 ?2321 11/30/21 ?0321 11/30/21 ?1646 12/01/21 ?7341 12/02/21 ?9379 12/03/21 ?0501  ?NA 138  --   --  138 138 143 138  ?K 4.2  --   --  3.7 3.6 3.9 3.5  ?CL 117*  --   --  114* 112* 114* 109  ?CO2 21*  --   --  23 23 25 24   ?GLUCOSE 98  --   --  86 88 92 96  ?BUN 26*  --   --  27* 27* 27* 25*  ?CREATININE 2.21*  --  2.34* 2.05* 1.97* 1.84* 1.81*  ?CALCIUM 7.0*  --   --  7.1* 6.8* 7.3* 6.8*  ?MG  --  2.0  --   --   --   --   --   ?PHOS  --  5.1*  --   --   --  4.8* 4.9*  ? ? ?GFR: ?Estimated Creatinine Clearance: 71 mL/min (A) (by C-G formula based on SCr of 1.81 mg/dL (H)). ? ?Scheduled Meds: ? aspirin  81 mg Oral Daily  ? heparin  5,000 Units Subcutaneous Q8H  ? losartan  25 mg Oral Daily  ? metolazone  5 mg Oral BID  ? ?Continuous Infusions: ? furosemide 120 mg (12/04/21 0512)  ? ? ? LOS: 4 days  ? ?Author: ?Debbe Odea  ?12/04/2021 2:42 PM ?   ?

## 2021-12-04 NOTE — Progress Notes (Signed)
?  [facility] ?Progress Note  ? ?Subjective:   Feels better, less swollen again today. Had [place] yesterday w/ net negative 2.7 L overall yesterday.  ? ?Objective ?Vitals:  ? 12/03/21 0855 12/03/21 1316 12/04/21 0500 12/04/21 1334  ?BP: (!) 135/101 (!) 142/109 (!) 147/103 (!) 139/106  ?Pulse: 71 77 87 (!) 105  ?Resp: 13 20 16 16   ?Temp:  98.3 ?F (36.8 ?C) 98 ?F (36.7 ?C) 99 ?F (37.2 ?C)  ?TempSrc:  Oral Oral   ?SpO2:  100% 99% 97%  ?Weight:      ?Height:      ? ?Physical Exam ?General: lying flat in bed, no distress, periorbital edema better ?Heart: RRR ?Lungs: clear ?Abdomen: soft ?Extremities: 1-2+ diffuse edema of LE's / UE's,  ?Additional Objective ? ? ? ?Medications: ? furosemide 120 mg (12/04/21 1518)  ? ? aspirin  81 mg Oral Daily  ? heparin  5,000 Units Subcutaneous Q8H  ? losartan  25 mg Oral Daily  ? metolazone  5 mg Oral BID  ? ? ?Assessment/Plan:  Philip Trevino is an 26 y.o. 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  ? ?**Anasarca/ vol overload: secondary to FSGS/nephrotic syndrome: started diuresing with lasix 80 TID. Rituxan being arranged outpt.  Resumed losartan 25 daily.  ASA 81 daily DVT prophylaxis in setting of alb < 1.5 + SQ heparin.  I/os, daily weights.  Low na diet, fluid restrict. Diuresis is improving. 2.6 L net negative yesterday. Cont po zaroxolyn po and IV lasix at 120 mg TID.  ?  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  Baseline Cr in the 2mg /dL range but has had h/o significant AKIs.  Cr stable. Continue to monitor with daily labs in setting of obligatory diuresis.  Rituxan planned per above.  Resumed ARB.   ?  ?**HTN:  resume losartan + diuresis.  BP normal this AM. ?  ?**Anemia, normocytic:  stable in the 8-9 range consistent with past 5 months.  CTM.  ?  ?Will follow, call with questions.  ? ?Kelly Splinter, MD ?12/04/2021, 3:44 PM ? ?Recent Labs  ?Lab 11/30/21 ?1646 12/01/21 ?0806  12/02/21 ?1027 12/03/21 ?0501  ?HGB 10.1* 8.5*  --   --   ?ALBUMIN <1.5*  --  <1.5* <1.5*  ?CALCIUM 7.1* 6.8* 7.3* 6.8*  ?PHOS  --   --  4.8* 4.9*  ?CREATININE 2.05* 1.97* 1.84* 1.81*  ?K 3.7 3.6 3.9 3.5  ? ? ? ? ? ? ? ?

## 2021-12-05 DIAGNOSIS — D649 Anemia, unspecified: Secondary | ICD-10-CM | POA: Diagnosis not present

## 2021-12-05 DIAGNOSIS — N049 Nephrotic syndrome with unspecified morphologic changes: Secondary | ICD-10-CM | POA: Diagnosis not present

## 2021-12-05 LAB — CBC
HCT: 24 % — ABNORMAL LOW (ref 39.0–52.0)
Hemoglobin: 8 g/dL — ABNORMAL LOW (ref 13.0–17.0)
MCH: 27.9 pg (ref 26.0–34.0)
MCHC: 33.3 g/dL (ref 30.0–36.0)
MCV: 83.6 fL (ref 80.0–100.0)
Platelets: 390 10*3/uL (ref 150–400)
RBC: 2.87 MIL/uL — ABNORMAL LOW (ref 4.22–5.81)
RDW: 13.5 % (ref 11.5–15.5)
WBC: 10.9 10*3/uL — ABNORMAL HIGH (ref 4.0–10.5)
nRBC: 0 % (ref 0.0–0.2)

## 2021-12-05 LAB — BASIC METABOLIC PANEL
Anion gap: 4 — ABNORMAL LOW (ref 5–15)
BUN: 27 mg/dL — ABNORMAL HIGH (ref 6–20)
CO2: 25 mmol/L (ref 22–32)
Calcium: 7.2 mg/dL — ABNORMAL LOW (ref 8.9–10.3)
Chloride: 111 mmol/L (ref 98–111)
Creatinine, Ser: 1.8 mg/dL — ABNORMAL HIGH (ref 0.61–1.24)
GFR, Estimated: 53 mL/min — ABNORMAL LOW (ref >60)
Glucose, Bld: 98 mg/dL (ref 70–99)
Potassium: 3.8 mmol/L (ref 3.5–5.1)
Sodium: 140 mmol/L (ref 135–145)

## 2021-12-05 NOTE — TOC Progression Note (Signed)
Transition of Care (TOC) - Progression Note  ? ? ?Patient Details  ?Name: Philip Trevino ?MRN: [id number] ?Date of Birth: [birth year] ? ?Transition of Care (TOC) CM/SW Contact  ?Belle Rose, LCSW ?Phone Number: ?12/05/2021, 10:25 AM ? ?Clinical Narrative:    ? ? ?CSW attempted to call [employer]) from [facility], [phone]; she is unavailable. CSW provided number for call back.  ?  ?  ?  ?  ? ? ?Social Determinants of Health (SDOH) Interventions ?  ? ?Readmission Risk Interventions ? ?  08/23/2021  ?  3:17 PM 09/28/2020  ?  2:31 PM  ?Readmission Risk Prevention Plan  ?Post Dischage Appt  Complete  ?Medication Screening  Complete  ?Transportation Screening Complete Complete  ?PCP or Specialist Appt within 5-7 Days Complete   ?Home Care Screening Complete   ?Medication Review (RN CM) Referral to Pharmacy   ? ? ?

## 2021-12-05 NOTE — Progress Notes (Signed)
[facility] ?PROGRESS NOTE ? ? ? ?Progress Note  ?Philip Trevino  FXT:[id number] DOB: 09-05-1995 DOA: 11/29/2021 ?PCP: Pcp, No  ? ? ? ?Brief Narrative:  ? ?Philip Trevino is an 26 y.o. 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 Hollie Salk 2 weeks postdischarge which she missed. ?She was cleared from pulmonary perspective on her post op surgical visit with cardiothoracic surgery.  And was cleared to restart her immunotherapy for her nephrotic syndrome. ? ? ? ?Assessment/Plan:  ?Anasarca associated with disorder of kidney/  Nephrotic syndrome/due to focal segmental glomerulonephritis: ?Was started IV Lasix and losartan by nephrology. ?Creatinine improved today 1.8. ?Appears fluid overloaded, IV diuresis per nephrology. ?Nephrology has arranged for Rituxan to be started as an outpatient she is currently on losartan aspirin and DVT prophylaxis. ?Follow strict I's and O's and daily weights. ?Continue IV Lasix and metolazone.  Negative about 8 L ? 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 ? ?Essential hypertension: ?Continue losartan and IV diuresis. ? ?Normocytic anemia ?Secondary renal disease.  Currently at baseline 8-9. ? ?DVT prophylaxis: lovenox ?Family Communication:none ?Status is: Inpatient ?Remains inpatient appropriate because: Renal failure ? ? ? ?Code Status:  ? ?  ?Code Status Orders  ?(From admission, onward)  ?  ? ? ?  ? ?  Start     Ordered  ? 11/30/21 0322  Full code  Continuous       ? 11/30/21 0322  ? ?  ?  ? ?  ? ?Code Status History   ? ? Date Active Date Inactive Code Status Order ID Comments User Context  ? 08/14/2021 1756  08/25/2021 1807 Full Code [id number]  Nolberto Hanlon, MD ED  ? 09/27/2020 1652 10/03/2020 1644 Full Code [id number]  Harold Hedge, MD ED  ? 01/28/2020 0517 02/02/2020 1912 Full Code [id number]  Rise Patience, MD ED  ? ?  ? ? ? ? ?IV Access:  ? ?Peripheral IV ? ? ?Procedures and diagnostic studies:  ? ?No results found. ? ? ?Medical Consultants:  ? ?None. ? ? ?Subjective:  ? ? ?Philip Trevino relates feels better ? ?Objective:  ? ? ?Vitals:  ? 12/04/21 0500 12/04/21 1334 12/04/21 2041 12/05/21 0450  ?BP: (!) 147/103 (!) 139/106 (!) 139/100 128/84  ?Pulse: 87 (!) 105 94 89  ?Resp: 16 16 16 15   ?Temp: 98 ?F (36.7 ?C) 99 ?F (37.2 ?C) 98.9 ?F (37.2 ?C) 98.8 ?F (37.1 ?C)  ?TempSrc: Oral  Oral Oral  ?SpO2: 99% 97% 99% 98%  ?Weight:      ?Height:      ? ?SpO2: 98 % ? ? ?Intake/Output Summary (Last 24 hours) at 12/05/2021 1204 ?Last data filed at 12/05/2021 1047 ?Gross per 24 hour  ?Intake 616 ml  ?Output 2375 ml  ?Net -1759 ml  ? ?Filed Weights  ? 12/02/21 0500 12/02/21 2218 12/03/21 0417  ?Weight: 94.1 kg 93.3 kg 91.8 kg  ? ? ?Exam: ?General exam: In no acute distress. ?Respiratory system: Good air movement and clear to auscultation. ?Cardiovascular system: S1 & S2 heard, RRR. No JVD. ?Gastrointestinal system: Abdomen is nondistended, soft and nontender.  ?Extremities: No pedal edema. ?Skin: No rashes, lesions or ulcers ?Psychiatry: Judgement and  insight appear normal. Mood & affect appropriate.  ? ? ?Data Reviewed:  ? ? ?Labs: ?Basic Metabolic Panel: ?Recent Labs  ?Lab 11/29/21 ?2321 11/30/21 ?0321 11/30/21 ?1646 12/01/21 ?1497 12/02/21 ?0263 12/03/21 ?0501 12/05/21 ?0527  ?NA  --   --  138 138 143 138 140  ?K  --   --  3.7 3.6 3.9 3.5 3.8  ?CL  --   --  114* 112* 114* 109 111  ?CO2  --   --  23 23 25 24 25   ?GLUCOSE  --   --  86 88 92 96 98  ?BUN  --   --  27* 27* 27* 25* 27*  ?CREATININE  --    < > 2.05* 1.97* 1.84* 1.81* 1.80*  ?CALCIUM  --   --  7.1* 6.8* 7.3* 6.8* 7.2*  ?MG 2.0  --   --   --   --   --   --   ?PHOS 5.1*  --    --   --  4.8* 4.9*  --   ? < > = values in this interval not displayed.  ? ?GFR ?Estimated Creatinine Clearance: 71.4 mL/min (A) (by C-G formula based on SCr of 1.8 mg/dL (H)). ?Liver Function Tests: ?Recent Labs  ?Lab 11/29/21 ?2307 11/30/21 ?1646 12/02/21 ?7858 12/03/21 ?0501  ?AST 15 13*  --   --   ?ALT 7 7  --   --   ?ALKPHOS 69 65  --   --   ?BILITOT 0.4 0.4  --   --   ?PROT 3.8* 3.9*  --   --   ?ALBUMIN <1.5* <1.5* <1.5* <1.5*  ? ?No results for input(s): LIPASE, AMYLASE in the last 168 hours. ?No results for input(s): AMMONIA in the last 168 hours. ?Coagulation profile ?No results for input(s): INR, PROTIME in the last 168 hours. ?COVID-19 Labs ? ?No results for input(s): DDIMER, FERRITIN, LDH, CRP in the last 72 hours. ? ?Lab Results  ?Component Value Date  ? [place] NEGATIVE 08/14/2021  ? [place] NEGATIVE 07/30/2021  ? [place] NEGATIVE 09/27/2020  ? [place] NEGATIVE 01/28/2020  ? ? ?CBC: ?Recent Labs  ?Lab 11/29/21 ?2307 11/30/21 ?0321 11/30/21 ?1646 12/01/21 ?8502 12/05/21 ?0527  ?WBC 11.8* 10.9* 13.0* 11.9* 10.9*  ?NEUTROABS 7.0  --   --   --   --   ?HGB 9.9* 8.7* 10.1* 8.5* 8.0*  ?HCT 28.4* 25.4* 29.6* 24.4* 24.0*  ?MCV 82.6 82.5 82.7 82.2 83.6  ?PLT 517* 442* 533* 431* 390  ? ?Cardiac Enzymes: ?No results for input(s): CKTOTAL, CKMB, CKMBINDEX, TROPONINI in the last 168 hours. ?BNP (last 3 results) ?No results for input(s): PROBNP in the last 8760 hours. ?CBG: ?No results for input(s): GLUCAP in the last 168 hours. ?D-Dimer: ?No results for input(s): DDIMER in the last 72 hours. ?Hgb A1c: ?No results for input(s): HGBA1C in the last 72 hours. ?Lipid Profile: ?No results for input(s): CHOL, HDL, LDLCALC, TRIG, CHOLHDL, LDLDIRECT in the last 72 hours. ?Thyroid function studies: ?No results for input(s): TSH, T4TOTAL, T3FREE, THYROIDAB in the last 72 hours. ? ?Invalid input(s): FREET3 ?Anemia work up: ?No results for input(s): VITAMINB12, FOLATE, FERRITIN, TIBC, IRON, RETICCTPCT in the  last 72 hours. ?Sepsis Labs: ?Recent Labs  ?Lab 11/30/21 ?0321 11/30/21 ?1646 12/01/21 ?0806 12/05/21 ?0527  ?WBC 10.9* 13.0* 11.9* 10.9*  ? ?Microbiology ?No results found for this or any previous visit (from the past 240 hour(s)). ? ? ?Medications:  ? ? aspirin  81 mg Oral Daily  ?  heparin  5,000 Units Subcutaneous Q8H  ? losartan  25 mg Oral Daily  ? metolazone  5 mg Oral BID  ? ?Continuous Infusions: ? furosemide 120 mg (12/05/21 0553)  ? ? ? ? LOS: 5 days  ? ?Tammi Klippel Aileen Fass  ?[facility] ? ?12/05/2021, 12:04 PM  ? ? ? ? ? ? ?

## 2021-12-05 NOTE — Progress Notes (Signed)
?  [facility] ?Progress Note  ? ?Subjective:   Feels better again today, swelling continues to improve.  ? ?Objective ?Vitals:  ? 12/04/21 0500 12/04/21 1334 12/04/21 2041 12/05/21 0450  ?BP: (!) 147/103 (!) 139/106 (!) 139/100 128/84  ?Pulse: 87 (!) 105 94 89  ?Resp: 16 16 16 15   ?Temp: 98 ?F (36.7 ?C) 99 ?F (37.2 ?C) 98.9 ?F (37.2 ?C) 98.8 ?F (37.1 ?C)  ?TempSrc: Oral  Oral Oral  ?SpO2: 99% 97% 99% 98%  ?Weight:      ?Height:      ? ?Physical Exam ?General: lying flat in bed, no distress, periorbital edema better ?Heart: RRR ?Lungs: clear ?Abdomen: soft ?Extremities: 1-2+ diffuse edema of LE's / UE's,  ?Additional Objective ? ? ? ?Medications: ? furosemide 120 mg (12/05/21 0553)  ? ? aspirin  81 mg Oral Daily  ? heparin  5,000 Units Subcutaneous Q8H  ? losartan  25 mg Oral Daily  ? metolazone  5 mg Oral BID  ? ? ?Assessment/Plan:  Philip Trevino is an 26 y.o. 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  ? ?**Anasarca/ vol overload: secondary to FSGS/nephrotic syndrome: started diuresing with lasix 80 TID. Rituxan being arranged outpt.  Resumed losartan 25 daily.  ASA 81 daily DVT prophylaxis in setting of alb < 1.5 + SQ heparin.  I/os, daily weights.  Low na diet, fluid restrict. Diuresing well. Cont po zaroxolyn po and IV lasix at 120 mg TID. Expect another 1-2 days of diuresing.  ?  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  Baseline Cr in the 2mg /dL range but has had h/o significant AKIs.  Cr stable. Continue to monitor with daily labs in setting of obligatory diuresis.  Rituxan planned per above.  Resumed ARB.   ?  ?**HTN:  resumed losartan.  BP normal now. ?  ?**Anemia, normocytic:  stable in the 8-9 range consistent with past 5 months.  CTM.  ?  ?Will follow, call with questions.  ? ?Kelly Splinter, MD ?12/05/2021, 12:04 PM ? ?Recent Labs  ?Lab 12/01/21 ?0806 12/02/21 ?8891 12/03/21 ?0501 12/05/21 ?0527  ?HGB 8.5*  --   --   8.0*  ?ALBUMIN  --  <1.5* <1.5*  --   ?CALCIUM 6.8* 7.3* 6.8* 7.2*  ?PHOS  --  4.8* 4.9*  --   ?CREATININE 1.97* 1.84* 1.81* 1.80*  ?K 3.6 3.9 3.5 3.8  ? ? ? ? ? ? ? ?

## 2021-12-06 DIAGNOSIS — N049 Nephrotic syndrome with unspecified morphologic changes: Secondary | ICD-10-CM | POA: Diagnosis not present

## 2021-12-06 DIAGNOSIS — N058 Unspecified nephritic syndrome with other morphologic changes: Secondary | ICD-10-CM

## 2021-12-06 DIAGNOSIS — D649 Anemia, unspecified: Secondary | ICD-10-CM | POA: Diagnosis not present

## 2021-12-06 DIAGNOSIS — R601 Generalized edema: Secondary | ICD-10-CM | POA: Diagnosis not present

## 2021-12-06 LAB — BASIC METABOLIC PANEL
Anion gap: 4 — ABNORMAL LOW (ref 5–15)
BUN: 29 mg/dL — ABNORMAL HIGH (ref 6–20)
CO2: 28 mmol/L (ref 22–32)
Calcium: 7.2 mg/dL — ABNORMAL LOW (ref 8.9–10.3)
Chloride: 108 mmol/L (ref 98–111)
Creatinine, Ser: 1.8 mg/dL — ABNORMAL HIGH (ref 0.61–1.24)
GFR, Estimated: 53 mL/min — ABNORMAL LOW (ref >60)
Glucose, Bld: 96 mg/dL (ref 70–99)
Potassium: 3.6 mmol/L (ref 3.5–5.1)
Sodium: 140 mmol/L (ref 135–145)

## 2021-12-06 MED ORDER — TORSEMIDE 40 MG PO TABS: 80.0000 mg | ORAL_TABLET | Freq: Two times a day (BID) | ORAL | 3 refills | Status: DC

## 2021-12-06 MED ORDER — ASPIRIN 81 MG PO CHEW: 81.0000 mg | CHEWABLE_TABLET | Freq: Every day | ORAL | 3 refills | Status: DC

## 2021-12-06 MED ORDER — LOSARTAN POTASSIUM 25 MG PO TABS: 25.0000 mg | ORAL_TABLET | Freq: Every day | ORAL | 3 refills | Status: DC

## 2021-12-06 MED ORDER — TORSEMIDE 20 MG PO TABS
80.0000 mg | ORAL_TABLET | Freq: Two times a day (BID) | ORAL | Status: DC
  Filled 2021-12-06: qty 4

## 2021-12-06 NOTE — Discharge Summary (Signed)
Physician Discharge Summary  Philip Trevino VOZ:[id number] DOB: 1996-01-28 DOA: 11/29/2021  PCP: Pcp, No  Admit date: 11/29/2021 Discharge date: 12/06/2021  Admitted From: Home Disposition:  Home  Recommendations for Outpatient Follow-up:  Follow up with PCP in 1-2 weeks Please obtain BMP/CBC in one week   Home Health:No Equipment/Devices:None  Discharge Condition:Stable CODE STATUS:Full Diet recommendation: Heart Healthy  Brief/Interim Summary: 26 y.o. 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 Hollie Salk 2 weeks postdischarge which she missed.   Discharge Diagnoses:  Principal Problem:   Anasarca associated with disorder of kidney Active Problems:   Nephrotic syndrome   Normocytic anemia   GI bleed  Anasarca associated with renal disease in the setting of nephrotic syndrome probably due to focal segmental glomerulonephritis: He was started on IV Lasix and Zaroxolyn. Neurology was consulted who recommended to start an ACE inhibitor and aspirin. He was started on DVT prophylaxis for his low albumin. He diuresed about 9 L. He creatinine improved on the day of discharge it was 1.8. Nephrology arrange for him to have Rituxan as an outpatient He will go home on torsemide twice aand ASA day aspirin.  Chronic kidney disease stage IIIa secondary to office FSGS: Admission creatinine was 2 he was started on IV Lasix and Zaroxolyn his creatinine improved to 1.8. Nephrology has scheduled him for Rituxan as an outpatient which usual we will follow-up with nephrology in 2 weeks.  Essential hypertension: He will continue ACE inhibitor and diuretics as an outpatient his blood pressure is  improved.  Normocytic anemia: Secondary to renal disease currently at baseline.  Discharge Instructions  Discharge Instructions     Diet - low sodium heart healthy   Complete by: As directed    Increase activity slowly   Complete by: As directed       Allergies as of 12/06/2021       Reactions   Broccoli [brassica Oleracea] Hives   CAULIFLOWER (allergic, also)        Medication List     STOP taking these medications    furosemide 80 MG tablet Commonly known as: LASIX       TAKE these medications    acetaminophen 500 MG tablet Commonly known as: TYLENOL Take 2 tablets (1,000 mg total) by mouth every 6 (six) hours.   atorvastatin 80 MG tablet Commonly known as: LIPITOR Take 1 tablet (80 mg total) by mouth daily.   ezetimibe 10 MG tablet Commonly known as: ZETIA Take 1 tablet (10 mg total) by mouth daily.   gabapentin 250 MG/5ML solution Commonly known as: Neurontin Take 6 mLs (300 mg total) by mouth at bedtime.   losartan 25 MG tablet Commonly known as: COZAAR Take 1 tablet (25 mg total) by mouth daily. Start taking on: Dec 07, 2021   Torsemide 40 MG Tabs Take 80 mg by mouth 2 (two) times daily.        Allergies  Allergen Reactions   Broccoli [Brassica Oleracea] Hives    CAULIFLOWER (allergic, also)    Consultations: Nephrology   Procedures/Studies: DG Chest Port 1 View  Result Date: 11/29/2021 CLINICAL DATA:  Edema. EXAM: PORTABLE CHEST 1 VIEW COMPARISON:  Chest radiograph 10/25/2021, CT 08/20/2021 FINDINGS: Lung volumes are low mild cardiomegaly which may be accentuated  by low volume technique. There are bilateral pleural effusions, similar on the left but slightly increased on the right. Minimal fluid in the right minor fissure. Slight vascular congestion. No pneumothorax. IMPRESSION: 1. Bilateral pleural effusions, similar on the left but slightly increased on the right. 2. Slight vascular congestion. Mild cardiomegaly may be accentuated  by low volume technique. Electronically Signed   By: Keith Rake M.D.   On: 11/29/2021 23:46   (Echo, Carotid, EGD, Colonoscopy, ERCP)    Subjective: No complaints  Discharge Exam: Vitals:   12/06/21 0339 12/06/21 1331  BP: 130/82 (!) 131/95  Pulse: 79 75  Resp: 16 16  Temp: 98.3 F (36.8 C) 98.8 F (37.1 C)  SpO2: 100% 100%   Vitals:   12/05/21 1402 12/05/21 2051 12/06/21 0339 12/06/21 1331  BP: (!) 136/95 (!) 131/95 130/82 (!) 131/95  Pulse: 79 95 79 75  Resp: 16 16 16 16   Temp: 98.6 F (37 C) 98.8 F (37.1 C) 98.3 F (36.8 C) 98.8 F (37.1 C)  TempSrc: Oral   Oral  SpO2: 100% 99% 100% 100%  Weight:   82.5 kg   Height:        General: Pt is alert, awake, not in acute distress Cardiovascular: RRR, S1/S2 +, no rubs, no gallops Respiratory: CTA bilaterally, no wheezing, no rhonchi Abdominal: Soft, NT, ND, bowel sounds + Extremities: no edema, no cyanosis    The results of significant diagnostics from this hospitalization (including imaging, microbiology, ancillary and laboratory) are listed below for reference.     Microbiology: No results found for this or any previous visit (from the past 240 hour(s)).   Labs: BNP (last 3 results) No results for input(s): BNP in the last 8760 hours. Basic Metabolic Panel: Recent Labs  Lab 11/29/21 2321 11/30/21 0321 12/01/21 0806 12/02/21 0539 12/03/21 0501 12/05/21 0527 12/06/21 0526  NA  --    < > 138 143 138 140 140  K  --    < > 3.6 3.9 3.5 3.8 3.6  CL  --    < > 112* 114* 109 111 108  CO2  --    < > 23 25 24 25 28   GLUCOSE  --    < > 88 92 96 98 96  BUN  --    < > 27* 27* 25* 27* 29*  CREATININE  --    < > 1.97* 1.84* 1.81* 1.80* 1.80*  CALCIUM  --    < > 6.8* 7.3* 6.8* 7.2* 7.2*  MG 2.0  --   --   --   --   --   --   PHOS 5.1*  --   --  4.8* 4.9*  --   --    < > = values in this interval not displayed.   Liver Function Tests: Recent Labs  Lab 11/29/21 2307 11/30/21 1646 12/02/21 0539  12/03/21 0501  AST 15 13*  --   --   ALT 7 7  --   --   ALKPHOS 69 65  --   --   BILITOT 0.4 0.4  --   --   PROT 3.8* 3.9*  --   --   ALBUMIN <1.5* <1.5* <1.5* <1.5*   No results for input(s): LIPASE, AMYLASE in the last 168 hours. No results for input(s): AMMONIA in the last 168 hours. CBC: Recent Labs  Lab 11/29/21 2307 11/30/21 0321 11/30/21 1646 12/01/21 0806 12/05/21 0527  WBC 11.8* 10.9* 13.0* 11.9* 10.9*  NEUTROABS 7.0  --   --   --   --   HGB 9.9* 8.7* 10.1* 8.5* 8.0*  HCT 28.4* 25.4* 29.6* 24.4* 24.0*  MCV 82.6 82.5 82.7 82.2 83.6  PLT 517* 442* 533* 431* 390   Cardiac Enzymes: No results for input(s): CKTOTAL, CKMB, CKMBINDEX, TROPONINI in the last 168 hours. BNP: Invalid input(s): POCBNP CBG: No results for input(s): GLUCAP in the last 168 hours. D-Dimer No results for input(s): DDIMER in the last 72 hours. Hgb A1c No results for input(s): HGBA1C in the last 72 hours. Lipid Profile No results for input(s): CHOL, HDL, LDLCALC, TRIG, CHOLHDL, LDLDIRECT in the last 72 hours. Thyroid function studies No results for input(s): TSH, T4TOTAL, T3FREE, THYROIDAB in the last 72 hours.  Invalid input(s): FREET3 Anemia work up No results for input(s): VITAMINB12, FOLATE, FERRITIN, TIBC, IRON, RETICCTPCT in the last 72 hours. Urinalysis    Component Value Date/Time   COLORURINE YELLOW 11/30/2021 0028   APPEARANCEUR CLOUDY (A) 11/30/2021 0028   LABSPEC 1.028 11/30/2021 0028   PHURINE 5.0 11/30/2021 0028   GLUCOSEU NEGATIVE 11/30/2021 0028   HGBUR SMALL (A) 11/30/2021 0028   BILIRUBINUR NEGATIVE 11/30/2021 0028   KETONESUR NEGATIVE 11/30/2021 0028   PROTEINUR >=300 (A) 11/30/2021 0028   UROBILINOGEN 1.0 03/02/2019 1944   NITRITE NEGATIVE 11/30/2021 0028   LEUKOCYTESUR NEGATIVE 11/30/2021 0028   Sepsis Labs Invalid input(s): PROCALCITONIN,  WBC,  LACTICIDVEN Microbiology No results found for this or any previous visit (from the past 240  hour(s)).   SIGNED:   Charlynne Cousins, MD  [facility] 12/06/2021, 4:29 PM Pager   If 7PM-7AM, please contact night-coverage [email or web address] Password [email or web address]

## 2021-12-06 NOTE — Progress Notes (Signed)
[facility] PROGRESS NOTE    Progress Note  Philip Trevino  FVC:[id number] DOB: 01-28-1996 DOA: 11/29/2021 PCP: Pcp, No     Brief Narrative:   Philip Trevino is an 26 y.o. 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 Hollie Salk 2 weeks postdischarge which she missed. She was cleared from pulmonary perspective on her post op surgical visit with cardiothoracic surgery.  And was cleared to restart her immunotherapy for her nephrotic syndrome.   Assessment/Plan:  Anasarca associated with disorder of kidney/  Nephrotic syndrome/due to focal segmental glomerulonephritis: Was started IV Lasix, Zaroxolyn and losartan by nephrology. Nephrology also recommended to restart back her aspirin. Continue DVT prophylaxis in the setting of albumin less than 1.5. Low-sodium diet. Creatinine improved today 1.8. Appears fluid overloaded, IV diuresis per Nephrology has arranged for Rituxan to be started as an outpatient. Follow strict I's and O's and daily weights. Continues to be negative about 9 years.  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 Creatinine stable at 1.8.  Essential hypertension: Continue losartan and IV diuresis.  Normocytic anemia Secondary renal disease.  Currently at baseline 8-9.  DVT prophylaxis: lovenox Family Communication:none Status is: Inpatient Remains inpatient appropriate because: Renal failure    Code Status:     Code Status Orders  (From admission, onward)           Start     Ordered   11/30/21 0322  Full code  Continuous        11/30/21 0322           Code Status History      Date Active Date Inactive Code Status Order ID Comments User Context   08/14/2021 1756 08/25/2021 1807 Full Code [id number]  Nolberto Hanlon, MD ED   09/27/2020 1652 10/03/2020 1644 Full Code [id number]  Harold Hedge, MD ED   01/28/2020 0517 02/02/2020 1912 Full Code [id number]  Rise Patience, MD ED         IV Access:   Peripheral IV   Procedures and diagnostic studies:   No results found.   Medical Consultants:   None.   Subjective:    Philip Trevino no complaints this morning.  Objective:    Vitals:   12/05/21 0450 12/05/21 1402 12/05/21 2051 12/06/21 0339  BP: 128/84 (!) 136/95 (!) 131/95 130/82  Pulse: 89 79 95 79  Resp: 15 16 16 16   Temp: 98.8 F (37.1 C) 98.6 F (37 C) 98.8 F (37.1 C) 98.3 F (36.8 C)  TempSrc: Oral Oral    SpO2: 98% 100% 99% 100%  Weight:    82.5 kg  Height:       SpO2: 100 %   Intake/Output Summary (Last 24 hours) at 12/06/2021 0953 Last data filed at 12/06/2021 0818 Gross per 24 hour  Intake 1724 ml  Output 3850 ml  Net -2126 ml    Filed Weights   12/02/21 2218 12/03/21 0417 12/06/21 0339  Weight: 93.3 kg 91.8 kg 82.5 kg    Exam: General exam: In no acute distress. Respiratory system: Good air movement and clear to auscultation. Cardiovascular system: S1 & S2 heard, RRR. No JVD. Gastrointestinal system: Abdomen is nondistended, soft and nontender.  Extremities: No pedal edema. Skin: No rashes, lesions or ulcers Psychiatry: Judgement and insight appear normal. Mood & affect appropriate.   Data Reviewed:    Labs: Basic Metabolic Panel: Recent Labs  Lab 11/29/21 2321 11/30/21 0321 12/01/21 0350 12/02/21 0539 12/03/21 0501 12/05/21 0527 12/06/21 0526  NA  --    < > 138 143 138 140 140  K  --    < > 3.6 3.9 3.5 3.8 3.6  CL  --    < > 112* 114* 109 111 108  CO2  --    < > 23 25 24 25 28   GLUCOSE  --    < > 88 92 96 98 96  BUN  --    < > 27* 27* 25* 27* 29*  CREATININE  --    < > 1.97* 1.84* 1.81* 1.80*  1.80*  CALCIUM  --    < > 6.8* 7.3* 6.8* 7.2* 7.2*  MG 2.0  --   --   --   --   --   --   PHOS 5.1*  --   --  4.8* 4.9*  --   --    < > = values in this interval not displayed.    GFR Estimated Creatinine Clearance: 64.8 mL/min (A) (by C-G formula based on SCr of 1.8 mg/dL (H)). Liver Function Tests: Recent Labs  Lab 11/29/21 2307 11/30/21 1646 12/02/21 0539 12/03/21 0501  AST 15 13*  --   --   ALT 7 7  --   --   ALKPHOS 69 65  --   --   BILITOT 0.4 0.4  --   --   PROT 3.8* 3.9*  --   --   ALBUMIN <1.5* <1.5* <1.5* <1.5*    No results for input(s): LIPASE, AMYLASE in the last 168 hours. No results for input(s): AMMONIA in the last 168 hours. Coagulation profile No results for input(s): INR, PROTIME in the last 168 hours. COVID-19 Labs  No results for input(s): DDIMER, FERRITIN, LDH, CRP in the last 72 hours.  Lab Results  Component Value Date   SARSCOV2NAA NEGATIVE 08/14/2021   [place] NEGATIVE 07/30/2021   [place] NEGATIVE 09/27/2020   [place] NEGATIVE 01/28/2020    CBC: Recent Labs  Lab 11/29/21 2307 11/30/21 0321 11/30/21 1646 12/01/21 0806 12/05/21 0527  WBC 11.8* 10.9* 13.0* 11.9* 10.9*  NEUTROABS 7.0  --   --   --   --   HGB 9.9* 8.7* 10.1* 8.5* 8.0*  HCT 28.4* 25.4* 29.6* 24.4* 24.0*  MCV 82.6 82.5 82.7 82.2 83.6  PLT 517* 442* 533* 431* 390    Cardiac Enzymes: No results for input(s): CKTOTAL, CKMB, CKMBINDEX, TROPONINI in the last 168 hours. BNP (last 3 results) No results for input(s): PROBNP in the last 8760 hours. CBG: No results for input(s): GLUCAP in the last 168 hours. D-Dimer: No results for input(s): DDIMER in the last 72 hours. Hgb A1c: No results for input(s): HGBA1C in the last 72 hours. Lipid Profile: No results for input(s): CHOL, HDL, LDLCALC, TRIG, CHOLHDL, LDLDIRECT in the last 72 hours. Thyroid function studies: No results for input(s): TSH, T4TOTAL, T3FREE, THYROIDAB in the last 72 hours.  Invalid input(s):  FREET3 Anemia work up: No results for input(s): VITAMINB12, FOLATE, FERRITIN, TIBC, IRON, RETICCTPCT in the last 72 hours. Sepsis Labs: Recent Labs  Lab 11/30/21 0321 11/30/21 1646 12/01/21 0806 12/05/21 0527  WBC 10.9* 13.0* 11.9* 10.9*    Microbiology No results found for  this or any previous visit (from the past 240 hour(s)).   Medications:    aspirin  81 mg Oral Daily   heparin  5,000 Units Subcutaneous Q8H   losartan  25 mg Oral Daily   metolazone  5 mg Oral BID   Continuous Infusions:  furosemide 120 mg (12/06/21 0620)      LOS: 6 days   Philip Trevino  [facility]  12/06/2021, 9:53 AM

## 2021-12-06 NOTE — Progress Notes (Signed)
  [facility] Progress Note   Subjective:   down to 182lbs, wants to go home  Objective Vitals:   12/05/21 1402 12/05/21 2051 12/06/21 0339 12/06/21 1331  BP: (!) 136/95 (!) 131/95 130/82 (!) 131/95  Pulse: 79 95 79 75  Resp: 16 16 16 16   Temp: 98.6 F (37 C) 98.8 F (37.1 C) 98.3 F (36.8 C) 98.8 F (37.1 C)  TempSrc: Oral   Oral  SpO2: 100% 99% 100% 100%  Weight:   82.5 kg   Height:       Physical Exam General: lying flat in bed, no distress Heart: RRR Lungs: clear Abdomen: soft Extremities: 1-2+ diffuse edema of LE's / UE's,  Additional Objective    Medications:  furosemide 120 mg (12/06/21 0620)    aspirin  81 mg Oral Daily   heparin  5,000 Units Subcutaneous Q8H   losartan  25 mg Oral Daily   metolazone  5 mg Oral BID    Assessment/Plan:  Philip Trevino is an 26 y.o. 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   **Anasarca/ vol overload: secondary to FSGS/nephrotic syndrome: started diuresing with lasix 80 TID. Rituxan being arranged outpt.  Resumed losartan 25 daily.  ASA 81 daily DVT prophylaxis in setting of alb < 1.5 + SQ heparin.  I/os, daily weights.  Low na diet, fluid restrict. Diuresed well here w/ IV lasix 120 tid and po zaroxolyn 5 bid for about 1 week. OK for dc today.  Will stop his home lasix and start torsemide 80 bid instead given low albumin levels. He knows to f/u w/ Dr Hollie Salk.  Will sign off.    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  Baseline Cr in the 2mg /dL range but has had h/o significant AKIs.  Cr stable. Continue to monitor with daily labs in setting of obligatory diuresis.  Rituxan planned per above.  Resumed ARB.     **HTN:  resumed losartan.  BP normal now.   **Anemia, normocytic:  stable in the 8-9 range consistent with past 5 months.  CTM.     Kelly Splinter, MD 12/06/2021, 1:36 PM  Recent Labs  Lab 12/01/21 0806 12/02/21 0539 12/03/21 0501  12/05/21 0527 12/06/21 0526  HGB 8.5*  --   --  8.0*  --   ALBUMIN  --  <1.5* <1.5*  --   --   CALCIUM 6.8* 7.3* 6.8* 7.2* 7.2*  PHOS  --  4.8* 4.9*  --   --   CREATININE 1.97* 1.84* 1.81* 1.80* 1.80*  K 3.6 3.9 3.5 3.8 3.6

## 2021-12-06 NOTE — Progress Notes (Signed)
Patient will be discharging this evening with family. Belongings were returned to him, education on medications will be provided.

## 2021-12-19 ENCOUNTER — Encounter (INDEPENDENT_AMBULATORY_CARE_PROVIDER_SITE_OTHER): Payer: Self-pay | Admitting: Primary Care

## 2021-12-20 ENCOUNTER — Other Ambulatory Visit (HOSPITAL_COMMUNITY): Payer: Self-pay

## 2021-12-21 ENCOUNTER — Encounter (HOSPITAL_COMMUNITY): Admission: RE | Admit: 2021-12-21 | Payer: Medicaid Other | Source: Ambulatory Visit

## 2021-12-24 ENCOUNTER — Other Ambulatory Visit (HOSPITAL_COMMUNITY): Payer: Self-pay | Admitting: *Deleted

## 2021-12-24 DIAGNOSIS — N049 Nephrotic syndrome with unspecified morphologic changes: Secondary | ICD-10-CM

## 2021-12-25 ENCOUNTER — Encounter (HOSPITAL_COMMUNITY)
Admission: RE | Admit: 2021-12-25 | Discharge: 2021-12-25 | Disposition: A | Discharge status: Home / Self Care | Payer: Medicaid Other | Source: Ambulatory Visit | Attending: Nephrology | Admitting: Nephrology

## 2021-12-25 DIAGNOSIS — N049 Nephrotic syndrome with unspecified morphologic changes: Secondary | ICD-10-CM | POA: Diagnosis present

## 2021-12-25 MED ORDER — METHYLPREDNISOLONE SODIUM SUCC 125 MG IJ SOLR
INTRAMUSCULAR | Status: AC
  Administered 2021-12-25: 125 mg via INTRAVENOUS
  Filled 2021-12-25: qty 2

## 2021-12-25 MED ORDER — SODIUM CHLORIDE 0.9 % IV SOLN
1000.0000 mg | Freq: Once | INTRAVENOUS | Status: AC
  Administered 2021-12-25: 1000 mg via INTRAVENOUS
  Filled 2021-12-25: qty 100

## 2021-12-25 MED ORDER — METHYLPREDNISOLONE SODIUM SUCC 125 MG IJ SOLR: 125.0000 mg | Freq: Once | INTRAMUSCULAR | Status: AC

## 2021-12-25 MED ORDER — ACETAMINOPHEN 325 MG PO TABS
ORAL_TABLET | ORAL | Status: AC
  Administered 2021-12-25: 650 mg via ORAL
  Filled 2021-12-25: qty 2

## 2021-12-25 MED ORDER — ACETAMINOPHEN 325 MG PO TABS: 650.0000 mg | ORAL_TABLET | Freq: Once | ORAL | Status: AC

## 2021-12-25 MED ORDER — DIPHENHYDRAMINE HCL 50 MG/ML IJ SOLN
INTRAMUSCULAR | Status: AC
  Administered 2021-12-25: 25 mg via INTRAVENOUS
  Filled 2021-12-25: qty 1

## 2021-12-25 MED ORDER — DIPHENHYDRAMINE HCL 50 MG/ML IJ SOLN: 25.0000 mg | Freq: Once | INTRAMUSCULAR | Status: AC

## 2021-12-26 ENCOUNTER — Inpatient Hospital Stay (HOSPITAL_COMMUNITY): Admission: RE | Admit: 2021-12-26 | Payer: Medicaid Other | Source: Ambulatory Visit

## 2021-12-27 LAB — QUANTIFERON-TB GOLD PLUS (RQFGPL)
QuantiFERON Mitogen Value: 4.06 IU/mL
QuantiFERON Nil Value: 0.01 IU/mL
QuantiFERON TB1 Ag Value: 0.01 IU/mL
QuantiFERON TB2 Ag Value: 0.01 IU/mL

## 2021-12-27 LAB — QUANTIFERON-TB GOLD PLUS: QuantiFERON-TB Gold Plus: NEGATIVE

## 2022-01-04 ENCOUNTER — Encounter (HOSPITAL_COMMUNITY): Payer: Self-pay | Admitting: Emergency Medicine

## 2022-01-04 ENCOUNTER — Encounter (HOSPITAL_COMMUNITY): Payer: Medicaid Other

## 2022-01-04 ENCOUNTER — Emergency Department (HOSPITAL_COMMUNITY): Payer: Medicaid Other

## 2022-01-04 ENCOUNTER — Inpatient Hospital Stay (HOSPITAL_COMMUNITY)
Admission: EM | Admit: 2022-01-04 | Discharge: 2022-01-11 | DRG: 871 | Disposition: A | Discharge status: Home / Self Care | Payer: Medicaid Other | Attending: Family Medicine | Admitting: Family Medicine

## 2022-01-04 DIAGNOSIS — Z79899 Other long term (current) drug therapy: Secondary | ICD-10-CM

## 2022-01-04 DIAGNOSIS — E782 Mixed hyperlipidemia: Secondary | ICD-10-CM | POA: Diagnosis present

## 2022-01-04 DIAGNOSIS — N179 Acute kidney failure, unspecified: Secondary | ICD-10-CM | POA: Diagnosis present

## 2022-01-04 DIAGNOSIS — N049 Nephrotic syndrome with unspecified morphologic changes: Secondary | ICD-10-CM

## 2022-01-04 DIAGNOSIS — I160 Hypertensive urgency: Secondary | ICD-10-CM | POA: Diagnosis present

## 2022-01-04 DIAGNOSIS — D509 Iron deficiency anemia, unspecified: Secondary | ICD-10-CM | POA: Diagnosis present

## 2022-01-04 DIAGNOSIS — R601 Generalized edema: Secondary | ICD-10-CM | POA: Diagnosis present

## 2022-01-04 DIAGNOSIS — Z7982 Long term (current) use of aspirin: Secondary | ICD-10-CM

## 2022-01-04 DIAGNOSIS — E876 Hypokalemia: Secondary | ICD-10-CM | POA: Diagnosis present

## 2022-01-04 DIAGNOSIS — I129 Hypertensive chronic kidney disease with stage 1 through stage 4 chronic kidney disease, or unspecified chronic kidney disease: Secondary | ICD-10-CM | POA: Diagnosis present

## 2022-01-04 DIAGNOSIS — F17200 Nicotine dependence, unspecified, uncomplicated: Secondary | ICD-10-CM | POA: Diagnosis present

## 2022-01-04 DIAGNOSIS — J189 Pneumonia, unspecified organism: Secondary | ICD-10-CM | POA: Diagnosis present

## 2022-01-04 DIAGNOSIS — D631 Anemia in chronic kidney disease: Secondary | ICD-10-CM | POA: Diagnosis present

## 2022-01-04 DIAGNOSIS — A419 Sepsis, unspecified organism: Principal | ICD-10-CM

## 2022-01-04 DIAGNOSIS — Z91199 Patient's noncompliance with other medical treatment and regimen due to unspecified reason: Secondary | ICD-10-CM

## 2022-01-04 DIAGNOSIS — E8770 Fluid overload, unspecified: Secondary | ICD-10-CM | POA: Diagnosis present

## 2022-01-04 DIAGNOSIS — Z20822 Contact with and (suspected) exposure to covid-19: Secondary | ICD-10-CM | POA: Diagnosis present

## 2022-01-04 DIAGNOSIS — Z91018 Allergy to other foods: Secondary | ICD-10-CM

## 2022-01-04 DIAGNOSIS — N1831 Chronic kidney disease, stage 3a: Secondary | ICD-10-CM | POA: Diagnosis present

## 2022-01-04 HISTORY — DX: Chronic kidney disease, unspecified: N18.9

## 2022-01-04 LAB — CBC WITH DIFFERENTIAL/PLATELET
Abs Immature Granulocytes: 0.06 10*3/uL (ref 0.00–0.07)
Basophils Absolute: 0.1 10*3/uL (ref 0.0–0.1)
Basophils Relative: 1 %
Eosinophils Absolute: 0.2 10*3/uL (ref 0.0–0.5)
Eosinophils Relative: 1 %
HCT: 26.4 % — ABNORMAL LOW (ref 39.0–52.0)
Hemoglobin: 8.9 g/dL — ABNORMAL LOW (ref 13.0–17.0)
Immature Granulocytes: 0 %
Lymphocytes Relative: 6 %
Lymphs Abs: 1 10*3/uL (ref 0.7–4.0)
MCH: 28.3 pg (ref 26.0–34.0)
MCHC: 33.7 g/dL (ref 30.0–36.0)
MCV: 83.8 fL (ref 80.0–100.0)
Monocytes Absolute: 0.7 10*3/uL (ref 0.1–1.0)
Monocytes Relative: 5 %
Neutro Abs: 13.2 10*3/uL — ABNORMAL HIGH (ref 1.7–7.7)
Neutrophils Relative %: 87 %
Platelets: 379 10*3/uL (ref 150–400)
RBC: 3.15 MIL/uL — ABNORMAL LOW (ref 4.22–5.81)
RDW: 13.2 % (ref 11.5–15.5)
WBC: 15.2 10*3/uL — ABNORMAL HIGH (ref 4.0–10.5)
nRBC: 0 % (ref 0.0–0.2)

## 2022-01-04 LAB — COMPREHENSIVE METABOLIC PANEL
ALT: 8 U/L (ref 0–44)
AST: 19 U/L (ref 15–41)
Albumin: <1.5 g/dL — ABNORMAL LOW (ref 3.5–5.0)
Alkaline Phosphatase: 60 U/L (ref 38–126)
Anion gap: 3 — ABNORMAL LOW (ref 5–15)
BUN: 29 mg/dL — ABNORMAL HIGH (ref 6–20)
CO2: 21 mmol/L — ABNORMAL LOW (ref 22–32)
Calcium: 6.7 mg/dL — ABNORMAL LOW (ref 8.9–10.3)
Chloride: 116 mmol/L — ABNORMAL HIGH (ref 98–111)
Creatinine, Ser: 2.47 mg/dL — ABNORMAL HIGH (ref 0.61–1.24)
GFR, Estimated: 36 mL/min — ABNORMAL LOW (ref >60)
Glucose, Bld: 89 mg/dL (ref 70–99)
Potassium: 3.5 mmol/L (ref 3.5–5.1)
Sodium: 140 mmol/L (ref 135–145)
Total Bilirubin: 0.3 mg/dL (ref 0.3–1.2)
Total Protein: 3.5 g/dL — ABNORMAL LOW (ref 6.5–8.1)

## 2022-01-04 LAB — LACTIC ACID, PLASMA: Lactic Acid, Venous: 1.2 mmol/L (ref 0.5–1.9)

## 2022-01-04 MED ORDER — ACETAMINOPHEN 500 MG PO TABS
1000.0000 mg | ORAL_TABLET | Freq: Once | ORAL | Status: AC
  Administered 2022-01-04: 1000 mg via ORAL
  Filled 2022-01-04: qty 2

## 2022-01-04 MED ORDER — MORPHINE SULFATE (PF) 4 MG/ML IV SOLN
4.0000 mg | Freq: Once | INTRAVENOUS | Status: AC
  Administered 2022-01-04: 4 mg via INTRAVENOUS
  Filled 2022-01-04: qty 1

## 2022-01-04 MED ORDER — LACTATED RINGERS IV BOLUS (SEPSIS)
500.0000 mL | Freq: Once | INTRAVENOUS | Status: AC
  Administered 2022-01-04: 500 mL via INTRAVENOUS

## 2022-01-04 MED ORDER — LACTATED RINGERS IV SOLN
INTRAVENOUS | Status: DC
Start: 2022-01-04 — End: 2022-01-05

## 2022-01-04 MED ORDER — LACTATED RINGERS IV BOLUS (SEPSIS)
1000.0000 mL | Freq: Once | INTRAVENOUS | Status: AC
  Administered 2022-01-04: 1000 mL via INTRAVENOUS

## 2022-01-04 MED ORDER — ONDANSETRON HCL 4 MG/2ML IJ SOLN
4.0000 mg | Freq: Once | INTRAMUSCULAR | Status: AC
  Administered 2022-01-04: 4 mg via INTRAVENOUS
  Filled 2022-01-04: qty 2

## 2022-01-04 NOTE — ED Triage Notes (Addendum)
Patient hx nephrotic syndrome, c/o facial swelling x2 weeks since d/c after admission. C/o abdominal pain today with nausea. Patient's sister at bedside.

## 2022-01-05 ENCOUNTER — Other Ambulatory Visit: Payer: Self-pay

## 2022-01-05 ENCOUNTER — Observation Stay (HOSPITAL_COMMUNITY): Payer: Medicaid Other

## 2022-01-05 ENCOUNTER — Encounter (HOSPITAL_COMMUNITY): Payer: Self-pay | Admitting: Emergency Medicine

## 2022-01-05 DIAGNOSIS — Z91199 Patient's noncompliance with other medical treatment and regimen due to unspecified reason: Secondary | ICD-10-CM | POA: Diagnosis not present

## 2022-01-05 DIAGNOSIS — E876 Hypokalemia: Secondary | ICD-10-CM | POA: Diagnosis present

## 2022-01-05 DIAGNOSIS — Z79899 Other long term (current) drug therapy: Secondary | ICD-10-CM | POA: Diagnosis not present

## 2022-01-05 DIAGNOSIS — F17200 Nicotine dependence, unspecified, uncomplicated: Secondary | ICD-10-CM | POA: Diagnosis present

## 2022-01-05 DIAGNOSIS — J189 Pneumonia, unspecified organism: Secondary | ICD-10-CM | POA: Diagnosis present

## 2022-01-05 DIAGNOSIS — N049 Nephrotic syndrome with unspecified morphologic changes: Secondary | ICD-10-CM

## 2022-01-05 DIAGNOSIS — R601 Generalized edema: Secondary | ICD-10-CM | POA: Diagnosis not present

## 2022-01-05 DIAGNOSIS — D509 Iron deficiency anemia, unspecified: Secondary | ICD-10-CM | POA: Diagnosis present

## 2022-01-05 DIAGNOSIS — Z20822 Contact with and (suspected) exposure to covid-19: Secondary | ICD-10-CM | POA: Diagnosis present

## 2022-01-05 DIAGNOSIS — E782 Mixed hyperlipidemia: Secondary | ICD-10-CM | POA: Diagnosis present

## 2022-01-05 DIAGNOSIS — D631 Anemia in chronic kidney disease: Secondary | ICD-10-CM | POA: Diagnosis present

## 2022-01-05 DIAGNOSIS — N1831 Chronic kidney disease, stage 3a: Secondary | ICD-10-CM | POA: Diagnosis present

## 2022-01-05 DIAGNOSIS — N179 Acute kidney failure, unspecified: Secondary | ICD-10-CM

## 2022-01-05 DIAGNOSIS — E8770 Fluid overload, unspecified: Secondary | ICD-10-CM | POA: Diagnosis present

## 2022-01-05 DIAGNOSIS — I129 Hypertensive chronic kidney disease with stage 1 through stage 4 chronic kidney disease, or unspecified chronic kidney disease: Secondary | ICD-10-CM | POA: Diagnosis present

## 2022-01-05 DIAGNOSIS — I160 Hypertensive urgency: Secondary | ICD-10-CM | POA: Diagnosis present

## 2022-01-05 DIAGNOSIS — A419 Sepsis, unspecified organism: Secondary | ICD-10-CM

## 2022-01-05 DIAGNOSIS — Z91018 Allergy to other foods: Secondary | ICD-10-CM | POA: Diagnosis not present

## 2022-01-05 DIAGNOSIS — Z7982 Long term (current) use of aspirin: Secondary | ICD-10-CM | POA: Diagnosis not present

## 2022-01-05 LAB — CBC WITH DIFFERENTIAL/PLATELET
Abs Immature Granulocytes: 0.04 10*3/uL (ref 0.00–0.07)
Basophils Absolute: 0.1 10*3/uL (ref 0.0–0.1)
Basophils Relative: 1 %
Eosinophils Absolute: 0.3 10*3/uL (ref 0.0–0.5)
Eosinophils Relative: 2 %
HCT: 25.3 % — ABNORMAL LOW (ref 39.0–52.0)
Hemoglobin: 8.6 g/dL — ABNORMAL LOW (ref 13.0–17.0)
Immature Granulocytes: 0 %
Lymphocytes Relative: 7 %
Lymphs Abs: 0.8 10*3/uL (ref 0.7–4.0)
MCH: 28.6 pg (ref 26.0–34.0)
MCHC: 34 g/dL (ref 30.0–36.0)
MCV: 84.1 fL (ref 80.0–100.0)
Monocytes Absolute: 0.9 10*3/uL (ref 0.1–1.0)
Monocytes Relative: 8 %
Neutro Abs: 10.1 10*3/uL — ABNORMAL HIGH (ref 1.7–7.7)
Neutrophils Relative %: 82 %
Platelets: 295 10*3/uL (ref 150–400)
RBC: 3.01 MIL/uL — ABNORMAL LOW (ref 4.22–5.81)
RDW: 13.2 % (ref 11.5–15.5)
WBC: 12.2 10*3/uL — ABNORMAL HIGH (ref 4.0–10.5)
nRBC: 0 % (ref 0.0–0.2)

## 2022-01-05 LAB — URINALYSIS, ROUTINE W REFLEX MICROSCOPIC
Bilirubin Urine: NEGATIVE
Glucose, UA: NEGATIVE mg/dL
Ketones, ur: NEGATIVE mg/dL
Leukocytes,Ua: NEGATIVE
Nitrite: NEGATIVE
Protein, ur: >=300 mg/dL — AB
Specific Gravity, Urine: 1.025 (ref 1.005–1.030)
pH: 5 (ref 5.0–8.0)

## 2022-01-05 LAB — COMPREHENSIVE METABOLIC PANEL
ALT: 10 U/L (ref 0–44)
AST: 17 U/L (ref 15–41)
Albumin: <1.5 g/dL — ABNORMAL LOW (ref 3.5–5.0)
Alkaline Phosphatase: 59 U/L (ref 38–126)
Anion gap: 3 — ABNORMAL LOW (ref 5–15)
BUN: 29 mg/dL — ABNORMAL HIGH (ref 6–20)
CO2: 22 mmol/L (ref 22–32)
Calcium: 6.6 mg/dL — ABNORMAL LOW (ref 8.9–10.3)
Chloride: 114 mmol/L — ABNORMAL HIGH (ref 98–111)
Creatinine, Ser: 2.52 mg/dL — ABNORMAL HIGH (ref 0.61–1.24)
GFR, Estimated: 35 mL/min — ABNORMAL LOW (ref >60)
Glucose, Bld: 103 mg/dL — ABNORMAL HIGH (ref 70–99)
Potassium: 3.5 mmol/L (ref 3.5–5.1)
Sodium: 139 mmol/L (ref 135–145)
Total Bilirubin: 0.7 mg/dL (ref 0.3–1.2)
Total Protein: 3.4 g/dL — ABNORMAL LOW (ref 6.5–8.1)

## 2022-01-05 LAB — CREATININE, URINE, RANDOM: Creatinine, Urine: 192.93 mg/dL

## 2022-01-05 LAB — PROTIME-INR
INR: 1.2 (ref 0.8–1.2)
Prothrombin Time: 15.3 seconds — ABNORMAL HIGH (ref 11.4–15.2)

## 2022-01-05 LAB — RESP PANEL BY RT-PCR (FLU A&B, COVID) ARPGX2
Influenza A by PCR: NEGATIVE
Influenza B by PCR: NEGATIVE
SARS Coronavirus 2 by RT PCR: NEGATIVE

## 2022-01-05 LAB — MAGNESIUM: Magnesium: 1.8 mg/dL (ref 1.7–2.4)

## 2022-01-05 LAB — PROCALCITONIN: Procalcitonin: 0.66 ng/mL

## 2022-01-05 LAB — LACTIC ACID, PLASMA
Lactic Acid, Venous: 0.6 mmol/L (ref 0.5–1.9)
Lactic Acid, Venous: 0.5 mmol/L (ref 0.5–1.9)

## 2022-01-05 LAB — SODIUM, URINE, RANDOM: Sodium, Ur: 30 mmol/L

## 2022-01-05 LAB — C-REACTIVE PROTEIN: CRP: 6.7 mg/dL — ABNORMAL HIGH (ref <1.0)

## 2022-01-05 LAB — APTT: aPTT: 41 seconds — ABNORMAL HIGH (ref 24–36)

## 2022-01-05 MED ORDER — VANCOMYCIN HCL IN DEXTROSE 1-5 GM/200ML-% IV SOLN
1000.0000 mg | Freq: Once | INTRAVENOUS | Status: AC
  Administered 2022-01-05: 1000 mg via INTRAVENOUS
  Filled 2022-01-05: qty 200

## 2022-01-05 MED ORDER — ONDANSETRON HCL 4 MG/2ML IJ SOLN: 4.0000 mg | Freq: Four times a day (QID) | INTRAMUSCULAR | Status: DC | PRN

## 2022-01-05 MED ORDER — ONDANSETRON HCL 4 MG PO TABS: 4.0000 mg | ORAL_TABLET | Freq: Four times a day (QID) | ORAL | Status: DC | PRN

## 2022-01-05 MED ORDER — ENOXAPARIN SODIUM 40 MG/0.4ML IJ SOSY
40.0000 mg | PREFILLED_SYRINGE | INTRAMUSCULAR | Status: DC
  Filled 2022-01-05 (×5): qty 0.4

## 2022-01-05 MED ORDER — OXYCODONE-ACETAMINOPHEN 5-325 MG PO TABS
2.0000 | ORAL_TABLET | Freq: Four times a day (QID) | ORAL | Status: DC | PRN
  Administered 2022-01-05 – 2022-01-10 (×9): 2 via ORAL
  Filled 2022-01-05 (×8): qty 2

## 2022-01-05 MED ORDER — METRONIDAZOLE 500 MG/100ML IV SOLN
500.0000 mg | Freq: Once | INTRAVENOUS | Status: AC
  Administered 2022-01-05: 500 mg via INTRAVENOUS
  Filled 2022-01-05: qty 100

## 2022-01-05 MED ORDER — CEFEPIME HCL 2 G IV SOLR
2.0000 g | Freq: Once | INTRAVENOUS | Status: AC
  Administered 2022-01-05: 2 g via INTRAVENOUS
  Filled 2022-01-05: qty 12.5

## 2022-01-05 MED ORDER — VANCOMYCIN VARIABLE DOSE PER UNSTABLE RENAL FUNCTION (PHARMACIST DOSING): Status: DC

## 2022-01-05 MED ORDER — ATORVASTATIN CALCIUM 40 MG PO TABS
80.0000 mg | ORAL_TABLET | Freq: Every day | ORAL | Status: DC
  Administered 2022-01-05 – 2022-01-11 (×7): 80 mg via ORAL
  Filled 2022-01-05 (×7): qty 2

## 2022-01-05 MED ORDER — METOLAZONE 5 MG PO TABS
5.0000 mg | ORAL_TABLET | Freq: Every day | ORAL | Status: DC
  Administered 2022-01-05 – 2022-01-06 (×2): 5 mg via ORAL
  Filled 2022-01-05 (×2): qty 1

## 2022-01-05 MED ORDER — SODIUM CHLORIDE 0.9 % IV SOLN
2.0000 g | Freq: Two times a day (BID) | INTRAVENOUS | Status: DC
  Filled 2022-01-05: qty 12.5

## 2022-01-05 MED ORDER — ALBUTEROL SULFATE (2.5 MG/3ML) 0.083% IN NEBU: 2.5000 mg | INHALATION_SOLUTION | RESPIRATORY_TRACT | Status: DC | PRN

## 2022-01-05 MED ORDER — VANCOMYCIN HCL 1250 MG/250ML IV SOLN: 1250.0000 mg | INTRAVENOUS | Status: DC

## 2022-01-05 MED ORDER — SODIUM CHLORIDE 0.9 % IV SOLN
2.0000 g | INTRAVENOUS | Status: AC
  Administered 2022-01-05 – 2022-01-08 (×4): 2 g via INTRAVENOUS
  Filled 2022-01-05 (×6): qty 20

## 2022-01-05 MED ORDER — OXYCODONE-ACETAMINOPHEN 5-325 MG PO TABS
1.0000 | ORAL_TABLET | Freq: Four times a day (QID) | ORAL | Status: DC | PRN
  Administered 2022-01-08 – 2022-01-10 (×4): 1 via ORAL
  Filled 2022-01-05 (×6): qty 1

## 2022-01-05 MED ORDER — POLYETHYLENE GLYCOL 3350 17 G PO PACK: 17.0000 g | PACK | Freq: Every day | ORAL | Status: DC | PRN

## 2022-01-05 MED ORDER — FUROSEMIDE 10 MG/ML IJ SOLN: 60.0000 mg | Freq: Two times a day (BID) | INTRAMUSCULAR | Status: DC

## 2022-01-05 MED ORDER — ACETAMINOPHEN 325 MG PO TABS
650.0000 mg | ORAL_TABLET | Freq: Four times a day (QID) | ORAL | Status: DC | PRN
  Administered 2022-01-05 – 2022-01-10 (×2): 650 mg via ORAL
  Filled 2022-01-05 (×2): qty 2

## 2022-01-05 MED ORDER — FUROSEMIDE 10 MG/ML IJ SOLN
80.0000 mg | Freq: Three times a day (TID) | INTRAMUSCULAR | Status: DC
  Administered 2022-01-05 – 2022-01-06 (×3): 80 mg via INTRAVENOUS
  Filled 2022-01-05 (×3): qty 8

## 2022-01-05 MED ORDER — ACETAMINOPHEN 650 MG RE SUPP: 650.0000 mg | Freq: Four times a day (QID) | RECTAL | Status: DC | PRN

## 2022-01-05 MED ORDER — SODIUM CHLORIDE 0.9 % IV SOLN
500.0000 mg | INTRAVENOUS | Status: DC
  Administered 2022-01-05 – 2022-01-06 (×2): 500 mg via INTRAVENOUS
  Filled 2022-01-05 (×2): qty 5

## 2022-01-05 NOTE — Assessment & Plan Note (Addendum)
   Patient presenting with evidence of tachycardia, fever and leukocytosis in the setting of what appears to be a multifocal left lower lobe pneumonia pneumonia on CT imaging of the chest concerning for early sepsis  Treating patient with intravenous antibiotics with ceftriaxone and azithromycin  Blood cultures obtained  Procalcitonin elevated consistent with bacterial infection  Recheck bmet in AM

## 2022-01-05 NOTE — Assessment & Plan Note (Addendum)
   Substantial anasarca noted on exam with pitting edema from the feet all the way through to the head.  Patient does not appear to be compliant with his ARB in the outpatient setting  Initially continued on 60mg  IV bid lasix  Nephrology consulted, now on lasix 80mg  TID  Recheck bmet in AM

## 2022-01-05 NOTE — Assessment & Plan Note (Signed)
·   Please see assessment and plan above °

## 2022-01-05 NOTE — Consult Note (Signed)
Renal Service Consult Note [facility]  Philip Trevino 01/05/2022 [name], MD Requesting Physician: Dr. Wyline Copas  Reason for Consult: Renal failure, nephrotic syndrome HPI: The patient is a 26 y.o. year-old w/ hx of CKD and nephrotic syndrome w/ 1st bx showed MCD in 2020 and 2nd bx showed FSG in 2021. Is now f/b Dr Hollie Salk. Last was in may 2023 when pt was diuresed for about 7 days w/ high dose IV lasix and zaroxolyn and wts improved from 212 to 182 lbs. Home diuretic was switched from lasix 80 bid to torsemide 80 bid at dc. Baseline creat mid 2s. Takes losartan. Pt admitted now w/ PNA and vol overload. Asked to see for nephrotic syndrome.   Pt seen in room, hx as above. Usual weights prior to dx of neph syndrome 3 yrs ago were around 155- 160.  Today is 182 lbs.     ROS - denies CP, no joint pain, no HA, no blurry vision, no rash, no diarrhea, no nausea/ vomiting, no dysuria, no difficulty voiding   Past Medical History  Past Medical History:  Diagnosis Date   Nephrotic syndrome    Past Surgical History  Past Surgical History:  Procedure Laterality Date   IR FLUORO GUIDE CV LINE RIGHT  01/31/2020   IR US GUIDE VASC ACCESS RIGHT  01/31/2020   TONSILLECTOMY     VIDEO ASSISTED THORACOSCOPY (VATS)/EMPYEMA Left 08/20/2021   Procedure: VIDEO ASSISTED THORACOSCOPY (VATS)/EMPYEMA;  Surgeon: Dahlia Byes, MD;  Location: [facility] OR;  Service: Thoracic;  Laterality: Left;   Family History  Family History  Problem Relation Age of Onset   Healthy Mother    Healthy Father    Heart disease Neg Hx    Social History  reports that he has been smoking. He has never used smokeless tobacco. He reports that he does not currently use alcohol. He reports current drug use. Drug: Marijuana. Allergies  Allergies  Allergen Reactions   Broccoli [Brassica Oleracea] Hives    CAULIFLOWER (allergic, also)   Home medications Prior to Admission medications   Medication Sig Start Date End  Date Taking? Authorizing Provider  aspirin 81 MG chewable tablet Chew 1 tablet (81 mg total) by mouth daily. 12/07/21  Yes Charlynne Cousins, MD  furosemide (LASIX) 80 MG tablet Take 80 mg by mouth 2 (two) times daily.   Yes [provider]  losartan (COZAAR) 25 MG tablet Take 1 tablet (25 mg total) by mouth daily. 12/07/21  Yes Charlynne Cousins, MD  acetaminophen (TYLENOL) 500 MG tablet Take 2 tablets (1,000 mg total) by mouth every 6 (six) hours. Patient not taking: Reported on 11/30/2021 08/25/21   Phillips Grout, MD  atorvastatin (LIPITOR) 80 MG tablet Take 1 tablet (80 mg total) by mouth daily. Patient not taking: Reported on 10/25/2021 10/04/20   Dessa Phi, DO  ezetimibe (ZETIA) 10 MG tablet Take 1 tablet (10 mg total) by mouth daily. Patient not taking: Reported on 10/25/2021 10/04/20   Dessa Phi, DO  gabapentin (NEURONTIN) 250 MG/5ML solution Take 6 mLs (300 mg total) by mouth at bedtime. Patient not taking: Reported on 10/25/2021 09/10/21   Dahlia Byes, MD  torsemide 40 MG TABS Take 80 mg by mouth 2 (two) times daily. Patient not taking: Reported on 01/04/2022 12/06/21   Charlynne Cousins, MD     Vitals:   01/05/22 0140 01/05/22 0445 01/05/22 0920 01/05/22 1044  BP: (!) 155/101 (!) 151/97 (!) 148/99 137/90  Pulse: (!) 109 Marland Kitchen)  102 91 91  Resp: 20 (!) _0 Temp: (!) 100.4 F (38 C) 99 F (37.2 C) 98.7 F (37.1 C) 98.3 F (36.8 C)  TempSrc: Oral Oral Oral Oral  SpO2: 97% 95% 99% 98%  Weight:       Exam Gen alert, no distress No rash, cyanosis or gangrene Sclera anicteric, throat clear  No jvd or bruits Chest clear bilat to bases, no rales/ wheezing RRR no MRG Abd soft ntnd no mass or ascites +bs GU normal MS no joint effusions or deformity Ext diffuse doughy 1-2+ facial/ UE/ LE edema,  Neuro is alert, Ox 3 , nf      CT abd / chest - LLL consolidation better than last imaging, +some ascites and anasarca.   CT abd - Adrenals/Urinary Tract:  Normal adrenal glands. Symmetric, nonobstructed kidneys. No nephrolithiasis. Ureters appear nondilated. Unremarkable bladder.  CXr - no edema   Home meds include - aspirin, losartan 25 qd, atorvastatin, ezetimibe, gabapentin, torsemide 80 bid     Date   Creat  eGFR    2019- 2020  0.9- 1.0    2021   2.42 >> 0.95  AKI episode    March 2022  3.10 >> 1.88  Aki, ckd     07/30/21  2.29  40 ml/min    1/24- 08/25/21  6.41 >> 1.85 Aki/ ckd, 12- 52 ml/min    5/11- 12/05/21 2.23 >> 1.80 Aki/ ckd,  39- 53 ml/min    6/16   2.47     6/17   2.52  35 ml/min     Assessment/ Plan: AKI on CKD 3a - b/l creat 1.8- 2.2 from jan- may 2023, eGFR 40- 53 ml/min. Creat here 2.5 on admission 6/16 in setting of PNA and sepsis. Will follow creatinine w/ diuresis. BP's high. UA mild rbc/ wbcs and heavy proteinuria. CT shows normal kidneys w/o obstruction. Supportive care. Cont losartan as long as creat stable w/ diuresis.  Vol overload - start IV lasix 80 tid and metolazone 5 qd. Last admit max IV lasix was 120 iv tid.  Nephrotic syndrome - hx MCD > FSG (sp biopsy x 2). Albumin remains < 1.5. Cont losartan.   PNA - w/ cough, fever and mild LLL consolidation by CT. Is getting IV antibiotics, per pmd    Kelly Splinter  MD 01/05/2022, 11:52 AM   Recent Labs  Lab 01/04/22 2128 01/05/22 0526  HGB 8.9* 8.6*  ALBUMIN <1.5* <1.5*  CALCIUM 6.7* 6.6*  CREATININE 2.47* 2.52*  K 3.5 3.5

## 2022-01-05 NOTE — Assessment & Plan Note (Addendum)
   In setting of FSGS  Providing patient with diuretics due to profound anasarca, will monitor to see if creatinine paradoxically improves with diuresis  Strict input and output monitoring  Monitoring renal function and electrolytes with serial chemistries  Have consulted Nephrology for assistance

## 2022-01-05 NOTE — Sepsis Progress Note (Signed)
Elink following Code sepsis  

## 2022-01-05 NOTE — Assessment & Plan Note (Signed)
   Resuming home regimen of atorvastatin.

## 2022-01-05 NOTE — ED Provider Notes (Signed)
[place] DEPT Provider Note   CSN: [id number] Arrival date & time: 01/04/22  2032     History  Chief Complaint  Patient presents with   Facial Swelling    Philip Trevino is a 26 y.o. male.  HPI  26 year old male with past medical history of nephrotic syndrome, anasarca, noncompliance presents emergency department with facial swelling, fever, nausea and back pain.  Patient is supposed to be on torsemide, as pharmacy was unable to provide this, he has continued with his Lasix dose.  Developed a fever today along with the above symptoms and came in for evaluation.  Main complaint is mid back pain and body aches.  Patient denies any cough.  He has had intermittent diarrhea but no bloody stools.  No dysuria.  Home Medications Prior to Admission medications   Medication Sig Start Date End Date Taking? Authorizing Provider  aspirin 81 MG chewable tablet Chew 1 tablet (81 mg total) by mouth daily. 12/07/21  Yes Charlynne Cousins, MD  furosemide (LASIX) 80 MG tablet Take 80 mg by mouth 2 (two) times daily.   Yes [provider]  losartan (COZAAR) 25 MG tablet Take 1 tablet (25 mg total) by mouth daily. 12/07/21  Yes Charlynne Cousins, MD  acetaminophen (TYLENOL) 500 MG tablet Take 2 tablets (1,000 mg total) by mouth every 6 (six) hours. Patient not taking: Reported on 11/30/2021 08/25/21   Phillips Grout, MD  atorvastatin (LIPITOR) 80 MG tablet Take 1 tablet (80 mg total) by mouth daily. Patient not taking: Reported on 10/25/2021 10/04/20   Dessa Phi, DO  ezetimibe (ZETIA) 10 MG tablet Take 1 tablet (10 mg total) by mouth daily. Patient not taking: Reported on 10/25/2021 10/04/20   Dessa Phi, DO  gabapentin (NEURONTIN) 250 MG/5ML solution Take 6 mLs (300 mg total) by mouth at bedtime. Patient not taking: Reported on 10/25/2021 09/10/21   Dahlia Byes, MD  torsemide 40 MG TABS Take 80 mg by mouth 2 (two) times daily. Patient not taking:  Reported on 01/04/2022 12/06/21   Charlynne Cousins, MD      Allergies    Eual Fines Marta Lamas oleracea]    Review of Systems   Review of Systems  Constitutional:  Positive for chills, fatigue and fever.  Respiratory:  Positive for shortness of breath.   Cardiovascular:  Negative for chest pain.  Gastrointestinal:  Positive for diarrhea, nausea and vomiting. Negative for abdominal pain.  Musculoskeletal:  Positive for back pain and myalgias.  Skin:  Negative for rash.  Neurological:  Negative for headaches.    Physical Exam Updated Vital Signs BP (!) 154/97   Pulse (!) 114   Temp (!) 101.2 F (38.4 C) (Oral)   Resp (!) 26   SpO2 99%  Physical Exam Vitals and nursing note reviewed.  Constitutional:      Appearance: Normal appearance.     Comments: Facial edema  HENT:     Head: Normocephalic.     Mouth/Throat:     Mouth: Mucous membranes are moist.  Eyes:     Comments: Peri orbital edema  Cardiovascular:     Rate and Rhythm: Tachycardia present.  Pulmonary:     Effort: Pulmonary effort is normal. No respiratory distress.  Abdominal:     Palpations: Abdomen is soft.     Tenderness: There is no abdominal tenderness. There is no guarding or rebound.  Musculoskeletal:     Cervical back: No rigidity.  Skin:    General: Skin is  warm.  Neurological:     Mental Status: He is alert and oriented to person, place, and time. Mental status is at baseline.  Psychiatric:        Mood and Affect: Mood normal.     ED Results / Procedures / Treatments   Labs (all labs ordered are listed, but only abnormal results are displayed) Labs Reviewed  COMPREHENSIVE METABOLIC PANEL - Abnormal; Notable for the following components:      Result Value   Chloride 116 (*)    CO2 21 (*)    BUN 29 (*)    Creatinine, Ser 2.47 (*)    Calcium 6.7 (*)    Total Protein 3.5 (*)    Albumin <1.5 (*)    GFR, Estimated 36 (*)    Anion gap 3 (*)    All other components within normal limits  CBC  WITH DIFFERENTIAL/PLATELET - Abnormal; Notable for the following components:   WBC 15.2 (*)    RBC 3.15 (*)    Hemoglobin 8.9 (*)    HCT 26.4 (*)    Neutro Abs 13.2 (*)    All other components within normal limits  RESP PANEL BY RT-PCR (FLU A&B, COVID) ARPGX2  CULTURE, BLOOD (SINGLE)  LACTIC ACID, PLASMA  URINALYSIS, ROUTINE W REFLEX MICROSCOPIC    EKG EKG Interpretation  Date/Time:  Friday January 04 2022 21:39:48 EDT Ventricular Rate:  120 PR Interval:  125 QRS Duration: 91 QT Interval:  297 QTC Calculation: 420 R Axis:   63 Text Interpretation: Sinus tachycardia Abnrm T, consider ischemia, anterolateral lds Confirmed by Lavenia Atlas [phone]) on 01/04/2022 10:47:16 PM  Radiology DG Chest Port 1 View  Result Date: 01/04/2022 CLINICAL DATA:  Weakness, nephrotic syndrome EXAM: PORTABLE CHEST 1 VIEW COMPARISON:  11/29/2021 FINDINGS: Small left pleural effusion is present, decreased in size since prior examination. Mild left basilar associated atelectasis. Lungs are otherwise clear. No pneumothorax. No pleural effusion on the right. Cardiac size within normal limits. IMPRESSION: Small left pleural effusion, decreased in size since prior examination. Electronically Signed   By: Fidela Salisbury M.D.   On: 01/04/2022 22:53    Procedures .Critical Care  Performed by: Lorelle Gibbs, DO Authorized by: Lorelle Gibbs, DO   Critical care provider statement:    Critical care time (minutes):  45   Critical care time was exclusive of:  Separately billable procedures and treating other patients   Critical care was necessary to treat or prevent imminent or life-threatening deterioration of the following conditions:  Sepsis   Critical care was time spent personally by me on the following activities:  Development of treatment plan with patient or surrogate, discussions with consultants, evaluation of patient's response to treatment, examination of patient, ordering and review of laboratory  studies, ordering and review of radiographic studies, ordering and performing treatments and interventions, pulse oximetry, re-evaluation of patient's condition and review of old charts   I assumed direction of critical care for this patient from another provider in my specialty: no     Care discussed with: admitting provider       Medications Ordered in ED Medications  lactated ringers infusion ( Intravenous New Bag/Given 01/04/22 2323)  ceFEPIme (MAXIPIME) 2 g in sodium chloride 0.9 % 100 mL IVPB (has no administration in time range)  metroNIDAZOLE (FLAGYL) IVPB 500 mg (has no administration in time range)  vancomycin (VANCOCIN) IVPB 1000 mg/200 mL premix (1,000 mg Intravenous New Bag/Given 01/05/22 0016)  lactated ringers bolus 1,000 mL (1,000  mLs Intravenous Not Given 01/04/22 2348)    And  lactated ringers bolus 1,000 mL (1,000 mLs Intravenous New Bag/Given 01/04/22 2322)    And  lactated ringers bolus 500 mL (500 mLs Intravenous New Bag/Given 01/04/22 2323)  acetaminophen (TYLENOL) tablet 1,000 mg (1,000 mg Oral Given 01/04/22 2322)  morphine (PF) 4 MG/ML injection 4 mg (4 mg Intravenous Given 01/04/22 2313)  ondansetron (ZOFRAN) injection 4 mg (4 mg Intravenous Given 01/04/22 2314)    ED Course/ Medical Decision Making/ A&P                           Medical Decision Making Amount and/or Complexity of Data Reviewed Labs: ordered. Radiology: ordered.  Risk OTC drugs. Prescription drug management. Decision regarding hospitalization.   26 year old male presents emergency department with fever, back pain, nausea/vomiting.  Of note patient has history of nephrotic syndrome, noncompliance, anasarca.  Admission earlier this year in January/February 2023 with bilateral empyemas that required chest tubes.  Recent admission for anasarca, improved with diuresis.  Patient states has been declining since recent discharge.  He continues on Lasix but has not obtained outpatient torsemide.  On  arrival he is febrile, tachycardic, tachypneic.  Abdomen is reported to be diffusely uncomfortable but benign on exam.  Blood work shows a mild leukocytosis of 15, worsening kidney dysfunction with decreased GFR.  Lactic is normal.  Chest x-ray shows trace pleural effusion.  After pain medicine patient looks significantly improved, abdominal exam is soft with no point tenderness.  Code sepsis placed, IV hydration started.  We will plan to treat for sepsis of unknown source.  Viral panel is pending, patient has not been able to give a urine sample at this time.  We will plan for medical admission and IV antibiotics.  Patient is pending urinalysis.  If this is negative will anticipate CT of the chest/abdomen/pelvis with IV contrast.  If this shows any signs of infection/blood we will plan for CT renal study.  Patient admitted on IV antibiotics for further work-up and care.  Patients evaluation and results requires admission for further treatment and care.  Spoke with hospitalist Dr. Cyd Silence, reviewed patient's ED course and they accept admission.  Patient agrees with admission plan, offers no new complaints and is stable/unchanged at time of admit.        Final Clinical Impression(s) / ED Diagnoses Final diagnoses:  None    Rx / DC Orders ED Discharge Orders     None         Lorelle Gibbs, DO 01/05/22 8250

## 2022-01-05 NOTE — ED Notes (Signed)
ED TO INPATIENT HANDOFF REPORT  Name/Age/Gender Philip Trevino 26 y.o. male  Code Status Code Status History     Date Active Date Inactive Code Status Order ID Comments User Context   11/30/2021 0322 12/06/2021 2254 Full Code [id number]  Rise Patience, MD ED   08/14/2021 1756 08/25/2021 1807 Full Code [id number]  Nolberto Hanlon, MD ED   09/27/2020 1652 10/03/2020 1644 Full Code [id number]  Harold Hedge, MD ED   01/28/2020 0517 02/02/2020 1912 Full Code [id number]  Rise Patience, MD ED       Home/SNF/Other Home  Chief Complaint Sepsis [facility]) [A41.9]  Level of Care/Admitting Diagnosis ED Disposition     ED Disposition  Admit   Condition  --   Comment  Hospital Area: [facility] [[id number]]  Level of Care: Telemetry [5]  Admit to tele based on following criteria: Other see comments  Comments: sepsis, tachycardia  May place patient in observation at [facility] or [place] if equivalent level of care is available:: No  Covid Evaluation: Asymptomatic - no recent exposure (last 10 days) testing not required  Diagnosis: Sepsis [facility]) [[id number]]  Admitting Physician: Vernelle Emerald [[id number]]  Attending Physician: Vernelle Emerald [[id number]]          Medical History Past Medical History:  Diagnosis Date   Nephrotic syndrome     Allergies Allergies  Allergen Reactions   Broccoli [Brassica Oleracea] Hives    CAULIFLOWER (allergic, also)    IV Location/Drains/Wounds Patient Lines/Drains/Airways Status     Active Line/Drains/Airways     Name Placement date Placement time Site Days   Peripheral IV 12/03/21 22 G Anterior;Proximal;Right Forearm 12/03/21  2200  Forearm  33   Peripheral IV 01/04/22 20 G Left;Posterior Hand 01/04/22  2200  Hand  1            Labs/Imaging Results for orders placed or performed during the hospital encounter of 01/04/22 (from the past 48 hour(s))  Comprehensive metabolic panel     Status: Abnormal    Collection Time: 01/04/22  9:28 PM  Result Value Ref Range   Sodium 140 135 - 145 mmol/L   Potassium 3.5 3.5 - 5.1 mmol/L   Chloride 116 (H) 98 - 111 mmol/L   CO2 21 (L) 22 - 32 mmol/L   Glucose, Bld 89 70 - 99 mg/dL    Comment: Glucose reference range applies only to samples taken after fasting for at least 8 hours.   BUN 29 (H) 6 - 20 mg/dL   Creatinine, Ser 2.47 (H) 0.61 - 1.24 mg/dL   Calcium 6.7 (L) 8.9 - 10.3 mg/dL   Total Protein 3.5 (L) 6.5 - 8.1 g/dL   Albumin <1.5 (L) 3.5 - 5.0 g/dL   AST 19 15 - 41 U/L   ALT 8 0 - 44 U/L   Alkaline Phosphatase 60 38 - 126 U/L   Total Bilirubin 0.3 0.3 - 1.2 mg/dL   GFR, Estimated 36 (L) >60 mL/min    Comment: (NOTE) Calculated using the CKD-EPI Creatinine Equation (2021)    Anion gap 3 (L) 5 - 15    Comment: Performed at [facility], [place] [address] [place], [place] [address]  CBC with Differential     Status: Abnormal   Collection Time: 01/04/22  9:28 PM  Result Value Ref Range   WBC 15.2 (H) 4.0 - 10.5 K/uL   RBC 3.15 (L) 4.22 - 5.81 MIL/uL   Hemoglobin 8.9 (L) 13.0 -  17.0 g/dL   HCT 26.4 (L) 39.0 - 52.0 %   MCV 83.8 80.0 - 100.0 fL   MCH 28.3 26.0 - 34.0 pg   MCHC 33.7 30.0 - 36.0 g/dL   RDW 13.2 11.5 - 15.5 %   Platelets 379 150 - 400 K/uL   nRBC 0.0 0.0 - 0.2 %   Neutrophils Relative % 87 %   Neutro Abs 13.2 (H) 1.7 - 7.7 K/uL   Lymphocytes Relative 6 %   Lymphs Abs 1.0 0.7 - 4.0 K/uL   Monocytes Relative 5 %   Monocytes Absolute 0.7 0.1 - 1.0 K/uL   Eosinophils Relative 1 %   Eosinophils Absolute 0.2 0.0 - 0.5 K/uL   Basophils Relative 1 %   Basophils Absolute 0.1 0.0 - 0.1 K/uL   Immature Granulocytes 0 %   Abs Immature Granulocytes 0.06 0.00 - 0.07 K/uL    Comment: Performed at [facility], [place] [address], [place], [place] [address]  Lactic acid, plasma     Status: None   Collection Time: 01/04/22 10:26 PM  Result Value Ref Range   Lactic Acid, Venous 1.2 0.5 - 1.9 mmol/L     Comment: Performed at [facility], [place] [address], [place], [place] [address]  Resp Panel by RT-PCR (Flu A&B, Covid) Anterior Nasal Swab     Status: None   Collection Time: 01/04/22 11:35 PM   Specimen: Anterior Nasal Swab  Result Value Ref Range   SARS Coronavirus 2 by RT PCR NEGATIVE NEGATIVE    Comment: (NOTE) SARS-CoV-2 target nucleic acids are NOT DETECTED.  The SARS-CoV-2 RNA is generally detectable in upper respiratory specimens during the acute phase of infection. The lowest concentration of SARS-CoV-2 viral copies this assay can detect is 138 copies/mL. A negative result does not preclude SARS-Cov-2 infection and should not be used as the sole basis for treatment or other patient management decisions. A negative result may occur with  improper specimen collection/handling, submission of specimen other than nasopharyngeal swab, presence of viral mutation(s) within the areas targeted by this assay, and inadequate number of viral copies(<138 copies/mL). A negative result must be combined with clinical observations, patient history, and epidemiological information. The expected result is Negative.  Fact Sheet for Patients:  EntrepreneurPulse.com.au  Fact Sheet for Healthcare Providers:  IncredibleEmployment.be  This test is no t yet approved or cleared by the [place] FDA and  has been authorized for detection and/or diagnosis of SARS-CoV-2 by FDA under an Emergency Use Authorization (EUA). This EUA will remain  in effect (meaning this test can be used) for the duration of the COVID-19 declaration under Section 564(b)(1) of the Act, 21 U.S.C.section 360bbb-3(b)(1), unless the authorization is terminated  or revoked sooner.       Influenza A by PCR NEGATIVE NEGATIVE   Influenza B by PCR NEGATIVE NEGATIVE    Comment: (NOTE) The Xpert Xpress SARS-CoV-2/FLU/RSV plus assay is intended as an aid in the diagnosis  of influenza from Nasopharyngeal swab specimens and should not be used as a sole basis for treatment. Nasal washings and aspirates are unacceptable for Xpert Xpress SARS-CoV-2/FLU/RSV testing.  Fact Sheet for Patients: EntrepreneurPulse.com.au  Fact Sheet for Healthcare Providers: IncredibleEmployment.be  This test is not yet approved or cleared by the [place] FDA and has been authorized for detection and/or diagnosis of SARS-CoV-2 by FDA under an Emergency Use Authorization (EUA). This EUA will remain in effect (meaning this test can be used) for the duration  of the COVID-19 declaration under Section 564(b)(1) of the Act, 21 U.S.C. section 360bbb-3(b)(1), unless the authorization is terminated or revoked.  Performed at [facility], [place] [address], [place], [place] [address]    DG Chest Port 1 View  Result Date: 01/04/2022 CLINICAL DATA:  Weakness, nephrotic syndrome EXAM: PORTABLE CHEST 1 VIEW COMPARISON:  11/29/2021 FINDINGS: Small left pleural effusion is present, decreased in size since prior examination. Mild left basilar associated atelectasis. Lungs are otherwise clear. No pneumothorax. No pleural effusion on the right. Cardiac size within normal limits. IMPRESSION: Small left pleural effusion, decreased in size since prior examination. Electronically Signed   By: Fidela Salisbury M.D.   On: 01/04/2022 22:53    Pending Labs Unresulted Labs (From admission, onward)     Start     Ordered   01/04/22 2246  Culture, blood (single)  (Undifferentiated -> Now sepsis confirmed (treatment and sepsis specific nursing orders))  ONCE - STAT,   STAT        01/04/22 2246   01/04/22 2158  Urinalysis, Routine w reflex microscopic Urine, Clean Catch  Once,   URGENT        01/04/22 2157            Vitals/Pain Today's Vitals   01/04/22 2330 01/05/22 0000 01/05/22 0023 01/05/22 0030  BP: (!) 153/100 (!) 154/97  (!) 154/96   Pulse: (!) 112 (!) 114  (!) 118  Resp: (!) 33 (!) 26  (!) 24  Temp:   99.7 F (37.6 C)   TempSrc:   Oral   SpO2: 99% 99%  95%  Weight:    181 lb 14.1 oz (82.5 kg)    Isolation Precautions No active isolations  Medications Medications  lactated ringers infusion ( Intravenous New Bag/Given 01/04/22 2323)  metroNIDAZOLE (FLAGYL) IVPB 500 mg (500 mg Intravenous New Bag/Given 01/05/22 0017)  ceFEPIme (MAXIPIME) 2 g in sodium chloride 0.9 % 100 mL IVPB (has no administration in time range)  vancomycin variable dose per unstable renal function (pharmacist dosing) (has no administration in time range)  lactated ringers bolus 1,000 mL (0 mLs Intravenous Stopped 01/05/22 0022)    And  lactated ringers bolus 1,000 mL (0 mLs Intravenous Stopped 01/05/22 0022)    And  lactated ringers bolus 500 mL (0 mLs Intravenous Stopped 01/05/22 0022)  acetaminophen (TYLENOL) tablet 1,000 mg (1,000 mg Oral Given 01/04/22 2322)  morphine (PF) 4 MG/ML injection 4 mg (4 mg Intravenous Given 01/04/22 2313)  ondansetron (ZOFRAN) injection 4 mg (4 mg Intravenous Given 01/04/22 2314)  ceFEPIme (MAXIPIME) 2 g in sodium chloride 0.9 % 100 mL IVPB (0 g Intravenous Stopped 01/05/22 0053)  vancomycin (VANCOCIN) IVPB 1000 mg/200 mL premix (1,000 mg Intravenous New Bag/Given 01/05/22 0016)    Mobility walks with person assist

## 2022-01-05 NOTE — Progress Notes (Signed)
Pharmacy Antibiotic Note  Coreyon Nicotra is a 26 y.o. male admitted on 01/04/2022 with past medical history of nephrotic syndrome, anasarca, noncompliance presents emergency department with facial swelling, fever, nausea and back pain.  Pharmacy has been consulted to dose vancomycin and cefepime for sepsis  Plan: Vancomycin 1gm IV x 1 then 1250mg  q24h (AUC 482.9, Scr 2.47) Cefepime 2gm IV q12h Follow renal function, cultures and clinical course  Weight: 82.5 kg (181 lb 14.1 oz)  Temp (24hrs), Avg:100.5 F (38.1 C), Min:99.7 F (37.6 C), Max:101.2 F (38.4 C)  Recent Labs  Lab 01/04/22 2128 01/04/22 2226  WBC 15.2*  --   CREATININE 2.47*  --   LATICACIDVEN  --  1.2    Estimated Creatinine Clearance: 47.2 mL/min (A) (by C-G formula based on SCr of 2.47 mg/dL (H)).    Allergies  Allergen Reactions   Broccoli [Brassica Oleracea] Hives    CAULIFLOWER (allergic, also)    Antimicrobials this admission: 6/17 cefepime >> 6/17 vanc >> 6/17 flagyl x 1  Dose adjustments this admission:   Microbiology results: 6/16 BCx:    Thank you for allowing pharmacy to be a part of this patient's care.  Dolly Rias RPh 01/05/2022, 1:23 AM

## 2022-01-05 NOTE — Hospital Course (Signed)
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 Pt also found to be septic with PNA

## 2022-01-05 NOTE — Progress Notes (Signed)
  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 Pt also found to be septic with PNA  Assessment and Plan: * Sepsis due to pneumonia [facility]) Patient presenting with evidence of tachycardia, fever and leukocytosis in the setting of what appears to be a multifocal left lower lobe pneumonia pneumonia on CT imaging of the chest concerning for early sepsis Treating patient with intravenous antibiotics with ceftriaxone and azithromycin Blood cultures obtained Procalcitonin elevated consistent with bacterial infection Recheck bmet in AM  Pneumonia of left lower lobe due to infectious organism Please see assessment and plan above  Acute renal failure superimposed on stage 3a chronic kidney disease ([place]) In setting of FSGS Providing patient with diuretics due to profound anasarca, will monitor to see if creatinine paradoxically improves with diuresis Strict input and output monitoring Monitoring renal function and electrolytes with serial chemistries Have consulted Nephrology for assistance  Anasarca associated with disorder of kidney Substantial anasarca noted on exam with pitting edema from the feet all the way through to the head. Patient does not appear to be compliant with his ARB in the outpatient setting Initially continued on 60mg  IV bid lasix Nephrology consulted, now on lasix 80mg  TID Recheck bmet in AM  Mixed hyperlipidemia Resuming home regimen of atorvastatin.        Subjective: Still feeling very swollen. Voiding  Physical Exam: Vitals:   01/05/22  0920 01/05/22 1044 01/05/22 1251 01/05/22 1413  BP: (!) 148/99 137/90  116/85  Pulse: 91 91  79  Resp: 20 18    Temp: 98.7 F (37.1 C) 98.3 F (36.8 C)  98.3 F (36.8 C)  TempSrc: Oral Oral  Oral  SpO2: 99% 98%  99%  Weight:      Height:   5' 9.5" (1.765 m)    General exam: Awake, laying in bed, in nad Respiratory system: Normal respiratory effort, no wheezing Cardiovascular system: regular rate, s1, s2 Gastrointestinal system: Soft, nondistended, positive BS Central nervous system: CN2-12 grossly intact, strength intact Extremities: Perfused, no clubbing, generalized anasarca Skin: Normal skin turgor, no notable skin lesions seen Psychiatry: Mood normal // no visual hallucinations   Data Reviewed:  Labs reviewed: Na 139, Cr 252, CRP 6.7, Procal 0.66, Hgb 8.6   Family Communication: Pt in room, family not at bedside  Disposition: Status is: Observation The patient will require care spanning > 2 midnights and should be moved to inpatient because: Severity of illness  Planned Discharge Destination: Home     Author: Marylu Lund, MD 01/05/2022 3:13 PM  For on call review [email or web address].

## 2022-01-05 NOTE — H&P (Signed)
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  Patient explains that several days ago he ran out of his torsemide.  Patient reports that he was provided3 with furosemide instead.  Since then he has been experiencing worsening edema.  Patient symptoms continue to worsen and he decided to present to [facility] emergency department for evaluation.  Upon evaluation in the emergency department patient was found to exhibit multiple SIRS criteria including fever, tachycardia, tachypnea and leukocytosis.  ER provider initiated intravenous vancomycin, cefepime and Flagyl.  Hospitalist group was then called to assess patient for admission to the hospital.  Review of Systems: Review of Systems  Respiratory:  Positive for cough and shortness of breath.   Cardiovascular:  Positive for leg swelling.  All other systems reviewed and are negative.    Past Medical History:  Diagnosis Date   Nephrotic syndrome     Past Surgical History:  Procedure Laterality Date   IR FLUORO GUIDE CV LINE RIGHT  01/31/2020   IR US GUIDE VASC ACCESS RIGHT  01/31/2020   TONSILLECTOMY     VIDEO ASSISTED THORACOSCOPY (VATS)/EMPYEMA Left 08/20/2021   Procedure: VIDEO ASSISTED THORACOSCOPY (VATS)/EMPYEMA;  Surgeon: Dahlia Byes, MD;  Location: [facility] OR;  Service: Thoracic;  Laterality: Left;    Social History:  reports that he has been smoking. He has never used smokeless tobacco. He reports that he  does not currently use alcohol. He reports current drug use. Drug: Marijuana.  Allergies  Allergen Reactions   Broccoli [Brassica Oleracea] Hives    CAULIFLOWER (allergic, also)    Family History  Problem Relation Age of Onset   Healthy Mother    Healthy Father    Heart disease Neg Hx     Prior to Admission medications   Medication Sig Start Date End Date Taking? Authorizing Provider  aspirin 81 MG chewable tablet Chew 1 tablet (81 mg total) by mouth daily. 12/07/21  Yes Charlynne Cousins, MD  furosemide (LASIX) 80 MG tablet Take 80 mg by mouth 2 (two) times daily.   Yes [provider]  losartan (COZAAR) 25 MG tablet Take 1 tablet (25 mg total) by mouth daily. 12/07/21  Yes Charlynne Cousins, MD  acetaminophen (TYLENOL) 500 MG tablet Take 2 tablets (1,000 mg total) by mouth every 6 (six) hours. Patient not taking: Reported on 11/30/2021 08/25/21   Phillips Grout, MD  atorvastatin (LIPITOR) 80 MG tablet Take 1 tablet (80 mg total) by mouth daily. Patient not taking: Reported on 10/25/2021 10/04/20   Dessa Phi, DO  ezetimibe (ZETIA) 10 MG tablet Take 1 tablet (10 mg total) by mouth daily. Patient not taking: Reported on 10/25/2021 10/04/20   Dessa Phi, DO  gabapentin (NEURONTIN) 250 MG/5ML solution Take 6 mLs (300 mg total) by mouth at bedtime. Patient not taking: Reported on 10/25/2021 09/10/21   Dahlia Byes, MD  torsemide 40 MG TABS Take 80 mg by mouth 2 (two) times daily. Patient not taking: Reported on 01/04/2022 12/06/21   Charlynne Cousins,  MD    Physical Exam:  Vitals:   01/05/22 0030 01/05/22 0140 01/05/22 0445 01/05/22 0920  BP: (!) 154/96 (!) 155/101 (!) 151/97 (!) 148/99  Pulse: (!) 118 (!) 109 (!) 102 91  Resp: (!) 24 20 (!) 22 20  Temp:  (!) 100.4 F (38 C) 99 F (37.2 C) 98.7 F (37.1 C)  TempSrc:  Oral Oral Oral  SpO2: 95% 97% 95% 99%  Weight: 82.5 kg       Constitutional: Awake alert and oriented x3, profound anasarca  Skin: no rashes,  no lesions, good skin turgor noted. Eyes: Pupils are equally reactive to light.  No evidence of scleral icterus or conjunctival pallor.  ENMT: Moist mucous membranes noted.  Posterior pharynx clear of any exudate or lesions.   Neck: normal, supple, no masses, no thyromegaly.  No evidence of jugular venous distension.   Respiratory: Coarse breath sounds bilaterally with rales in the bilateral mid and lower fields.  Intermittent mild expiratory wheezing noted.  Increased respiratory effort noted.  No accessory muscle use.  Cardiovascular: Regular rate and rhythm, no murmurs / rubs / gallops.  Extensive bilateral lower extremity pitting edema from the feet all the way up through to the lower back.  2+ pedal pulses. No carotid bruits.  Chest:   Nontender without crepitus or deformity.   Back:   Nontender without crepitus or deformity. Abdomen: Abdomen is soft and nontender.  No evidence of intra-abdominal masses.  Positive bowel sounds noted in all quadrants.   Musculoskeletal: No joint deformity upper and lower extremities. Good ROM, no contractures. Normal muscle tone.  Neurologic: CN 2-12 grossly intact. Sensation intact.  Patient moving all 4 extremities spontaneously.  Patient is following all commands.  Patient is responsive to verbal stimuli.   Psychiatric: Patient exhibits normal mood with appropriate affect.  Patient seems to possess insight as to their current situation.    Data Reviewed:  I have personally reviewed and interpreted labs, imaging.  Significant findings are:  Chest x-ray personally reviewed revealing a small left-sided pleural effusion CBC reveals white blood cell count of 15.2, hemoglobin 8.9, hematocrit 26.4, platelet count of 379 Chemistry revealing sodium 140, potassium 3.5, chloride 116, bicarbonate 21, BUN 29, creatinine 2.47. Urinalysis revealing cloudy urine with 11-20 white blood cells per high-powered field and rare bacteria.  EKG: Personally reviewed.  Rhythm  is sinus tachycardia with heart rate of 120 bpm.  No dynamic ST segment changes appreciated.   Assessment and Plan: * Sepsis due to pneumonia [facility]) Patient presenting with evidence of tachycardia, fever and leukocytosis in the setting of what appears to be a multifocal left lower lobe pneumonia pneumonia on CT imaging of the chest concerning for early sepsis Treating patient with intravenous antibiotics with ceftriaxone and azithromycin Patient received substantial volume resuscitation by emergency department overnight.  I actually believe that patient is volume overloaded with substantial evidence of anasarca on exam and therefore fluids will be discontinued Blood cultures obtained Procalcitonin elevated consistent with bacterial infection Supplemental oxygen for bouts of hypoxia As needed bronchodilator therapy  Pneumonia of left lower lobe due to infectious organism Please see assessment and plan above  Acute renal failure superimposed on stage 3a chronic kidney disease ([place]) Unclear etiology of acute kidney injury Patient does not appear volume depleted and in fact appears volume overloaded Does not appear to be medication induced Providing patient with diuretics due to profound anasarca, will monitor to see if creatinine paradoxically improves with diuresis Strict input and output  monitoring Monitoring renal function and electrolytes with serial chemistries  Anasarca associated with disorder of kidney Substantial anasarca noted on exam with pitting edema from the feet all the way through to the head. Patient does not appear to be compliant with his ARB in the outpatient setting Discontinuing fluids administered in the emergency department and instead initiating a modest regimen of Lasix 60 mg IV twice daily.  This will be uptitrated as necessary to achieve adequate urine output. Monitoring for symptomatic improvement  Mixed hyperlipidemia Resuming home regimen of  atorvastatin.       Code Status:  Full code  code status decision has been confirmed with: patient   Consults: None  Severity of Illness:  The appropriate patient status for this patient is OBSERVATION. Observation status is judged to be reasonable and necessary in order to provide the required intensity of service to ensure the patient's safety. The patient's presenting symptoms, physical exam findings, and initial radiographic and laboratory data in the context of their medical condition is felt to place them at decreased risk for further clinical deterioration. Furthermore, it is anticipated that the patient will be medically stable for discharge from the hospital within 2 midnights of admission.   Author:  Vernelle Emerald MD  01/05/2022 9:54 AM

## 2022-01-06 DIAGNOSIS — J189 Pneumonia, unspecified organism: Secondary | ICD-10-CM | POA: Diagnosis not present

## 2022-01-06 DIAGNOSIS — N049 Nephrotic syndrome with unspecified morphologic changes: Secondary | ICD-10-CM | POA: Diagnosis not present

## 2022-01-06 DIAGNOSIS — A419 Sepsis, unspecified organism: Secondary | ICD-10-CM | POA: Diagnosis not present

## 2022-01-06 DIAGNOSIS — E782 Mixed hyperlipidemia: Secondary | ICD-10-CM | POA: Diagnosis not present

## 2022-01-06 LAB — COMPREHENSIVE METABOLIC PANEL
ALT: 8 U/L (ref 0–44)
AST: 15 U/L (ref 15–41)
Albumin: <1.5 g/dL — ABNORMAL LOW (ref 3.5–5.0)
Alkaline Phosphatase: 51 U/L (ref 38–126)
Anion gap: 4 — ABNORMAL LOW (ref 5–15)
BUN: 30 mg/dL — ABNORMAL HIGH (ref 6–20)
CO2: 23 mmol/L (ref 22–32)
Calcium: 6.6 mg/dL — ABNORMAL LOW (ref 8.9–10.3)
Chloride: 111 mmol/L (ref 98–111)
Creatinine, Ser: 3.21 mg/dL — ABNORMAL HIGH (ref 0.61–1.24)
GFR, Estimated: 26 mL/min — ABNORMAL LOW (ref >60)
Glucose, Bld: 89 mg/dL (ref 70–99)
Potassium: 3.7 mmol/L (ref 3.5–5.1)
Sodium: 138 mmol/L (ref 135–145)
Total Bilirubin: 0.3 mg/dL (ref 0.3–1.2)
Total Protein: 3.1 g/dL — ABNORMAL LOW (ref 6.5–8.1)

## 2022-01-06 LAB — CBC
HCT: 23.8 % — ABNORMAL LOW (ref 39.0–52.0)
Hemoglobin: 8 g/dL — ABNORMAL LOW (ref 13.0–17.0)
MCH: 28.5 pg (ref 26.0–34.0)
MCHC: 33.6 g/dL (ref 30.0–36.0)
MCV: 84.7 fL (ref 80.0–100.0)
Platelets: 294 10*3/uL (ref 150–400)
RBC: 2.81 MIL/uL — ABNORMAL LOW (ref 4.22–5.81)
RDW: 13.3 % (ref 11.5–15.5)
WBC: 7.7 10*3/uL (ref 4.0–10.5)
nRBC: 0 % (ref 0.0–0.2)

## 2022-01-06 LAB — UREA NITROGEN, URINE: Urea Nitrogen, Ur: 453 mg/dL

## 2022-01-06 MED ORDER — FUROSEMIDE 10 MG/ML IJ SOLN
100.0000 mg | Freq: Three times a day (TID) | INTRAVENOUS | Status: DC
  Filled 2022-01-06 (×2): qty 10

## 2022-01-06 MED ORDER — AZITHROMYCIN 250 MG PO TABS
500.0000 mg | ORAL_TABLET | Freq: Every day | ORAL | Status: AC
  Administered 2022-01-07 – 2022-01-09 (×3): 500 mg via ORAL
  Filled 2022-01-06 (×3): qty 2

## 2022-01-06 NOTE — Progress Notes (Signed)
[facility] Progress Note  Subjective: UOP 1850 cc yest. Had 3 L bolus in ED so I/O net are still 2 L +. Today's labs show. No c/o's other than "I wish they woundn't have given me all that fluid in the ED".   Vitals:   01/05/22 1413 01/05/22 1809 01/05/22 2207 01/06/22 0612  BP: 116/85 (!) 133/91 119/85 124/89  Pulse: 79 84 82 72  Resp:  '17 18 16  ' Temp: 98.3 F (36.8 C) 98.3 F (36.8 C) 98.9 F (37.2 C) 98.1 F (36.7 C)  TempSrc: Oral Oral  Oral  SpO2: 99% 100% 100% 99%  Weight:      Height:        Exam: Gen alert, no distress No jvd or bruits Chest clear bilat to bases RRR no MRG Abd soft ntnd no mass or ascites +bs Ext diffuse doughy 1-2+ facial/ UE/ LE edema,  Neuro is alert, Ox 3 , nf       CT abd / chest - LLL consolidation better than last imaging, +some ascites and anasarca.   CT abd - Adrenals/Urinary Tract: Normal adrenal glands. Symmetric, nonobstructed kidneys. No nephrolithiasis. Ureters appear nondilated. Unremarkable bladder.  CXr - no edema    Home meds include - aspirin, losartan 25 qd, atorvastatin, ezetimibe, gabapentin, torsemide 80 bid      Date                          Creat               eGFR    2019- 2020               0.9- 1.0    2021                         2.42 >> 0.95    AKI episode    March 2022              3.10 >> 1.88    Aki, ckd     07/30/21                     2.29                 40 ml/min    1/24- 08/25/21              6.41 >> 1.85    Aki/ ckd, 12- 52 ml/min    5/11- 12/05/21            2.23 >> 1.80    Aki/ ckd,  39- 53 ml/min    6/16                          2.47         6/17                          2.52                 35 ml/min        Assessment/ Plan: AKI on CKD 3a - b/l creat 1.8- 2.2 from jan- may 2023, eGFR 40- 53 ml/min. FSG w/ severe neph syndrome. Creat here 2.5 on admission 6/16 in setting of PNA and sepsis. BP's high on admission, lower today. Takes losartan at home, holding here. UA mild rbc/ wbcs and  heavy  proteinuria. CT shows normal kidneys w/o obstruction. We started high dose IV lasix 100 tid yesterday for diuresis. However, creat up today to 3.21, a significant bump. Have dc'd diuretics for now. Consider re-attempting IV diuretics at lower dose when creat stable, vs this is baseline and go back to po diuretics.  Vol overload - May 2023 admit was diuresed 212 lbs to 182 lbs. Holding diuretics today as creat bumped ^^ too high.  PNA - w/ cough, fever and mild LLL consolidation by CT. Got a loading dose of IV vanc but is now just getting azithro/ rocephin IV.           Rob Leticia Mcdiarmid 01/06/2022, 6:35 AM   Recent Labs  Lab 01/04/22 2128 01/05/22 0526 01/06/22 0459  HGB 8.9* 8.6* 8.0*  ALBUMIN <1.5* <1.5*  --   CALCIUM 6.7* 6.6*  --   CREATININE 2.47* 2.52*  --   K 3.5 3.5  --    Inpatient medications:  atorvastatin  80 mg Oral Daily   enoxaparin (LOVENOX) injection  40 mg Subcutaneous Q24H   furosemide  80 mg Intravenous Q8H   metolazone  5 mg Oral Daily    azithromycin Stopped (01/05/22 1151)   cefTRIAXone (ROCEPHIN)  IV Stopped (01/05/22 1301)   acetaminophen **OR** acetaminophen, albuterol, ondansetron **OR** ondansetron (ZOFRAN) IV, oxyCODONE-acetaminophen **OR** oxyCODONE-acetaminophen, polyethylene glycol

## 2022-01-06 NOTE — Progress Notes (Signed)
  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 Pt also found to be septic with PNA  Assessment and Plan: * Sepsis due to pneumonia present on admit Patient presenting with evidence of tachycardia, fever and leukocytosis in the setting of what appears to be a multifocal left lower lobe pneumonia pneumonia on CT Treating patient with intravenous antibiotics with ceftriaxone and azithromycin Blood cultures neg thus far Procalcitonin elevated consistent with bacterial infection Recheck bmet in AM  Pneumonia of left lower lobe due to infectious organism Plan per above  Acute renal failure superimposed on stage 3a chronic kidney disease ([place]) In setting of FSGS Strict input and output monitoring Appreciate assistance by Nephrology. Given trial of IV lasix  Cr up to 3.21. Discussed with Nephrology, recs to hold diuretic for today, reassess in AM  Anasarca associated with disorder of kidney Substantial anasarca noted on exam with pitting edema from the feet all the way through to the head. Patient does not appear to be compliant with his ARB in the outpatient setting Given trial of 100mg  TID lasix, however Cr is up to 3.21, thus diuretic held for now [place] in AM  Mixed hyperlipidemia Resuming home regimen of atorvastatin.      Subjective: States swelling has improved  Physical Exam: Vitals:   01/05/22 2207 01/06/22 0612 01/06/22 0658 01/06/22 1409  BP: 119/85 124/89  133/87  Pulse: 82 72  73  Resp: 18 16  20   Temp: 98.9 F (37.2 C) 98.1 F (36.7  C)  98.7 F (37.1 C)  TempSrc:  Oral  Oral  SpO2: 100% 99%  99%  Weight:   93.4 kg   Height:       General exam: Conversant, in no acute distress Respiratory system: normal chest rise, clear, no audible wheezing Cardiovascular system: regular rhythm, s1-s2 Gastrointestinal system: Nondistended, nontender, pos BS Central nervous system: No seizures, no tremors Extremities: No cyanosis, no joint deformities Skin: No rashes, no pallor, continued anasarca, somewhat improved Psychiatry: Affect normal // no auditory hallucinations    Data Reviewed:  Labs reviewed: Na 138, Cr 3.21   Family Communication: Pt in room, family at bedside  Disposition: Status is: Inpatient Continue inpatient stay because: Severity of illness  Planned Discharge Destination: Home     Author: Marylu Lund, MD 01/06/2022 4:37 PM  For on call review [email or web address].

## 2022-01-07 DIAGNOSIS — A419 Sepsis, unspecified organism: Secondary | ICD-10-CM | POA: Diagnosis not present

## 2022-01-07 DIAGNOSIS — N049 Nephrotic syndrome with unspecified morphologic changes: Secondary | ICD-10-CM | POA: Diagnosis not present

## 2022-01-07 DIAGNOSIS — J189 Pneumonia, unspecified organism: Secondary | ICD-10-CM | POA: Diagnosis not present

## 2022-01-07 LAB — COMPREHENSIVE METABOLIC PANEL
ALT: 9 U/L (ref 0–44)
AST: 15 U/L (ref 15–41)
Albumin: <1.5 g/dL — ABNORMAL LOW (ref 3.5–5.0)
Alkaline Phosphatase: 50 U/L (ref 38–126)
Anion gap: 3 — ABNORMAL LOW (ref 5–15)
BUN: 32 mg/dL — ABNORMAL HIGH (ref 6–20)
CO2: 26 mmol/L (ref 22–32)
Calcium: 6.6 mg/dL — ABNORMAL LOW (ref 8.9–10.3)
Chloride: 113 mmol/L — ABNORMAL HIGH (ref 98–111)
Creatinine, Ser: 3.45 mg/dL — ABNORMAL HIGH (ref 0.61–1.24)
GFR, Estimated: 24 mL/min — ABNORMAL LOW (ref >60)
Glucose, Bld: 97 mg/dL (ref 70–99)
Potassium: 3.6 mmol/L (ref 3.5–5.1)
Sodium: 142 mmol/L (ref 135–145)
Total Bilirubin: 0.4 mg/dL (ref 0.3–1.2)
Total Protein: <3 g/dL — ABNORMAL LOW (ref 6.5–8.1)

## 2022-01-07 LAB — CBC
HCT: 24.7 % — ABNORMAL LOW (ref 39.0–52.0)
Hemoglobin: 8.3 g/dL — ABNORMAL LOW (ref 13.0–17.0)
MCH: 28.5 pg (ref 26.0–34.0)
MCHC: 33.6 g/dL (ref 30.0–36.0)
MCV: 84.9 fL (ref 80.0–100.0)
Platelets: 313 10*3/uL (ref 150–400)
RBC: 2.91 MIL/uL — ABNORMAL LOW (ref 4.22–5.81)
RDW: 13.5 % (ref 11.5–15.5)
WBC: 7.8 10*3/uL (ref 4.0–10.5)
nRBC: 0 % (ref 0.0–0.2)

## 2022-01-07 MED ORDER — DIPHENHYDRAMINE HCL 50 MG/ML IJ SOLN
25.0000 mg | Freq: Once | INTRAMUSCULAR | Status: AC
  Administered 2022-01-07: 25 mg via INTRAVENOUS
  Filled 2022-01-07: qty 1

## 2022-01-07 NOTE — Progress Notes (Signed)
  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 Pt also found to be septic with PNA  Assessment and Plan: * Sepsis due to pneumonia present on admit Patient presenting with evidence of tachycardia, fever and leukocytosis in the setting of what appears to be a multifocal left lower lobe pneumonia pneumonia on CT Treating patient with intravenous antibiotics with ceftriaxone and azithromycin Blood cultures neg thus far Procalcitonin elevated consistent with bacterial infection Leukocytosis resolved  Pneumonia of left lower lobe due to infectious organism Plan per above  Acute renal failure superimposed on stage 3a chronic kidney disease ([place]) In setting of FSGS Strict input and output monitoring Appreciate assistance by Nephrology. Given trial of IV lasix  Cr up to 3.45 with lasix now on hold given rising Cr Recheck bmet in AM  Anasarca associated with disorder of kidney Substantial anasarca noted on exam with pitting edema from the feet all the way through to the head. Patient does not appear to be compliant with his ARB in the outpatient setting Given trial of 100mg  TID lasix, however Cr is up to 3.45. Lasix presently on hold. Nephrology following Recheck bmet in AM  Mixed hyperlipidemia Resuming home regimen of atorvastatin.      Subjective: States swelling has improved  Physical Exam: Vitals:   01/06/22 1943 01/07/22 0352 01/07/22 0500 01/07/22 1424  BP: (!) 138/103 (!) 144/98  (!) 131/97  Pulse: 77 74  78  Resp: 20 16  18   Temp: 98.3 F (36.8  C) 98.1 F (36.7 C)  98.1 F (36.7 C)  TempSrc: Oral Oral  Oral  SpO2: 100% 100%  (!) 83%  Weight:   93 kg   Height:       General exam: Awake, laying in bed, in nad Respiratory system: Normal respiratory effort, no wheezing Cardiovascular system: regular rate, s1, s2 Gastrointestinal system: Soft, nondistended, positive BS Central nervous system: CN2-12 grossly intact, strength intact Extremities: Perfused, no clubbing Skin: Normal skin turgor, no notable skin lesions seen Psychiatry: Mood normal // no visual hallucinations   Data Reviewed:  Labs reviewed: Na 142, Cr 3.45   Family Communication: Pt in room, family not at bedside  Disposition: Status is: Inpatient Continue inpatient stay because: Severity of illness  Planned Discharge Destination: Home     Author: Marylu Lund, MD 01/07/2022 2:39 PM  For on call review [email or web address].

## 2022-01-07 NOTE — Progress Notes (Signed)
  Transition of Care [facility]) Screening Note   Patient Details  Name: Philip Trevino Date of Birth: May 11, 1996   Transition of Care [facility]) CM/SW Contact:    Dessa Phi, RN Phone Number: 01/07/2022, 3:44 PM    Transition of Care Department [facility]) has reviewed patient and no TOC needs have been identified at this time. We will continue to monitor patient advancement through interdisciplinary progression rounds. If new patient transition needs arise, please place a TOC consult.

## 2022-01-07 NOTE — Plan of Care (Signed)
  Problem: Education: Goal: Knowledge of General Education information will improve Description: Including pain rating scale, medication(s)/side effects and non-pharmacologic comfort measures Outcome: Progressing   Problem: Clinical Measurements: Goal: Respiratory complications will improve Outcome: Progressing   Problem: Activity: Goal: Risk for activity intolerance will decrease Outcome: Progressing   

## 2022-01-07 NOTE — Progress Notes (Signed)
[facility] Progress Note  Subjective:  He had 1.8 liters  UOP over 6/18.  Diuretics were held as increase in cr noted.  He thinks he drinks too much and asked about fluid restrictions.  He states that he follows with Dr. Hollie Salk at [facility] and that recently she had wanted to change him to torsemide from furosemide.  He states his pharmacy didn't have rx so he's continued on lasix 80 mg BID.  Not on metolazone at home.  He states that he is due for rituxan tomorrow and I let him know we would post-pone this for now  Review of systems: Denies n/v today - had some nausea earlier which may have been post-tussive Denies chest pain  Reports shortness of breath improved     Vitals:   01/06/22 1409 01/06/22 1943 01/07/22 0352 01/07/22 0500  BP: 133/87 (!) 138/103 (!) 144/98   Pulse: 73 77 74   Resp: _0 Temp: 98.7 F (37.1 C) 98.3 F (36.8 C) 98.1 F (36.7 C)   TempSrc: Oral Oral Oral   SpO2: 99% 100% 100%   Weight:    93 kg  Height:        Exam:  Gen alert, no distress HEENT NCAT Chest clear bilat to bases S1S2 no M Abd soft ntnd Ext - 1+ edema  Neuro is alert, Ox 3 , nf Psych normal mood and affect     CT abd / chest - LLL consolidation better than last imaging, +some ascites and anasarca.   CT abd - Adrenals/Urinary Tract: Normal adrenal glands. Symmetric, nonobstructed kidneys. No nephrolithiasis. Ureters appear nondilated. Unremarkable bladder.  CXr - no edema    Home meds include - aspirin, losartan 25 qd, atorvastatin, ezetimibe, gabapentin, torsemide 80 bid      Date                          Creat               eGFR    2019- 2020               0.9- 1.0    2021                         2.42 >> 0.95    AKI episode    March 2022              3.10 >> 1.88    Aki, ckd     07/30/21                     2.29                 40 ml/min    1/24- 08/25/21              6.41 >> 1.85    Aki/ ckd, 12- 52 ml/min    5/11- 12/05/21            2.23 >> 1.80     Aki/ ckd,  39- 53 ml/min    6/16                          2.47         6/17  2.52                 35 ml/min        Assessment/ Plan: AKI on CKD 3. FSG w/ severe neph syndrome. Creat here 2.5 on admission 6/16 in setting of PNA and sepsis. hypertensive on admit.  UA mild rbc/ wbcs and heavy proteinuria. CT shows normal kidneys w/o obstruction.  Creatinine has plateaued  Would pause losartan as you are Diuretics are on hold for increase in Cr. Last lasix and metolazone on 6/18 early am.  Plan for trial of transition to torsemide when diuretics are resumed He is due for rituxan but we are post-poning dose (due tomorrow) due to PNA.  He will need to follow-up with [facility] to reschedule this on hospital follow-up  Vol overload - May 2023 admit was diuresed from 212 lbs to 182 lbs. Diuretics were held after yesterday am's dose due to increase in Cr. Discussed fluid restrictions with pt.  PNA - w/ cough, fever and mild LLL consolidation by CT. Got a dose of IV vanc but is now just getting azithro/ rocephin IV. Abx per primary team  CKD stage 3 - baseline Cr 1.8 - 2.2 From Jan - May 2023  Disposition - continue inpatient monitoring       Recent Labs  Lab 01/06/22 0459 01/07/22 0353  HGB 8.0* 8.3*  ALBUMIN <1.5* <1.5*  CALCIUM 6.6* 6.6*  CREATININE 3.21* 3.45*  K 3.7 3.6   Inpatient medications:  atorvastatin  80 mg Oral Daily   azithromycin  500 mg Oral Daily   enoxaparin (LOVENOX) injection  40 mg Subcutaneous Q24H    cefTRIAXone (ROCEPHIN)  IV Stopped (01/07/22 1229)   acetaminophen **OR** acetaminophen, albuterol, ondansetron **OR** ondansetron (ZOFRAN) IV, oxyCODONE-acetaminophen **OR** oxyCODONE-acetaminophen, polyethylene glycol   Claudia Desanctis, MD 01/07/2022 2:52 PM

## 2022-01-08 ENCOUNTER — Inpatient Hospital Stay (HOSPITAL_COMMUNITY): Admission: RE | Admit: 2022-01-08 | Payer: Medicaid Other | Source: Ambulatory Visit

## 2022-01-08 DIAGNOSIS — R601 Generalized edema: Secondary | ICD-10-CM

## 2022-01-08 DIAGNOSIS — A419 Sepsis, unspecified organism: Secondary | ICD-10-CM | POA: Diagnosis not present

## 2022-01-08 DIAGNOSIS — J189 Pneumonia, unspecified organism: Secondary | ICD-10-CM | POA: Diagnosis not present

## 2022-01-08 DIAGNOSIS — N049 Nephrotic syndrome with unspecified morphologic changes: Secondary | ICD-10-CM | POA: Diagnosis not present

## 2022-01-08 LAB — COMPREHENSIVE METABOLIC PANEL
ALT: 7 U/L (ref 0–44)
AST: 12 U/L — ABNORMAL LOW (ref 15–41)
Albumin: <1.5 g/dL — ABNORMAL LOW (ref 3.5–5.0)
Alkaline Phosphatase: 48 U/L (ref 38–126)
Anion gap: 3 — ABNORMAL LOW (ref 5–15)
BUN: 32 mg/dL — ABNORMAL HIGH (ref 6–20)
CO2: 25 mmol/L (ref 22–32)
Calcium: 6.5 mg/dL — ABNORMAL LOW (ref 8.9–10.3)
Chloride: 113 mmol/L — ABNORMAL HIGH (ref 98–111)
Creatinine, Ser: 3.41 mg/dL — ABNORMAL HIGH (ref 0.61–1.24)
GFR, Estimated: 25 mL/min — ABNORMAL LOW (ref >60)
Glucose, Bld: 108 mg/dL — ABNORMAL HIGH (ref 70–99)
Potassium: 3.5 mmol/L (ref 3.5–5.1)
Sodium: 141 mmol/L (ref 135–145)
Total Bilirubin: 0.3 mg/dL (ref 0.3–1.2)
Total Protein: 3.1 g/dL — ABNORMAL LOW (ref 6.5–8.1)

## 2022-01-08 LAB — CBC
HCT: 24.4 % — ABNORMAL LOW (ref 39.0–52.0)
Hemoglobin: 8.2 g/dL — ABNORMAL LOW (ref 13.0–17.0)
MCH: 28.2 pg (ref 26.0–34.0)
MCHC: 33.6 g/dL (ref 30.0–36.0)
MCV: 83.8 fL (ref 80.0–100.0)
Platelets: 333 10*3/uL (ref 150–400)
RBC: 2.91 MIL/uL — ABNORMAL LOW (ref 4.22–5.81)
RDW: 13.2 % (ref 11.5–15.5)
WBC: 7.1 10*3/uL (ref 4.0–10.5)
nRBC: 0 % (ref 0.0–0.2)

## 2022-01-08 LAB — IRON AND TIBC: Iron: 22 ug/dL — ABNORMAL LOW (ref 45–182)

## 2022-01-08 LAB — FERRITIN: Ferritin: 611 ng/mL — ABNORMAL HIGH (ref 24–336)

## 2022-01-08 MED ORDER — ALBUMIN HUMAN 25 % IV SOLN
25.0000 g | Freq: Once | INTRAVENOUS | Status: AC
  Administered 2022-01-08: 25 g via INTRAVENOUS
  Filled 2022-01-08: qty 100

## 2022-01-08 NOTE — Progress Notes (Signed)
[facility] Progress Note  Subjective:  He had 1.3 liters  UOP over 6/19.  Diuretics were held as increase in cr noted after lasix/metolazone. He last saw Dr. Hollie Salk on 5/5 and had been ordered but hadn't received the rituxan at that time.  Note indicates that they were unclear on his diuretic dose due to limitations from patient self-reporting.  Cr on 11/23/21 was 2.37 and EGFR 38.  No weight today    Review of systems: Denies n/v today Denies chest pain  Reports shortness of breath improved since admission    Vitals:   01/07/22 2111 01/08/22 0559 01/08/22 0600 01/08/22 1246  BP: (!) 153/111 (!) 150/110 (!) 152/107 (!) 155/104  Pulse: 72 78 76 86  Resp:  18  18  Temp:  98 F (36.7 C)  98.3 F (36.8 C)  TempSrc:      SpO2: 100% 100%  99%  Weight:      Height:        Exam:    Gen alert, no distress HEENT NCAT Chest clear bilat to bases S1S2 no M Abd soft ntnd Ext - 1+ edema  Neuro is alert, Ox 3 , follows commands and provides hx Psych normal mood and affect     CT abd / chest - LLL consolidation better than last imaging, +some ascites and anasarca.   CT abd - Adrenals/Urinary Tract: Normal adrenal glands. Symmetric, nonobstructed kidneys. No nephrolithiasis. Ureters appear nondilated. Unremarkable bladder.  CXr - no edema    Home meds include - aspirin, losartan 25 qd, atorvastatin, ezetimibe, gabapentin, torsemide 80 bid      Date                          Creat               eGFR    2019- 2020               0.9- 1.0    2021                         2.42 >> 0.95    AKI episode    March 2022              3.10 >> 1.88    Aki, ckd     07/30/21                     2.29                 40 ml/min    1/24- 08/25/21              6.41 >> 1.85    Aki/ ckd, 12- 52 ml/min    5/11- 12/05/21            2.23 >> 1.80    Aki/ ckd,  39- 53 ml/min    6/16                          2.47         6/17                          2.52                 35 ml/min        Assessment/  Plan: AKI on  CKD 3. FSG w/ severe neph syndrome. Creat here 2.5 on admission 6/16 in setting of PNA and sepsis. hypertensive on admit.  UA mild rbc/ wbcs and heavy proteinuria. CT shows normal kidneys w/o obstruction.  Creatinine has plateaued  Would pause losartan as you are Diuretics are on hold for increase in Cr. Last lasix and metolazone on 6/18 early am.  Plan for trial of transition to torsemide when diuretics resumed (anticipate this will be tomorrow hopefully ) Albumin once as other options today are limited He is due for rituxan but we are post-poning dose (due tomorrow) due to PNA.  He will need to follow-up with [facility] to reschedule this on hospital follow-up  Vol overload - May 2023 admit was diuresed from 212 lbs to 182 lbs. Diuretics were held due to increase in Cr. Discussed fluid restrictions with pt. As above planning for transition to torsemide   PNA - w/ cough, fever and mild LLL consolidation by CT. Got a dose of IV vanc but is now just getting azithro/ rocephin IV. Abx per primary team  CKD stage 3 - baseline Cr 1.8 - 2.3 From Jan - May 2023 Anemia CKD - update iron panel.  Anticipate need for ESA to improve diuresis   Disposition - continue inpatient monitoring please     Recent Labs  Lab 01/07/22 0353 01/08/22 0453  HGB 8.3* 8.2*  ALBUMIN <1.5* <1.5*  CALCIUM 6.6* 6.5*  CREATININE 3.45* 3.41*  K 3.6 3.5   Inpatient medications:  atorvastatin  80 mg Oral Daily   azithromycin  500 mg Oral Daily   enoxaparin (LOVENOX) injection  40 mg Subcutaneous Q24H    cefTRIAXone (ROCEPHIN)  IV 2 g (01/08/22 1203)   acetaminophen **OR** acetaminophen, albuterol, ondansetron **OR** ondansetron (ZOFRAN) IV, oxyCODONE-acetaminophen **OR** oxyCODONE-acetaminophen, polyethylene glycol   Claudia Desanctis, MD 01/08/2022 3:37 PM

## 2022-01-08 NOTE — Progress Notes (Signed)
  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 Pt also found to be septic with PNA  Assessment and Plan: * Sepsis due to pneumonia present on admit Patient presenting with evidence of tachycardia, fever and leukocytosis in the setting of \ multifocal left lower lobe pneumonia pneumonia on CT Pt to complete ceftriaxone and azithromycin on 6/21 to complete 5 days of tx Blood cultures neg thus far Procalcitonin elevated consistent with bacterial infection Leukocytosis resolved  Pneumonia of left lower lobe due to infectious organism Plan per above  Acute renal failure superimposed on stage 3a chronic kidney disease ([place]) In setting of FSGS Strict input and output monitoring Appreciate assistance by Nephrology. Recently given trial of IV lasix  Cr peaked to 3.45 with lasix now on hold. Cr down to 3.41 Per Nephrology, cont to monitor off diuretic today, consider restarting torsemide 6/21 Recheck bmet in AM  Anasarca associated with disorder of kidney Substantial anasarca noted on exam with pitting edema from the feet all the way through to the head. Patient does not appear to be compliant with his ARB in the outpatient setting Recently given IV lasix. Diuretics now on hold per Nephrology Consideration for resuming torsemide 6/21 per Nephrology Recheck bmet in AM  Mixed hyperlipidemia Resuming home regimen of atorvastatin.      Subjective: Without complaints this AM. Still swollen. Denies sob or chest pains  Physical Exam: Vitals:   01/07/22 2111 01/08/22  0559 01/08/22 0600 01/08/22 1246  BP: (!) 153/111 (!) 150/110 (!) 152/107 (!) 155/104  Pulse: 72 78 76 86  Resp:  18  18  Temp:  98 F (36.7 C)  98.3 F (36.8 C)  TempSrc:      SpO2: 100% 100%  99%  Weight:      Height:       General exam: Conversant, in no acute distress Respiratory system: normal chest rise, clear, no audible wheezing Cardiovascular system: regular rhythm, s1-s2 Gastrointestinal system: Nondistended, nontender, pos BS Central nervous system: No seizures, no tremors Extremities: No cyanosis, no joint deformities Skin: No rashes, no pallor Psychiatry: Affect normal // no auditory hallucinations   Data Reviewed:  Labs reviewed: Na 141, Cr 3.41   Family Communication: Pt in room, family not at bedside  Disposition: Status is: Inpatient Continue inpatient stay because: Severity of illness  Planned Discharge Destination: Home     Author: Marylu Lund, MD 01/08/2022 3:57 PM  For on call review [email or web address].

## 2022-01-09 DIAGNOSIS — A419 Sepsis, unspecified organism: Secondary | ICD-10-CM | POA: Diagnosis not present

## 2022-01-09 DIAGNOSIS — J189 Pneumonia, unspecified organism: Secondary | ICD-10-CM | POA: Diagnosis not present

## 2022-01-09 LAB — CBC
HCT: 24.5 % — ABNORMAL LOW (ref 39.0–52.0)
Hemoglobin: 8.3 g/dL — ABNORMAL LOW (ref 13.0–17.0)
MCH: 27.9 pg (ref 26.0–34.0)
MCHC: 33.9 g/dL (ref 30.0–36.0)
MCV: 82.2 fL (ref 80.0–100.0)
Platelets: 338 10*3/uL (ref 150–400)
RBC: 2.98 MIL/uL — ABNORMAL LOW (ref 4.22–5.81)
RDW: 12.8 % (ref 11.5–15.5)
WBC: 7.4 10*3/uL (ref 4.0–10.5)
nRBC: 0 % (ref 0.0–0.2)

## 2022-01-09 LAB — COMPREHENSIVE METABOLIC PANEL
ALT: 7 U/L (ref 0–44)
AST: 13 U/L — ABNORMAL LOW (ref 15–41)
Albumin: <1.5 g/dL — ABNORMAL LOW (ref 3.5–5.0)
Alkaline Phosphatase: 49 U/L (ref 38–126)
Anion gap: 4 — ABNORMAL LOW (ref 5–15)
BUN: 33 mg/dL — ABNORMAL HIGH (ref 6–20)
CO2: 25 mmol/L (ref 22–32)
Calcium: 6.7 mg/dL — ABNORMAL LOW (ref 8.9–10.3)
Chloride: 114 mmol/L — ABNORMAL HIGH (ref 98–111)
Creatinine, Ser: 3.54 mg/dL — ABNORMAL HIGH (ref 0.61–1.24)
GFR, Estimated: 23 mL/min — ABNORMAL LOW (ref >60)
Glucose, Bld: 92 mg/dL (ref 70–99)
Potassium: 3.4 mmol/L — ABNORMAL LOW (ref 3.5–5.1)
Sodium: 143 mmol/L (ref 135–145)
Total Bilirubin: 0.4 mg/dL (ref 0.3–1.2)
Total Protein: 3.4 g/dL — ABNORMAL LOW (ref 6.5–8.1)

## 2022-01-09 LAB — CULTURE, BLOOD (SINGLE)
Culture: NO GROWTH
Special Requests: ADEQUATE

## 2022-01-09 MED ORDER — TORSEMIDE 20 MG PO TABS
40.0000 mg | ORAL_TABLET | Freq: Once | ORAL | Status: AC
  Administered 2022-01-09: 40 mg via ORAL
  Filled 2022-01-09: qty 2

## 2022-01-09 MED ORDER — POTASSIUM CHLORIDE CRYS ER 20 MEQ PO TBCR
20.0000 meq | EXTENDED_RELEASE_TABLET | Freq: Once | ORAL | Status: DC
Start: 2022-01-09 — End: 2022-01-09
  Filled 2022-01-09: qty 1

## 2022-01-09 MED ORDER — AMLODIPINE BESYLATE 5 MG PO TABS
5.0000 mg | ORAL_TABLET | Freq: Every day | ORAL | Status: DC
  Administered 2022-01-09 – 2022-01-10 (×2): 5 mg via ORAL
  Filled 2022-01-09 (×2): qty 1

## 2022-01-09 MED ORDER — HYDRALAZINE HCL 20 MG/ML IJ SOLN
10.0000 mg | Freq: Four times a day (QID) | INTRAMUSCULAR | Status: DC | PRN
  Administered 2022-01-09: 10 mg via INTRAVENOUS
  Filled 2022-01-09: qty 1

## 2022-01-09 MED ORDER — SODIUM CHLORIDE 0.9 % IV SOLN
2.0000 g | Freq: Once | INTRAVENOUS | Status: AC
  Administered 2022-01-09: 2 g via INTRAVENOUS
  Filled 2022-01-09: qty 20

## 2022-01-09 MED ORDER — ALBUMIN HUMAN 25 % IV SOLN
25.0000 g | Freq: Once | INTRAVENOUS | Status: AC
  Administered 2022-01-09: 25 g via INTRAVENOUS
  Filled 2022-01-09: qty 100

## 2022-01-09 MED ORDER — TORSEMIDE 20 MG PO TABS
40.0000 mg | ORAL_TABLET | Freq: Two times a day (BID) | ORAL | Status: DC
  Administered 2022-01-10 – 2022-01-11 (×3): 40 mg via ORAL
  Filled 2022-01-09 (×4): qty 2

## 2022-01-09 MED ORDER — DARBEPOETIN ALFA 40 MCG/0.4ML IJ SOSY
40.0000 ug | PREFILLED_SYRINGE | Freq: Once | INTRAMUSCULAR | Status: AC
  Administered 2022-01-09: 40 ug via SUBCUTANEOUS
  Filled 2022-01-09: qty 0.4

## 2022-01-09 MED ORDER — TORSEMIDE 20 MG PO TABS
40.0000 mg | ORAL_TABLET | Freq: Every day | ORAL | Status: DC
  Filled 2022-01-09: qty 2

## 2022-01-09 MED ORDER — POTASSIUM CHLORIDE CRYS ER 20 MEQ PO TBCR
40.0000 meq | EXTENDED_RELEASE_TABLET | Freq: Once | ORAL | Status: AC
Start: 2022-01-09 — End: 2022-01-09
  Administered 2022-01-09: 40 meq via ORAL

## 2022-01-09 MED ORDER — SODIUM CHLORIDE 0.9 % IV SOLN
250.0000 mg | Freq: Every day | INTRAVENOUS | Status: AC
  Administered 2022-01-09 – 2022-01-10 (×2): 250 mg via INTRAVENOUS
  Filled 2022-01-09 (×2): qty 20

## 2022-01-09 NOTE — Progress Notes (Signed)
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 Pt also found to be septic with PNA  Assessment & Plan:   Principal Problem:   Sepsis due to pneumonia [facility]) Active Problems:   Acute renal failure superimposed on stage 3a chronic kidney disease (HCC)   Pneumonia of left lower lobe due to infectious organism   Anasarca associated with disorder of kidney   Mixed hyperlipidemia   Anasarca  Sepsis due to pneumonia POA: Patient presenting with evidence of tachycardia, fever and leukocytosis in the setting of \ multifocal left lower lobe pneumonia pneumonia on CT. completed 5 days of IV Rocephin and Zithromax on 01/09/2022.  Blood cultures negative.  Acute renal failure superimposed on stage 3a ckd in the setting of FSG with severe nephrotic syndrome: Baseline creatinine around 1.8.  Presented with 2.5 which rose further at 3.5 and now has plateaued.  Nephrology managing and patient is being monitored off of diuretics.  Plan to reinitiate torsemide at some point in time.  Hypertensive urgency: Patient's diastolic blood pressure has remained very high and peaked at 120 at some point in time.  Blood pressure has improved but still elevated.  Due to rising creatinine, losartan on hold.  Will start him on scheduled amlodipine 5 mg p.o. daily and as needed hydralazine.  Hypokalemia: 3.4.  Will replace.   Anasarca associated with disorder of kidney: Substantial anasarca noted on exam with pitting edema from the feet all the way through to the head.  Management as mentioned above.   Mixed hyperlipidemia: Continue atorvastatin.  DVT  prophylaxis: enoxaparin (LOVENOX) injection 40 mg Start: 01/05/22 1000   Code Status: Full Code  Family Communication:  None present at bedside.  Plan of care discussed with patient in length and he/she verbalized understanding and agreed with it.  Status is: Inpatient Remains inpatient appropriate because: Creatinine is still elevated, need to start on diuretics once creatinine improves.  Also blood pressure significantly elevated.   Estimated body mass index is 29.75 kg/m as calculated from the following:   Height as of this encounter: 5' 9.5" (1.765 m).   Weight as of this encounter: 92.7 kg.    Nutritional Assessment: Body mass index is 29.75 kg/m.Marland Kitchen Seen by dietician.  I agree with the assessment and plan as outlined below: Nutrition Status:        . Skin Assessment: I have examined the patient's skin and I agree with the wound assessment as performed by the wound care RN as outlined below:    Consultants:  Nephrology  Procedures:  None  Antimicrobials:  Anti-infectives (From admission, onward)    Start     Dose/Rate Route Frequency Ordered Stop   01/09/22 1230  cefTRIAXone (ROCEPHIN) 2 g in sodium chloride 0.9 % 100 mL IVPB        2 g 200 mL/hr over 30 Minutes Intravenous  Once 01/09/22 1218     01/07/22 1000  azithromycin (ZITHROMAX) tablet 500 mg        500 mg Oral Daily 01/06/22 1120 01/09/22 0935   01/05/22 2200  vancomycin (VANCOREADY) IVPB 1250 mg/250 mL  Status:  Discontinued        1,250 mg 166.7  mL/hr over 90 Minutes Intravenous Every 24 hours 01/05/22 0125 01/05/22 0941   01/05/22 1200  ceFEPIme (MAXIPIME) 2 g in sodium chloride 0.9 % 100 mL IVPB  Status:  Discontinued        2 g 200 mL/hr over 30 Minutes Intravenous Every 12 hours 01/05/22 0035 01/05/22 0941   01/05/22 1200  cefTRIAXone (ROCEPHIN) 2 g in sodium chloride 0.9 % 100 mL IVPB        2 g 200 mL/hr over 30 Minutes Intravenous Every 24 hours 01/05/22 0941 01/09/22 2359   01/05/22 1030   azithromycin (ZITHROMAX) 500 mg in sodium chloride 0.9 % 250 mL IVPB  Status:  Discontinued        500 mg 250 mL/hr over 60 Minutes Intravenous Every 24 hours 01/05/22 0941 01/06/22 1120   01/05/22 0035  vancomycin variable dose per unstable renal function (pharmacist dosing)  Status:  Discontinued         Does not apply See admin instructions 01/05/22 0035 01/05/22 0125   01/05/22 0015  ceFEPIme (MAXIPIME) 2 g in sodium chloride 0.9 % 100 mL IVPB        2 g 200 mL/hr over 30 Minutes Intravenous  Once 01/05/22 0003 01/05/22 0053   01/05/22 0015  metroNIDAZOLE (FLAGYL) IVPB 500 mg        500 mg 100 mL/hr over 60 Minutes Intravenous  Once 01/05/22 0003 01/05/22 0700   01/05/22 0015  vancomycin (VANCOCIN) IVPB 1000 mg/200 mL premix        1,000 mg 200 mL/hr over 60 Minutes Intravenous  Once 01/05/22 0003 01/05/22 0116         Subjective: Patient seen and examined.  He has no complaints.  Objective: Vitals:   01/08/22 2249 01/09/22 0605 01/09/22 0935 01/09/22 1224  BP: (!) 149/100 (!) 153/105 (!) 157/110 (!) 151/87  Pulse: 70 70  77  Resp:  19    Temp:  98.5 F (36.9 C)  98.1 F (36.7 C)  TempSrc:  Oral  Oral  SpO2:  99%  100%  Weight:  92.7 kg    Height:        Intake/Output Summary (Last 24 hours) at 01/09/2022 1250 Last data filed at 01/09/2022 0600 Gross per 24 hour  Intake 240 ml  Output 575 ml  Net -335 ml   Filed Weights   01/06/22 0658 01/07/22 0500 01/09/22 0605  Weight: 93.4 kg 93 kg 92.7 kg    Examination:  General exam: Appears calm and comfortable  Respiratory system: Clear to auscultation. Respiratory effort normal. Cardiovascular system: S1 & S2 heard, RRR. No JVD, murmurs, rubs, gallops or clicks. No pedal edema. Gastrointestinal system: Abdomen is nondistended, soft and nontender. No organomegaly or masses felt. Normal bowel sounds heard. Central nervous system: Alert and oriented. No focal neurological deficits. Extremities: Symmetric 5 x 5  power. Skin: No rashes, lesions or ulcers Psychiatry: Judgement and insight appear normal. Mood & affect appropriate.    Data Reviewed: I have personally reviewed following labs and imaging studies  CBC: Recent Labs  Lab 01/04/22 2128 01/05/22 0526 01/06/22 0459 01/07/22 0353 01/08/22 0453 01/09/22 0553  WBC 15.2* 12.2* 7.7 7.8 7.1 7.4  NEUTROABS 13.2* 10.1*  --   --   --   --   HGB 8.9* 8.6* 8.0* 8.3* 8.2* 8.3*  HCT 26.4* 25.3* 23.8* 24.7* 24.4* 24.5*  MCV 83.8 84.1 84.7 84.9 83.8 82.2  PLT 379 295 294 313 333 063   Basic Metabolic Panel: Recent  Labs  Lab 01/05/22 0526 01/06/22 0459 01/07/22 0353 01/08/22 0453 01/09/22 0553  NA 139 138 142 141 143  K 3.5 3.7 3.6 3.5 3.4*  CL 114* 111 113* 113* 114*  CO2 22 23 26 25 25   GLUCOSE 103* 89 97 108* 92  BUN 29* 30* 32* 32* 33*  CREATININE 2.52* 3.21* 3.45* 3.41* 3.54*  CALCIUM 6.6* 6.6* 6.6* 6.5* 6.7*  MG 1.8  --   --   --   --    GFR: Estimated Creatinine Clearance: 36.2 mL/min (A) (by C-G formula based on SCr of 3.54 mg/dL (H)). Liver Function Tests: Recent Labs  Lab 01/05/22 0526 01/06/22 0459 01/07/22 0353 01/08/22 0453 01/09/22 0553  AST 17 15 15  12* 13*  ALT 10 8 9 7 7   ALKPHOS 59 51 50 48 49  BILITOT 0.7 0.3 0.4 0.3 0.4  PROT 3.4* 3.1* <3.0* 3.1* 3.4*  ALBUMIN <1.5* <1.5* <1.5* <1.5* <1.5*   No results for input(s): "LIPASE", "AMYLASE" in the last 168 hours. No results for input(s): "AMMONIA" in the last 168 hours. Coagulation Profile: Recent Labs  Lab 01/05/22 0526  INR 1.2   Cardiac Enzymes: No results for input(s): "CKTOTAL", "CKMB", "CKMBINDEX", "TROPONINI" in the last 168 hours. BNP (last 3 results) No results for input(s): "PROBNP" in the last 8760 hours. HbA1C: No results for input(s): "HGBA1C" in the last 72 hours. CBG: No results for input(s): "GLUCAP" in the last 168 hours. Lipid Profile: No results for input(s): "CHOL", "HDL", "LDLCALC", "TRIG", "CHOLHDL", "LDLDIRECT" in the last  72 hours. Thyroid Function Tests: No results for input(s): "TSH", "T4TOTAL", "FREET4", "T3FREE", "THYROIDAB" in the last 72 hours. Anemia Panel: Recent Labs    01/08/22 0453  FERRITIN 611*  TIBC NOT CALCULATED  IRON 22*   Sepsis Labs: Recent Labs  Lab 01/04/22 2226 01/05/22 0526 01/05/22 0711  PROCALCITON  --  0.66  --   LATICACIDVEN 1.2 0.6 0.5    Recent Results (from the past 240 hour(s))  Culture, blood (single)     Status: None   Collection Time: 01/04/22 10:30 PM   Specimen: BLOOD  Result Value Ref Range Status   Specimen Description   Final    BLOOD BLOOD LEFT HAND Performed at [facility], [place] [address], [place], [place] [address]    Special Requests   Final    BOTTLES DRAWN AEROBIC AND ANAEROBIC Blood Culture adequate volume Performed at [place] [address] [place], [place] [address]    Culture   Final    NO GROWTH 5 DAYS Performed at [facility], [place] [address] [place],  [address]    Report Status 01/09/2022 FINAL  Final  Resp Panel by RT-PCR (Flu A&B, Covid) Anterior Nasal Swab     Status: None   Collection Time: 01/04/22 11:35 PM   Specimen: Anterior Nasal Swab  Result Value Ref Range Status   SARS Coronavirus 2 by RT PCR NEGATIVE NEGATIVE Final    Comment: (NOTE) SARS-CoV-2 target nucleic acids are NOT DETECTED.  The SARS-CoV-2 RNA is generally detectable in upper respiratory specimens during the acute phase of infection. The lowest concentration of SARS-CoV-2 viral copies this assay can detect is 138 copies/mL. A negative result does not preclude SARS-Cov-2 infection and should not be used as the sole basis for treatment or other patient management decisions. A negative result may occur with  improper specimen collection/handling, submission of specimen other than nasopharyngeal swab, presence of viral mutation(s) within  the areas targeted by this assay, and inadequate number of  viral copies(<138 copies/mL). A negative result must be combined with clinical observations, patient history, and epidemiological information. The expected result is Negative.  Fact Sheet for Patients:  EntrepreneurPulse.com.au  Fact Sheet for Healthcare Providers:  IncredibleEmployment.be  This test is no t yet approved or cleared by the [place] FDA and  has been authorized for detection and/or diagnosis of SARS-CoV-2 by FDA under an Emergency Use Authorization (EUA). This EUA will remain  in effect (meaning this test can be used) for the duration of the COVID-19 declaration under Section 564(b)(1) of the Act, 21 U.S.C.section 360bbb-3(b)(1), unless the authorization is terminated  or revoked sooner.       Influenza A by PCR NEGATIVE NEGATIVE Final   Influenza B by PCR NEGATIVE NEGATIVE Final    Comment: (NOTE) The Xpert Xpress SARS-CoV-2/FLU/RSV plus assay is intended as an aid in the diagnosis of influenza from Nasopharyngeal swab specimens and should not be used as a sole basis for treatment. Nasal washings and aspirates are unacceptable for Xpert Xpress SARS-CoV-2/FLU/RSV testing.  Fact Sheet for Patients: EntrepreneurPulse.com.au  Fact Sheet for Healthcare Providers: IncredibleEmployment.be  This test is not yet approved or cleared by the [place] FDA and has been authorized for detection and/or diagnosis of SARS-CoV-2 by FDA under an Emergency Use Authorization (EUA). This EUA will remain in effect (meaning this test can be used) for the duration of the COVID-19 declaration under Section 564(b)(1) of the Act, 21 U.S.C. section 360bbb-3(b)(1), unless the authorization is terminated or revoked.  Performed at [facility], [place] [address], [place], [place] [address]   Culture, blood (x 2)     Status: None (Preliminary result)   Collection Time: 01/05/22  5:26 AM    Specimen: BLOOD  Result Value Ref Range Status   Specimen Description   Final    BLOOD BLOOD RIGHT HAND Performed at [place] [address] [place], [place] [address]    Special Requests   Final    BOTTLES DRAWN AEROBIC AND ANAEROBIC Blood Culture adequate volume Performed at [place] [address] [place], [place] [address]    Culture   Final    NO GROWTH 4 DAYS Performed at [facility], [place] [address], [place], [place] [address]    Report Status PENDING  Incomplete  Culture, blood (x 2)     Status: None (Preliminary result)   Collection Time: 01/05/22  5:27 AM   Specimen: BLOOD  Result Value Ref Range Status   Specimen Description   Final    BLOOD LEFT ANTECUBITAL Performed at [place] [address], [place], [place] [address]    Special Requests   Final    BOTTLES DRAWN AEROBIC AND ANAEROBIC Blood Culture adequate volume Performed at [place] [address], [place], [place] [address]    Culture   Final    NO GROWTH 4 DAYS Performed at [facility], [place] [address], [place],  [address]    Report Status PENDING  Incomplete     Radiology Studies: No results found.  Scheduled Meds:  amLODipine  5 mg Oral Daily   atorvastatin  80 mg Oral Daily   enoxaparin (LOVENOX) injection  40 mg Subcutaneous Q24H   Continuous Infusions:  cefTRIAXone (ROCEPHIN)  IV 2 g (01/08/22 1203)   cefTRIAXone (ROCEPHIN)  IV 2 g (01/09/22 1236)  LOS: 4 days   Darliss Cheney, MD [facility]  01/09/2022, 12:50 PM   *Please note that this is a verbal dictation therefore any spelling or grammatical errors are due to the "[place] [employer]" system interpretation.  Please page via [place] and do not message via secure chat for urgent patient care matters. Secure chat can be used for non urgent patient care matters.  How to contact the [facility] Attending or Consulting  provider [facility] or covering provider during after hours [place], for this patient?  Check the care team in [facility] and look for a) attending/consulting [facility] provider listed and b) the [facility] team listed. Page or secure chat [phone]. Log into [email or web address] and use [place]'s universal password to access. If you do not have the password, please contact the hospital operator. Locate the [facility] provider you are looking for under [facility] and page to a number that you can be directly reached. If you still have difficulty reaching the provider, please page the [facility] (Director on Call) for the Hospitalists listed on amion for assistance.

## 2022-01-09 NOTE — Progress Notes (Signed)
[facility] Progress Note  Subjective:  He had 600 mL UOP over 6/20.  Previously on lasix 80 mg PO BID outpatient.  We discussed risks/benefits/indications for ESA and he did consent to ESA.   Review of systems:   Denies n/v today Denies chest pain  Reports shortness of breath improved since admission  Supplemental hx:  He last saw Dr. Hollie Salk on 5/5.  There was a delay initially in getting rituxan outpatient as he hadn't signed the paperwork but he did get one dose.  Cr on 11/23/21 was 2.37 and EGFR 38.   Vitals:   01/08/22 2249 01/09/22 0605 01/09/22 0935 01/09/22 1224  BP: (!) 149/100 (!) 153/105 (!) 157/110 (!) 151/87  Pulse: 70 70  77  Resp:  19    Temp:  98.5 F (36.9 C)  98.1 F (36.7 C)  TempSrc:  Oral  Oral  SpO2:  99%  100%  Weight:  92.7 kg    Height:        Physical Exam:     Gen adult male in no distress HEENT NCAT Neck supple trachea midline Chest clear bilat to bases S1S2 no M Abd soft ntnd Ext - 1-2+ edema  Neuro is alert, Ox 3 , follows commands and provides hx Psych normal mood and affect     CT abd / chest - LLL consolidation better than last imaging, +some ascites and anasarca.   CT abd - Adrenals/Urinary Tract: Normal adrenal glands. Symmetric, nonobstructed kidneys. No nephrolithiasis. Ureters appear nondilated. Unremarkable bladder.  CXr - no edema    Home meds include - aspirin, losartan 25 qd, atorvastatin, ezetimibe, gabapentin, torsemide 80 bid      Date                          Creat               eGFR    2019- 2020               0.9- 1.0    2021                         2.42 >> 0.95    AKI episode    March 2022              3.10 >> 1.88    Aki, ckd     07/30/21                     2.29                 40 ml/min    1/24- 08/25/21              6.41 >> 1.85    Aki/ ckd, 12- 52 ml/min    5/11- 12/05/21            2.23 >> 1.80    Aki/ ckd,  39- 53 ml/min    6/16                          2.47         6/17                          2.52                  35 ml/min  Assessment/ Plan: AKI on CKD 3. FSG w/ severe neph syndrome. Creat here 2.5 on admission 6/16 in setting of PNA and sepsis. hypertensive on admit.  UA mild rbc/ wbcs and heavy proteinuria. CT shows normal kidneys w/o obstruction.  Creatinine has plateaued  hold losartan as you are Transition to torsemide 40 mg PO BID  Albumin once as other options today are limited He is due for rituxan but we are post-poning dose due to PNA.  He will need to follow-up with [facility] to reschedule this on hospital follow-up  Vol overload - May 2023 admit was diuresed from 212 lbs to 182 lbs. Diuretics as above.  Discussed fluid restrictions with pt. As above planning for transition to torsemide   PNA -CAP - w/ cough, fever and mild LLL consolidation by CT. Got a dose of IV vanc but is now just getting azithro/ rocephin IV. Abx per primary team  CKD stage 3 - baseline Cr 1.8 - 2.3 From Jan - May 2023 Anemia CKD - iron deficiency - iron sat was not calculated.  Will proceed with IV iron x 2.  Aranesp 40 mcg once today   Hypokalemia - increase K repletion to 40 meq once as we're giving torsemide  Disposition - continue inpatient monitoring please.  Hopeful for discharge in 1-2 days with close outpatient follow-up    Recent Labs  Lab 01/08/22 0453 01/09/22 0553  HGB 8.2* 8.3*  ALBUMIN <1.5* <1.5*  CALCIUM 6.5* 6.7*  CREATININE 3.41* 3.54*  K 3.5 3.4*   Inpatient medications:  amLODipine  5 mg Oral Daily   atorvastatin  80 mg Oral Daily   enoxaparin (LOVENOX) injection  40 mg Subcutaneous Q24H   potassium chloride  20 mEq Oral Once     acetaminophen **OR** acetaminophen, albuterol, hydrALAZINE, ondansetron **OR** ondansetron (ZOFRAN) IV, oxyCODONE-acetaminophen **OR** oxyCODONE-acetaminophen, polyethylene glycol   Claudia Desanctis, MD 01/09/2022 2:02 PM

## 2022-01-09 NOTE — Plan of Care (Signed)
  Problem: Activity: Goal: Risk for activity intolerance will decrease Outcome: Progressing   Problem: Coping: Goal: Level of anxiety will decrease Outcome: Progressing   Problem: Elimination: Goal: Will not experience complications related to urinary retention Outcome: Progressing   Problem: Pain Managment: Goal: General experience of comfort will improve Outcome: Progressing   

## 2022-01-10 DIAGNOSIS — A419 Sepsis, unspecified organism: Secondary | ICD-10-CM | POA: Diagnosis not present

## 2022-01-10 DIAGNOSIS — J189 Pneumonia, unspecified organism: Secondary | ICD-10-CM | POA: Diagnosis not present

## 2022-01-10 LAB — BASIC METABOLIC PANEL
Anion gap: 3 — ABNORMAL LOW (ref 5–15)
BUN: 30 mg/dL — ABNORMAL HIGH (ref 6–20)
CO2: 25 mmol/L (ref 22–32)
Calcium: 6.7 mg/dL — ABNORMAL LOW (ref 8.9–10.3)
Chloride: 112 mmol/L — ABNORMAL HIGH (ref 98–111)
Creatinine, Ser: 3.45 mg/dL — ABNORMAL HIGH (ref 0.61–1.24)
GFR, Estimated: 24 mL/min — ABNORMAL LOW (ref >60)
Glucose, Bld: 98 mg/dL (ref 70–99)
Potassium: 3.4 mmol/L — ABNORMAL LOW (ref 3.5–5.1)
Sodium: 140 mmol/L (ref 135–145)

## 2022-01-10 LAB — CULTURE, BLOOD (ROUTINE X 2)
Culture: NO GROWTH
Culture: NO GROWTH
Special Requests: ADEQUATE
Special Requests: ADEQUATE

## 2022-01-10 MED ORDER — AMLODIPINE BESYLATE 5 MG PO TABS
5.0000 mg | ORAL_TABLET | Freq: Once | ORAL | Status: AC
Start: 2022-01-10 — End: 2022-01-10
  Administered 2022-01-10: 5 mg via ORAL
  Filled 2022-01-10: qty 1

## 2022-01-10 MED ORDER — CALCIUM GLUCONATE-NACL 1-0.675 GM/50ML-% IV SOLN
1.0000 g | Freq: Once | INTRAVENOUS | Status: AC
  Administered 2022-01-10: 1000 mg via INTRAVENOUS
  Filled 2022-01-10: qty 50

## 2022-01-10 MED ORDER — POTASSIUM CHLORIDE CRYS ER 20 MEQ PO TBCR
40.0000 meq | EXTENDED_RELEASE_TABLET | Freq: Once | ORAL | Status: AC
  Administered 2022-01-10: 40 meq via ORAL
  Filled 2022-01-10: qty 2

## 2022-01-10 MED ORDER — HYDRALAZINE HCL 50 MG PO TABS
50.0000 mg | ORAL_TABLET | Freq: Three times a day (TID) | ORAL | Status: DC
  Administered 2022-01-10 – 2022-01-11 (×3): 50 mg via ORAL
  Filled 2022-01-10 (×3): qty 1

## 2022-01-10 MED ORDER — AMLODIPINE BESYLATE 10 MG PO TABS
10.0000 mg | ORAL_TABLET | Freq: Every day | ORAL | Status: DC
  Administered 2022-01-11: 10 mg via ORAL
  Filled 2022-01-10: qty 1

## 2022-01-10 NOTE — Progress Notes (Signed)
[facility] Progress Note  Subjective:  He had 950 mL UOP over 6/21 as well as 4 unmeasured urine voids.  He's glad to hear about being able to go home and he and I discussed importance of close follow-up   Review of systems:    Denies n/v today Denies chest pain  Denies shortness of breath  Supplemental hx:  He last saw Dr. Hollie Salk on 5/5.  There was a delay initially in getting rituxan outpatient as he hadn't signed the paperwork but he did get one dose.  Cr on 11/23/21 was 2.37 and EGFR 38.   Vitals:   01/10/22 0100 01/10/22 0500 01/10/22 0519 01/10/22 1302  BP: (!) 154/108  (!) 156/114 (!) 159/109  Pulse:   73 69  Resp:   16   Temp:   99.3 F (37.4 C) 97.8 F (36.6 C)  TempSrc:   Oral Oral  SpO2:   100% 100%  Weight:  92.9 kg    Height:        Physical Exam:     Gen adult male in no distress HEENT NCAT Neck supple trachea midline Chest clear bilat to bases S1S2 no rub Abd soft ntnd Ext - 1+ edema  Neuro is alert, Ox 3 , follows commands and provides hx Psych normal mood and affect     CT abd / chest - LLL consolidation better than last imaging, +some ascites and anasarca.   CT abd - Adrenals/Urinary Tract: Normal adrenal glands. Symmetric, nonobstructed kidneys. No nephrolithiasis. Ureters appear nondilated. Unremarkable bladder.  CXr - no edema    Home meds include - aspirin, losartan 25 qd, atorvastatin, ezetimibe, gabapentin, torsemide 80 bid      Date                          Creat               eGFR    2019- 2020               0.9- 1.0    2021                         2.42 >> 0.95    AKI episode    March 2022              3.10 >> 1.88    Aki, ckd     07/30/21                     2.29                 40 ml/min    1/24- 08/25/21              6.41 >> 1.85    Aki/ ckd, 12- 52 ml/min    5/11- 12/05/21            2.23 >> 1.80    Aki/ ckd,  39- 53 ml/min    6/16                          2.47         6/17                          2.52  35  ml/min        Assessment/ Plan: AKI on CKD 3. FSG w/ severe neph syndrome. Creat here 2.5 on admission 6/16 in setting of PNA and sepsis. hypertensive on admit.  UA mild rbc/ wbcs and heavy proteinuria. CT shows normal kidneys w/o obstruction.  Creatinine has plateaued  hold losartan as you are (including on discharge - we can assess on follow-up) Continue torsemide 40 mg PO BID (please note that he will need a prescription for the torsemide as was on lasix before).  Sent message to primary team He is due for rituxan but we are post-poning dose due to PNA.  He will need to discuss this at nephrology hospital follow-up  Vol overload - May 2023 admit was diuresed from 212 lbs to 182 lbs. Diuretics as above.  Discussed fluid restrictions with pt. As above  transition to torsemide   PNA -CAP - w/ cough, fever and mild LLL consolidation by CT. Got a dose of IV vanc but then transitioned to azithro/ rocephin IV. Abx per primary team (has completed course)  CKD stage 3 - baseline Cr 1.8 - 2.3 From Jan - May 2023 Anemia CKD - iron deficiency - iron sat was not calculated.  S/p IV iron x 2 doses.  S/p aranesp 40 mcg once on 6/21  Hypokalemia - s/p repletion with 40 meq once this am. Would start potassium 20 meq PO daily on discharge    Disposition - I will set up follow-up with [facility] in 1 week.  Stable for discharge home today from a renal standpoint     Recent Labs  Lab 01/08/22 0453 01/09/22 0553 01/10/22 0507  HGB 8.2* 8.3*  --   ALBUMIN <1.5* <1.5*  --   CALCIUM 6.5* 6.7* 6.7*  CREATININE 3.41* 3.54* 3.45*  K 3.5 3.4* 3.4*   Inpatient medications:  [START ON 01/11/2022] amLODipine  10 mg Oral Daily   atorvastatin  80 mg Oral Daily   enoxaparin (LOVENOX) injection  40 mg Subcutaneous Q24H   hydrALAZINE  50 mg Oral Q8H   torsemide  40 mg Oral BID     acetaminophen **OR** acetaminophen, albuterol, hydrALAZINE, ondansetron **OR** ondansetron (ZOFRAN) IV, oxyCODONE-acetaminophen  **OR** oxyCODONE-acetaminophen, polyethylene glycol   Claudia Desanctis, MD 01/10/2022 3:46 PM

## 2022-01-10 NOTE — Progress Notes (Signed)
BP was 151/110 at 2043. RN administered PRN hydralazine dose and BP 152/104 mmHg at 2230. BP 159/113 mmHg at 2330. On-call provider notified, recommended to continue monitoring for time being.

## 2022-01-10 NOTE — Progress Notes (Signed)
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 Pt also found to be septic with PNA  Assessment & Plan:   Principal Problem:   Sepsis due to pneumonia [facility]) Active Problems:   Acute renal failure superimposed on stage 3a chronic kidney disease (HCC)   Pneumonia of left lower lobe due to infectious organism   Anasarca associated with disorder of kidney   Mixed hyperlipidemia   Anasarca  Sepsis due to pneumonia POA: Patient presenting with evidence of tachycardia, fever and leukocytosis in the setting of \ multifocal left lower lobe pneumonia pneumonia on CT. completed 5 days of IV Rocephin and Zithromax on 01/09/2022.  Blood cultures negative.  Acute renal failure superimposed on stage 3a ckd in the setting of FSG with severe nephrotic syndrome: Baseline creatinine around 1.8.  Presented with 2.5 which rose further at 3.5 and now has plateaued.  Nephrology managing.  He has been started on torsemide on 01/09/2022.  Hypertensive urgency: Patient's diastolic blood pressure has remained very high and peaked at 120 at some point in time.  Started on low-dose amlodipine yesterday, blood pressure still significantly elevated, will increase amlodipine to 10 mg and start on hydralazine 50 mg every 8 hours.  Hypokalemia: 3.4 again today.  Will replace.   Anasarca associated with disorder of kidney: Substantial anasarca noted on exam with pitting edema from the feet all the way through to the head.  Management as mentioned above.   Mixed hyperlipidemia: Continue atorvastatin.  DVT prophylaxis: enoxaparin (LOVENOX) injection 40 mg  Start: 01/05/22 1000   Code Status: Full Code  Family Communication:  None present at bedside.  Plan of care discussed with patient in length and he/she verbalized understanding and agreed with it.  Status is: Inpatient Remains inpatient appropriate because: Creatinine is still elevated and has uncontrolled blood pressure that needs to be controlled.   Estimated body mass index is 29.81 kg/m as calculated from the following:   Height as of this encounter: 5' 9.5" (1.765 m).   Weight as of this encounter: 92.9 kg.    Nutritional Assessment: Body mass index is 29.81 kg/m.Marland Kitchen Seen by dietician.  I agree with the assessment and plan as outlined below: Nutrition Status:        . Skin Assessment: I have examined the patient's skin and I agree with the wound assessment as performed by the wound care RN as outlined below:    Consultants:  Nephrology  Procedures:  None  Antimicrobials:  Anti-infectives (From admission, onward)    Start     Dose/Rate Route Frequency Ordered Stop   01/09/22 1230  cefTRIAXone (ROCEPHIN) 2 g in sodium chloride 0.9 % 100 mL IVPB        2 g 200 mL/hr over 30 Minutes Intravenous  Once 01/09/22 1218 01/09/22 1436   01/07/22 1000  azithromycin (ZITHROMAX) tablet 500 mg        500 mg Oral Daily 01/06/22 1120 01/09/22 0935   01/05/22 2200  vancomycin (VANCOREADY) IVPB 1250 mg/250 mL  Status:  Discontinued        1,250 mg 166.7 mL/hr over 90 Minutes Intravenous Every 24 hours 01/05/22 0125 01/05/22 0941   01/05/22 1200  ceFEPIme (MAXIPIME) 2 g in sodium chloride 0.9 % 100 mL IVPB  Status:  Discontinued        2 g 200 mL/hr over 30 Minutes Intravenous Every 12 hours 01/05/22 0035 01/05/22 0941   01/05/22 1200  cefTRIAXone (ROCEPHIN) 2 g in sodium chloride 0.9 % 100 mL IVPB        2 g 200 mL/hr over 30 Minutes Intravenous Every 24 hours 01/05/22 0941 01/09/22 1436   01/05/22 1030  azithromycin (ZITHROMAX) 500 mg in sodium chloride 0.9 % 250 mL IVPB   Status:  Discontinued        500 mg 250 mL/hr over 60 Minutes Intravenous Every 24 hours 01/05/22 0941 01/06/22 1120   01/05/22 0035  vancomycin variable dose per unstable renal function (pharmacist dosing)  Status:  Discontinued         Does not apply See admin instructions 01/05/22 0035 01/05/22 0125   01/05/22 0015  ceFEPIme (MAXIPIME) 2 g in sodium chloride 0.9 % 100 mL IVPB        2 g 200 mL/hr over 30 Minutes Intravenous  Once 01/05/22 0003 01/05/22 0053   01/05/22 0015  metroNIDAZOLE (FLAGYL) IVPB 500 mg        500 mg 100 mL/hr over 60 Minutes Intravenous  Once 01/05/22 0003 01/05/22 0700   01/05/22 0015  vancomycin (VANCOCIN) IVPB 1000 mg/200 mL premix        1,000 mg 200 mL/hr over 60 Minutes Intravenous  Once 01/05/22 0003 01/05/22 0116         Subjective:  Patient seen and examined.  He has no complaints.  He says he wants to go home.  Objective: Vitals:   01/10/22 0100 01/10/22 0500 01/10/22 0519 01/10/22 1302  BP: (!) 154/108  (!) 156/114 (!) 159/109  Pulse:   73 69  Resp:   16   Temp:   99.3 F (37.4 C) 97.8 F (36.6 C)  TempSrc:   Oral Oral  SpO2:   100% 100%  Weight:  92.9 kg    Height:        Intake/Output Summary (Last 24 hours) at 01/10/2022 1319 Last data filed at 01/10/2022 1042 Gross per 24 hour  Intake 1671.25 ml  Output 675 ml  Net 996.25 ml    Filed Weights   01/07/22 0500 01/09/22 0605 01/10/22 0500  Weight: 93 kg 92.7 kg 92.9 kg    Examination:  General exam: Appears calm and comfortable, facial and periorbital edema Respiratory system: Clear to auscultation. Respiratory effort normal. Cardiovascular system: S1 & S2 heard, RRR. No JVD, murmurs, rubs, gallops or clicks.  +2 pitting edema bilateral lower extremity. Gastrointestinal system: Abdomen is nondistended, soft and nontender. No organomegaly or masses felt. Normal bowel sounds heard. Central nervous system: Alert and oriented. No focal neurological deficits. Extremities:  Symmetric 5 x 5 power. Skin: No rashes, lesions or ulcers.     Data Reviewed: I have personally reviewed following labs and imaging studies  CBC: Recent Labs  Lab 01/04/22 2128 01/05/22 0526 01/06/22 0459 01/07/22 0353 01/08/22 0453 01/09/22 0553  WBC 15.2* 12.2* 7.7 7.8 7.1 7.4  NEUTROABS 13.2* 10.1*  --   --   --   --   HGB 8.9* 8.6* 8.0* 8.3* 8.2* 8.3*  HCT 26.4* 25.3* 23.8* 24.7* 24.4* 24.5*  MCV 83.8 84.1 84.7 84.9 83.8 82.2  PLT 379 295 294 313 333 867    Basic Metabolic Panel: Recent Labs  Lab 01/05/22 0526 01/06/22 0459 01/07/22 0353  01/08/22 0453 01/09/22 0553 01/10/22 0507  NA 139 138 142 141 143 140  K 3.5 3.7 3.6 3.5 3.4* 3.4*  CL 114* 111 113* 113* 114* 112*  CO2 22 23 26 25 25 25   GLUCOSE 103* 89 97 108* 92 98  BUN 29* 30* 32* 32* 33* 30*  CREATININE 2.52* 3.21* 3.45* 3.41* 3.54* 3.45*  CALCIUM 6.6* 6.6* 6.6* 6.5* 6.7* 6.7*  MG 1.8  --   --   --   --   --     GFR: Estimated Creatinine Clearance: 37.2 mL/min (A) (by C-G formula based on SCr of 3.45 mg/dL (H)). Liver Function Tests: Recent Labs  Lab 01/05/22 0526 01/06/22 0459 01/07/22 0353 01/08/22 0453 01/09/22 0553  AST 17 15 15  12* 13*  ALT 10 8 9 7 7   ALKPHOS 59 51 50 48 49  BILITOT 0.7 0.3 0.4 0.3 0.4  PROT 3.4* 3.1* <3.0* 3.1* 3.4*  ALBUMIN <1.5* <1.5* <1.5* <1.5* <1.5*    No results for input(s): "LIPASE", "AMYLASE" in the last 168 hours. No results for input(s): "AMMONIA" in the last 168 hours. Coagulation Profile: Recent Labs  Lab 01/05/22 0526  INR 1.2    Cardiac Enzymes: No results for input(s): "CKTOTAL", "CKMB", "CKMBINDEX", "TROPONINI" in the last 168 hours. BNP (last 3 results) No results for input(s): "PROBNP" in the last 8760 hours. HbA1C: No results for input(s): "HGBA1C" in the last 72 hours. CBG: No results for input(s): "GLUCAP" in the last 168 hours. Lipid Profile: No results for input(s): "CHOL", "HDL", "LDLCALC", "TRIG", "CHOLHDL", "LDLDIRECT" in the  last 72 hours. Thyroid Function Tests: No results for input(s): "TSH", "T4TOTAL", "FREET4", "T3FREE", "THYROIDAB" in the last 72 hours. Anemia Panel: Recent Labs    01/08/22 0453  FERRITIN 611*  TIBC NOT CALCULATED  IRON 22*    Sepsis Labs: Recent Labs  Lab 01/04/22 2226 01/05/22 0526 01/05/22 0711  PROCALCITON  --  0.66  --   LATICACIDVEN 1.2 0.6 0.5     Recent Results (from the past 240 hour(s))  Culture, blood (single)     Status: None   Collection Time: 01/04/22 10:30 PM   Specimen: BLOOD  Result Value Ref Range Status   Specimen Description   Final    BLOOD BLOOD LEFT HAND Performed at [facility], [place] [address] [place], [place] [address]    Special Requests   Final    BOTTLES DRAWN AEROBIC AND ANAEROBIC Blood Culture adequate volume Performed at [place] [address] [place], [place] [address]    Culture   Final    NO GROWTH 5 DAYS Performed at [facility], [place] [address] [place], [place] [address]    Report Status 01/09/2022 FINAL  Final  Resp Panel by RT-PCR (Flu A&B, Covid) Anterior Nasal Swab     Status: None   Collection Time: 01/04/22 11:35 PM   Specimen: Anterior Nasal Swab  Result Value Ref Range Status   SARS Coronavirus 2 by RT PCR NEGATIVE NEGATIVE Final    Comment: (NOTE) SARS-CoV-2 target nucleic acids are NOT DETECTED.  The SARS-CoV-2 RNA is generally detectable in upper respiratory specimens during the acute phase of infection. The lowest concentration of SARS-CoV-2 viral copies this assay can detect is 138 copies/mL. A negative result does not preclude SARS-Cov-2 infection and should not be used as the sole basis for treatment or other patient management decisions. A negative result may occur with  improper specimen collection/handling, submission of specimen  other than nasopharyngeal swab, presence of viral mutation(s) within the areas targeted by this assay, and inadequate number  of viral copies(<138 copies/mL). A negative result must be combined with clinical observations, patient history, and epidemiological information. The expected result is Negative.  Fact Sheet for Patients:  EntrepreneurPulse.com.au  Fact Sheet for Healthcare Providers:  IncredibleEmployment.be  This test is no t yet approved or cleared by the [place] FDA and  has been authorized for detection and/or diagnosis of SARS-CoV-2 by FDA under an Emergency Use Authorization (EUA). This EUA will remain  in effect (meaning this test can be used) for the duration of the COVID-19 declaration under Section 564(b)(1) of the Act, 21 U.S.C.section 360bbb-3(b)(1), unless the authorization is terminated  or revoked sooner.       Influenza A by PCR NEGATIVE NEGATIVE Final   Influenza B by PCR NEGATIVE NEGATIVE Final    Comment: (NOTE) The Xpert Xpress SARS-CoV-2/FLU/RSV plus assay is intended as an aid in the diagnosis of influenza from Nasopharyngeal swab specimens and should not be used as a sole basis for treatment. Nasal washings and aspirates are unacceptable for Xpert Xpress SARS-CoV-2/FLU/RSV testing.  Fact Sheet for Patients: EntrepreneurPulse.com.au  Fact Sheet for Healthcare Providers: IncredibleEmployment.be  This test is not yet approved or cleared by the [place] FDA and has been authorized for detection and/or diagnosis of SARS-CoV-2 by FDA under an Emergency Use Authorization (EUA). This EUA will remain in effect (meaning this test can be used) for the duration of the COVID-19 declaration under Section 564(b)(1) of the Act, 21 U.S.C. section 360bbb-3(b)(1), unless the authorization is terminated or revoked.  Performed at [facility], [place] [address] [place], [place] [address]   Culture, blood (x 2)     Status: None   Collection Time: 01/05/22  5:26 AM   Specimen: BLOOD   Result Value Ref Range Status   Specimen Description   Final    BLOOD BLOOD RIGHT HAND Performed at [place] [address], [place], [place] [address]    Special Requests   Final    BOTTLES DRAWN AEROBIC AND ANAEROBIC Blood Culture adequate volume Performed at [place] [address] [place], [place] [address]    Culture   Final    NO GROWTH 5 DAYS Performed at [facility], [place] [address] [place], [place] [address]    Report Status 01/10/2022 FINAL  Final  Culture, blood (x 2)     Status: None   Collection Time: 01/05/22  5:27 AM   Specimen: BLOOD  Result Value Ref Range Status   Specimen Description   Final    BLOOD LEFT ANTECUBITAL Performed at [place] [address], [place], [place] [address]    Special Requests   Final    BOTTLES DRAWN AEROBIC AND ANAEROBIC Blood Culture adequate volume Performed at [place] [address], [place], [place] [address]    Culture   Final    NO GROWTH 5 DAYS Performed at [facility], [place] [address], [place], [place] [address]    Report Status 01/10/2022 FINAL  Final     Radiology Studies: No results found.  Scheduled Meds:  [START ON 01/11/2022] amLODipine  10 mg Oral Daily   amLODipine  5 mg Oral Once   atorvastatin  80 mg Oral Daily   enoxaparin (LOVENOX) injection  40 mg Subcutaneous Q24H   hydrALAZINE  50 mg Oral Q8H  torsemide  40 mg Oral BID   Continuous Infusions:     LOS: 5 days   Darliss Cheney, MD [facility]  01/10/2022, 1:19 PM   *Please note that this is a verbal dictation therefore any spelling or grammatical errors are due to the "[place]" system interpretation.  Please page via [place] and do not message via secure chat for urgent patient care matters. Secure chat can be used for non urgent patient care matters.  How to contact the [facility] Attending or Consulting provider [place] or  covering provider during after hours [place], for this patient?  Check the care team in [facility] and look for a) attending/consulting [facility] provider listed and b) the [facility] team listed. Page or secure chat [phone]. Log into [email or web address] and use [place]'s universal password to access. If you do not have the password, please contact the hospital operator. Locate the [facility] provider you are looking for under [facility] and page to a number that you can be directly reached. If you still have difficulty reaching the provider, please page the [facility] (Director on Call) for the Hospitalists listed on amion for assistance.

## 2022-01-11 DIAGNOSIS — J189 Pneumonia, unspecified organism: Secondary | ICD-10-CM | POA: Diagnosis not present

## 2022-01-11 DIAGNOSIS — A419 Sepsis, unspecified organism: Secondary | ICD-10-CM | POA: Diagnosis not present

## 2022-01-11 LAB — BASIC METABOLIC PANEL
Anion gap: 6 (ref 5–15)
BUN: 30 mg/dL — ABNORMAL HIGH (ref 6–20)
CO2: 24 mmol/L (ref 22–32)
Calcium: 6.7 mg/dL — ABNORMAL LOW (ref 8.9–10.3)
Chloride: 111 mmol/L (ref 98–111)
Creatinine, Ser: 3.54 mg/dL — ABNORMAL HIGH (ref 0.61–1.24)
GFR, Estimated: 23 mL/min — ABNORMAL LOW (ref >60)
Glucose, Bld: 86 mg/dL (ref 70–99)
Potassium: 3.2 mmol/L — ABNORMAL LOW (ref 3.5–5.1)
Sodium: 141 mmol/L (ref 135–145)

## 2022-01-11 MED ORDER — AMLODIPINE BESYLATE 10 MG PO TABS: 10.0000 mg | ORAL_TABLET | Freq: Every day | ORAL | 0 refills | Status: AC

## 2022-01-11 MED ORDER — HYDRALAZINE HCL 100 MG PO TABS
100.0000 mg | ORAL_TABLET | Freq: Three times a day (TID) | ORAL | 0 refills | Status: AC
Start: 2022-01-11 — End: 2022-02-10

## 2022-01-11 MED ORDER — ORAL CARE MOUTH RINSE: 15.0000 mL | OROMUCOSAL | Status: DC | PRN

## 2022-01-11 MED ORDER — POTASSIUM CHLORIDE CRYS ER 20 MEQ PO TBCR: 20.0000 meq | EXTENDED_RELEASE_TABLET | Freq: Every day | ORAL | 0 refills | Status: AC

## 2022-01-11 MED ORDER — TORSEMIDE 40 MG PO TABS: 40.0000 mg | ORAL_TABLET | Freq: Two times a day (BID) | ORAL | 0 refills | Status: AC

## 2022-01-11 MED ORDER — HYDRALAZINE HCL 50 MG PO TABS
100.0000 mg | ORAL_TABLET | Freq: Three times a day (TID) | ORAL | Status: DC
  Administered 2022-01-11: 100 mg via ORAL
  Filled 2022-01-11: qty 2

## 2022-01-11 MED ORDER — POTASSIUM CHLORIDE CRYS ER 20 MEQ PO TBCR
40.0000 meq | EXTENDED_RELEASE_TABLET | ORAL | Status: AC
  Administered 2022-01-11 (×2): 40 meq via ORAL
  Filled 2022-01-11 (×2): qty 2

## 2022-01-29 ENCOUNTER — Other Ambulatory Visit (HOSPITAL_COMMUNITY): Payer: Self-pay

## 2022-01-29 ENCOUNTER — Encounter (HOSPITAL_COMMUNITY)
Admission: RE | Admit: 2022-01-29 | Discharge: 2022-01-29 | Disposition: A | Discharge status: Home / Self Care | Payer: Medicaid Other | Source: Ambulatory Visit | Attending: Nephrology | Admitting: Nephrology

## 2022-01-29 DIAGNOSIS — N049 Nephrotic syndrome with unspecified morphologic changes: Secondary | ICD-10-CM | POA: Insufficient documentation

## 2022-01-29 MED ORDER — ACETAMINOPHEN 325 MG PO TABS
ORAL_TABLET | ORAL | Status: AC
  Administered 2022-01-29: 650 mg via ORAL
  Filled 2022-01-29: qty 2

## 2022-01-29 MED ORDER — METHYLPREDNISOLONE SODIUM SUCC 125 MG IJ SOLR
INTRAMUSCULAR | Status: AC
  Administered 2022-01-29: 125 mg via INTRAVENOUS
  Filled 2022-01-29: qty 2

## 2022-01-29 MED ORDER — DIPHENHYDRAMINE HCL 50 MG/ML IJ SOLN
INTRAMUSCULAR | Status: AC
  Administered 2022-01-29: 25 mg via INTRAVENOUS
  Filled 2022-01-29: qty 1

## 2022-01-29 MED ORDER — SODIUM CHLORIDE 0.9 % IV SOLN
1000.0000 mg | Freq: Once | INTRAVENOUS | Status: AC
  Administered 2022-01-29: 1000 mg via INTRAVENOUS
  Filled 2022-01-29 (×2): qty 100

## 2022-01-29 MED ORDER — DIPHENHYDRAMINE HCL 50 MG/ML IJ SOLN: 25.0000 mg | Freq: Once | INTRAMUSCULAR | Status: AC

## 2022-01-29 MED ORDER — METHYLPREDNISOLONE SODIUM SUCC 125 MG IJ SOLR: 125.0000 mg | Freq: Once | INTRAMUSCULAR | Status: AC

## 2022-01-29 MED ORDER — ACETAMINOPHEN 325 MG PO TABS: 650.0000 mg | ORAL_TABLET | Freq: Once | ORAL | Status: AC

## 2022-01-29 MED ORDER — METHYLPREDNISOLONE SODIUM SUCC 125 MG IJ SOLR
INTRAMUSCULAR | Status: AC
  Filled 2022-01-29: qty 2

## 2022-07-10 ENCOUNTER — Inpatient Hospital Stay (HOSPITAL_COMMUNITY): Admit: 2022-07-10 | Payer: Medicaid Other

## 2022-07-31 ENCOUNTER — Inpatient Hospital Stay (HOSPITAL_COMMUNITY): Admission: RE | Admit: 2022-07-31 | Payer: Medicaid Other | Source: Ambulatory Visit

## 2023-01-29 ENCOUNTER — Inpatient Hospital Stay (HOSPITAL_COMMUNITY): Admission: RE | Admit: 2023-01-29 | Payer: Medicaid Other | Source: Ambulatory Visit

## 2023-02-04 IMAGING — DX DG CHEST 2V
2 series · 2 of 2 positions shown · non-contrast
Comparison: 09/10/2021

CLINICAL DATA: History of left-sided VATS for empyema. Evaluate for
pleural effusion.

EXAM:
CHEST - 2 VIEW

[dg chest 2 view (1 of 2)]
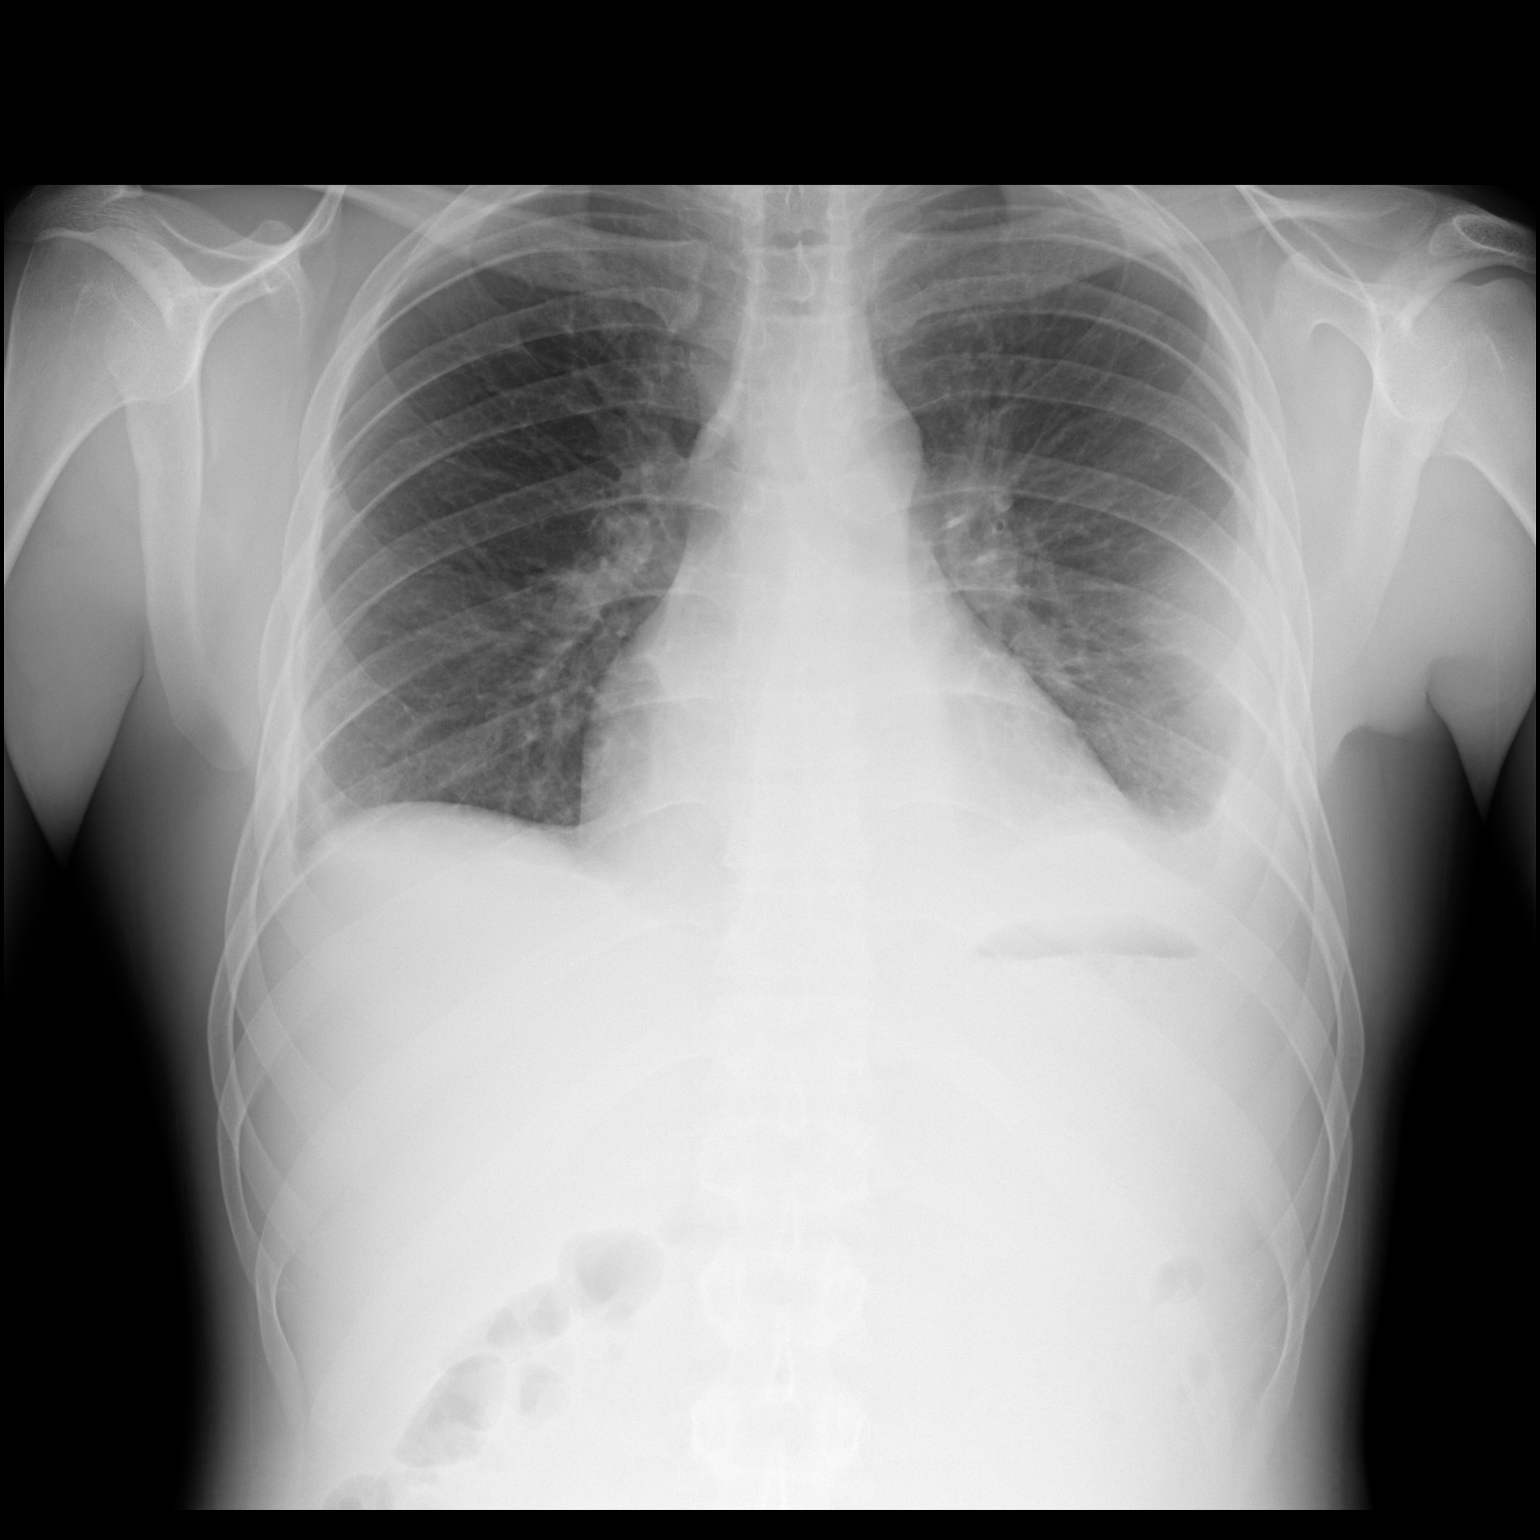

[dg chest 2 view (2 of 2)]
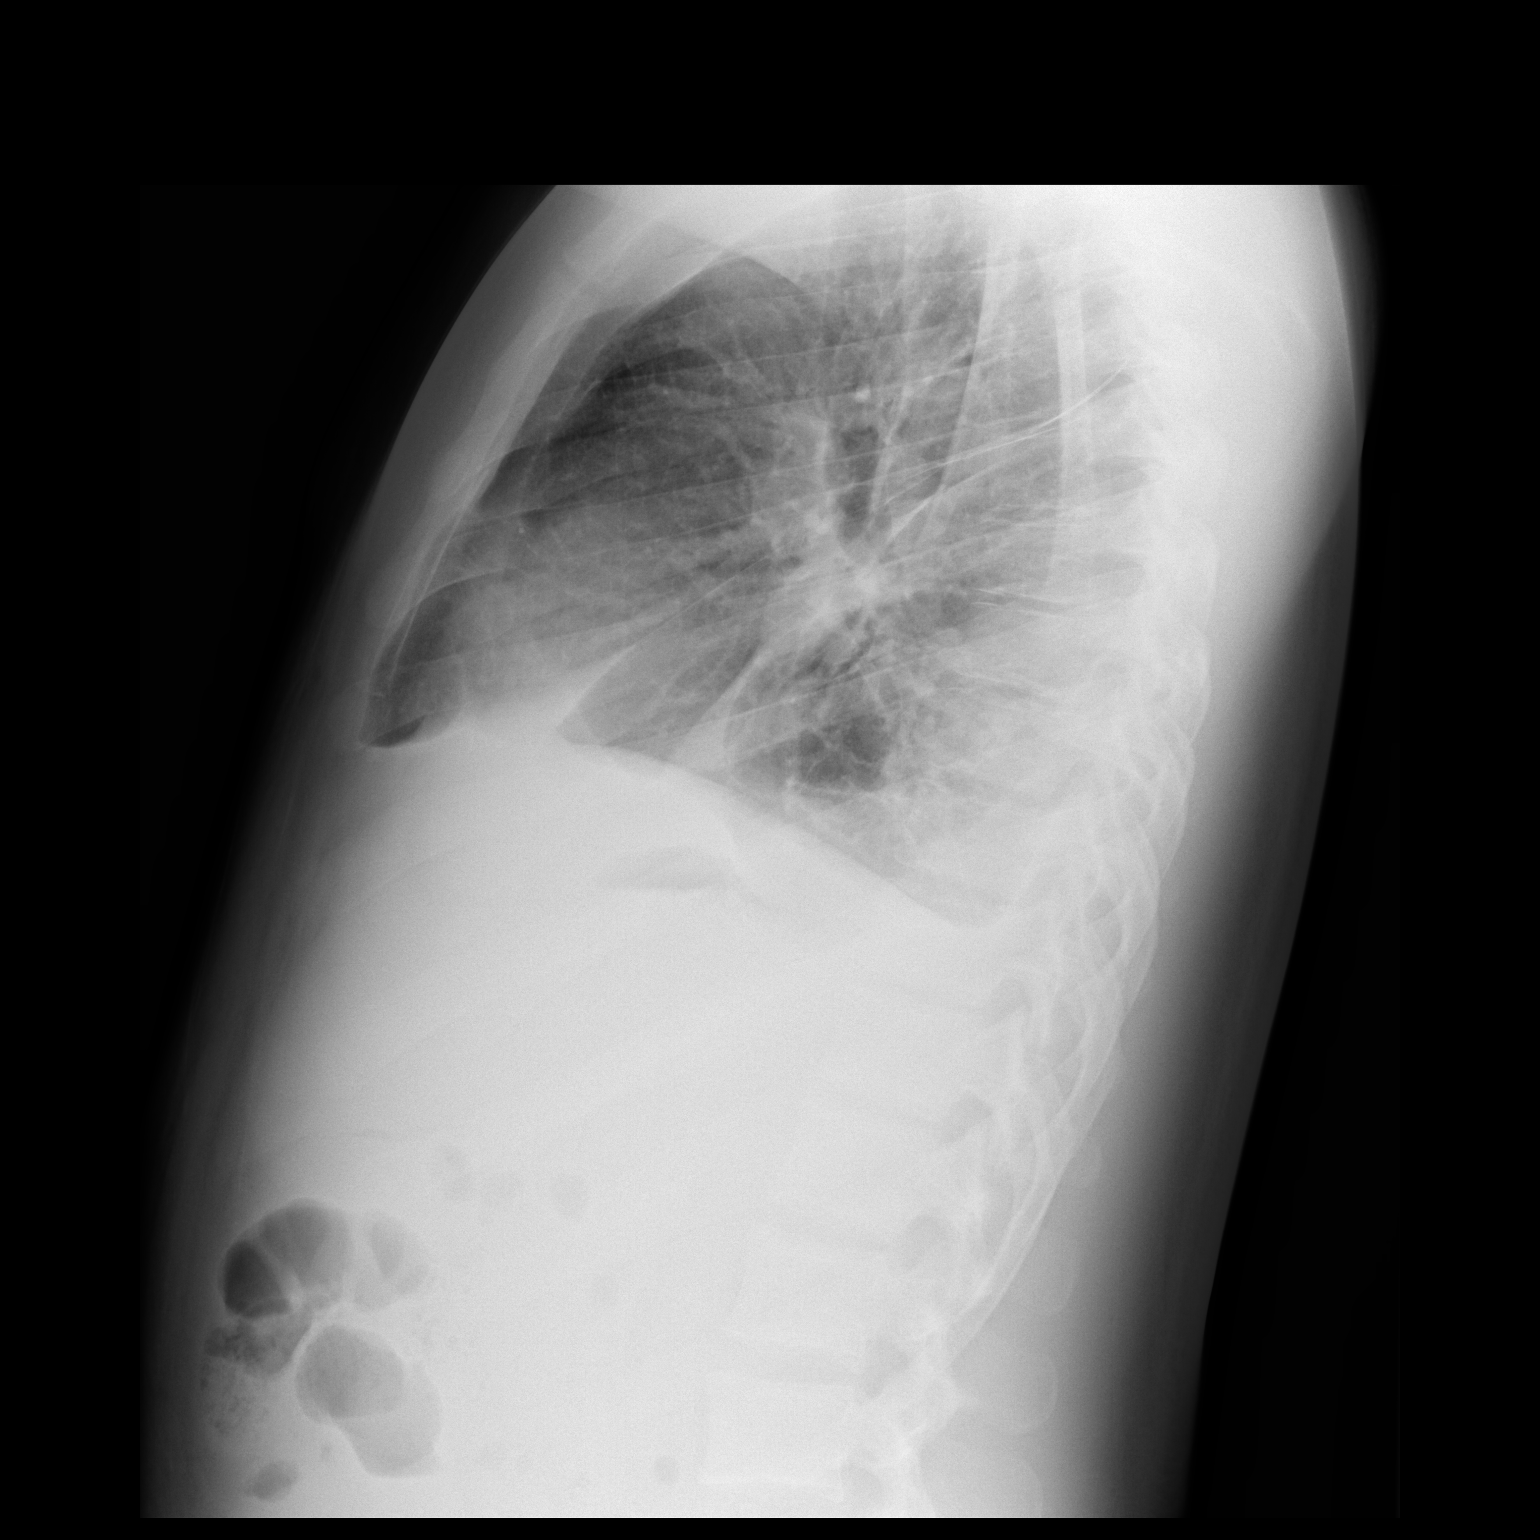

[2 of 2 positions shown; findings below may reference images not displayed]

FINDINGS: Midline trachea. Borderline cardiomegaly. Mediastinal contours
otherwise within normal limits. Again identified are small left and
trace right pleural effusions. Minimal right base atelectasis. Left
lower lobe airspace disease is slightly increased. No pneumothorax.
No congestive failure.
IMPRESSION: Similar small left and trace right pleural effusions.

Slight increase in left lower lobe Airspace disease, likely
atelectasis.
